# Patient Record
Sex: Female | Born: 1955
Health system: Southern US, Community
[De-identification: ages and names within clinical notes are randomized; demographics above are authoritative.]

## PROBLEM LIST (undated history)

## (undated) DIAGNOSIS — E785 Hyperlipidemia, unspecified: Secondary | ICD-10-CM

## (undated) DIAGNOSIS — I10 Essential (primary) hypertension: Secondary | ICD-10-CM

## (undated) DIAGNOSIS — K219 Gastro-esophageal reflux disease without esophagitis: Secondary | ICD-10-CM

## (undated) DIAGNOSIS — I313 Pericardial effusion (noninflammatory): Secondary | ICD-10-CM

## (undated) DIAGNOSIS — I350 Nonrheumatic aortic (valve) stenosis: Secondary | ICD-10-CM

## (undated) DIAGNOSIS — Z951 Presence of aortocoronary bypass graft: Secondary | ICD-10-CM

## (undated) DIAGNOSIS — I2510 Atherosclerotic heart disease of native coronary artery without angina pectoris: Secondary | ICD-10-CM

## (undated) DIAGNOSIS — I639 Cerebral infarction, unspecified: Secondary | ICD-10-CM

## (undated) DIAGNOSIS — Z952 Presence of prosthetic heart valve: Secondary | ICD-10-CM

## (undated) DIAGNOSIS — M109 Gout, unspecified: Secondary | ICD-10-CM

## (undated) DIAGNOSIS — I739 Peripheral vascular disease, unspecified: Secondary | ICD-10-CM

## (undated) DIAGNOSIS — J189 Pneumonia, unspecified organism: Secondary | ICD-10-CM

## (undated) DIAGNOSIS — I3139 Other pericardial effusion (noninflammatory): Secondary | ICD-10-CM

## (undated) DIAGNOSIS — Z9289 Personal history of other medical treatment: Secondary | ICD-10-CM

## (undated) DIAGNOSIS — E039 Hypothyroidism, unspecified: Secondary | ICD-10-CM

## (undated) DIAGNOSIS — Z9889 Other specified postprocedural states: Secondary | ICD-10-CM

## (undated) DIAGNOSIS — K922 Gastrointestinal hemorrhage, unspecified: Secondary | ICD-10-CM

## (undated) DIAGNOSIS — R011 Cardiac murmur, unspecified: Secondary | ICD-10-CM

## (undated) DIAGNOSIS — D509 Iron deficiency anemia, unspecified: Secondary | ICD-10-CM

## (undated) DIAGNOSIS — R0600 Dyspnea, unspecified: Secondary | ICD-10-CM

## (undated) DIAGNOSIS — K8020 Calculus of gallbladder without cholecystitis without obstruction: Secondary | ICD-10-CM

## (undated) DIAGNOSIS — Z87442 Personal history of urinary calculi: Secondary | ICD-10-CM

## (undated) DIAGNOSIS — Z7901 Long term (current) use of anticoagulants: Secondary | ICD-10-CM

## (undated) DIAGNOSIS — R7881 Bacteremia: Secondary | ICD-10-CM

## (undated) HISTORY — DX: Nonrheumatic aortic (valve) stenosis: I35.0

## (undated) HISTORY — DX: Cerebral infarction, unspecified: I63.9

## (undated) HISTORY — DX: Essential (primary) hypertension: I10

## (undated) HISTORY — DX: Other pericardial effusion (noninflammatory): I31.39

## (undated) HISTORY — PX: LUMBAR FUSION: SHX111

## (undated) HISTORY — DX: Presence of prosthetic heart valve: Z95.2

## (undated) HISTORY — DX: Hyperlipidemia, unspecified: E78.5

## (undated) HISTORY — DX: Gastrointestinal hemorrhage, unspecified: K92.2

## (undated) HISTORY — DX: Pericardial effusion (noninflammatory): I31.3

## (undated) HISTORY — DX: Long term (current) use of anticoagulants: Z79.01

## (undated) HISTORY — DX: Atherosclerotic heart disease of native coronary artery without angina pectoris: I25.10

## (undated) HISTORY — PX: BACK SURGERY: SHX140

## (undated) HISTORY — PX: CARDIAC VALVE REPLACEMENT: SHX585

## (undated) HISTORY — PX: TONSILLECTOMY: SUR1361

## (undated) HISTORY — PX: CARDIAC CATHETERIZATION: SHX172

## (undated) HISTORY — DX: Presence of aortocoronary bypass graft: Z95.1

## (undated) HISTORY — DX: Calculus of gallbladder without cholecystitis without obstruction: K80.20

---
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
## 1998-01-31 ENCOUNTER — Encounter: Admission: RE | Admit: 1998-01-31 | Discharge: 1998-01-31 | Payer: Self-pay | Admitting: Obstetrics

## 1998-02-05 ENCOUNTER — Ambulatory Visit (HOSPITAL_COMMUNITY): Admission: RE | Admit: 1998-02-05 | Discharge: 1998-02-05 | Payer: Self-pay | Admitting: Obstetrics

## 1998-02-28 ENCOUNTER — Encounter: Admission: RE | Admit: 1998-02-28 | Discharge: 1998-02-28 | Payer: Self-pay | Admitting: Obstetrics

## 2001-03-31 ENCOUNTER — Encounter: Payer: Self-pay | Admitting: Emergency Medicine

## 2001-03-31 ENCOUNTER — Emergency Department (HOSPITAL_COMMUNITY): Admission: EM | Admit: 2001-03-31 | Discharge: 2001-03-31 | Payer: Self-pay | Admitting: Emergency Medicine

## 2004-07-13 DIAGNOSIS — I739 Peripheral vascular disease, unspecified: Secondary | ICD-10-CM

## 2004-07-13 DIAGNOSIS — I639 Cerebral infarction, unspecified: Secondary | ICD-10-CM

## 2004-07-13 HISTORY — PX: AORTIC VALVE REPLACEMENT: SHX41

## 2004-07-13 HISTORY — DX: Cerebral infarction, unspecified: I63.9

## 2004-07-13 HISTORY — DX: Peripheral vascular disease, unspecified: I73.9

## 2004-07-13 HISTORY — PX: CORONARY ARTERY BYPASS GRAFT: SHX141

## 2004-11-05 ENCOUNTER — Ambulatory Visit: Payer: Self-pay | Admitting: Cardiology

## 2004-11-05 ENCOUNTER — Inpatient Hospital Stay (HOSPITAL_COMMUNITY): Admission: EM | Admit: 2004-11-05 | Discharge: 2004-11-11 | Payer: Self-pay | Admitting: Emergency Medicine

## 2004-11-06 ENCOUNTER — Encounter (INDEPENDENT_AMBULATORY_CARE_PROVIDER_SITE_OTHER): Payer: Self-pay | Admitting: *Deleted

## 2004-11-07 ENCOUNTER — Encounter: Payer: Self-pay | Admitting: Cardiology

## 2004-11-12 ENCOUNTER — Ambulatory Visit: Payer: Self-pay | Admitting: *Deleted

## 2004-11-17 ENCOUNTER — Ambulatory Visit: Payer: Self-pay | Admitting: *Deleted

## 2004-11-20 ENCOUNTER — Encounter: Admission: RE | Admit: 2004-11-20 | Discharge: 2004-11-27 | Payer: Self-pay | Admitting: Neurology

## 2004-12-01 ENCOUNTER — Ambulatory Visit: Payer: Self-pay | Admitting: Cardiology

## 2004-12-10 ENCOUNTER — Ambulatory Visit: Payer: Self-pay | Admitting: Cardiology

## 2004-12-28 ENCOUNTER — Emergency Department (HOSPITAL_COMMUNITY): Admission: EM | Admit: 2004-12-28 | Discharge: 2004-12-28 | Payer: Self-pay | Admitting: Emergency Medicine

## 2005-01-09 ENCOUNTER — Ambulatory Visit: Payer: Self-pay | Admitting: Cardiology

## 2005-01-27 ENCOUNTER — Ambulatory Visit: Payer: Self-pay | Admitting: Cardiology

## 2005-03-10 ENCOUNTER — Encounter: Admission: AD | Admit: 2005-03-10 | Discharge: 2005-03-10 | Payer: Self-pay | Admitting: Dentistry

## 2005-03-10 ENCOUNTER — Ambulatory Visit: Payer: Self-pay | Admitting: Dentistry

## 2005-03-12 ENCOUNTER — Encounter: Admission: RE | Admit: 2005-03-12 | Discharge: 2005-03-12 | Payer: Self-pay | Admitting: Cardiothoracic Surgery

## 2005-03-13 ENCOUNTER — Ambulatory Visit: Payer: Self-pay | Admitting: Cardiology

## 2005-03-17 ENCOUNTER — Ambulatory Visit: Payer: Self-pay | Admitting: Cardiology

## 2005-03-20 ENCOUNTER — Ambulatory Visit: Payer: Self-pay | Admitting: Cardiology

## 2005-03-23 ENCOUNTER — Inpatient Hospital Stay (HOSPITAL_COMMUNITY): Admission: AD | Admit: 2005-03-23 | Discharge: 2005-04-10 | Payer: Self-pay | Admitting: Surgery

## 2005-03-23 ENCOUNTER — Ambulatory Visit: Payer: Self-pay | Admitting: Cardiology

## 2005-03-23 ENCOUNTER — Ambulatory Visit: Payer: Self-pay | Admitting: Dentistry

## 2005-03-25 ENCOUNTER — Encounter (INDEPENDENT_AMBULATORY_CARE_PROVIDER_SITE_OTHER): Payer: Self-pay | Admitting: *Deleted

## 2005-03-31 ENCOUNTER — Encounter: Payer: Self-pay | Admitting: Cardiology

## 2005-04-20 ENCOUNTER — Ambulatory Visit: Payer: Self-pay | Admitting: Nurse Practitioner

## 2005-04-29 ENCOUNTER — Ambulatory Visit: Payer: Self-pay | Admitting: Dentistry

## 2005-05-21 ENCOUNTER — Ambulatory Visit: Payer: Self-pay | Admitting: Cardiology

## 2005-05-25 ENCOUNTER — Ambulatory Visit: Payer: Self-pay | Admitting: Dentistry

## 2005-06-03 ENCOUNTER — Ambulatory Visit: Payer: Self-pay | Admitting: Cardiology

## 2005-06-17 ENCOUNTER — Ambulatory Visit: Payer: Self-pay | Admitting: *Deleted

## 2005-07-01 ENCOUNTER — Ambulatory Visit: Payer: Self-pay | Admitting: Dentistry

## 2005-07-03 ENCOUNTER — Ambulatory Visit: Payer: Self-pay | Admitting: Internal Medicine

## 2005-07-20 ENCOUNTER — Ambulatory Visit: Payer: Self-pay | Admitting: Cardiology

## 2005-07-22 ENCOUNTER — Ambulatory Visit: Payer: Self-pay | Admitting: Dentistry

## 2005-08-10 ENCOUNTER — Ambulatory Visit: Payer: Self-pay | Admitting: Cardiology

## 2005-08-20 ENCOUNTER — Ambulatory Visit: Payer: Self-pay | Admitting: *Deleted

## 2005-08-28 ENCOUNTER — Ambulatory Visit: Payer: Self-pay | Admitting: Cardiovascular Disease

## 2005-08-28 ENCOUNTER — Ambulatory Visit: Payer: Self-pay | Admitting: Cardiology

## 2005-09-11 ENCOUNTER — Encounter: Payer: Self-pay | Admitting: Cardiology

## 2005-09-11 ENCOUNTER — Ambulatory Visit: Payer: Self-pay | Admitting: Internal Medicine

## 2005-09-11 ENCOUNTER — Ambulatory Visit: Payer: Self-pay

## 2005-09-14 ENCOUNTER — Ambulatory Visit: Payer: Self-pay | Admitting: Dentistry

## 2005-09-18 ENCOUNTER — Ambulatory Visit: Payer: Self-pay | Admitting: Internal Medicine

## 2005-10-05 ENCOUNTER — Ambulatory Visit: Payer: Self-pay | Admitting: Internal Medicine

## 2005-10-26 ENCOUNTER — Ambulatory Visit: Payer: Self-pay | Admitting: Internal Medicine

## 2005-11-23 ENCOUNTER — Ambulatory Visit: Payer: Self-pay | Admitting: Cardiology

## 2005-12-14 ENCOUNTER — Ambulatory Visit: Payer: Self-pay | Admitting: Cardiology

## 2005-12-25 ENCOUNTER — Ambulatory Visit: Payer: Self-pay | Admitting: Cardiology

## 2006-01-15 ENCOUNTER — Ambulatory Visit: Payer: Self-pay | Admitting: Internal Medicine

## 2006-01-19 ENCOUNTER — Ambulatory Visit: Payer: Self-pay | Admitting: Dentistry

## 2006-01-28 ENCOUNTER — Ambulatory Visit: Payer: Self-pay | Admitting: Cardiology

## 2006-01-28 ENCOUNTER — Other Ambulatory Visit: Admission: RE | Admit: 2006-01-28 | Discharge: 2006-01-28 | Payer: Self-pay | Admitting: Internal Medicine

## 2006-02-15 ENCOUNTER — Encounter: Admission: RE | Admit: 2006-02-15 | Discharge: 2006-02-15 | Payer: Self-pay | Admitting: Internal Medicine

## 2006-02-15 ENCOUNTER — Ambulatory Visit: Payer: Self-pay | Admitting: Internal Medicine

## 2006-03-16 ENCOUNTER — Ambulatory Visit: Payer: Self-pay | Admitting: Cardiovascular Disease

## 2006-04-13 ENCOUNTER — Ambulatory Visit: Payer: Self-pay | Admitting: *Deleted

## 2006-05-11 ENCOUNTER — Ambulatory Visit: Payer: Self-pay | Admitting: Cardiology

## 2006-06-07 ENCOUNTER — Ambulatory Visit: Payer: Self-pay | Admitting: Cardiology

## 2006-07-02 ENCOUNTER — Ambulatory Visit: Payer: Self-pay | Admitting: Cardiovascular Disease

## 2006-07-30 ENCOUNTER — Ambulatory Visit: Payer: Self-pay | Admitting: Cardiovascular Disease

## 2006-08-30 ENCOUNTER — Ambulatory Visit: Payer: Self-pay | Admitting: Internal Medicine

## 2006-09-20 ENCOUNTER — Ambulatory Visit: Payer: Self-pay | Admitting: Cardiology

## 2006-10-18 ENCOUNTER — Ambulatory Visit: Payer: Self-pay | Admitting: Cardiology

## 2006-11-15 ENCOUNTER — Ambulatory Visit: Payer: Self-pay | Admitting: Cardiovascular Disease

## 2006-12-17 ENCOUNTER — Ambulatory Visit: Payer: Self-pay | Admitting: Cardiology

## 2007-01-17 ENCOUNTER — Ambulatory Visit: Payer: Self-pay | Admitting: Cardiovascular Disease

## 2007-01-31 ENCOUNTER — Ambulatory Visit: Payer: Self-pay | Admitting: Cardiology

## 2007-02-28 ENCOUNTER — Ambulatory Visit: Payer: Self-pay | Admitting: Cardiology

## 2007-03-03 ENCOUNTER — Encounter: Admission: RE | Admit: 2007-03-03 | Discharge: 2007-03-03 | Payer: Self-pay | Admitting: Internal Medicine

## 2007-03-17 ENCOUNTER — Encounter: Admission: RE | Admit: 2007-03-17 | Discharge: 2007-03-17 | Payer: Self-pay | Admitting: Internal Medicine

## 2007-03-28 ENCOUNTER — Ambulatory Visit: Payer: Self-pay | Admitting: Internal Medicine

## 2007-04-25 ENCOUNTER — Ambulatory Visit: Payer: Self-pay | Admitting: Cardiology

## 2007-05-23 ENCOUNTER — Ambulatory Visit: Payer: Self-pay | Admitting: Internal Medicine

## 2007-06-20 ENCOUNTER — Ambulatory Visit: Payer: Self-pay | Admitting: Internal Medicine

## 2007-07-18 ENCOUNTER — Ambulatory Visit: Payer: Self-pay | Admitting: Cardiology

## 2007-08-15 ENCOUNTER — Ambulatory Visit: Payer: Self-pay | Admitting: Internal Medicine

## 2007-09-15 ENCOUNTER — Ambulatory Visit: Payer: Self-pay | Admitting: Cardiovascular Disease

## 2007-10-03 ENCOUNTER — Encounter: Admission: RE | Admit: 2007-10-03 | Discharge: 2007-10-03 | Payer: Self-pay | Admitting: Internal Medicine

## 2007-10-03 ENCOUNTER — Ambulatory Visit: Payer: Self-pay | Admitting: Cardiology

## 2007-10-31 ENCOUNTER — Ambulatory Visit: Payer: Self-pay | Admitting: Cardiology

## 2007-11-28 ENCOUNTER — Ambulatory Visit: Payer: Self-pay | Admitting: Cardiology

## 2007-12-19 ENCOUNTER — Ambulatory Visit: Payer: Self-pay | Admitting: Cardiology

## 2008-01-25 ENCOUNTER — Ambulatory Visit: Payer: Self-pay | Admitting: Cardiology

## 2008-02-22 ENCOUNTER — Ambulatory Visit: Payer: Self-pay | Admitting: Cardiology

## 2008-03-14 ENCOUNTER — Ambulatory Visit: Payer: Self-pay | Admitting: Internal Medicine

## 2008-04-11 ENCOUNTER — Ambulatory Visit: Payer: Self-pay | Admitting: Cardiology

## 2008-04-25 ENCOUNTER — Encounter: Payer: Self-pay | Admitting: Cardiology

## 2008-04-25 ENCOUNTER — Ambulatory Visit: Payer: Self-pay

## 2008-04-25 ENCOUNTER — Encounter: Admission: RE | Admit: 2008-04-25 | Discharge: 2008-04-25 | Payer: Self-pay | Admitting: Internal Medicine

## 2008-05-09 ENCOUNTER — Ambulatory Visit: Payer: Self-pay | Admitting: Cardiology

## 2008-06-11 ENCOUNTER — Ambulatory Visit: Payer: Self-pay | Admitting: Cardiovascular Disease

## 2008-07-09 ENCOUNTER — Ambulatory Visit: Payer: Self-pay | Admitting: Cardiology

## 2008-09-12 ENCOUNTER — Ambulatory Visit: Payer: Self-pay | Admitting: Internal Medicine

## 2008-10-15 ENCOUNTER — Ambulatory Visit: Payer: Self-pay | Admitting: Cardiology

## 2008-11-12 ENCOUNTER — Ambulatory Visit: Payer: Self-pay | Admitting: Cardiology

## 2008-12-11 ENCOUNTER — Encounter: Payer: Self-pay | Admitting: *Deleted

## 2008-12-11 ENCOUNTER — Ambulatory Visit: Payer: Self-pay | Admitting: Cardiology

## 2008-12-11 LAB — CONVERTED CEMR LAB: POC INR: 2.2

## 2009-01-10 ENCOUNTER — Ambulatory Visit: Payer: Self-pay | Admitting: Cardiology

## 2009-01-16 ENCOUNTER — Encounter: Payer: Self-pay | Admitting: *Deleted

## 2009-01-31 ENCOUNTER — Other Ambulatory Visit: Admission: RE | Admit: 2009-01-31 | Discharge: 2009-01-31 | Payer: Self-pay | Admitting: Internal Medicine

## 2009-02-04 ENCOUNTER — Encounter: Payer: Self-pay | Admitting: Cardiology

## 2009-03-11 ENCOUNTER — Encounter (INDEPENDENT_AMBULATORY_CARE_PROVIDER_SITE_OTHER): Payer: Self-pay | Admitting: *Deleted

## 2009-03-15 ENCOUNTER — Ambulatory Visit: Payer: Self-pay | Admitting: Internal Medicine

## 2009-04-12 ENCOUNTER — Ambulatory Visit: Payer: Self-pay | Admitting: Internal Medicine

## 2009-04-12 LAB — CONVERTED CEMR LAB: POC INR: 2.3

## 2009-04-23 ENCOUNTER — Encounter: Payer: Self-pay | Admitting: Cardiology

## 2009-04-24 ENCOUNTER — Ambulatory Visit: Payer: Self-pay | Admitting: Cardiology

## 2009-04-26 ENCOUNTER — Encounter: Admission: RE | Admit: 2009-04-26 | Discharge: 2009-04-26 | Payer: Self-pay | Admitting: Internal Medicine

## 2009-05-10 ENCOUNTER — Ambulatory Visit: Payer: Self-pay | Admitting: Internal Medicine

## 2009-06-07 ENCOUNTER — Ambulatory Visit: Payer: Self-pay | Admitting: Internal Medicine

## 2009-06-07 LAB — CONVERTED CEMR LAB: POC INR: 2.3

## 2009-07-08 ENCOUNTER — Ambulatory Visit: Payer: Self-pay | Admitting: Internal Medicine

## 2009-07-08 ENCOUNTER — Encounter (INDEPENDENT_AMBULATORY_CARE_PROVIDER_SITE_OTHER): Payer: Self-pay | Admitting: Cardiology

## 2009-07-08 LAB — CONVERTED CEMR LAB: POC INR: 2.2

## 2009-08-05 ENCOUNTER — Ambulatory Visit: Payer: Self-pay | Admitting: Cardiology

## 2009-08-05 LAB — CONVERTED CEMR LAB: POC INR: 2.1

## 2009-09-02 ENCOUNTER — Ambulatory Visit: Payer: Self-pay | Admitting: Internal Medicine

## 2009-09-02 LAB — CONVERTED CEMR LAB: POC INR: 1.8

## 2009-09-23 ENCOUNTER — Ambulatory Visit: Payer: Self-pay | Admitting: Internal Medicine

## 2009-09-23 LAB — CONVERTED CEMR LAB: POC INR: 2.1

## 2009-10-21 ENCOUNTER — Ambulatory Visit: Payer: Self-pay | Admitting: Cardiovascular Disease

## 2009-11-18 ENCOUNTER — Ambulatory Visit: Payer: Self-pay | Admitting: Cardiology

## 2009-12-16 ENCOUNTER — Ambulatory Visit: Payer: Self-pay | Admitting: Cardiology

## 2010-01-17 ENCOUNTER — Ambulatory Visit: Payer: Self-pay | Admitting: Cardiology

## 2010-02-28 ENCOUNTER — Ambulatory Visit: Payer: Self-pay | Admitting: Cardiology

## 2010-03-28 ENCOUNTER — Ambulatory Visit: Payer: Self-pay | Admitting: Cardiology

## 2010-04-16 ENCOUNTER — Telehealth: Payer: Self-pay | Admitting: Cardiology

## 2010-04-16 ENCOUNTER — Ambulatory Visit: Payer: Self-pay | Admitting: Cardiology

## 2010-04-16 ENCOUNTER — Encounter: Payer: Self-pay | Admitting: Cardiology

## 2010-04-16 DIAGNOSIS — R0602 Shortness of breath: Secondary | ICD-10-CM

## 2010-04-28 ENCOUNTER — Encounter: Admission: RE | Admit: 2010-04-28 | Discharge: 2010-04-28 | Payer: Self-pay | Admitting: Internal Medicine

## 2010-04-29 ENCOUNTER — Ambulatory Visit: Payer: Self-pay | Admitting: Cardiology

## 2010-04-29 ENCOUNTER — Encounter: Payer: Self-pay | Admitting: Cardiology

## 2010-04-29 ENCOUNTER — Ambulatory Visit: Payer: Self-pay | Admitting: Internal Medicine

## 2010-04-29 ENCOUNTER — Ambulatory Visit (HOSPITAL_COMMUNITY): Admission: RE | Admit: 2010-04-29 | Discharge: 2010-04-29 | Payer: Self-pay | Admitting: Cardiology

## 2010-04-29 ENCOUNTER — Ambulatory Visit: Payer: Self-pay

## 2010-04-29 LAB — CONVERTED CEMR LAB: POC INR: 2.7

## 2010-05-05 ENCOUNTER — Telehealth: Payer: Self-pay | Admitting: Cardiology

## 2010-05-16 ENCOUNTER — Encounter: Payer: Self-pay | Admitting: Cardiology

## 2010-05-19 ENCOUNTER — Ambulatory Visit: Payer: Self-pay | Admitting: Cardiology

## 2010-05-20 ENCOUNTER — Encounter: Payer: Self-pay | Admitting: Cardiology

## 2010-05-30 ENCOUNTER — Ambulatory Visit: Payer: Self-pay | Admitting: Cardiology

## 2010-05-30 LAB — CONVERTED CEMR LAB: POC INR: 3

## 2010-06-27 ENCOUNTER — Ambulatory Visit: Payer: Self-pay | Admitting: Cardiology

## 2010-07-25 ENCOUNTER — Ambulatory Visit: Admission: RE | Admit: 2010-07-25 | Discharge: 2010-07-25 | Payer: Self-pay | Source: Home / Self Care

## 2010-08-04 ENCOUNTER — Encounter: Payer: Self-pay | Admitting: Internal Medicine

## 2010-08-12 NOTE — Medication Information (Signed)
Summary: rov/ewj  Anticoagulant Therapy  Managed by: Weston Brass, PharmD Referring MD: Shawnie Pons MD PCP: Nila Nephew, MD Supervising MD: Riley Kill MD, Maisie Fus Indication 1: CVA-stroke (ICD-436) Lab Used: [facility] [place] Site: [facility] INR POC 2.5 INR RANGE 2 - 3  Dietary changes: no    Health status changes: no    Bleeding/hemorrhagic complications: no    Recent/future hospitalizations: no    Any changes in medication regimen? no    Recent/future dental: no  Any missed doses?: no       Is patient compliant with meds? yes       Allergies: No Known Drug Allergies  Anticoagulation Management History:      The patient is taking warfarin and comes in today for a routine follow up visit.  Positive risk factors for bleeding include history of CVA/TIA.  Negative risk factors for bleeding include an age less than 71 years old.  The bleeding index is 'intermediate risk'.  Positive CHADS2 values include History of HTN and Prior Stroke/CVA/TIA.  Negative CHADS2 values include Age > 40 years old.  The start date was 11/07/2004.  Her last INR was 4.0 RATIO.  Anticoagulation responsible provider: Riley Kill MD, Maisie Fus.  INR POC: 2.5.  Cuvette Lot#: [id number].  Exp: 12/2010.    Anticoagulation Management Assessment/Plan:      The patient's current anticoagulation dose is Coumadin 3 mg tabs: Take as directed by Anticoagulation Clinic.  The target INR is 2 - 3.  The next INR is due 12/16/2009.  Anticoagulation instructions were given to patient.  Results were reviewed/authorized by Weston Brass, PharmD.  She was notified by Weston Brass PharmD.         Prior Anticoagulation Instructions: INR 2.2  Continue on same dosage 3mg  daily except 4.5mg  on Mondays, Wednesdays, and Fridays.  Recheck in 4 weeks.    Current Anticoagulation Instructions: INR 2.5  Continue same dose of 1 tablet every day except 1 1/2 tablets on Monday, Wednesday and Friday  Prescriptions: COUMADIN 3 MG TABS (WARFARIN SODIUM)  Take as directed by Anticoagulation Clinic  #45 Tablet x 3   Entered by:   Weston Brass PharmD   Authorized by:   Talitha Givens, MD, [facility]   Signed by:   Weston Brass PharmD on 11/18/2009   Method used:   [facility] [phone]* (retail)       [address]       [place], [place]  [id number]       Ph: [phone] or [phone]       Fax: [phone]   RxID:   [id number]

## 2010-08-12 NOTE — Miscellaneous (Signed)
  Clinical Lists Changes  Observations: Added new observation of PAST MED HX:  COUMADIN THERAPY (ICD-V58.61) HYPERTENSION (ICD-401.9) DYSLIPIDEMIA (ICD-272.4) CVA (ICD-434.91)..related to thrombosed aortic root aneurysm CORONARY ARTERY BYPASS GRAFT, HX OF (ICD-V45.81).Marland KitchenLIMA to LAD..SVG to Dx..03/2005 CAD (ICD-414.00) AORTIC VALVE REPLACEMENT, HX OF (ICD-V43.3)...03/2005.Marland KitchenMarland KitchenMarland KitchenBentall procedure.(.#21 [employer] mechanical valve conduit with re-implantation of coronaries.).Marland Kitchen / ..good valve function ..echo..04/2008  /   mechanical valve working well....echo...04/2010 AORTIC STENOSIS (ICD-424.1) EF  60%...echo  04/2008  /   EF 55-60%...echo...04/29/2010... Indigestion... October, 2011 Shortness of breath (05/16/2010 13:58) Added new observation of PRIMARY MD: Nila Nephew, MD (05/16/2010 13:58)       Past History:  Past Medical History:  COUMADIN THERAPY (ICD-V58.61) HYPERTENSION (ICD-401.9) DYSLIPIDEMIA (ICD-272.4) CVA (ICD-434.91)..related to thrombosed aortic root aneurysm CORONARY ARTERY BYPASS GRAFT, HX OF (ICD-V45.81).Marland KitchenLIMA to LAD..SVG to Dx..03/2005 CAD (ICD-414.00) AORTIC VALVE REPLACEMENT, HX OF (ICD-V43.3)...03/2005.Marland KitchenMarland KitchenMarland KitchenBentall procedure.(.#21 [employer] Jude mechanical valve conduit with re-implantation of coronaries.).Marland Kitchen / ..good valve function ..echo..04/2008  /   mechanical valve working well....echo...04/2010 AORTIC STENOSIS (ICD-424.1) EF  60%...echo  04/2008  /   EF 55-60%...echo...04/29/2010... Indigestion... October, 2011 Shortness of breath

## 2010-08-12 NOTE — Medication Information (Signed)
Summary: rov/sp  Anticoagulant Therapy  Managed by: Weston Brass, PharmD Referring MD: Shawnie Pons MD PCP: Nila Nephew, MD Supervising MD: Antoine Poche MD, Fayrene Fearing Indication 1: CVA-stroke (ICD-436) Lab Used: [facility] [place] Site: [facility] INR POC 1.8 INR RANGE 2 - 3  Dietary changes: yes       Details: increased salads; pt would like to continue eating salads throughout the summer  Health status changes: no    Bleeding/hemorrhagic complications: no    Recent/future hospitalizations: no    Any changes in medication regimen? no    Recent/future dental: no  Any missed doses?: no       Is patient compliant with meds? yes       Allergies: No Known Drug Allergies  Anticoagulation Management History:      The patient is taking warfarin and comes in today for a routine follow up visit.  Positive risk factors for bleeding include history of CVA/TIA.  Negative risk factors for bleeding include an age less than 79 years old.  The bleeding index is 'intermediate risk'.  Positive CHADS2 values include History of HTN and Prior Stroke/CVA/TIA.  Negative CHADS2 values include Age > 4 years old.  The start date was 11/07/2004.  Her last INR was 4.0 RATIO.  Anticoagulation responsible provider: Antoine Poche MD, Fayrene Fearing.  INR POC: 1.8.  Cuvette Lot#: [id number].  Exp: 02/2011.    Anticoagulation Management Assessment/Plan:      The patient's current anticoagulation dose is Coumadin 3 mg tabs: Take as directed by Anticoagulation Clinic.  The target INR is 2 - 3.  The next INR is due 01/17/2010.  Anticoagulation instructions were given to patient.  Results were reviewed/authorized by Weston Brass, PharmD.  She was notified by Weston Brass PharmD.         Prior Anticoagulation Instructions: INR 2.5  Continue same dose of 1 tablet every day except 1 1/2 tablets on Monday, Wednesday and Friday   Current Anticoagulation Instructions: INR 1.8  Take 1 1/2 tablets today then increase dose to 1 1/2 tablets every  day except 1 tablet on Monday, Wednesday and Friday

## 2010-08-12 NOTE — Medication Information (Signed)
Summary: ccr  Anticoagulant Therapy  Managed by: Weston Brass, PharmD Referring MD: Shawnie Pons MD PCP: Nila Nephew, MD Supervising MD: Riley Kill MD, Maisie Fus Indication 1: CVA-stroke (ICD-436) Lab Used: [facility] [place] Site: [facility] INR POC 2.4 INR RANGE 2 - 3  Dietary changes: no    Health status changes: no    Bleeding/hemorrhagic complications: no    Recent/future hospitalizations: no    Any changes in medication regimen? no    Recent/future dental: no  Any missed doses?: no       Is patient compliant with meds? yes       Allergies: No Known Drug Allergies  Anticoagulation Management History:      The patient is taking warfarin and comes in today for a routine follow up visit.  Positive risk factors for bleeding include history of CVA/TIA.  Negative risk factors for bleeding include an age less than 89 years old.  The bleeding index is 'intermediate risk'.  Positive CHADS2 values include History of HTN and Prior Stroke/CVA/TIA.  Negative CHADS2 values include Age > 76 years old.  The start date was 11/07/2004.  Her last INR was 4.0 RATIO.  Anticoagulation responsible provider: Riley Kill MD, Maisie Fus.  INR POC: 2.4.  Cuvette Lot#: [id number].  Exp: 03/2011.    Anticoagulation Management Assessment/Plan:      The patient's current anticoagulation dose is Coumadin 3 mg tabs: Take as directed by Anticoagulation Clinic.  The target INR is 2 - 3.  The next INR is due 03/28/2010.  Anticoagulation instructions were given to patient.  Results were reviewed/authorized by Weston Brass, PharmD.  She was notified by Gweneth Fritter, PharmD Candidate.         Prior Anticoagulation Instructions: INR 2.0  Continue 4.5mg s everyday except 3mg s on Mondays, Wednesdays, and Fridays. Recheck in 4 weeks.   Current Anticoagulation Instructions: INR 2.4  Continue taking 1.5 tablets (4.5mg )  every day except take 1 tablet (3mg ) on Mondays, Wednesdays, and Fridays.  Recheck in 4 weeks .

## 2010-08-12 NOTE — Medication Information (Signed)
Summary: rov/sp  Anticoagulant Therapy  Managed by: Bethena Midget, RN, BSN Referring MD: Shawnie Pons MD PCP: Nila Nephew, MD Supervising MD: Antoine Poche MD, Fayrene Fearing Indication 1: CVA-stroke (ICD-436) Lab Used: [facility] Site: [facility] INR POC 2.0 INR RANGE 2 - 3  Dietary changes: no    Health status changes: no    Bleeding/hemorrhagic complications: no    Recent/future hospitalizations: no    Any changes in medication regimen? no    Recent/future dental: no  Any missed doses?: no       Is patient compliant with meds? yes      Comments: Was eating more salads, but now she has decreased some of that intake.   Allergies: No Known Drug Allergies  Anticoagulation Management History:      The patient is taking warfarin and comes in today for a routine follow up visit.  Positive risk factors for bleeding include history of CVA/TIA.  Negative risk factors for bleeding include an age less than 1 years old.  The bleeding index is 'intermediate risk'.  Positive CHADS2 values include History of HTN and Prior Stroke/CVA/TIA.  Negative CHADS2 values include Age > 34 years old.  The start date was 11/07/2004.  Her last INR was 4.0 RATIO.  Anticoagulation responsible provider: Antoine Poche MD, Fayrene Fearing.  INR POC: 2.0.  Cuvette Lot#: [id number].  Exp: 03/2011.    Anticoagulation Management Assessment/Plan:      The patient's current anticoagulation dose is Coumadin 3 mg tabs: Take as directed by Anticoagulation Clinic.  The target INR is 2 - 3.  The next INR is due 02/14/2010.  Anticoagulation instructions were given to patient.  Results were reviewed/authorized by Bethena Midget, RN, BSN.  She was notified by Bethena Midget, RN, BSN.         Prior Anticoagulation Instructions: INR 1.8  Take 1 1/2 tablets today then increase dose to 1 1/2 tablets every day except 1 tablet on Monday, Wednesday and Friday   Current Anticoagulation Instructions: INR 2.0  Continue 4.5mg s everyday except 3mg s on  Mondays, Wednesdays, and Fridays. Recheck in 4 weeks.

## 2010-08-12 NOTE — Progress Notes (Signed)
Summary: INR Range 2.0-2.5  ---- Converted from flag ---- ---- 04/16/2010 11:53 AM, Meredith Staggers, RN wrote: Elvina Sidle Dr Myrtis Ser would like for her INR range to be b/t 2-2.5, thanks ------------------------------  Phone Note Other Incoming   Summary of Call: Changed INR target range from 2.0-3.0 to 2.0-2.5 per Dr Myrtis Ser secondary to ecchymoses. Initial call taken by: Cloyde Reams RN,  April 16, 2010 12:18 PM

## 2010-08-12 NOTE — Medication Information (Signed)
Summary: rov/jk  Anticoagulant Therapy  Managed by: Weston Brass, PharmD Referring MD: Shawnie Pons MD PCP: Nila Nephew, MD Supervising MD: Myrtis Ser MD, Tinnie Gens Indication 1: CVA-stroke (ICD-436) Lab Used: [facility] [place] Site: [facility] INR POC 2.5 INR RANGE 2 - 3  Dietary changes: no    Health status changes: no    Bleeding/hemorrhagic complications: no    Recent/future hospitalizations: no    Any changes in medication regimen? yes       Details: Has been having heartburn recently and is taking famotidine 10 mg daily  Recent/future dental: no  Any missed doses?: no       Is patient compliant with meds? yes       Allergies: No Known Drug Allergies  Anticoagulation Management History:      The patient is taking warfarin and comes in today for a routine follow up visit.  Positive risk factors for bleeding include history of CVA/TIA.  Negative risk factors for bleeding include an age less than 42 years old.  The bleeding index is 'intermediate risk'.  Positive CHADS2 values include History of HTN and Prior Stroke/CVA/TIA.  Negative CHADS2 values include Age > 99 years old.  The start date was 11/07/2004.  Her last INR was 4.0 RATIO.  Anticoagulation responsible provider: Myrtis Ser MD, Tinnie Gens.  INR POC: 2.5.  Cuvette Lot#: [id number].  Exp: 05/2011.    Anticoagulation Management Assessment/Plan:      The patient's current anticoagulation dose is Coumadin 3 mg tabs: Take as directed by Anticoagulation Clinic.  The target INR is 2 - 3.  The next INR is due 04/25/2010.  Anticoagulation instructions were given to patient.  Results were reviewed/authorized by Weston Brass, PharmD.  She was notified by Harrel Carina, PharmD candidate.         Prior Anticoagulation Instructions: INR 2.4  Continue taking 1.5 tablets (4.5mg )  every day except take 1 tablet (3mg ) on Mondays, Wednesdays, and Fridays.  Recheck in 4 weeks .  Current Anticoagulation Instructions: INR 2.5  Continue taking 1 1/2  tablets everyday except take 1 tablet on Mondays, Wednesdays, and Fridays. Re-check INR in 4 weeks.   Prescriptions: COUMADIN 3 MG TABS (WARFARIN SODIUM) Take as directed by Anticoagulation Clinic  #45 Tablet x 3   Entered by:   Cloyde Reams RN   Authorized by:   Talitha Givens, MD, [facility]   Signed by:   Cloyde Reams RN on 03/28/2010   Method used:   [facility]  [address] [phone][facility] (retail)       [address]       [place], [place]  [id number]       Ph: [phone] or [phone]       Fax: [phone]   RxID:   [id number]

## 2010-08-12 NOTE — Medication Information (Signed)
Summary: Coumadin Clinic   Anticoagulant Therapy  Managed by: Weston Brass, PharmD Referring MD: Shawnie Pons MD PCP: Nila Nephew, MD Supervising MD: Myrtis Ser MD, Tinnie Gens Indication 1: CVA-stroke (ICD-436) Lab Used: [facility] [place] Site: [facility] INR RANGE 2 - 3          Comments: Per Dr. Myrtis Ser, change INR range to 2.0-3.0 to help decrease number of visits to clinic despite bruising episodes.   Allergies: No Known Drug Allergies  Anticoagulation Management History:      Positive risk factors for bleeding include history of CVA/TIA.  Negative risk factors for bleeding include an age less than 36 years old.  The bleeding index is 'intermediate risk'.  Positive CHADS2 values include History of HTN and Prior Stroke/CVA/TIA.  Negative CHADS2 values include Age > 72 years old.  The start date was 11/07/2004.  Her last INR was 4.0 RATIO.  Anticoagulation responsible provider: Myrtis Ser MD, Tinnie Gens.  Exp: 05/2011.    Anticoagulation Management Assessment/Plan:      The patient's current anticoagulation dose is Coumadin 3 mg tabs: Take as directed by Anticoagulation Clinic.  The target INR is 2.0-3.0.  The next INR is due 05/27/2010.  Anticoagulation instructions were given to patient.  Results were reviewed/authorized by Weston Brass, PharmD.         Prior Anticoagulation Instructions: INR 2.7  Take 1 tablet today, then resume normal Coumadin schedule:  1 and 1/2 tablets every day of the week, except 1 tablet on Monday, Wednesday, and Friday.  Return to clinic in 4 weeks.

## 2010-08-12 NOTE — Medication Information (Signed)
Summary: rov..mp  Anticoagulant Therapy  Managed by: Shelby Dubin, PharmD, BCPS, CPP Referring MD: Shawnie Pons MD PCP: Nila Nephew, MD Supervising MD: Antoine Poche MD, Fayrene Fearing Indication 1: CVA-stroke (ICD-436) Lab Used: [facility] [place] Site: [facility] INR POC 2.1 INR RANGE 2 - 3  Dietary changes: no    Health status changes: no    Bleeding/hemorrhagic complications: no    Recent/future hospitalizations: no    Any changes in medication regimen? no    Recent/future dental: no  Any missed doses?: no       Is patient compliant with meds? yes       Allergies (verified): No Known Drug Allergies  Anticoagulation Management History:      The patient is taking warfarin and comes in today for a routine follow up visit.  Positive risk factors for bleeding include history of CVA/TIA.  Negative risk factors for bleeding include an age less than 6 years old.  The bleeding index is 'intermediate risk'.  Positive CHADS2 values include History of HTN and Prior Stroke/CVA/TIA.  Negative CHADS2 values include Age > 75 years old.  The start date was 11/07/2004.  Her last INR was 4.0 RATIO.  Anticoagulation responsible provider: Antoine Poche MD, Fayrene Fearing.  INR POC: 2.1.  Cuvette Lot#: [id number].  Exp: 10/2010.    Anticoagulation Management Assessment/Plan:      The patient's current anticoagulation dose is Coumadin 3 mg tabs: Take as directed by Anticoagulation Clinic.  The target INR is 2 - 3.  The next INR is due 09/02/2009.  Anticoagulation instructions were given to patient.  Results were reviewed/authorized by Shelby Dubin, PharmD, BCPS, CPP.  She was notified by Erie Noe D Candidate.         Prior Anticoagulation Instructions: INR 2.2  Continue 1 tab daily except 1.5 tabs on Mondays, Wednesdays, Fridays.   Recheck in 4 weeks.   Current Anticoagulation Instructions: INR: 2.1 Continue with same dosage of 3mg  tablet daily except 4.5mg  on Mondays, Wednesdays and Fridays Recheck in 4  weeks

## 2010-08-12 NOTE — Medication Information (Signed)
Summary: rov/eac  Anticoagulant Therapy  Managed by: Bethena Midget, RN, BSN Referring MD: Shawnie Pons MD PCP: Nila Nephew, MD Supervising MD: Tenny Craw MD, Gunnar Fusi Indication 1: CVA-stroke (ICD-436) Lab Used: [facility] Site: [facility] INR POC 2.1 INR RANGE 2 - 3  Dietary changes: no    Health status changes: no    Bleeding/hemorrhagic complications: no    Recent/future hospitalizations: no    Any changes in medication regimen? no    Recent/future dental: no  Any missed doses?: no       Is patient compliant with meds? yes       Allergies (verified): No Known Drug Allergies  Anticoagulation Management History:      The patient is taking warfarin and comes in today for a routine follow up visit.  Positive risk factors for bleeding include history of CVA/TIA.  Negative risk factors for bleeding include an age less than 70 years old.  The bleeding index is 'intermediate risk'.  Positive CHADS2 values include History of HTN and Prior Stroke/CVA/TIA.  Negative CHADS2 values include Age > 42 years old.  The start date was 11/07/2004.  Her last INR was 4.0 RATIO.  Anticoagulation responsible provider: Tenny Craw MD, Gunnar Fusi.  INR POC: 2.1.  Cuvette Lot#: [id number].  Exp: 11/2010.    Anticoagulation Management Assessment/Plan:      The patient's current anticoagulation dose is Coumadin 3 mg tabs: Take as directed by Anticoagulation Clinic.  The target INR is 2 - 3.  The next INR is due 10/21/2009.  Anticoagulation instructions were given to patient.  Results were reviewed/authorized by Bethena Midget, RN, BSN.  She was notified by Bethena Midget, RN, BSN.         Prior Anticoagulation Instructions: INR 1.8  Take 2 tablets today.  Then return to normal dosing schedule.  Take 1.5 tablets on Monday, Wednesday, and Friday and take 1 tablet all other days.  Return to clinic in 3 weeks.  Current Anticoagulation Instructions: INR 2.1 Continue 3mg s daily except 4.5mg s on Mondays, Wednesdays and  Fridays. Recheck in 4 weeks.

## 2010-08-12 NOTE — Medication Information (Signed)
Summary: rov/cs  Anticoagulant Therapy  Managed by: Eda Keys, PharmD Referring MD: Shawnie Pons MD PCP: Nila Nephew, MD Supervising MD: Antoine Poche MD, Fayrene Fearing Indication 1: CVA-stroke (ICD-436) Lab Used: [facility] Site: [facility] INR POC 3.0 INR RANGE 2 - 3  Dietary changes: no    Health status changes: no    Bleeding/hemorrhagic complications: no    Recent/future hospitalizations: no    Any changes in medication regimen? yes       Details: Pt has had a cold and has been taking benadryl for this.   Recent/future dental: no  Any missed doses?: no       Is patient compliant with meds? yes       Allergies: No Known Drug Allergies  Anticoagulation Management History:      The patient is taking warfarin and comes in today for a routine follow up visit.  Positive risk factors for bleeding include history of CVA/TIA.  Negative risk factors for bleeding include an age less than 41 years old.  The bleeding index is 'intermediate risk'.  Positive CHADS2 values include History of HTN and Prior Stroke/CVA/TIA.  Negative CHADS2 values include Age > 17 years old.  The start date was 11/07/2004.  Her last INR was 4.0 RATIO.  Anticoagulation responsible provider: Antoine Poche MD, Fayrene Fearing.  INR POC: 3.0.  Cuvette Lot#: [id number].  Exp: 06/2011.    Anticoagulation Management Assessment/Plan:      The patient's current anticoagulation dose is Coumadin 3 mg tabs: Take as directed by Anticoagulation Clinic.  The target INR is 2.0-3.0.  The next INR is due 06/27/2010.  Anticoagulation instructions were given to patient.  Results were reviewed/authorized by Eda Keys, PharmD.  She was notified by Eda Keys.         Prior Anticoagulation Instructions: INR 2.7  Take 1 tablet today, then resume normal Coumadin schedule:  1 and 1/2 tablets every day of the week, except 1 tablet on Monday, Wednesday, and Friday.  Return to clinic in 4 weeks.   Current Anticoagulation Instructions: INR  3.0  Take 1/2 tablet today.  Then return to normal dosing schedule of 1 tablet on Monday, WEdnesday, and Friday and 1.5 tablets all other days.  Return to clinic in 4 weeks.

## 2010-08-12 NOTE — Medication Information (Signed)
Summary: rov/jaj   Anticoagulant Therapy  Managed by: Weston Brass, PharmD Referring MD: Shawnie Pons MD PCP: Nila Nephew, MD Supervising MD: Myrtis Ser MD, Tinnie Gens Indication 1: CVA-stroke (ICD-436) Lab Used: [facility] [place] Site: [facility] INR POC 2.7 INR RANGE 2 - 3  Dietary changes: no    Health status changes: no    Bleeding/hemorrhagic complications: no    Recent/future hospitalizations: no    Any changes in medication regimen? yes       Details: Has started taking prilosec, stopped aspirin d/t bruising.   Recent/future dental: no  Any missed doses?: no       Is patient compliant with meds? yes       Allergies: No Known Drug Allergies  Anticoagulation Management History:      The patient is taking warfarin and comes in today for a routine follow up visit.  Positive risk factors for bleeding include history of CVA/TIA.  Negative risk factors for bleeding include an age less than 84 years old.  The bleeding index is 'intermediate risk'.  Positive CHADS2 values include History of HTN and Prior Stroke/CVA/TIA.  Negative CHADS2 values include Age > 17 years old.  The start date was 11/07/2004.  Her last INR was 4.0 RATIO.  Anticoagulation responsible provider: Myrtis Ser MD, Tinnie Gens.  INR POC: 2.7.  Cuvette Lot#: [id number].  Exp: 05/2011.    Anticoagulation Management Assessment/Plan:      The patient's current anticoagulation dose is Coumadin 3 mg tabs: Take as directed by Anticoagulation Clinic.  The target INR is 2.0-2.5.  The next INR is due 05/27/2010.  Anticoagulation instructions were given to patient.  Results were reviewed/authorized by Weston Brass, PharmD.  She was notified by Haynes Hoehn, PharmD Candidate.         Prior Anticoagulation Instructions: INR 2.5  Continue taking 1 1/2 tablets everyday except take 1 tablet on Mondays, Wednesdays, and Fridays. Re-check INR in 4 weeks.   Current Anticoagulation Instructions: INR 2.7  Take 1 tablet today, then resume normal  Coumadin schedule:  1 and 1/2 tablets every day of the week, except 1 tablet on Monday, Wednesday, and Friday.  Return to clinic in 4 weeks.

## 2010-08-12 NOTE — Assessment & Plan Note (Signed)
Summary: 4WK F/U SL  Medications Added OMEPRAZOLE 20 MG CPDR (OMEPRAZOLE) once daily      Allergies Added: NKDA  Visit Type:  Follow-up Primary Provider:  Nila Nephew, MD  CC:  shortness of breath.  History of Present Illness: The patient is seen for cardiology followup.  I saw her on April 16, 2010.  She was having some shortness of breath.  I was not convinced of any significant fluid overload.  Decision was made to proceed with followup 2-D echo to reassess LV function and her mechanical aortic valve replacement.  The echo shows that the valve is working very well.  Her ejection fraction is 55-60%.  I believe that her shortness of breath is not cardiac in origin.  It may well be from increased weight.  Patient also had excess bruising.  I stopped her aspirin and tightened her Coumadin range to be 2.0-2.5.  She increased bruisability.  Unfortunately the change in her INR range is leading to increased frequency of her Coumadin visits and we do not want this.  I'm comfortable changing her range back to 2.0-3.0. My preference would be to have her on aspirin with known coronary disease but we will hold the aspirin for now.  Current Medications (verified): 1)  Calcium 600/vitamin D 600-400 Mg-Unit Chew (Calcium Carbonate-Vitamin D) .... Take 1 By Mouth Bid 2)  Simvastatin 40 Mg Tabs (Simvastatin) .... Take One Tablet By Mouth Daily At Bedtime 3)  Halcion 0.25 Mg Tabs (Triazolam) .... By Mouth At Bedtime Prn 4)  Coumadin 3 Mg Tabs (Warfarin Sodium) .... Take As Directed By Anticoagulation Clinic 5)  Omeprazole 20 Mg Cpdr (Omeprazole) .... Once Daily 6)  Triamcinolone Acetonide 0.5 % Crea (Triamcinolone Acetonide) .... 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 Jude mechanical valve conduit with re-implantation of coronaries.).Marland Kitchen / ..good valve function ..echo..04/2008  /   mechanical valve working well....echo...04/2010 AORTIC STENOSIS (ICD-424.1) EF  60%...echo  04/2008  /   EF 55-60%...echo...04/29/2010... Indigestion... October, 2011 Shortness of breath Excess bruisability  Review of Systems       Patient denies fever, chills, headache, sweats, rash, change in vision, change in hearing, chest pain, cough, nausea vomitin urinary symptoms.  All of the systems are reviewed and are negative  Vital Signs:  Patient profile:   55 year old female Height:      62 inches Weight:      152 pounds BMI:     27.90 Pulse rate:   70 / minute BP sitting:   110 / 66  (left arm) Cuff size:   regular  Vitals Entered By: Hardin Negus, RMA (May 19, 2010 2:46 PM)  Physical Exam  General:  patient is stable today. Eyes:  no xanthelasma. Neck:  no jugular venous distention. Lungs:  lungs are clear.  Respiratory effort is not labored. Heart:  cardiac exam reveals S1-S2.  No clicks or significant murmurs. Abdomen:  abdomen is soft. Extremities:  patient has decreased bruising in her arms. Psych:  patient is oriented to person time and place.  Affect is normal.   Impression & Recommendations:  Problem # 1:  * ECCHYMOSES FROM COUMADIN AND ASPIRIN This is better off of aspirin.  For now she will remain  off aspirin but I will broaden her Coumadin range back from 2.0-3.0  Problem # 2:  SHORTNESS OF BREATH (ICD-786.05) I believe that her shortness of breath may be due to conditioning and her weight.  I have encouraged her to increase her activity.  I have chosen not to proceed with an ischemic workup at this time.  However this needs to be kept in mind.  Problem # 3:  HYPERTENSION (ICD-401.9) Blood  pressure is under good control.  No change in therapy.  Patient Instructions: 1)  Your physician wants you to follow-up in: 6 months  You will receive a reminder letter in the mail two months in advance. If you don't receive a letter, please call our office to schedule the follow-up appointment.

## 2010-08-12 NOTE — Medication Information (Signed)
Summary: rov/tm  Anticoagulant Therapy  Managed by: Cloyde Reams, RN, BSN Referring MD: Shawnie Pons MD PCP: Nila Nephew, MD Supervising MD: Clifton James MD, Cristal Deer Indication 1: CVA-stroke (ICD-436) Lab Used: [facility] Site: [facility] INR POC 2.2 INR RANGE 2 - 3  Dietary changes: no    Health status changes: no    Bleeding/hemorrhagic complications: no    Recent/future hospitalizations: no    Any changes in medication regimen? no    Recent/future dental: no  Any missed doses?: no       Is patient compliant with meds? yes       Allergies (verified): No Known Drug Allergies  Anticoagulation Management History:      The patient is taking warfarin and comes in today for a routine follow up visit.  Positive risk factors for bleeding include history of CVA/TIA.  Negative risk factors for bleeding include an age less than 68 years old.  The bleeding index is 'intermediate risk'.  Positive CHADS2 values include History of HTN and Prior Stroke/CVA/TIA.  Negative CHADS2 values include Age > 14 years old.  The start date was 11/07/2004.  Her last INR was 4.0 RATIO.  Anticoagulation responsible provider: Clifton James MD, Cristal Deer.  INR POC: 2.2.  Cuvette Lot#: [id number].  Exp: 11/2010.    Anticoagulation Management Assessment/Plan:      The patient's current anticoagulation dose is Coumadin 3 mg tabs: Take as directed by Anticoagulation Clinic.  The target INR is 2 - 3.  The next INR is due 11/18/2009.  Anticoagulation instructions were given to patient.  Results were reviewed/authorized by Cloyde Reams, RN, BSN.  She was notified by Cloyde Reams RN.         Prior Anticoagulation Instructions: INR 2.1 Continue 3mg s daily except 4.5mg s on Mondays, Wednesdays and Fridays. Recheck in 4 weeks.   Current Anticoagulation Instructions: INR 2.2  Continue on same dosage 3mg  daily except 4.5mg  on Mondays, Wednesdays, and Fridays.  Recheck in 4 weeks.

## 2010-08-12 NOTE — Progress Notes (Signed)
Summary: ECHO RESULTS   Phone Note Call from Patient   Caller: Patient [phone] Reason for Call: Talk to Nurse, Lab or Test Results Summary of Call: ECHO RESULTS Initial call taken by: Glynda Jaeger,  May 05, 2010 4:44 PM  Follow-up for Phone Call        Phone Call Completed PT AWARE OF ECHO RESULTS Follow-up by: Scherrie Bateman, LPN,  May 05, 2010 4:52 PM

## 2010-08-12 NOTE — Assessment & Plan Note (Signed)
Summary: per check out/sf  Medications Added FAMOTIDINE 10 MG TABS (FAMOTIDINE) as needed TRIAMCINOLONE ACETONIDE 0.5 % CREA (TRIAMCINOLONE ACETONIDE) at bedtime      Allergies Added: NKDA  Visit Type:  Follow-up Primary Provider:  Nila Nephew, MD  CC:  CAD and aortic valve replacement.  History of Present Illness: The patient is seen for followup of coronary artery disease and aortic valve replacement.  I saw her last October, 2010.  She had a Bental procedure in 2006.  She has an aortic root conduit with a St. Jude mechanical valve.  She is on Coumadin.  In addition because of coronary disease she is on aspirin.  Unfortunately with this combination, she is having worsening problems with ecchymoses.  She's not having any chest pain.  She is having some exertional shortness of breath.  She also has some indigestion.  Current Medications (verified): 1)  Calcium 600/vitamin D 600-400 Mg-Unit Chew (Calcium Carbonate-Vitamin D) .... Take 1 By Mouth Bid 2)  Aspirin 81 Mg Tbec (Aspirin) .... Take One Tablet By Mouth Daily 3)  Simvastatin 40 Mg Tabs (Simvastatin) .... Take One Tablet By Mouth Daily At Bedtime 4)  Halcion 0.25 Mg Tabs (Triazolam) .... By Mouth At Bedtime Prn 5)  Coumadin 3 Mg Tabs (Warfarin Sodium) .... Take As Directed By Anticoagulation Clinic 6)  Famotidine 10 Mg Tabs (Famotidine) .... As Needed 7)  Triamcinolone Acetonide 0.5 % Crea (Triamcinolone Acetonide) .... 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 Jude mechanical valve conduit with re-implantation of coronaries.).Marland Kitchen / ..good valve  function ..echo..04/2008 AORTIC STENOSIS (ICD-424.1) EF  60%...echo  04/2008 Indigestion... October, 2011 Shortness of breath  Review of Systems       Patient denies fever, chills, headache, sweats, rash, change in vision, change in hearing, chest pain, cough, nausea vomiting, urinary symptoms.  All other systems are reviewed and are negative.  Vital Signs:  Patient profile:   55 year old female Height:      62 inches Weight:      150 pounds BMI:     27.53 Pulse rate:   74 / minute BP sitting:   106 / 68  (left arm) Cuff size:   regular  Vitals Entered By: Hardin Negus, RMA (April 16, 2010 11:16 AM)  Physical Exam  General:  The patient is stable.  She has gained some weight. Head:  head is atraumatic. Eyes:  no xanthelasma. Neck:  no jugular venous distention. Chest Wall:  no chest wall tenderness. Lungs:  lungs are clear.  Respiratory effort is nonlabored. Heart:  cardiac exam reveals S1-S2.  No clicks or significant murmurs. There is crisp closure sound of her aortic prosthesis.  There is no aortic insufficiency. Abdomen:  abdomen is soft. Msk:  no musculoskeletal deformities. Extremities:  no peripheral edema. Skin:  patient has multiple areas of ecchymoses on her arms. Psych:  patient is oriented to person time and place.  Affect is normal.   Impression & Recommendations:  Problem # 1:  * ECCHYMOSES FROM COUMADIN AND ASPIRIN This has become a significant problem for the patient.  When she applies for work she thinks that the appearance of facts whether she can get the job.  She has ecchymoses with without any  significant trauma.  It would be optimal to have her on Coumadin and aspirin.  However with her current situation we will put her aspirin on hold.  We will also check to be sure that her INR is being kept close to 2.0.  Problem # 2:  SHORTNESS OF BREATH (ICD-786.05)  The following medications were removed from the medication list:    Aspirin 81 Mg Tbec  (Aspirin) .Marland Kitchen... Take one tablet by mouth daily The patient is having exertional shortness of breath.  There is no sign of congestive heart failure.  At this point there is no proof of ischemia.  We will proceed with a followup 2-D echo to be sure that her valve function is normal there has not been changing her left ventricular function.  I will then see her back to decide if any further evaluation is needed.  Orders: Echocardiogram (Echo)  Problem # 3:  * INDIGESTION  OCTOBER, 2011 At this point I suspect that her indigestion is GERD and not ischemia.  She has been taking some GI medicines on an intermittent basis.  I encouraged her to take it daily until she sees Dr. Chilton Si for further evaluation.  I also reminded her to try to digest her food before lying down at night and to put some blocks underneath the head of her bed.  Problem # 4:  HYPERTENSION (ICD-401.9)  The following medications were removed from the medication list:    Aspirin 81 Mg Tbec (Aspirin) .Marland Kitchen... Take one tablet by mouth daily Blood pressure control.  No change in therapy.  Problem # 5:  AORTIC VALVE REPLACEMENT, HX OF (ICD-V43.3) Followup echo will be done.  Problem # 6:  CAD (ICD-414.00)  The following medications were removed from the medication list:    Aspirin 81 Mg Tbec (Aspirin) .Marland Kitchen... Take one tablet by mouth daily Her updated medication list for this problem includes:    Coumadin 3 Mg Tabs (Warfarin sodium) .Marland Kitchen... Take as directed by anticoagulation clinic  Orders: EKG w/ Interpretation (93000) Coronary disease is stable.  EKG is done today and reviewed by me.  She has old diffuse T-wave changes.  There is no significant change since the prior tracing.  Patient Instructions: 1)  Stop Aspirin 2)  Your physician has requested that you have an echocardiogram.  Echocardiography is a painless test that uses sound waves to create images of your heart. It provides your doctor with information about the size and  shape of your heart and how well your heart's chambers and valves are working.  This procedure takes approximately one hour. There are no restrictions for this procedure. 3)  Follow up in 4 weeks

## 2010-08-12 NOTE — Medication Information (Signed)
Summary: rov/ez  Anticoagulant Therapy  Managed by: Eda Keys, PharmD Referring MD: Shawnie Pons MD PCP: Nila Nephew, MD Supervising MD: Gala Romney MD, Reuel Boom Indication 1: CVA-stroke (ICD-436) Lab Used: [facility] Site: [facility] INR POC 1.8 INR RANGE 2 - 3  Dietary changes: no    Health status changes: no    Bleeding/hemorrhagic complications: no    Recent/future hospitalizations: no    Any changes in medication regimen? no    Recent/future dental: no  Any missed doses?: no       Is patient compliant with meds? yes       Allergies: No Known Drug Allergies  Anticoagulation Management History:      The patient is taking warfarin and comes in today for a routine follow up visit.  Positive risk factors for bleeding include history of CVA/TIA.  Negative risk factors for bleeding include an age less than 85 years old.  The bleeding index is 'intermediate risk'.  Positive CHADS2 values include History of HTN and Prior Stroke/CVA/TIA.  Negative CHADS2 values include Age > 48 years old.  The start date was 11/07/2004.  Her last INR was 4.0 RATIO.  Anticoagulation responsible provider: Bensimhon MD, Reuel Boom.  INR POC: 1.8.  Cuvette Lot#: [id number].  Exp: 10/2010.    Anticoagulation Management Assessment/Plan:      The patient's current anticoagulation dose is Coumadin 3 mg tabs: Take as directed by Anticoagulation Clinic.  The target INR is 2 - 3.  The next INR is due 09/23/2009.  Anticoagulation instructions were given to patient.  Results were reviewed/authorized by Eda Keys, PharmD.  She was notified by Eda Keys.         Prior Anticoagulation Instructions: INR: 2.1 Continue with same dosage of 3mg  tablet daily except 4.5mg  on Mondays, Wednesdays and Fridays Recheck in 4 weeks  Current Anticoagulation Instructions: INR 1.8  Take 2 tablets today.  Then return to normal dosing schedule.  Take 1.5 tablets on Monday, Wednesday, and Friday and take 1 tablet  all other days.  Return to clinic in 3 weeks.

## 2010-08-14 NOTE — Medication Information (Signed)
Summary: rov  Anticoagulant Therapy  Managed by: Bethena Midget, RN, BSN Referring MD: Shawnie Pons MD PCP: Nila Nephew, MD Supervising MD: Antoine Poche MD, Fayrene Fearing Indication 1: CVA-stroke (ICD-436) Lab Used: [facility] Site: [facility] INR POC 3.4 INR RANGE 2 - 3  Dietary changes: yes       Details: Been eating less green leafy veggies  Health status changes: no    Bleeding/hemorrhagic complications: yes       Details: Bruises on arms, no other bleeding issues  Recent/future hospitalizations: no    Any changes in medication regimen? no    Recent/future dental: no  Any missed doses?: no       Is patient compliant with meds? yes      Comments: Pt doesn't want to return any sooner due to co-pay.   Allergies: No Known Drug Allergies  Anticoagulation Management History:      The patient is taking warfarin and comes in today for a routine follow up visit.  Positive risk factors for bleeding include history of CVA/TIA.  Negative risk factors for bleeding include an age less than 41 years old.  The bleeding index is 'intermediate risk'.  Positive CHADS2 values include History of HTN and Prior Stroke/CVA/TIA.  Negative CHADS2 values include Age > 28 years old.  The start date was 11/07/2004.  Her last INR was 4.0 RATIO.  Anticoagulation responsible provider: Antoine Poche MD, Fayrene Fearing.  INR POC: 3.4.  Cuvette Lot#: [id number].  Exp: 08/2011.    Anticoagulation Management Assessment/Plan:      The patient's current anticoagulation dose is Coumadin 3 mg tabs: Take as directed by Anticoagulation Clinic.  The target INR is 2.0-3.0.  The next INR is due 08/22/2010.  Anticoagulation instructions were given to patient.  Results were reviewed/authorized by Bethena Midget, RN, BSN.  She was notified by Bethena Midget, RN, BSN.         Prior Anticoagulation Instructions: INR 2.6  Take 1 1/2 tablet everyday except take 1 tablet on Mondays, Wednesdays, and Fridays.  Return to clinic for INR check in 4 weeks  on Friday, January 13th at 12noon.  Current Anticoagulation Instructions: INR 3.4 Skip today's dose. Then resume 1.5 pills everyday except 1 pill on Mondays, Wednesdays and Fridays. Recheck in 4 weeks.

## 2010-08-14 NOTE — Medication Information (Signed)
Summary: rov/eac   Anticoagulant Therapy  Managed by: Louann Sjogren, PharmD Referring MD: Shawnie Pons MD PCP: Nila Nephew, MD Supervising MD: Antoine Poche MD, Fayrene Fearing Indication 1: CVA-stroke (ICD-436) Lab Used: [facility] Site: [facility] INR POC 2.6 INR RANGE 2 - 3  Dietary changes: no    Health status changes: no    Bleeding/hemorrhagic complications: yes       Details: Upper extremity severe bleeding  Recent/future hospitalizations: no    Any changes in medication regimen? no    Recent/future dental: no  Any missed doses?: no       Is patient compliant with meds? yes       Allergies: No Known Drug Allergies  Anticoagulation Management History:      Positive risk factors for bleeding include history of CVA/TIA.  Negative risk factors for bleeding include an age less than 16 years old.  The bleeding index is 'intermediate risk'.  Positive CHADS2 values include History of HTN and Prior Stroke/CVA/TIA.  Negative CHADS2 values include Age > 26 years old.  The start date was 11/07/2004.  Her last INR was 4.0 RATIO.  Anticoagulation responsible provider: Antoine Poche MD, Fayrene Fearing.  INR POC: 2.6.  Exp: 06/2011.    Anticoagulation Management Assessment/Plan:      The patient's current anticoagulation dose is Coumadin 3 mg tabs: Take as directed by Anticoagulation Clinic.  The target INR is 2.0-3.0.  The next INR is due 07/25/2010.  Anticoagulation instructions were given to patient.  Results were reviewed/authorized by Louann Sjogren, PharmD.         Prior Anticoagulation Instructions: INR 3.0  Take 1/2 tablet today.  Then return to normal dosing schedule of 1 tablet on Monday, WEdnesday, and Friday and 1.5 tablets all other days.  Return to clinic in 4 weeks.     Current Anticoagulation Instructions: INR 2.6  Take 1 1/2 tablet everyday except take 1 tablet on Mondays, Wednesdays, and Fridays.  Return to clinic for INR check in 4 weeks on Friday, January 13th at 12noon.

## 2010-08-22 ENCOUNTER — Encounter: Payer: Self-pay | Admitting: Cardiology

## 2010-08-22 ENCOUNTER — Encounter (INDEPENDENT_AMBULATORY_CARE_PROVIDER_SITE_OTHER): Payer: 59

## 2010-08-22 DIAGNOSIS — I6789 Other cerebrovascular disease: Secondary | ICD-10-CM

## 2010-08-22 DIAGNOSIS — Z7901 Long term (current) use of anticoagulants: Secondary | ICD-10-CM

## 2010-08-28 NOTE — Medication Information (Signed)
Summary: Coumadin Clinic  Anticoagulant Therapy  Managed by: Cloyde Reams, RN, BSN Referring MD: Shawnie Pons MD PCP: Nila Nephew, MD Supervising MD: Daleen Squibb MD, Maisie Fus Indication 1: CVA-stroke (ICD-436) Lab Used: [facility] Site: [facility] INR POC 4.4 INR RANGE 2 - 3  Dietary changes: no    Health status changes: no    Bleeding/hemorrhagic complications: no    Recent/future hospitalizations: no    Any changes in medication regimen? no    Recent/future dental: no  Any missed doses?: no       Is patient compliant with meds? yes      Comments: Pt refuses to come back sooner than 4 weeks.    Allergies: No Known Drug Allergies  Anticoagulation Management History:      The patient is taking warfarin and comes in today for a routine follow up visit.  Positive risk factors for bleeding include history of CVA/TIA.  Negative risk factors for bleeding include an age less than 43 years old.  The bleeding index is 'intermediate risk'.  Positive CHADS2 values include History of HTN and Prior Stroke/CVA/TIA.  Negative CHADS2 values include Age > 82 years old.  The start date was 11/07/2004.  Her last INR was 4.0 RATIO.  Anticoagulation responsible provider: Daleen Squibb MD, Maisie Fus.  INR POC: 4.4.  Cuvette Lot#: [id number].  Exp: 07/2011.    Anticoagulation Management Assessment/Plan:      The patient's current anticoagulation dose is Coumadin 3 mg tabs: Take as directed by Anticoagulation Clinic.  The target INR is 2.0-3.0.  The next INR is due 09/12/2010.  Anticoagulation instructions were given to patient.  Results were reviewed/authorized by Cloyde Reams, RN, BSN.  She was notified by Cloyde Reams RN.         Prior Anticoagulation Instructions: INR 3.4 Skip today's dose. Then resume 1.5 pills everyday except 1 pill on Mondays, Wednesdays and Fridays. Recheck in 4 weeks.   Current Anticoagulation Instructions: INR 4.4  Skip today's dosage of Coumadin, then take 1/2 tablet tomorrow,  then start taking 1 tablet daily except 1.5 tablets on Sundays, Tuesdays, and Thursdays.  Recheck in 2 weeks.

## 2010-08-30 DIAGNOSIS — Z7901 Long term (current) use of anticoagulants: Secondary | ICD-10-CM

## 2010-08-30 DIAGNOSIS — Z954 Presence of other heart-valve replacement: Secondary | ICD-10-CM

## 2010-08-30 DIAGNOSIS — I6350 Cerebral infarction due to unspecified occlusion or stenosis of unspecified cerebral artery: Secondary | ICD-10-CM

## 2010-08-30 DIAGNOSIS — I359 Nonrheumatic aortic valve disorder, unspecified: Secondary | ICD-10-CM

## 2010-09-12 ENCOUNTER — Encounter: Payer: Self-pay | Admitting: Cardiovascular Disease

## 2010-09-12 ENCOUNTER — Encounter (INDEPENDENT_AMBULATORY_CARE_PROVIDER_SITE_OTHER): Payer: 59

## 2010-09-12 DIAGNOSIS — Z7901 Long term (current) use of anticoagulants: Secondary | ICD-10-CM

## 2010-09-12 DIAGNOSIS — I6789 Other cerebrovascular disease: Secondary | ICD-10-CM

## 2010-09-18 NOTE — Medication Information (Signed)
Summary: rov/ewj  Anticoagulant Therapy  Managed by: Windell Hummingbird, RN Referring MD: Shawnie Pons MD PCP: Nila Nephew, MD Supervising MD: Clifton James MD, Cristal Deer Indication 1: CVA-stroke (ICD-436) Lab Used: [facility] Site: [facility] INR POC 4.6 INR RANGE 2 - 3  Dietary changes: no    Health status changes: no    Bleeding/hemorrhagic complications: yes       Details: continues easily bruising  Recent/future hospitalizations: no    Any changes in medication regimen? no    Recent/future dental: no  Any missed doses?: no       Is patient compliant with meds? yes       Allergies: No Known Drug Allergies  Anticoagulation Management History:      The patient is taking warfarin and comes in today for a routine follow up visit.  Positive risk factors for bleeding include history of CVA/TIA.  Negative risk factors for bleeding include an age less than 48 years old.  The bleeding index is 'intermediate risk'.  Positive CHADS2 values include History of HTN and Prior Stroke/CVA/TIA.  Negative CHADS2 values include Age > 63 years old.  The start date was 11/07/2004.  Her last INR was 4.0 RATIO.  Anticoagulation responsible provider: Clifton James MD, Cristal Deer.  INR POC: 4.6.  Cuvette Lot#: [id number].  Exp: 07/2011.    Anticoagulation Management Assessment/Plan:      The patient's current anticoagulation dose is Coumadin 3 mg tabs: Take as directed by Anticoagulation Clinic.  The target INR is 2.0-3.0.  The next INR is due 09/26/2010.  Anticoagulation instructions were given to patient.  Results were reviewed/authorized by Windell Hummingbird, RN.  She was notified by Windell Hummingbird, RN.         Prior Anticoagulation Instructions: INR 4.4  Skip today's dosage of Coumadin, then take 1/2 tablet tomorrow, then start taking 1 tablet daily except 1.5 tablets on Sundays, Tuesdays, and Thursdays.  Recheck in 2 weeks.     Current Anticoagulation Instructions: INR 4.6 Skip today and tomorrow.  Then  begin taking 1 tablet every day, except take 1 1/2 tablets on Sundays. Recheck in 2 weeks.

## 2010-09-26 ENCOUNTER — Encounter: Payer: Self-pay | Admitting: Cardiology

## 2010-09-26 ENCOUNTER — Encounter (INDEPENDENT_AMBULATORY_CARE_PROVIDER_SITE_OTHER): Payer: 59

## 2010-09-26 DIAGNOSIS — I6789 Other cerebrovascular disease: Secondary | ICD-10-CM

## 2010-09-26 DIAGNOSIS — Z7901 Long term (current) use of anticoagulants: Secondary | ICD-10-CM

## 2010-09-30 NOTE — Medication Information (Signed)
Summary: rov/pc  Anticoagulant Therapy  Managed by: Bethena Midget, RN, BSN Referring MD: Shawnie Pons MD PCP: Nila Nephew, MD Supervising MD: Daleen Squibb MD, Maisie Fus Indication 1: CVA-stroke (ICD-436) Lab Used: [facility] Site: [facility] INR POC 3.0 INR RANGE 2 - 3  Dietary changes: yes       Details: eating  less veggies  Health status changes: no    Bleeding/hemorrhagic complications: yes       Details: some bruises on arms   Recent/future hospitalizations: no    Any changes in medication regimen? no    Recent/future dental: no  Any missed doses?: no       Is patient compliant with meds? yes      Comments: Suggested 2 week appt, then 3 week but pt states that financially she can't come back sooner than 4 weeks. She was made aware of risk.   Allergies: No Known Drug Allergies  Anticoagulation Management History:      The patient is taking warfarin and comes in today for a routine follow up visit.  Positive risk factors for bleeding include history of CVA/TIA.  Negative risk factors for bleeding include an age less than 49 years old.  The bleeding index is 'intermediate risk'.  Positive CHADS2 values include History of HTN and Prior Stroke/CVA/TIA.  Negative CHADS2 values include Age > 60 years old.  The start date was 11/07/2004.  Her last INR was 4.0 RATIO.  Anticoagulation responsible provider: Daleen Squibb MD, Maisie Fus.  INR POC: 3.0.  Cuvette Lot#: [id number].  Exp: 09/2011.    Anticoagulation Management Assessment/Plan:      The patient's current anticoagulation dose is Coumadin 3 mg tabs: Take as directed by Anticoagulation Clinic.  The target INR is 2.0-3.0.  The next INR is due 10/24/2010.  Anticoagulation instructions were given to patient.  Results were reviewed/authorized by Bethena Midget, RN, BSN.  She was notified by Bethena Midget, RN, BSN.         Prior Anticoagulation Instructions: INR 4.6 Skip today and tomorrow.  Then begin taking 1 tablet every day, except take 1 1/2  tablets on Sundays. Recheck in 2 weeks.   Current Anticoagulation Instructions: INR 3.0 Today take only take 1/2 pill then resume 1 pill everyday except 1.5 pill on Sundays. Recheck in 2 weeks.

## 2010-10-24 ENCOUNTER — Ambulatory Visit (INDEPENDENT_AMBULATORY_CARE_PROVIDER_SITE_OTHER): Payer: 59 | Admitting: *Deleted

## 2010-10-24 DIAGNOSIS — I6350 Cerebral infarction due to unspecified occlusion or stenosis of unspecified cerebral artery: Secondary | ICD-10-CM

## 2010-10-24 DIAGNOSIS — Z954 Presence of other heart-valve replacement: Secondary | ICD-10-CM

## 2010-10-24 DIAGNOSIS — I359 Nonrheumatic aortic valve disorder, unspecified: Secondary | ICD-10-CM

## 2010-10-24 DIAGNOSIS — Z7901 Long term (current) use of anticoagulants: Secondary | ICD-10-CM

## 2010-10-24 LAB — POCT INR: INR: 3.4

## 2010-11-08 ENCOUNTER — Encounter: Payer: Self-pay | Admitting: Cardiology

## 2010-11-10 ENCOUNTER — Encounter: Payer: Self-pay | Admitting: Cardiology

## 2010-11-10 DIAGNOSIS — I639 Cerebral infarction, unspecified: Secondary | ICD-10-CM

## 2010-11-10 DIAGNOSIS — Z7901 Long term (current) use of anticoagulants: Secondary | ICD-10-CM | POA: Insufficient documentation

## 2010-11-10 DIAGNOSIS — Z8673 Personal history of transient ischemic attack (TIA), and cerebral infarction without residual deficits: Secondary | ICD-10-CM | POA: Insufficient documentation

## 2010-11-10 DIAGNOSIS — E785 Hyperlipidemia, unspecified: Secondary | ICD-10-CM | POA: Insufficient documentation

## 2010-11-10 DIAGNOSIS — Z951 Presence of aortocoronary bypass graft: Secondary | ICD-10-CM | POA: Insufficient documentation

## 2010-11-10 DIAGNOSIS — I10 Essential (primary) hypertension: Secondary | ICD-10-CM | POA: Insufficient documentation

## 2010-11-11 ENCOUNTER — Encounter: Payer: Self-pay | Admitting: Cardiology

## 2010-11-11 ENCOUNTER — Ambulatory Visit (INDEPENDENT_AMBULATORY_CARE_PROVIDER_SITE_OTHER): Payer: 59 | Admitting: *Deleted

## 2010-11-11 ENCOUNTER — Ambulatory Visit (INDEPENDENT_AMBULATORY_CARE_PROVIDER_SITE_OTHER): Payer: 59 | Admitting: Cardiology

## 2010-11-11 DIAGNOSIS — Z954 Presence of other heart-valve replacement: Secondary | ICD-10-CM

## 2010-11-11 DIAGNOSIS — Z7901 Long term (current) use of anticoagulants: Secondary | ICD-10-CM

## 2010-11-11 DIAGNOSIS — I2510 Atherosclerotic heart disease of native coronary artery without angina pectoris: Secondary | ICD-10-CM

## 2010-11-11 DIAGNOSIS — I359 Nonrheumatic aortic valve disorder, unspecified: Secondary | ICD-10-CM

## 2010-11-11 DIAGNOSIS — I6350 Cerebral infarction due to unspecified occlusion or stenosis of unspecified cerebral artery: Secondary | ICD-10-CM

## 2010-11-11 LAB — POCT INR: INR: 3.2

## 2010-11-11 NOTE — Patient Instructions (Signed)
Your physician wants you to follow-up in: 1 year. You will receive a reminder letter in the mail two months in advance. If you don't receive a letter, please call our office to schedule the follow-up appointment.  

## 2010-11-11 NOTE — Assessment & Plan Note (Signed)
The patient's aortic valve sounds quite good.  She does not need an echo at this time.  Follow up in one year.

## 2010-11-11 NOTE — Assessment & Plan Note (Signed)
Coronary disease is stable. No change in therapy. 

## 2010-11-11 NOTE — Progress Notes (Signed)
HPI The patient is seen for followup coronary disease and aortic valve replacement.  She is actually doing very well.  She had some shortness of breath and we felt she was stable.  She is not bothered by them at this time.  She's not had any chest pain.  She had a mechanical valve placed in the aortic position with a Bental procedure. Not on File  Current Outpatient Prescriptions  Medication Sig Dispense Refill  . Calcium Carbonate-Vitamin D (CALCIUM 600/VITAMIN D) 600-400 MG-UNIT per chew tablet Chew 1 tablet by mouth 2 (two) times daily.        Marland Kitchen omeprazole (PRILOSEC) 20 MG capsule Take 20 mg by mouth daily.        . simvastatin (ZOCOR) 40 MG tablet Take 40 mg by mouth at bedtime.        . triamcinolone (KENALOG) 0.5 % cream Apply topically at bedtime.        . triazolam (HALCION) 0.25 MG tablet Take 0.25 mg by mouth at bedtime as needed.        . warfarin (COUMADIN) 3 MG tablet Take by mouth as directed.          History   Social History  . Marital Status: Married    Spouse Name: N/A    Number of Children: N/A  . Years of Education: N/A   Occupational History  . Not on file.   Social History Main Topics  . Smoking status: Former Games developer  . Smokeless tobacco: Not on file  . Alcohol Use: No  . Drug Use: Not on file  . Sexually Active: Not on file   Other Topics Concern  . Not on file   Social History Narrative   She lives in [place] with her husband and son. She previously smoked 37-pack-years,but quit following her stroke in April of 2006. She denies any alcohol or drugs. She has not been routinely exercising.    Family History  Problem Relation Age of Onset  . Stroke Mother   . Alcohol abuse Father   . Hypertension Father   . Stroke Father   . Hyperlipidemia Father   . Stroke Brother     she has three and at least one of them has had a stroke    Past Medical History  Diagnosis Date  . Warfarin anticoagulation     coumadin therapy  . Hypertension   . Hx of  CABG     2006,LIMA to LAD, SVG to diagonal  . 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 Dyslipidemia   . CAD (coronary artery disease)   . Heart valve replaced by other means 03/2005    bentall procedure.#21 st.jude mechanical vavle conduit with re- implantation of coronaries. good valve function ..echo 10/09,mechanical valve working well.. echo 10/11.  . S/P aortic valve replacement     Bental procedure,#21 St. Jude mechanical valve conduit with reimplantation of the coronaries 2006 / valve working well, echo, October, 2009 / valve working well echo, October, 2011  . Indigestion 10/11  . SOB (shortness of breath)   . Aortic stenosis     Aortic valve replacement 2006  . Ejection fraction     60%, echo, 2009 / 55-60%, echo, October, 2011  . Ecchymosis     From Coumadin and aspirin    No past surgical history on file.  ROS  Patient denies fever, chills, headache, sweats, rash, change in vision, change in hearing, chest pain,  cough, nausea vomiting, urinary symptoms.  All other systems are reviewed and are negative.  PHYSICAL EXAM Patient is quite stable today.  She is oriented to person time and place.  Affect is normal.  Head is atraumatic.  There is no xanthelasma.  There no carotid bruits.  Lungs are clear.  Respiratory effort is nonlabored.  Cardiac exam reveals an S1 with a crisp closure sound of her aortic prosthesis.  There is a soft outflow murmur.  There is no aortic insufficiency.  The abdomen is soft.  There are no musculoskeletal deformities.  There is no peripheral edema. Filed Vitals:   11/11/10 1108  BP: 112/64  Pulse: 74  Height: 5\' 2"  (1.575 m)  Weight: 152 lb (68.947 kg)    EKG EKG is done today and reviewed by me.There is no significant change since the prior tracing.  She has mild diffuse ST-T wave changes.  ASSESSMENT & PLAN

## 2010-11-11 NOTE — Assessment & Plan Note (Signed)
Patient continues on her anticoagulation.  She is stable with this.

## 2010-11-25 NOTE — Assessment & Plan Note (Signed)
[facility]                            CARDIOLOGY OFFICE NOTE   NAME:Elizabeth Morton, Elizabeth Morton                      MRN:          [id number]  DATE:04/11/2008                            DOB:          [birth year]    HISTORY:  Anaiya Wisinski has a history of aortic stenosis and an aneurysm  of the ascending aorta.  She underwent replacement of her aortic valve  with a new aortic root brief procedure (Bentall procedure) by Dr. Laneta Simmers  in 2006.  She also underwent CABG x2 with a LIMA to the LAD and vein  graft to the diagonal.  She is doing well.  She has been seeing Dr.  Andee Lineman in the past.  I will be taking over her cardiology care.  I have  reviewed all of the pertinent records in her chart.  She is stable.  She  has not had any significant chest pain.  She is not having any  significant shortness of breath.  There has been no syncope or  presyncope.  She is fully active.  She has some other issues that her  stressing her right now but not her health.  Her last echo was done in  March 2007.  Ejection fraction then was 60-65%.  Her St. Jude aortic  prosthesis was working well.  There was no significant perivalvular  leak.   The patient is on Coumadin.  She also has a history of hypertension.   PAST MEDICAL HISTORY:   ALLERGIES:  No known drug allergies.   MEDICATIONS:  Coumadin, triazolam at night, aspirin 81, and simvastatin  40.   OTHER PAST MEDICAL PROBLEMS:  See the complete list below.   REVIEW OF SYSTEMS:  Other than the HPI, she is not having any  significant GI or GU symptoms.  She has no fever.  She is not having any  headaches or skin rashes.  Her review of systems is negative.   PHYSICAL EXAMINATION:  VITAL SIGNS:  Blood pressure is 110/60.  Pulse is  75.  GENERAL:  The patient is oriented to person, time, and place.  Affect is  normal.  HEENT:  No xanthelasma.  She has normal extraocular motion.  NECK:  There are no carotid bruits.  There is  slight radiation of her  outflow murmur to the neck.  There is no jugular venous distention.  LUNGS:  Clear.  Respiratory effort is not labored.  CARDIAC:  An S1 with a very crisp S2.  No AI is heard.  ABDOMEN:  Soft.  She has no masses or bruits.  There is no peripheral  edema.   EKG reveals sinus rhythm.  She has old T-wave inversions that are  diffuse including V1, V2, and V3.  This has not changed.   PROBLEMS:  1. History of aortic stenosis and ascending aortic root aneurysm.  The      patient is status post placement of a #21 St. Jude mechanical valve      conduit with reimplantation of the right and left coronary arteries      under deep hypothermic circulatory arrest (  Bentall procedure).  2. Status post CABG at that time for her Bentall procedure with a LIMA      to the LAD and a vein graft to the diagonal.  The patient is on      Coumadin.  She is also on aspirin and on simvastatin.  Dr. Chilton Si      will be following her cholesterol.  She is planning to see him      soon.  3. History of a CVA related to thrombosed aortic root aneurysm.      Fortunately, this problem has been treated and she is stable.  4. Dyslipidemia.  She is on medications.  5. Hypertension.  Her blood pressure is nicely controlled.  6. Chronic Coumadin therapy and this is being nicely followed.   It has been 2 years since her last echo.  We will arrange for followup  echo to be sure that LV function is remaining normal along with the  function of her aortic prosthesis and conduit.  She does not need an  echo on a yearly basis.  After we do this current study, we can probably  wait at least 2 years if not 3 years until her next echo.   I explained the rationale for an echo at this time with her.     Luis Abed, MD, [facility]  Electronically Signed    JDK/MedQ  DD: 04/11/2008  DT: 04/12/2008  Job #: [id number]   cc:   Erskine Speed, M.D.

## 2010-11-28 NOTE — Discharge Summary (Signed)
Elizabeth Morton, Elizabeth Morton               ACCOUNT NO.:  [id number]   MEDICAL RECORD NO.:  [id number]          PATIENT TYPE:  INP   LOCATION:  2022                         FACILITY:  [facility]   PHYSICIAN:  Evelene Croon, M.D.     DATE OF BIRTH:  03-29-1956   DATE OF ADMISSION:  03/23/2005  DATE OF DISCHARGE:                                 DISCHARGE SUMMARY   ANTICIPATED DATE OF DISCHARGE:  April 01, 2005.   PRIMARY ADMITTING DIAGNOSES:  1.  Severe aortic stenosis with  2.  Single-vessel coronary artery disease.   DISCHARGE DIAGNOSES:  1.  Severe aortic stenosis with bicuspid aortic valve.  2.  Single-vessel coronary artery disease.  3.  Ascending aortic aneurysm.  4.  History of tobacco use  5.  History of prior cerebrovascular accident.  6.  Hyperlipidemia.  7.  Hypertension.  8.  Status post spinal surgery at age 16.  31.  Chronic Coumadin for previous cerebrovascular accident.   OPERATION/PROCEDURE:  1.  Multiple dental extractions.  2.  Coronary artery bypass grafting times two; left internal mammary artery      to the left anterior descending and saphenous vein graft to the      diagonal.  3.  Replacement of aortic valve and __________  ascending aortic aneurysm      using a 21 millimeter St. Jude mechanical valve conduit with      reimplantation of the right and left coronary arteries in deep      hypothermic circulatory arrest (Bentall procedure).  4.  Endoscopic vein harvest, left leg.   HISTORY:  The patient is a 55 year old female who suffered a right middle  cerebral artery stroke in April 2006.  She underwent an echocardiogram at  that time, which showed a severe aortic stenosis with a bicuspid aortic  valve and an aortic valve area of 0.6-0.7 cm squared.  Transesophageal  echocardiogram also noted a thrombus in her in her aortic arch, which was  felt to be the etiology of her embolic event.  She has been treated with  Coumadin since that time had no further  neurologic problems.  During that  admission she was seen in consultation by Dr. Sheliah Plane for  consideration of aortic valve replacement.   Upon further questioning she has had exertional dyspnea with chest pain and  fatigue.  She had an MR angiogram, which showed the maximum dimension of the  ascending aorta to be about 3.8 cm with a 1.5 cm descending aorta.  Dr.  Tyrone Sage felt based on her symptoms, and her echo and MR angiogram findings  that she would benefit from coronary bypass surgery as well as aortic valve  replacement and replacement of her ascending aorta.  Prior to surgery it was  felt that she would need dental extractions and she was seem in consultation  by Dr. Kristin Bruins.  Her arranged outpatient extractions on March 23, 2005;  and, following this she is admitted for cardiac catheterization nd surgery.   HOSPITAL COURSE:  The patient was admitted on March 23, 2005 to [facility].  She  underwent extraction of remaining teeth by Dr. Kristin Bruins.  Following this procedure she was transferred to the floor and started on  heparin.  She underwent cardiac catheterization on March 24, 2005, which  showed 60-70% stenosis of the mid LAD and 50-60% proximal stenosis of the  diagonal.  It was felt that she would benefit from cardiac surgery at this  time.  She previously had been seen by Dr. Tyrone Sage; however, he was at work  in [facility] at the time so the patient was seen by Dr. Evelene Croon. He  reviewed her films and agreed with Dr. Dennie Maizes previous assessment.  He  discussed surgery, its risks and benefits with the patient, and she did  agree to proceed.   The patient was taken to the operating room on March 25, 2005 and  underwent CABG times two as well as the Bentall procedure as described in  detail above.  She tolerated the procedure well and was transferred to the  SICU in stable condition.   Postoperatively she was able to be extubated  shortly after surgery, she was  hemodynamically stable and doing well on postop day one.  She did developed  atrial fibrillation and was started on an amiodarone drip.  She was able to  quickly convert back to normal sinus rhythm.  Her chest tubes and  hemodynamic devices were removed,  and she remained in the unit for further  observation.   By postop day two she was started on Coumadin for anticoagulation for her  valve as well as her previous history of CVA.  She was weaned from the  amiodarone drip and was started on a p.o. dose.  She was started on a  diuretic and was slowly mobilized. She did develop some hypotension and was  started on  a Neo-Synephrine drip.   By postop day four she was off all drips and was able to be started on a  beta blocker, and was transferred to the floor.  She was restarted on her  home dose of Zocor; however, because of its potential interaction with  amiodarone  her dose was decreased be half.  She continued to progress well,  was ambulating with cardiac rehab Phase 1 as well as independently several  times per day.  She tolerated a regular diet, and has normal bowel and  bladder functions.  Her surgical incision sites are all healing well.  Her  INR is super therapeutic today at 3.6 and her Coumadin has been held.  The  remainder of her labs showed a hemoglobin of 11.8, hematocrit 34, platelets  284,000 and white count 6.4.  Sodium 138, potassium 3.9, BUN 7 and  creatinine 0.8.  She has been diuresed back down to her preoperative weight  and her diuretic has been discontinued.  She has been weaned from  supplemental oxygen and is maintaining O2 sats of greater than 90% on room  air.  She has remained afebrile and all vital signs have been stable.  It is  felt if she continues to remain stable over the next 24 hours her INR trends  downward and her pacing wires are able to be removed hopefully she will be  ready for discharge on April 01, 2005.  DISCHARGE MEDICATIONS:  The discharged medications are as follows:  1.  Coumadin, home dose will be determined by PT/INR drawn on the day of      discharge.  2.  Aspirin 81 mg daily.  3.  Toprol  XL 25 mg daily.  4.  Zocor 20 mg at bedtime.  5.  Amiodarone 200 mg twice a day.  6.  Halcion 0.25 mg at bedtime p.r.n.  7.  Tylox 1-2 q.4 hours p.r.n. pain.   DISCHARGE INSTRUCTIONS:  1.  The patient asked to refrain from driving heaving lifting or strenuous      activity.  2.  The patient may continue ambulating daily and using her incentive      spirometer.  3.  The patient may shower daily and clean her incision with soap and water.  4.  The patient will continue a low-sodium.   DISCHARGE FOLLOW-UP:  The patient will see Dr. Andee Lineman in two weeks.  She  will have a chest x-ray at that visit.  She will then follow up with Dr.  Laneta Simmers on April 21, 2005 at 11:15 A.M.  She is asked to have a PT and INR  drawn at the [facility] on April 02, 2005.  She will be  contacted with any changes to her anticoagulation.  She is asked to contact  our office if she experiences any problems or has questions upon discharge.   NOTE:  Also of note the patient did have a 2-D echocardiogram performed on  March 31, 2005; the results of which are pending at this time, but will  be reviewed prior to her discharge.      Coral Ceo, P.A.      Evelene Croon, M.D.  Electronically Signed    GC/MEDQ  D:  03/31/2005  T:  04/01/2005  Job:  [id number]   cc:   Learta Codding, M.D. [facility]  [address]  [address]  [place]  [place] [address]   Dr. Chilton Si

## 2010-11-28 NOTE — Discharge Summary (Signed)
Elizabeth Morton, Elizabeth Morton               ACCOUNT NO.:  [id number]   MEDICAL RECORD NO.:  [id number]          PATIENT TYPE:  INP   LOCATION:  2022                         FACILITY:  [facility]   PHYSICIAN:  Evelene Croon, M.D.     DATE OF BIRTH:  1956-02-06   DATE OF ADMISSION:  03/23/2005  DATE OF DISCHARGE:  04/10/2005                                 DISCHARGE SUMMARY   ADDENDUM:   ADDITIONAL DISCHARGE DIAGNOSES:  1.  Left pleural effusion  2.  Hypotension,  3.  Urinary tract infection.   ADDITIONAL PROCEDURES:  Left thoracentesis.   Ms. Harnish was initially scheduled for discharge home on April 01, 2005. However, on that date her INR was still supratherapeutic at 4.4 with a  PT of 41.6. Because of this she required further observation in the  hospital. She also developed hypotension with blood pressures at one point  running in the high 60s systolic. In general her blood pressures were 80s-  90s systolic. Her preoperative blood pressures ran in this range as well. It  is unclear the etiology of her hypotension but it was felt prudent to  discontinue her beta blocker as well as her amiodarone in an attempt to  normalize her blood pressure a little more. She also developed some  shortness of breath and upon chest x-ray was noted to have a little moderate  left pleural effusion. Dr. Laneta Simmers performed a left thoracentesis at the  bedside on April 03, 2005 and drained approximately 500 mL of old bloody  fluid. A followup chest x-ray confirmed improvement and no pneumothorax. Her  Coumadin continued to be withheld secondary to an elevated INR. Finally on  April 04, 2005 her INR was 1.18 at that time her pacing wires were able  to be removed. She was subsequently restarted on Coumadin with an eye toward  discharge once she was therapeutic. She also complained of low back pain and  dysuria. She was found on urine cultures have a urinary tract infection from  gram-positive rods. She  was started on Cipro and at the time of discharge  had completed a 5-day course. Symptomatically she is improved on  antibiotics. Her anticoagulation has been ongoing and on April 10, 2005  her INR was 1.8. She had otherwise remained stable. Her blood pressures  continued to run in the 80s to 90s systolic, but she appeared to be  tolerating this without difficulty. She had no other acute changes; and it  was felt that she was stable for discharge home with a close followup of INR  on Monday October 1.   DISCHARGE MEDICATIONS:  1.  Enteric-coated aspirin 81 mg daily.  2.  Zocor 20 mg daily.  3.  Coumadin 3 mg daily until the PT and INR are checked on October 2.  4.  Cipro 500 mg b.i.d. x5 more days.  5.  Tylox 1-2 q.4 h p.r.n. for pain.   DISCHARGE INSTRUCTIONS:  Unchanged from the previously dictated discharge  summary. Home health has been arranged and will draw a PT and INR on October  2 and call  the results of the [facility] for further dosing of  her Coumadin. Otherwise her discharge instructions are unchanged from the  previous dictated discharge summary.      Coral Ceo, P.A.      Evelene Croon, M.D.  Electronically Signed    GC/MEDQ  D:  04/10/2005  T:  04/10/2005  Job:  [id number]   cc:   Learta Codding, M.D. [facility]  [address]  [address]  [place]  [place] [address]   Dr. Chilton Si

## 2010-11-28 NOTE — H&P (Signed)
Elizabeth Morton, Elizabeth Morton               ACCOUNT NO.:  [id number]   MEDICAL RECORD NO.:  [id number]          PATIENT TYPE:  INP   LOCATION:  1825                         FACILITY:  [facility]   PHYSICIAN:  Michael L. Reynolds, M.D.DATE OF BIRTH:  Feb 14, 1956   DATE OF ADMISSION:  11/05/2004  DATE OF DISCHARGE:                                HISTORY & PHYSICAL   CHIEF COMPLAINT:  Slurred speech and left-sided weakness.   HISTORY OF PRESENT ILLNESS:  This is the initial [facility] system  admission for this 55 year old woman with no known chronic medical problems.  The patient was talking on the phone with a friend yesterday at about 1:25  p.m. when she said she experienced the sudden onset of slurred speech.  She  also noticed that her left hand was clumsy and she was unable to hold  anything.  The patient stayed home to monitor her symptoms and did not seek  medical attention at that time.  Later in the day she noticed onset of a  severe headache.  She said her symptoms have been stable since onset.  She  came to the emergency room today for evaluation where she is noted to have  left-sided deficits and a CT of the head demonstrated an acute stroke.  She  is subsequently admitted to the stroke service for further evaluation and  workup.  She denies ever having any similar symptoms of this before or  denies ever having any transient neurologic phenomena.   PAST MEDICAL HISTORY:  She had back surgery in the eighth grade.  Beyond  that she denies any chronic medical problems previous hospitalizations or  surgeries although she admits she has not been to the doctor in years.   FAMILY HISTORY:  Remarkable for stroke in a parent and older brother as well  as coronary artery disease.   SOCIAL HISTORY:  She smokes one pack a day.  She says she has not used  alcohol for the last four years.  She is unemployed but independent in her  activities of daily living .   ALLERGIES:  No known drug  allergies.   CURRENT MEDICATIONS:  None.   REVIEW OF SYSTEMS:  She reports decreased appetite over the last few weeks.  She has had mild nausea yesterday and today since the event.  She reports  decreased sensation in both feet going on for the last few months but denies  polyuria or polydipsia.  Otherwise 14-system review of systems is negative  except as outlined in the HPI in the admission nursing record.   PHYSICAL EXAMINATION:  VITAL SIGNS:  Temperature 97.0, blood pressure  119/79, pulse 98, respirations 18.  GENERAL:  This is a healthy-appearing female supine in the hospital bed in  no evident distress.  HEENT:  Normocephalic, atraumatic.  Oropharynx benign.  NECK:  Supple without carotid bruits.  CHEST:  Clear to auscultation bilaterally.  HEART:  Regular rate and rhythm without murmur.  ABDOMEN:  Soft, normoactive bowel sounds, no hepatosplenomegaly.  EXTREMITIES:  Trace edema, 1+ dorsalis pedis pulses.  NEUROLOGIC:  Mental status:  She  is awake and alert.  Her speech is fluent  and mildly dysarthric and is normal in content.  She is fully oriented and  able to follow complex commands.  Cranial nerves and funduscopic exam is  benign.  Pupils are small, equal and reactive.  Extraocular movements are  full without nystagmus.  Examination of the visual fields demonstrate a  consistent left hemi neglect.  She has moderate right facial weakness.  Tongue and palate move normally and symmetrically.  Motor: normal bulk and  tone.  There is normal strength in all tested extremity muscles when she  attempts at left but she does have some decreased fine motor function on the  left.  Sensory:  She reports diminished pinprick sensation on the left,  especially in the lower extremity.  She also has demonstrated consistent  sensory neglect to double simultaneous stimulation.  Reflexes are increased  on the left. Toes are upgoing bilaterally.  Cerebellar: finger-to-nose is  performed  adequately.   LABORATORY DATA:  CBC white count 5.4, hemoglobin 14.6, platelets 180,000.  Chemistries are unremarkable.  Fingerstick blood glucose is 104.  Prothrombin time is low at 11.1, PTT 26.  CT of the head is personally  reviewed and demonstrates an acute stroke in the right frontal parietal area  with mostly subcortical white matter with some mild edema.  Also noted is  increased signal in the right middle cerebral artery, so-called MCA sign.   IMPRESSION:  Right brain stroke with left hemisensory symptoms, hemineglect,  facial droop and dysarthria due to an incomplete right middle cerebral  artery occlusion.  She has no known risk factors for cerebrovascular  disease.   PLAN:  We will admit for routine stroke workup including MRI, MRA, carotid  transcranial Doppler, 2-D echocardiogram and stroke labs.  She will be  evaluated by physical, occupational and speech therapy and ultimately by the  rehab service and disposition pending the outcome of her tests.  Stroke  service to follow.      MLR/MEDQ  D:  11/05/2004  T:  11/05/2004  Job:  [id number]

## 2010-11-28 NOTE — Consult Note (Signed)
Elizabeth Morton, Elizabeth Morton               ACCOUNT NO.:  [id number]   MEDICAL RECORD NO.:  [id number]          PATIENT TYPE:  INP   LOCATION:  3019                         FACILITY:  [facility]   PHYSICIAN:  Arturo Morton. Riley Kill, M.D. [facility] BIRTH:  10-30-1955   DATE OF CONSULTATION:  11/10/2004  DATE OF DISCHARGE:                                   CONSULTATION   REQUESTING PHYSICIAN:  Marolyn Hammock. Thad Ranger, M.D.   PATIENT PROFILE/CHIEF COMPLAINT:  Aortic clot status post cerebrovascular  accident.   PAST MEDICAL HISTORY/PROBLEM LIST:  1.  Status post right middle cerebral artery cerebrovascular accident.  2.  A 37 pack year history of tobacco abuse.  3.  Severe aortic stenosis.      1.  On October 06, 2004, echocardiogram ejection fraction 65-75%, normal          wall motion, severe aortic stenosis with valve area 0.62 cm2,          trivial MR.  4.  Aortic thrombus noted on TEE November 07, 2004.  5.  Hyperlipidemia, LDL 139.   HISTORY OF PRESENT ILLNESS:  A 55 year old white female who had no primary  medical history with the exception of tobacco abuse who was in her usual  state of health until approximately 1:25 p.m. on November 04, 2004 when she  felt the sudden onset of slurred speech and left hand clumsiness.  She did  not seek medical attention and later developed a headache without worsening  of other symptoms.  She presented to [facility] Emergency Department on  November 05, 2004 and a head CT scan was performed and showed findings  consistent with right middle cerebral artery thrombus with MRI confirming  occlusion of the right middle cerebral artery trunk.  She was initiated on  Aggrenox.  She underwent transthoracic echocardiogram on November 06, 2004  showing normal left ventricular function with severe aortic stenosis.  Follow-up TE on November 07, 2004 showed a bicuspid aortic valve with severe  aortic stenosis as well as an aortic arch thrombus.  It was recommended  after that test that she  be worked up for a hypercoagulable state, obtain  chest CT scan to rule out dissection although this was felt to be doubtful,  and finally consider long-term anticoagulation.  The patient has been  evaluated with protein C, protein S, anticardiolipin antibody, factor V  Leiden, lupus anticoagulant, and prothrombin gene mutation, all pending.  Her antithrombin III was normal at 85 and her homocystine was normal at  12.09.  She also underwent a CT scan of the head and neck on November 07, 2004  showing a less than 50% stenosis in the right internal carotid artery with  partial lysis of presumed thrombus in the distal M1 segment of the right  MCA.   ALLERGIES:  NO KNOWN DRUG ALLERGIES.   HOME MEDICATIONS:  None.   HOSPITAL MEDICATIONS:  1.  Aggrenox one capsule daily.  2.  Lovenox 40 mg every 24 hours.  3.  Nicoderm CQ 7 mg daily.  4.  Zocor 10 mg q.h.s.   FAMILY HISTORY:  Her mother is alive at age 73 and is status post  cerebrovascular accident.  Her father died at age 8. He was an alcoholic  and probably had hypertension.  She thinks he may have had a stroke.  She  has three brothers, one who died of suicide, one other who had a severe  stroke.   SOCIAL HISTORY:  She lives in [place] with her husband and son.  She has  smoked a pack a day for approximately 37 years and is very eager to quit.  She denies alcohol or drugs.  She does not routinely exercise.   REVIEW OF SYSTEMS:  Positive for a headache presentation, positive for  weakness and numbness on the left side which is improving, positive for  anxiety.  All other systems reviewed negative.   PHYSICAL EXAMINATION:  VITAL SIGNS:  Temperature 97.6, heart rate 62,  respirations 18, blood pressure 112/61, weight is 115 pounds, pulse oximetry  of 96% on room air.  GENERAL:  A pleasant white female in no acute distress.  Alert and oriented  x3.  NECK:  Normal carotid upstrokes.  No bruits or jugulovenous distention.  LUNGS:   Respirations are regular and nonlabored with scattered rhonchi.  CARDIAC:  Regular S1 and S2.  No S3 or S4 with a 3/6 systolic ejection  murmur heard, the loudest at the bilateral upper sternal borders.  ABDOMEN:  Round, soft, nontender and nondistended.  Bowel sounds present x4.  EXTREMITIES:  Warm and dry, pink.  No cyanosis, clubbing or edema.  Dorsalis  pedis, posterior tibial pulses 2+ and equal bilaterally.  NEUROLOGICAL:  She has 5/5 strength on the right and 4/5 strength on the  left.  She does have left-sided facial droop.   ACCESSORY CLINICAL FINDINGS:  EKG shows sinus rhythm without acute ST T  changes.  She does not have a chest x-ray ordered.  Hemoglobin 14.6,  hematocrit 41.6, WBC 5.4, platelets 180,000.  Sodium 139, potassium 3.2,  chloride 108, CO2 28, BUN 2, creatinine 0.6, glucose 83, total bilirubin  0.7, alkaline phosphatase 91, AST 42, ALT 22, hemoglobin A1c 4.9, total  cholesterol 168, triglycerides 101, HDL 39, LDL 139.  PTT 26, PT 11.1, INR  0.8.   ASSESSMENT AND PLAN:  1.  Aortic thrombus.  She is in the process of undergoing hypercoagulable      state work up with multiple laboratories pending.  A chest CT scan was      ordered today to rule out dissection.  Would also consider a hematology      consult to determine the necessity of long-term anticoagulation once      hypercoagulable state work up is complete.  2.  Severe aortic stenosis. Valve area of 0.6 cm2.  She is currently      asymptomatic and has never had chest pain, syncope or shortness of      breath.  She needs to be followed closely as an outpatient with      echocardiograms probably every six months.  3.  Status post cerebrovascular accident.  She is being treated per neuro,      is currently on Aggrenox.  4.  Tobacco abuse.  She really does want to quit and has been advised      cessation.  She is currently on a low dose nicotine patch. 5.  Hyperlipidemia.  LDL is 139.  Will then increase the  Zocor to 40 mg      q.h.s. with an LDL goal  of less than 100.   I will follow this patient.     CRB/MEDQ  D:  11/10/2004  T:  11/10/2004  Job:  [id number]

## 2010-11-28 NOTE — H&P (Signed)
NAMEELDONNA, Elizabeth Morton               ACCOUNT NO.:  [id number]   MEDICAL RECORD NO.:  [id number]          PATIENT TYPE:  AMB   LOCATION:  SDS                          FACILITY:  [facility]   PHYSICIAN:  Thomas C. Wall, M.D.   DATE OF BIRTH:  12-23-1955   DATE OF ADMISSION:  03/23/2005  DATE OF DISCHARGE:                                HISTORY & PHYSICAL   PRIMARY CARDIOLOGIST:  Dr. Lewayne Bunting   PATIENT PROFILE:  A 55 year old white female who is status post CVA in April  2006 who presents today following tooth extraction with plans for cardiac  catheterization on September 12 and AVR with Dr. Tyrone Sage on September 13.   PROBLEMS:  1.  Severe aortic stenosis.      1.  Echocardiogram October 06, 2004 showing an ejection fraction of 65-          75%, normal wall motion, severe aortic stenosis with a valve area of          0.62 sq cm, trivial mitral regurgitation.      2.  October 07, 2004 TEE showing severe aortic stenosis with bicuspid          aortic valve and mild aortic insufficiency.  Prominence versus          plaque noted in the aortic arch.  2.  Status post right middle cerebral artery cerebrovascular accident April      2006.  3.  Hyperlipidemia.  4.  Remote tobacco abuse.  5.  Periodontal disease status post tooth extraction with Dr. Kristin Bruins.  6.  Hypertension.   HISTORY OF PRESENT ILLNESS:  A 55 year old white female who was previously  healthy with the exception of tobacco abuse and was in her usual state of  health until November 04, 2004 when she had sudden onset of slurred speech and  left hand clumsiness.  She did not seek medical attention until November 05, 2004 when a head CT was performed showing findings consistent with right  middle cerebral artery thrombus with MRI confirming occlusion of the right  middle cerebral artery trunk.  She was initiated on Aggrenox and underwent  echocardiogram showing severe aortic stenosis.  Follow-up transesophageal  echocardiogram showed  bicuspid aortic valve with severe aortic stenosis as  well as an aortic arch thrombus.  She was discharged home in May with a  Lovenox bridge and Coumadin had been initiated and maintained since.  She  has followed with Dr. Pearlean Brownie as well as Dr. Tyrone Sage from [facility] for  consideration of aortic valve replacement.  In late August she underwent an  MRI which did not show any aortic thrombus.  She was seen in cardiology  clinic March 13, 2005 and arrangements were made for her to come off of  her Coumadin while being bridged with Lovenox and then to have her tooth  extraction which took place earlier today under the care of Dr. Kristin Bruins  with plans for catheterization on September 12 and AVR September 13.  Ms.  Morton currently denies any chest pain and although communication is  limited by the packing  in her mouth, she is able to communicate that her  shortness of breath has been stable and such she has not had any chest pain.   ALLERGIES:  No known drug allergies.   HOME MEDICATIONS:  1.  Diovan/HCT 80/12.5 mg daily.  2.  Zocor 40 mg q.h.s.  3.  Coumadin has been on hold.  4.  Triazolam 0.25 mg q.h.s.  5.  Lovenox 50 mg subcutaneous q.12h. which is currently on hold.   FAMILY HISTORY:  Her mother is alive at age 40 with a history of CVA.  Her  father died at age 48 with alcoholism, hypertension, cholesterol, CVA.  She  has three brothers and at least one of them has had a stroke.   SOCIAL HISTORY:  She lives in [place] with her husband and son.  She  previously smoked 37-pack-years, but quit following her stroke in April of  2006.  She denies any alcohol or drugs.  She has not been routinely  exercising.   REVIEW OF SYSTEMS:  Positive for occasional dyspnea on exertion as well as  anxiety.  Otherwise, all systems reviewed and negative.   PHYSICAL EXAMINATION:  VITAL SIGNS:  Blood pressure 106/82, heart rate 80,  respirations 16.  She is afebrile.  GENERAL:  Pleasant white  female in no acute distress.  Awake, alert, and  oriented x3.  NECK:  Normal carotid upstrokes.  No bruits or JVD.  LUNGS:  Respirations regular, unlabored.  Clear to auscultation.  CARDIAC:  Regular S1, S2 with a 2/6-3/6 systolic ejection murmur at the  retrosternal border.  ABDOMEN:  Round, soft, nontender, nondistended.  Bowel sounds present x4.  EXTREMITIES:  Warm, dry, pink.  No clubbing, cyanosis, or edema.  Dorsalis  pedis, posterior tibial pulses are 1+ and equal bilaterally.   ASSESSMENT/PLAN:  1.  Severe aortic stenosis.  Patient underwent successful tooth extraction      this morning under the care of Dr. Kristin Bruins and we have been okayed to      reinitiate her heparin without a bolus tonight.  Plan would be for a      cardiac catheterization in the a.m. to evaluate her coronary anatomy      with subsequent AVR on Wednesday under the care of Dr. Tyrone Sage.  2.  Status post cerebrovascular accident April 2006.  Will reinitiate her      heparin tonight.  She was taking Coumadin at home which we bridged with      Lovenox.  Will continue Statin therapy.  3.  Hypertension.  Continue Diovan/HCT.  Blood pressure stable.  4.  Hyperlipidemia.  Continue Statin therapy.  5.  Anxiety.  Will continue her triazolam at night.      Ok [name], NP      Jesse Sans. Wall, M.D.  Electronically Signed    CRB/MEDQ  D:  03/23/2005  T:  03/23/2005  Job:  [id number]

## 2010-11-28 NOTE — Op Note (Signed)
NAMEJILLISA, Elizabeth Morton               ACCOUNT NO.:  [id number]   MEDICAL RECORD NO.:  [id number]          PATIENT TYPE:  AMB   LOCATION:  SDS                          FACILITY:  [facility]   PHYSICIAN:  Charlynne Pander, D.D.S.DATE OF BIRTH:  08-04-1955   DATE OF PROCEDURE:  03/23/2005  DATE OF DISCHARGE:                                 OPERATIVE REPORT   PREOPERATIVE DIAGNOSES:  1.  Severe aortic stenosis.  2.  Preaortic valve replacement dental protocol.  3.  Status post acute in April 2006.  4.  Chronic apical periodontitis.  5.  Chronic periodontitis.  6.  Multiple retained roots.  7.  Maxillary left hyperplastic tuberosity.   POSTOPERATIVE DIAGNOSES:  1.  Severe aortic stenosis.  2.  Preaortic valve replacement dental protocol.  3.  Status post stroke in April2006.  4.  Chronic apical periodontitis.  5.  Chronic periodontitis.  6.  Multiple retained roots.  7.  Maxillary left hyperplastic tuberosity.   OPERATIONS:  1.  Extraction of remaining teeth (tooth numbers 5, 6, 7, 11, 12, 18, 19,      20, 21, 22, 27, 28, 29, 30 and 31).  2.  Three quadrants of alveoloplasty.  3.  Upper left quadrant maxillary fibrous tuberosity reduction.   SURGEON:  Charlynne Pander, D.D.S.   ASSISTANT:  Elliot Dally (Sales executive).   ANESTHESIA:  Monitored anesthesia care per the Anesthesia Team - Dr. Katrinka Blazing  attending.   MEDICATIONS:  1.  Ampicillin 2.0 grams IV prior to invasive dental procedures.  2.  Local anesthesia with a total utilization of six carpules each      containing 36 milligrams of Xylocaine with 0.018 milligrams of      epinephrine as well as two carpules containing 9 milligrams of      bupivacaine with 0.009 milligrams of epinephrine.   SPECIMENS:  There were 15 teeth which were discarded.   CULTURES:  None.   DRAINS:  None.   COMPLICATIONS:  None.   ESTIMATED BLOOD LOSS:  Less than 50 mL.   FLUIDS:  275 mL of normal saline solution.   INDICATIONS:  The  patient had severe aortic stenosis with anticipated aortic  valve replacement heart surgery. The dental consultation was requested to  rule out dental infection which would affect the patient's systemic health  and anticipated aortic valve replacement heart surgery. The patient was  examined and treatment planned for extraction of remaining teeth with  alveoloplasty and upper left maxillary fibrous tuberosity reduction. This  treatment plan was formulated to decrease the risk and complications  associated with dental infection from affecting the patient's systemic  health, anticipated heart valve surgery as well as to assist in  prosthodontic rehabilitation.   OPERATIVE FINDINGS:  The patient was examined in operating room #2. The  teeth were identified for extraction. The patient was noted to be affected  by chronic apical periodontitis, chronic periodontitis, multiple retained  root segments, hyperplastic maxillary left tuberosity. The aforementioned  necessitated removal of all remaining teeth with alveoloplasty in the upper  left maxillary tuberosity reduction.   DESCRIPTION OF  PROCEDURE:  The patient was brought to the main operating  room #2. The patient was then placed in the supine position operating room  table. Monitored anesthesia care was then induced per the Anesthesia Team.  The patient was then prepped and draped in usual manner for dental medicine  procedure. The oral cavity was thoroughly examined with the findings as  noted above. The patient was then ready for the dental medicine procedure as  follows:   Local anesthesia was administered sequentially over the 2-hour long  procedure with a total utilization of six carpules each containing 36  milligrams Xylocaine with 0.018 milligrams of epinephrine as well as two  carpules containing 9 milligrams of bupivacaine with 0.009 milligrams of  epinephrine.   The maxillary quadrants were first approached. Anesthesia was  delivered with  infiltration.  The maxillary right quadrant was first approached. Tooth  numbers 5, 6, 7 were then approached. These teeth were then removed with a  150 forceps without complications. Minor alveoloplasty was then performed  utilizing rongeurs and bone file. The surgical site was then irrigated with  copious amounts sterile saline. A piece of Surgicel was then placed in each  extraction socket appropriately. The surgical site was then closed utilizing  3-0 chromic gut suture material in a continuous interrupted suture technique  from the distal #5 through the mesial of number 7.  Three separate  interrupted sutures were then utilized to further close the surgical site.   The maxillary left quadrant was then approached, 15 blade incision was made  from the maxillary left tuberosity through the mesial of number 14.  Fibrous  tuberosity reduction was then achieved with the removal of several sections  of the hyperplastic tissue utilizing a 15 blade and soft tissue pickups. The  tissues were approximated and trimmed appropriately. The surgical site was  then irrigated with copious amounts of sterile saline. The surgical site was  then closed from the maxillary left tuberosity through the mesial of #14  utilizing 3-0 chromic gut suture and a continuous interrupted suture  technique x one. Tooth numbers 11 and 12 were then removed with a 150  forceps without complications. The sockets were curetted and compressed  appropriately. A piece of Surgicel was placed in each extraction socket  appropriately. The surgical site was then closed in the area of #11 and 12  utilizing 3-0 chromic gut suture in a continuous interrupted suture  technique from the mesial of #11 to the distal of #12.   The mandibular quadrants were then approached. Bilateral inferior alveolar  nerve blocks were given with the bupivacaine appropriately. Infiltration was also achieved with Xylocaine with epinephrine  anesthetic. The mandibular  left quadrant was then approached. Tooth numbers 18, 19, 20, 21, 22 were  then removed with a 151 forceps without complications. Alveoplasty was then  performed utilizing rongeurs and bone file.  The surgical site was then  irrigated with copious amounts sterile saline. A piece of Surgicel was  placed in each extraction socket appropriately. The surgical site was then  closed from the distal #18 through the mesial of #22 utilizing 3-0 chromic  gut suture in a continuous interrupted suture technique x one. Several  additional interrupted sutures were then placed to further close the  surgical site.   The mandibular right quadrant was then approached tooth numbers 27, 28, 29,  30, 31 were then removed with a 151 forceps without complications.  Alveoplasty was then performed utilizing rongeurs and bone file. The  tissues  were approximated and trimmed appropriately. The surgical site was then  irrigated with copious amounts sterile saline. The surgical site was then  closed with 3-0 chromic gut suture in a continuous interrupted suture  technique x one from the distal #31 through the mesial of #27. This was  after Surgicel was placed in each extraction socket appropriately. At this  point the entire mouth was irrigated with copious amounts of sterile saline.  The patient was examined for complications.  Seeing none, dental medicine  procedure was deemed to be complete. A series of 4x4 gauze were placed in  the mouth to aid hemostasis. The patient was then handed over to the  anesthesia team for final disposition. After appropriate amount of time, the  patient was taken to the post anesthesia care unit with stable vital signs  and good oxygenation level. The patient will continue use of 4x4 gauzes as  well as the use of Amicar 5% rinse rinsing with 10 mL every hour for the  next 10 hours. The patient will then be admitted by Cardiology for further  follow-up. The  patient is planned to have a cardiac catheterization  March 24, 2005 followed by appropriate aortic valve replacement on  March 25, 2005 with Dr. Laneta Simmers as indicated.      Charlynne Pander, D.D.S.  Electronically Signed     RFK/MEDQ  D:  03/23/2005  T:  03/23/2005  Job:  [id number]   cc:   Learta Codding, M.D. [facility]  [address]  [address]  [place]  [place] [address]   Pramod P. Pearlean Brownie, MD  Fax: [phone]   Evelene Croon, M.D.  [address]  [place]  [place] [address]  Fax: [phone]

## 2010-11-28 NOTE — Op Note (Signed)
NAMESANDRINE, BLOODSWORTH               ACCOUNT NO.:  [id number]   MEDICAL RECORD NO.:  [id number]          PATIENT TYPE:  OIB   LOCATION:  2316                         FACILITY:  [facility]   PHYSICIAN:  Evelene Croon, M.D.     DATE OF BIRTH:  April 06, 1956   DATE OF PROCEDURE:  03/25/2005  DATE OF DISCHARGE:                                 OPERATIVE REPORT   PREOPERATIVE AND POSTOPERATIVE DIAGNOSES:  1.  Severe aortic stenosis with bicuspid aortic valve.  2.  Single-vessel coronary disease.   OPERATIVE PROCEDURE:  Median sternotomy, extracorporeal circulation,  coronary bypass graft surgery x2 using a left internal mammary artery graft  to left anterior descending coronary, with a saphenous vein graft to the  diagonal branch of the LAD, replacement of aortic valve and ascending aortic  aneurysm using a 21-mm St. Jude mechanical valve conduit with reimplantation  of the right and left coronary arteries under deep hypothermic circulatory  arrest (Bentall procedure), endoscopic vein harvesting from the left leg.   ATTENDING SURGEON:  Evelene Croon, M.D.   ASSISTANT:  Salvatore Decent. Cornelius Moras, M.D.   SECOND ASSISTANT:  Coral Ceo, P.A.   ANESTHESIA:  General endotracheal.   CLINICAL HISTORY:  This patient is a 55 year old woman who suffered a right  middle cerebral artery stroke on November 04, 2004 with slurred speech and left  hand clumsiness. The echocardiogram at that time showed severe aortic  stenosis with a bicuspid aortic valve with an aortic valve area 0.6-0.7 cm2.  The etiology of __________ was felt the thrombus in her aortic arch which  was seen by TEE. She has been treated with Coumadin since without any  further evidence of TIA or stroke. She was seen by Dr. Tyrone Sage for  consideration of AVR. She has had exertional dyspnea with chest pain and  fatigue. Cardiac catheterization showed 60-70% mid proximal LAD stenosis at  50-60% proximal diagonal stenosis. There was also an ascending  aortic  aneurysm was noted that appeared to begin at the level of sinotubular  junction and extended up to the innominate level. She did have a  preoperative MR angiogram of the chest which showed the maximum dimension of  the ascending aorta to be about 3.8 cm with the caliber of her descending  aorta being about 1.5 cm. She is a relatively small lady with a BSA of 1.47  and therefore, although her ascending aorta was only 3.8 cm it was  aneurysmal. Given her history of a bicuspid valve, the incidence of aneurysm  was felt to be significantly higher. She did have full dental extractions  two days prior to having her cardiac surgery and Dr. Robin Searing felt that we  could proceed with her surgery. I had discussed the operative procedure of  aortic valve replacement and replacement of her ascending aorta either using  a supervalvar tube graft or a valve conduit. Given her age, I felt a  mechanical valve would be the best choice for her and she said that she  would like to have a mechanical valve if possible. I discussed the  possibility that we may  need to use a homograft. I discussed performing  coronary bypass surgery and the use of deep hypothermic circulatory arrest  to replace her aorta. I discussed the benefits and risks including bleeding,  blood transfusion, infection, stroke, myocardial infarction, graft failure,  and death. Her and her aunt understood and agreed to proceed.   OPERATIVE PROCEDURE:  The patient was taken to the operating room and placed  on the table in supine position. After induction of general endotracheal  anesthesia, a Foley catheter was placed in the bladder using sterile  technique. Preoperative intravenous vancomycin and Zinacef were given.  Transesophageal echocardiogram was performed by anesthesiology.  This showed  severe aortic stenosis with a bicuspid aortic valve. There was an ascending  aortic aneurysm that appeared to be around the level of the  sinotubular  junction and extended almost to the innominate artery level. There was no  thrombus seen within the ascending aorta or aortic arch. There was mild  mitral regurgitation. Left ventricular function appeared well-preserved.   Then the chest, abdomen and both lower extremities were prepped and draped  in usual sterile manner. The chest was entered through a median sternotomy  incision. The pericardium opened in the midline. Examination heart showed  good ventricular contractility. There is a surprisingly large amount of  epicardial fat on the heart, given the patient's small size. The ascending  aorta was grossly aneurysmal and this appeared to begin at the level of  sinotubular junction and extended to the takeoff of the innominate artery.  The aorta appeared to be of good quality.   Then the left internal mammary artery was harvested from the chest as a  pedicle graft. This was a medium caliber vessel with excellent blood flow  through it. At the same time a segment of greater saphenous vein was  harvested from the left leg using endoscopic vein harvest technique. This  vein was of medium size and good quality. We initially tried to find a  saphenous vein adjacent to the right knee but it was very small and  unsuitable.   Then the patient was heparinized and when an adequate activated clotting  time was achieved, the ascending aorta was cannulated using a 20-French  aortic cannula for arterial inflow.  Venous outflow was achieved using a two-  stage venous cannula for the right atrial appendage. Antegrade cardioplegia  and vent cannula was inserted in the aortic root. A retrograde cardioplegia  cannula was inserted through a pursestring suture in the right atrium and  advanced up into the superior vena cava to give retrograde cerebroplegia. A  left ventricular vent was placed in the right superior pulmonary vein.  The patient was then placed on cardiopulmonary bypass and the  distal  coronaries identified. The LAD and diagonal branch were both intramyocardial  for most of their extent but were located in their mid portions where they  were suitable for grafting.   Then the aorta was crossclamped and 500 cc of cold blood antegrade  cardioplegia was administered in the aortic root with quick arrest of the  heart. Systemic hypothermia to 18 degrees centigrade was initiated. The  patient was given Pentothal and Solu-Medrol for the hypothermic circulatory  arrest protocol by anesthesiology. Topical hypothermia with iced saline was  used. Temperature probe was placed in the septum and insulating pad in the  pericardium.   Then the first distal anastomosis was performed to the diagonal branch. The  internal diameter of this vessel was about 1.6 mm.  Conduit used was a  segment of greater saphenous vein and the anastomosis performed in end-to-  side manner using continuous 7-0 Prolene suture.  Flow was measured through  the graft and was excellent.   Second distal anastomosis was performed to the LAD. The internal diameter  was about 1.75 mm. Conduit used was a left internal mammary graft and this  was brought through an opening in the left pericardium anterior to the  phrenic nerve.  It was anastomosed to the LAD in end-to-side manner using  continuous 8-0 Prolene suture. The pedicle was sutured to the epicardium  with 6-0 Prolene sutures.  Then the patient was given another dose of  cardioplegia into the aortic root. By this time the patient's bladder  temperature and rectal temperature had both decreased to 18 degrees  centigrade. The head was then placed in the Trendelenburg position.  Circulatory arrest was initiated. The aorta crossclamp and aortic cannula  were removed from the ascending aorta. The retrograde cerebroplegia was  initiated through the superior vena cava cannula and monitored by perfusion.  The superior vena cava was encircled with a occlusive  tape. Then the aorta  was transected just before the takeoff of the innominate artery. Examination  of the interior of the aortic arch showed no visible thrombus or plaque  within the lumen.  Then a 28 mm Hemashield tube graft with a 10 mm sidearm  was cut the appropriate length and anastomosed end-to-end to the  undersurface of the aortic arch using continuous 3-0 Prolene suture with a  felt strip to reinforce the anastomosis. This anastomosis was lightly  covered with BioGlue for hemostasis. Then the graft was de-aired and  circulation was reestablished. A crossclamp was placed across the aortic  graft just proximal to the sidearm. Arterial flow was reestablished using  the sidearm. Total circulatory arrest time was 21 minutes. The distal  anastomosis appeared fairly hemostatic. Then the retrograde cannula was  repositioned into the coronary sinus and retrograde cardioplegia was given at about 20-minute intervals to maintain myocardial temperature around 10  degrees centigrade.   Attention was then turned to aortic valve replacement. The aorta was opened  down to the level of the sinotubular junction. Examination of the native  valve showed there was a functionally bicuspid valve with fusion of the  commissure between the left and right cusps. It was a severely stenotic  valve. The right and left coronary ostia were identified. The right coronary  ostia was lying low and quite close to the annulus and the left coronary  cusp was located quite high within the sinus almost to the sinotubular  junction. I felt that a 19-mm St. Jude Regent valve would probably fit and  then a series of pledgeted 2-0 on horizontal mattress sutures placed around  the annulus with pledgets in a subannular position. The sutures were placed  through the sewing ring of the valve. The valve lowered into place. These  valve sutures were tied but it became apparent that the valve was not going  to meet the annulus  particularly along the noncoronary sinus. There still  appeared to be about a centimeter distance between the cuff and the annulus  and I could not get this to fit. After trying multiple maneuvers, I felt it  would be best to take this out and plan to use a valve conduit. Therefore  the 19-mm St. Jude valve was removed. Care was taken to remove all sutures  and pledget material.  Then the right and left coronary ostia were excised as  buttons. Care was taken to prevent rotation. The excess aortic sinus tissue  was excised. Then the annulus was again sized and a 21 mm St. Jude sizer  would just barely fit within the annulus. Therefore I felt that a 21-mm St.  Jude mechanical valve conduit would likely fit. This had model number  [id number] model number 16109604. Then a series of pledgeted 2-0 Ethibond  horizontal mattress sutures placed around the annulus with pledgets in a  subannular position. The sutures were then placed through a gusset of  autologous pericardium and then through the sewing ring. The valve conduit  was lowered into place. The sutures were tied sequentially and the valve  appeared to seat nicely. The proximal suture line was then reinforced with  BioGlue for hemostasis.   Then the left coronary button was anastomosed to the side of the graft in  end-to-side manner using continuous 5-0 Prolene suture. This anastomosis was  also lightly covered with BioGlue for hemostasis. Then the right coronary  button was anastomosed to the anterior surface of the graft in an end-to-  side manner using continuous 5-0 Prolene suture. BioGlue was placed around  this anastomosis for hemostasis. Then the patient was rewarmed 37 degrees  centigrade. The two grafts were then cut to the appropriate length and  anastomosed end-to-end using continuous 3-0 Prolene suture. This anastomosis  was also coated with BioGlue for hemostasis. Then the proximal vein graft anastomosis was performed to the  ascending aortic graft in end-to-side  manner using continuous 6-0 Prolene suture. This anastomosis was also  lightly coated with BioGlue for hemostasis. Then the head was placed in  Trendelenburg position. The clamp removed from mammary pedicle. There is  rapid warming of the ventricular septum and return of spontaneous  ventricular fibrillation. Then the left side of the heart was further de-  aired and the crossclamp removed with a time of 216 minutes. There is  spontaneous return of sinus rhythm. The proximal and distal anastomoses  appeared hemostatic. Line of the graft satisfactory. The graft marker was  placed from the proximal anastomosis. Two temporary right ventricular and  right atrial pacing wires placed and brought out through the skin.   The patient rewarmed 37 degrees centigrade, she was weaned from  cardiopulmonary bypass on no inotropic agents. Total bypass time was 247  minutes. Cardiac function appeared excellent with a cardiac output of 4.5  liters minute. Transesophageal echocardiogram showed normal functioning St.  Jude valve prosthesis. There was mild mitral regurgitation that was  unchanged. Left ventricular function appeared well-preserved. Protamine was  then given. The patient was given platelets and fresh frozen plasma for  coagulopathy since she had required a fair amount blood transfusion for the  surgery. The cannulas were then removed. The sidearm of the ascending aortic  graft was suture ligated with double layer of 4-0 Prolene suture. Hemostasis  was achieved. Three chest tubes were placed with a tube in the posterior  pericardium, one in the left pleural space and one in the anterior  mediastinum. The pericardium could not be reapproximated over the heart. The  sternum was closed #6 stainless steel wires. Fascia closed with continuous  #1 Vicryl suture. Subcutaneous tissue was closed with continuous 2-0 Vicryl  and skin with 3-0 Vicryl subcuticular  closure. The lower extremity vein  harvest site was closed in layers in a similar manner. The sponge, needle  and instrument counts correct according  to scrub nurse. Dry sterile  dressings were applied over the incisions, around the chest tubes which were  hooked to Pleur-Evac suction. The patient remained hemodynamically stable,  was transported to the SICU in guarded but stable condition.      Evelene Croon, M.D.  Electronically Signed     BB/MEDQ  D:  03/25/2005  T:  03/26/2005  Job:  [id number]   cc:   [facility]   Cath lab

## 2010-11-28 NOTE — Consult Note (Signed)
NAMEDORINA, Elizabeth Morton               ACCOUNT NO.:  [id number]   MEDICAL RECORD NO.:  [id number]          PATIENT TYPE:  OIB   LOCATION:  2316                         FACILITY:  [facility]   PHYSICIAN:  Evelene Croon, M.D.     DATE OF BIRTH:  [birth year]   DATE OF CONSULTATION:  03/24/2005  DATE OF DISCHARGE:                                   CONSULTATION   REFERRING PHYSICIAN:  Dr. Andee Lineman.   REASON FOR CONSULTATION:  Severe aortic stenosis with bicuspid aortic valve  and single vessel coronary disease as well as ascending aortic aneurysm.   HISTORY OF PRESENT ILLNESS:  This patient was referred by Dr. Andee Lineman for  consideration of surgery for single-vessel coronary disease with severe  aortic stenosis and ascending aortic aneurysm. She is a 55 year old woman  who suffered a thromboembolic stroke in the right middle cerebral artery  distribution on November 05, 2004. She presented with a 24-hour history of  slurred speech and left hand clumsiness and weakness. Evaluation at that  time showed clot in the right middle cerebral artery with infarction  involving the frontal and temporal lobes. MRI of the brain apparently showed  complete occlusion of the right middle cerebral artery. She had an  echocardiogram at that time showed severe aortic stenosis and a  transesophageal echocardiogram suggested a clot in the aortic arch with  bicuspid aortic valve and critical aortic stenosis. She was started on  Coumadin and treated medically. She has had no further TIA or stroke  symptoms. She has had ongoing fatigue with minimal exertion as well as  exertional shortness of breath.   She was seen by Dr. Tyrone Sage on March 05, 2005 and he felt the patient  should be considered for surgical treatment, but required dental extractions  prior to performing valve replacement surgery. She was referred to Dr.  Kristin Bruins and she underwent multiple extractions of her remaining teeth on  March 23, 2005. She was  subsequently transferred to [facility]  and underwent cardiac catheterization on March 24, 2005. This showed an  ascending aortic aneurysm beginning just above the sinotubular junction  extending up to the takeoff of the innominate artery. There was single-  vessel coronary disease with a 60-70% mid LAD and proximal LAD stenosis and  a 50-60% proximal diagonal stenosis. The left circumflex and right coronary  arteries were normal. Left and right heart pressures were within normal  limits. Ventriculogram was not performed since she had known normal left  ventricular function. She had undergone a MR angiogram recently of her  ascending aorta which showed that the maximum dimension of her ascending  aorta measuring about 3.8 cm compared to the normal side of her descending  aorta being about 1.5 cm. Her BSA was only 1.47 centimeter squared. So it  was felt that this was definitely an aneurysm and given her history of  bicuspid valve which was associated with ascending aneurysm, it was felt  that this should be replaced. On further questioning, she reports having  frequent episodes of exertional fatigue and feeling that she just cannot  keep going  any longer. She has had some exertional shortness of breath. She  denies any chest pain or pressure.   REVIEW OF SYSTEMS:  GENERAL:  She denies any fever or chills. She has had  fatigue. She denies any weight changes. EYES:  Negative. ENT:  Negative. She  just had full dental extractions yesterday. CARDIOVASCULAR:  As above. She  denies any chest pain or pressure. She has had exertional dyspnea and  shortness of breath. She denies peripheral edema. She denies palpitations.  She denies orthopnea and PND. RESPIRATORY:  She denies cough and sputum  production. GI:  She denies nausea, vomiting. She has had no melena or  bright red blood per rectum. GU:  She denies dysuria and hematuria.  MUSCULOSKELETAL:  Negative. HEMATOLOGICAL:  She denies  a history of bleeding  disorder or easy bleeding. PSYCHIATRIC:  Negative.   ALLERGIES:  None.   SOCIAL HISTORY:  She is married and has one son who is 30 years old. She has  a history of smoking one pack per day x30 years but quit in April after a  stroke. She denies alcohol intake at this time.   FAMILY HISTORY:  Her mother is alive at age 105 with a history of stroke.  Father's died at age 54 hypertension and possible stroke.   PHYSICAL EXAMINATION:  VITAL SIGNS:  Her blood pressure was 100/64 and pulse  90 and regular. Respiratory rate is 18 and unlabored. She is afebrile.  GENERAL:  She is a small frame and white female in no distress.  HEENT:  Exam shows be normocephalic and atraumatic. Pupils are equal,  reactive to light accommodation. Extraocular muscles are intact. Her oral  cavity shows some oozing blood from her gums. They are packed with Surgicel  gauze.  NECK:  Exam shows normal carotid pulses bilaterally. There is a transmitted  murmur both sides of her neck. There is no adenopathy or thyromegaly.  CARDIAC:  Exam shows regular rate and rhythm with a harsh 3/6 systolic  murmur over the aorta.  LUNGS:  Her lungs were clear.  ABDOMINAL:  Exam shows active bowel sounds. Abdomen is soft, flat, and  nontender. There are no palpable masses or organomegaly.  EXTREMITIES:  Exam shows no peripheral edema. Pedal pulses palpable  bilaterally.  SKIN:  Warm and dry.  NEUROLOGIC:  Exam shows her to be alert and oriented x3. Motor and sensory  exams are grossly normal.   IMPRESSION:  Elizabeth Morton has severe aortic stenosis with bicuspid aortic  valve and ascending aortic aneurysm as well as single-vessel coronary  disease. I agree the best treatment is to replace her aortic valve and  ascending aorta and perform coronary bypass to her left anterior descending  artery and diagonal. I discussed the operative procedure with her and her family including the use of a mechanical valve if  possible given her age of  55 years and the likelihood that she would need to continue on Coumadin with  her history of thromboembolic stroke. She said that she would rather have a  mechanical valve for longevity if possible. I discussed alternatives,  benefits, and risks including bleeding, blood transfusion, infection,  stroke, myocardial infarction, graft failure, and death. She understands and  would like to proceed with surgery tomorrow. We will plan to do surgery on  March 25, 2005.      Evelene Croon, M.D.  Electronically Signed     BB/MEDQ  D:  03/25/2005  T:  03/25/2005  Job:  [id number] 

## 2010-11-28 NOTE — Discharge Summary (Signed)
Elizabeth Morton, Elizabeth Morton               ACCOUNT NO.:  [id number]   MEDICAL RECORD NO.:  [id number]          PATIENT TYPE:  INP   LOCATION:  3019                         FACILITY:  [facility]   PHYSICIAN:  Pramod P. Pearlean Brownie, MD    DATE OF BIRTH:  1955-11-07   DATE OF ADMISSION:  11/05/2004  DATE OF DISCHARGE:  11/11/2004                                 DISCHARGE SUMMARY   DISCHARGE DIAGNOSES:  1.  Large right middle cerebral artery embolic infarct secondary to aortic      thrombus.  2.  Severe aortic stenosis with need for valve replacement within the next      months.  3.  Poor dentition.  4.  Hyperlipidemia.  5.  Cigarette smoker.  6.  Atrioseptal aneurysm.   DISCHARGE MEDICATIONS:  1.  Lovenox 60 mg q.12 h. for five days.  2.  Coumadin 7.5 mg on Nov 11, 2004, then 5 mg daily.  3.  Zocor 40 mg at bedtime.  4.  Nicotine patch 7 mg a day for 14 days.   STUDIES PERFORMED:  1.  CT scan of the head on admission shows findings consistent with thrombus      in the right middle cerebral artery with a non-hemorrhagic stroke in      evolution within the right MCA.  2.  MRI of the brain shows a large right MCA territory infarct affecting the      frontal temporal and inferior cortex.  3.  MRA of the brain shows a complete occlusion of the right MCA.  4.  CT scan angio of the head and neck shows evolution of the right MCA      infract, less than 50% stenosis right internal carotid artery with      calcified plaque.  Partial lysis of __________ thrombus in the distal      right M1 segment with reconstitution beyond the right MCA bifurcation in      part from proximal source and part from collaterals.  Marked      intravascular enhancement throughout the right cerebral hemispheres.      Right MCA infarct involving the __________ cortex.  Mild edema or      forceps cerebral hemisphere with effacement of the sulci.  5.  CT scan angio of the chest and abdomen shows no evidence of aortic  dissection.  However, there is moderate dilatation of ascending aorta.      No pulmonary emboli.  6.  Carotid Doppler is normal.  7.  2-D echocardiogram showed ejection fraction of 55-75% with no left      ventricular regional wall motion abnormality.  There is severe aortic      valve stenosis.  There was an atrial septal aneurysm.  SBE prophylaxis      recommended.  8.  EKG shows normal sinus rhythm with ST wave abnormality, possible      digitalis effect.  9.  Transesophageal echocardiogram performed by Dr. Lewayne Bunting.  It showed      severe aortic stenosis with bicuspid aortic valve, aortic      thrombus/plaque extending  from aortic arch to proximal descending aorta      without definite dissection, could be source of embolus.   LABORATORY DATA:  Antithrombin III is 85, protein C 177, protein S 97, lupus  anticoagulant not detected.  Factor V leiden cardiolipid antibody and D2021A  are all pending.  CBC with normal differential was normal.  Coagulation  studies were normal except for PT on admission at 11.1.  Chemistry with  hemolyzed potassium at 5.6, BUN 2, calcium 8.1, total protein 5.3, and  albumin 2.5.  Repeat potassium dropped into 3.2.  Liver function tests with  AST slightly elevated at 42, ALT 22, ALP 91, total bilirubin 0.7.  Hemoglobin A1C was normal at 4.9, homocystine at normal at 12.09.  Cholesterol 168, triglycerides 139, HDL 39, LDL 101.  Her urine drug screen  was positive for benzodiazepines.  It has 0-2 white blood cells, red blood  cells and rare bacteria.  Blood cultures x2 were negative for growth.   HISTORY OF PRESENT ILLNESS:  Elizabeth Morton is a 55 year old, right-  handed white female with no known chronic medical problem.  She was talking  with her friend on the phone the day prior to admission, about 1:25, when  she experienced sudden onset of slurred speech.  At that time she also  noticed her left hand was clumsy and she was unable to hold  anything.  The  patient stayed to monitor her symptoms and did not seek medical attention at  that time.  Later that day she noticed onset of severe headache.  She came  to the emergency room the day of admission for evaluation where she noted to  have left-sided deficits and the CT scan of the head demonstrated an acute  right brain stroke.  She was then admitted to the hospital for further  evaluation and work-up.  She was not a TPA candidate secondary to time.   HOSPITAL COURSE:  The patient had extensive work-up including MRI showing a  positive stroke.  Stroke was felt to be embolic secondary to aortic thrombus  found during TEE by Dr. Lewayne Bunting with recommendations for Coumadin.  Also  on TEE and 2-D echo was noticed severe aortic stenosis which will probably  require replacement within the next six months of an aortic valve.  Of note,  she has horrible dentition and inpatient dental consult was made for her.  A  hypocoagulable work-up still pending to rule out clotting disorder.  Plans  are to put the patient on Coumadin and discharge her home.  She will be  administer her own Lovenox injections until she is therapeutic.  She will be  followed by the Coumadin clinic at [facility].  She will need followup with a  primary physician, cardiologist and neurology.  She will receive outpatient  PT and OT therapy for her deficits.  Other risk factors may include that of  high cholesterol for which she was started on Zocor.  This will need  followup in four to six weeks.   CONDITION ON DISCHARGE:  The patient is alert and oriented x3.  Speech is  clear. She has poor dentition.  She has no expressive or receptive aphasia.  Her face is symmetric.  Her visual fields are full.  Her chest is clear to  auscultation.  Her heart rate is regular.  She has mild left upper extremity  hemiparesis, 4/5, with decreased fine motor movement.  Her gait is steady.  Her strength is normal.  FOLLOW UP:  1.   Coumadin for secondary stroke prevention.  Will go home on Lovenox      bridged to Coumadin with followup at [facility].  2.  Stop smoking on new patch and had cessation counseling in the hospital.  3.  Followup with Dr. Andee Lineman.  4.  Zocor.  Will need followup of liver function tests and cholesterol in      four to six weeks.  5.  Outpatient PT and OT.  6.  The patient asked to get a primary care physician.  7.  Follow up with Dr. Pearlean Brownie in two to three months.      SB/MEDQ  D:  11/11/2004  T:  11/11/2004  Job:  [id number]   cc:   Learta Codding, M.D. [facility]

## 2010-11-28 NOTE — H&P (Signed)
Elizabeth Morton, ALKINS               ACCOUNT NO.:  [id number]   MEDICAL RECORD NO.:  [id number]          PATIENT TYPE:  AMB   LOCATION:  SDS                          FACILITY:  [facility]   PHYSICIAN:  Ok [name], NPDATE OF BIRTH:  Mar 16, 1956   DATE OF ADMISSION:  03/23/2005  DATE OF DISCHARGE:                                HISTORY & PHYSICAL   No dictation for this job.      Ok [name], NP     CRB/MEDQ  D:  03/23/2005  T:  03/23/2005  Job:  [id number]

## 2010-11-28 NOTE — Cardiovascular Report (Signed)
Elizabeth Morton, [name]               ACCOUNT NO.:  [id number]   MEDICAL RECORD NO.:  [id number]          PATIENT TYPE:  OIB   LOCATION:  2028                         FACILITY:  [facility]   PHYSICIAN:  Charlton Haws, M.D.     DATE OF BIRTH:  08/19/1955   DATE OF PROCEDURE:  03/24/2005  DATE OF DISCHARGE:                              CARDIAC CATHETERIZATION   PROCEDURE:  Coronary aortography.   INDICATIONS:  Previous CVA and known severe aortic stenosis, open-heart  surgery by Dr. Tyrone Sage tomorrow.   Catheterization was done from the right femoral artery and vein. The patient  had LAO aortography performed. There is moderate aortic root enlargement.  The patient will need a graft conduit performed due to the aneurysmal  dilatation post aortic stenosis.   Left main coronary was normal.   Left anterior descending artery has 60-70% discrete lesion in the mid vessel  just after the takeoff of first diagonal branch. First diagonal branch has  50-60% proximal disease.   Circumflex coronary artery was normal.   Right coronary artery is normal.   Right heart pressure showed the patient to be somewhat dehydrated.  Mean  right atrial pressure was 5,  RV pressures 21/5.  PA pressure was 20/9.  Pulmonary capillary wedge pressure was 5. Aortic pressure is 115/90.   Because of the patient's previous CVA and a question of thrombus in the  aortic root, we did not manipulate the catheters very much and did not  attempt to cross the aortic valve.   PLAN:  The patient is apparently to have open heart surgery tomorrow with  Dr. Tyrone Sage. She will need a CABG with an Bentall procedure.  We will call [facility] to let them know the catheterization was done.           ______________________________  Charlton Haws, M.D.     PN/MEDQ  D:  03/24/2005  T:  03/24/2005  Job:  [id number]   cc:   Learta Codding, M.D. [facility]  [address]  [address]  [place]  [place] [address]

## 2010-12-09 ENCOUNTER — Ambulatory Visit (INDEPENDENT_AMBULATORY_CARE_PROVIDER_SITE_OTHER): Payer: 59 | Admitting: *Deleted

## 2010-12-09 DIAGNOSIS — Z7901 Long term (current) use of anticoagulants: Secondary | ICD-10-CM

## 2010-12-09 DIAGNOSIS — Z954 Presence of other heart-valve replacement: Secondary | ICD-10-CM

## 2010-12-09 DIAGNOSIS — I6350 Cerebral infarction due to unspecified occlusion or stenosis of unspecified cerebral artery: Secondary | ICD-10-CM

## 2010-12-09 DIAGNOSIS — I359 Nonrheumatic aortic valve disorder, unspecified: Secondary | ICD-10-CM

## 2010-12-30 ENCOUNTER — Ambulatory Visit (INDEPENDENT_AMBULATORY_CARE_PROVIDER_SITE_OTHER): Payer: 59 | Admitting: *Deleted

## 2010-12-30 DIAGNOSIS — Z7901 Long term (current) use of anticoagulants: Secondary | ICD-10-CM

## 2010-12-30 DIAGNOSIS — I359 Nonrheumatic aortic valve disorder, unspecified: Secondary | ICD-10-CM

## 2010-12-30 DIAGNOSIS — I6350 Cerebral infarction due to unspecified occlusion or stenosis of unspecified cerebral artery: Secondary | ICD-10-CM

## 2010-12-30 DIAGNOSIS — Z954 Presence of other heart-valve replacement: Secondary | ICD-10-CM

## 2010-12-30 LAB — POCT INR: INR: 3.8

## 2011-01-20 ENCOUNTER — Ambulatory Visit (INDEPENDENT_AMBULATORY_CARE_PROVIDER_SITE_OTHER): Payer: 59 | Admitting: *Deleted

## 2011-01-20 DIAGNOSIS — I6350 Cerebral infarction due to unspecified occlusion or stenosis of unspecified cerebral artery: Secondary | ICD-10-CM

## 2011-01-20 DIAGNOSIS — Z954 Presence of other heart-valve replacement: Secondary | ICD-10-CM

## 2011-01-20 DIAGNOSIS — I359 Nonrheumatic aortic valve disorder, unspecified: Secondary | ICD-10-CM

## 2011-01-20 DIAGNOSIS — Z7901 Long term (current) use of anticoagulants: Secondary | ICD-10-CM

## 2011-01-20 LAB — POCT INR: INR: 2.1

## 2011-02-17 ENCOUNTER — Ambulatory Visit (INDEPENDENT_AMBULATORY_CARE_PROVIDER_SITE_OTHER): Payer: 59 | Admitting: *Deleted

## 2011-02-17 DIAGNOSIS — I6350 Cerebral infarction due to unspecified occlusion or stenosis of unspecified cerebral artery: Secondary | ICD-10-CM

## 2011-02-17 DIAGNOSIS — Z7901 Long term (current) use of anticoagulants: Secondary | ICD-10-CM

## 2011-02-17 DIAGNOSIS — Z954 Presence of other heart-valve replacement: Secondary | ICD-10-CM

## 2011-02-17 DIAGNOSIS — I359 Nonrheumatic aortic valve disorder, unspecified: Secondary | ICD-10-CM

## 2011-03-14 ENCOUNTER — Other Ambulatory Visit: Payer: Self-pay | Admitting: Cardiology

## 2011-03-24 ENCOUNTER — Ambulatory Visit (INDEPENDENT_AMBULATORY_CARE_PROVIDER_SITE_OTHER): Payer: 59 | Admitting: *Deleted

## 2011-03-24 DIAGNOSIS — Z7901 Long term (current) use of anticoagulants: Secondary | ICD-10-CM

## 2011-03-24 DIAGNOSIS — I359 Nonrheumatic aortic valve disorder, unspecified: Secondary | ICD-10-CM

## 2011-03-24 DIAGNOSIS — Z954 Presence of other heart-valve replacement: Secondary | ICD-10-CM

## 2011-03-24 DIAGNOSIS — I6350 Cerebral infarction due to unspecified occlusion or stenosis of unspecified cerebral artery: Secondary | ICD-10-CM

## 2011-04-06 ENCOUNTER — Other Ambulatory Visit: Payer: Self-pay | Admitting: Internal Medicine

## 2011-04-06 DIAGNOSIS — Z1231 Encounter for screening mammogram for malignant neoplasm of breast: Secondary | ICD-10-CM

## 2011-04-21 ENCOUNTER — Ambulatory Visit (INDEPENDENT_AMBULATORY_CARE_PROVIDER_SITE_OTHER): Payer: 59 | Admitting: *Deleted

## 2011-04-21 DIAGNOSIS — Z954 Presence of other heart-valve replacement: Secondary | ICD-10-CM

## 2011-04-21 DIAGNOSIS — I6350 Cerebral infarction due to unspecified occlusion or stenosis of unspecified cerebral artery: Secondary | ICD-10-CM

## 2011-04-21 DIAGNOSIS — I359 Nonrheumatic aortic valve disorder, unspecified: Secondary | ICD-10-CM

## 2011-04-21 DIAGNOSIS — Z7901 Long term (current) use of anticoagulants: Secondary | ICD-10-CM

## 2011-05-04 ENCOUNTER — Ambulatory Visit
Admission: RE | Admit: 2011-05-04 | Discharge: 2011-05-04 | Disposition: A | Discharge status: Home / Self Care | Payer: 59 | Source: Ambulatory Visit | Attending: Internal Medicine | Admitting: Internal Medicine

## 2011-05-04 DIAGNOSIS — Z1231 Encounter for screening mammogram for malignant neoplasm of breast: Secondary | ICD-10-CM

## 2011-05-12 ENCOUNTER — Ambulatory Visit (INDEPENDENT_AMBULATORY_CARE_PROVIDER_SITE_OTHER): Payer: 59 | Admitting: *Deleted

## 2011-05-12 DIAGNOSIS — I6350 Cerebral infarction due to unspecified occlusion or stenosis of unspecified cerebral artery: Secondary | ICD-10-CM

## 2011-05-12 DIAGNOSIS — I359 Nonrheumatic aortic valve disorder, unspecified: Secondary | ICD-10-CM

## 2011-05-12 DIAGNOSIS — Z7901 Long term (current) use of anticoagulants: Secondary | ICD-10-CM

## 2011-05-12 DIAGNOSIS — Z954 Presence of other heart-valve replacement: Secondary | ICD-10-CM

## 2011-05-12 LAB — POCT INR: INR: 2

## 2011-06-09 ENCOUNTER — Ambulatory Visit (INDEPENDENT_AMBULATORY_CARE_PROVIDER_SITE_OTHER): Payer: 59 | Admitting: *Deleted

## 2011-06-09 DIAGNOSIS — I359 Nonrheumatic aortic valve disorder, unspecified: Secondary | ICD-10-CM

## 2011-06-09 DIAGNOSIS — Z954 Presence of other heart-valve replacement: Secondary | ICD-10-CM

## 2011-06-09 DIAGNOSIS — Z7901 Long term (current) use of anticoagulants: Secondary | ICD-10-CM

## 2011-06-09 DIAGNOSIS — I6350 Cerebral infarction due to unspecified occlusion or stenosis of unspecified cerebral artery: Secondary | ICD-10-CM

## 2011-07-13 ENCOUNTER — Ambulatory Visit (INDEPENDENT_AMBULATORY_CARE_PROVIDER_SITE_OTHER): Payer: 59 | Admitting: *Deleted

## 2011-07-13 DIAGNOSIS — Z954 Presence of other heart-valve replacement: Secondary | ICD-10-CM

## 2011-07-13 DIAGNOSIS — I359 Nonrheumatic aortic valve disorder, unspecified: Secondary | ICD-10-CM

## 2011-07-13 DIAGNOSIS — Z7901 Long term (current) use of anticoagulants: Secondary | ICD-10-CM

## 2011-07-13 DIAGNOSIS — I6350 Cerebral infarction due to unspecified occlusion or stenosis of unspecified cerebral artery: Secondary | ICD-10-CM

## 2011-07-20 ENCOUNTER — Ambulatory Visit (HOSPITAL_COMMUNITY)
Admission: RE | Admit: 2011-07-20 | Discharge: 2011-07-20 | Disposition: A | Discharge status: Home / Self Care | Payer: 59 | Source: Ambulatory Visit | Attending: Internal Medicine | Admitting: Internal Medicine

## 2011-07-20 DIAGNOSIS — M7989 Other specified soft tissue disorders: Secondary | ICD-10-CM | POA: Insufficient documentation

## 2011-07-20 DIAGNOSIS — M79609 Pain in unspecified limb: Secondary | ICD-10-CM | POA: Insufficient documentation

## 2011-07-20 DIAGNOSIS — R609 Edema, unspecified: Secondary | ICD-10-CM

## 2011-07-20 NOTE — Progress Notes (Signed)
*  VASCULAR LAB PRELIMINARY RESULTS*  Lower Extremity Venous has been performed. Preliminary = Bilateral:  No evidence of DVT, superficial thrombosis, or Baker's Cyst.    Farrel Demark RDMS 07/20/2011, 10:13 AM

## 2011-08-10 ENCOUNTER — Ambulatory Visit (INDEPENDENT_AMBULATORY_CARE_PROVIDER_SITE_OTHER): Payer: 59 | Admitting: Pharmacist

## 2011-08-10 DIAGNOSIS — I359 Nonrheumatic aortic valve disorder, unspecified: Secondary | ICD-10-CM

## 2011-08-10 DIAGNOSIS — Z954 Presence of other heart-valve replacement: Secondary | ICD-10-CM

## 2011-08-10 DIAGNOSIS — Z7901 Long term (current) use of anticoagulants: Secondary | ICD-10-CM

## 2011-08-10 DIAGNOSIS — I6350 Cerebral infarction due to unspecified occlusion or stenosis of unspecified cerebral artery: Secondary | ICD-10-CM

## 2011-08-10 LAB — POCT INR: INR: 2

## 2011-08-10 MED ORDER — WARFARIN SODIUM 3 MG PO TABS: 3.0000 mg | ORAL_TABLET | Freq: Every day | ORAL | Status: DC

## 2011-08-28 ENCOUNTER — Other Ambulatory Visit: Payer: Self-pay | Admitting: Cardiology

## 2011-09-22 ENCOUNTER — Ambulatory Visit (INDEPENDENT_AMBULATORY_CARE_PROVIDER_SITE_OTHER): Payer: 59 | Admitting: Pharmacist

## 2011-09-22 DIAGNOSIS — Z954 Presence of other heart-valve replacement: Secondary | ICD-10-CM

## 2011-09-22 DIAGNOSIS — I6350 Cerebral infarction due to unspecified occlusion or stenosis of unspecified cerebral artery: Secondary | ICD-10-CM

## 2011-09-22 DIAGNOSIS — I359 Nonrheumatic aortic valve disorder, unspecified: Secondary | ICD-10-CM

## 2011-09-22 DIAGNOSIS — Z7901 Long term (current) use of anticoagulants: Secondary | ICD-10-CM

## 2011-09-22 LAB — POCT INR: INR: 2

## 2011-11-03 ENCOUNTER — Ambulatory Visit (INDEPENDENT_AMBULATORY_CARE_PROVIDER_SITE_OTHER): Payer: 59 | Admitting: Pharmacist

## 2011-11-03 DIAGNOSIS — Z7901 Long term (current) use of anticoagulants: Secondary | ICD-10-CM

## 2011-11-03 DIAGNOSIS — I6350 Cerebral infarction due to unspecified occlusion or stenosis of unspecified cerebral artery: Secondary | ICD-10-CM

## 2011-11-03 DIAGNOSIS — Z954 Presence of other heart-valve replacement: Secondary | ICD-10-CM

## 2011-11-03 DIAGNOSIS — I359 Nonrheumatic aortic valve disorder, unspecified: Secondary | ICD-10-CM

## 2011-12-15 ENCOUNTER — Ambulatory Visit (INDEPENDENT_AMBULATORY_CARE_PROVIDER_SITE_OTHER): Payer: 59

## 2011-12-15 DIAGNOSIS — I359 Nonrheumatic aortic valve disorder, unspecified: Secondary | ICD-10-CM

## 2011-12-15 DIAGNOSIS — Z954 Presence of other heart-valve replacement: Secondary | ICD-10-CM

## 2011-12-15 DIAGNOSIS — Z7901 Long term (current) use of anticoagulants: Secondary | ICD-10-CM

## 2011-12-15 DIAGNOSIS — I6350 Cerebral infarction due to unspecified occlusion or stenosis of unspecified cerebral artery: Secondary | ICD-10-CM

## 2011-12-15 LAB — POCT INR: INR: 1.9

## 2012-01-09 ENCOUNTER — Other Ambulatory Visit: Payer: Self-pay | Admitting: Cardiology

## 2012-01-26 ENCOUNTER — Ambulatory Visit (INDEPENDENT_AMBULATORY_CARE_PROVIDER_SITE_OTHER): Payer: 59

## 2012-01-26 DIAGNOSIS — I359 Nonrheumatic aortic valve disorder, unspecified: Secondary | ICD-10-CM

## 2012-01-26 DIAGNOSIS — I6350 Cerebral infarction due to unspecified occlusion or stenosis of unspecified cerebral artery: Secondary | ICD-10-CM

## 2012-01-26 DIAGNOSIS — Z7901 Long term (current) use of anticoagulants: Secondary | ICD-10-CM

## 2012-01-26 DIAGNOSIS — Z954 Presence of other heart-valve replacement: Secondary | ICD-10-CM

## 2012-01-26 LAB — POCT INR: INR: 2.3

## 2012-02-08 ENCOUNTER — Other Ambulatory Visit (HOSPITAL_COMMUNITY)
Admission: RE | Admit: 2012-02-08 | Discharge: 2012-02-08 | Disposition: A | Discharge status: Home / Self Care | Payer: 59 | Source: Ambulatory Visit | Attending: Internal Medicine | Admitting: Internal Medicine

## 2012-02-08 DIAGNOSIS — Z01419 Encounter for gynecological examination (general) (routine) without abnormal findings: Secondary | ICD-10-CM | POA: Insufficient documentation

## 2012-03-08 ENCOUNTER — Ambulatory Visit (INDEPENDENT_AMBULATORY_CARE_PROVIDER_SITE_OTHER): Payer: 59

## 2012-03-08 DIAGNOSIS — I6350 Cerebral infarction due to unspecified occlusion or stenosis of unspecified cerebral artery: Secondary | ICD-10-CM

## 2012-03-08 DIAGNOSIS — Z7901 Long term (current) use of anticoagulants: Secondary | ICD-10-CM

## 2012-03-08 DIAGNOSIS — I359 Nonrheumatic aortic valve disorder, unspecified: Secondary | ICD-10-CM

## 2012-03-08 DIAGNOSIS — Z954 Presence of other heart-valve replacement: Secondary | ICD-10-CM

## 2012-03-28 ENCOUNTER — Other Ambulatory Visit: Payer: Self-pay | Admitting: Internal Medicine

## 2012-03-28 DIAGNOSIS — Z1231 Encounter for screening mammogram for malignant neoplasm of breast: Secondary | ICD-10-CM

## 2012-04-11 ENCOUNTER — Ambulatory Visit (HOSPITAL_COMMUNITY)
Admission: RE | Admit: 2012-04-11 | Discharge: 2012-04-11 | Disposition: A | Discharge status: Home / Self Care | Payer: 59 | Source: Ambulatory Visit | Attending: Internal Medicine | Admitting: Internal Medicine

## 2012-04-11 DIAGNOSIS — M7989 Other specified soft tissue disorders: Secondary | ICD-10-CM | POA: Insufficient documentation

## 2012-04-11 DIAGNOSIS — R609 Edema, unspecified: Secondary | ICD-10-CM

## 2012-04-11 NOTE — Progress Notes (Signed)
*  Preliminary Results* Bilateral lower extremity venous duplex completed. Bilateral lower extremities are negative for deep vein thrombosis. Preliminary results discussed with RN of Dr. Thomasene Lot office.  04/11/2012 10:58 AM Gertie Fey, RDMS, RDCS

## 2012-04-18 ENCOUNTER — Ambulatory Visit (INDEPENDENT_AMBULATORY_CARE_PROVIDER_SITE_OTHER): Payer: 59

## 2012-04-18 DIAGNOSIS — Z954 Presence of other heart-valve replacement: Secondary | ICD-10-CM

## 2012-04-18 DIAGNOSIS — I359 Nonrheumatic aortic valve disorder, unspecified: Secondary | ICD-10-CM

## 2012-04-18 DIAGNOSIS — I6350 Cerebral infarction due to unspecified occlusion or stenosis of unspecified cerebral artery: Secondary | ICD-10-CM

## 2012-04-18 DIAGNOSIS — Z7901 Long term (current) use of anticoagulants: Secondary | ICD-10-CM

## 2012-04-18 LAB — POCT INR: INR: 2.3

## 2012-05-09 ENCOUNTER — Other Ambulatory Visit: Payer: Self-pay | Admitting: Internal Medicine

## 2012-05-09 ENCOUNTER — Ambulatory Visit
Admission: RE | Admit: 2012-05-09 | Discharge: 2012-05-09 | Disposition: A | Discharge status: Home / Self Care | Payer: 59 | Source: Ambulatory Visit | Attending: Internal Medicine | Admitting: Internal Medicine

## 2012-05-09 DIAGNOSIS — Z1231 Encounter for screening mammogram for malignant neoplasm of breast: Secondary | ICD-10-CM

## 2012-05-09 DIAGNOSIS — R319 Hematuria, unspecified: Secondary | ICD-10-CM

## 2012-05-09 DIAGNOSIS — R109 Unspecified abdominal pain: Secondary | ICD-10-CM

## 2012-05-11 ENCOUNTER — Ambulatory Visit
Admission: RE | Admit: 2012-05-11 | Discharge: 2012-05-11 | Disposition: A | Discharge status: Home / Self Care | Payer: 59 | Source: Ambulatory Visit | Attending: Internal Medicine | Admitting: Internal Medicine

## 2012-05-11 DIAGNOSIS — R319 Hematuria, unspecified: Secondary | ICD-10-CM

## 2012-05-11 DIAGNOSIS — R109 Unspecified abdominal pain: Secondary | ICD-10-CM

## 2012-05-25 ENCOUNTER — Ambulatory Visit (INDEPENDENT_AMBULATORY_CARE_PROVIDER_SITE_OTHER): Payer: 59 | Admitting: Surgery

## 2012-05-25 ENCOUNTER — Encounter (INDEPENDENT_AMBULATORY_CARE_PROVIDER_SITE_OTHER): Payer: Self-pay | Admitting: Surgery

## 2012-05-25 VITALS — BP 128/64 | HR 76 | Temp 97.1°F | Resp 20 | Ht 62.0 in | Wt 146.6 lb

## 2012-05-25 DIAGNOSIS — Z7901 Long term (current) use of anticoagulants: Secondary | ICD-10-CM

## 2012-05-25 DIAGNOSIS — K8010 Calculus of gallbladder with chronic cholecystitis without obstruction: Secondary | ICD-10-CM | POA: Insufficient documentation

## 2012-05-25 DIAGNOSIS — Z5320 Procedure and treatment not carried out because of patient's decision for unspecified reasons: Secondary | ICD-10-CM

## 2012-05-25 DIAGNOSIS — K5909 Other constipation: Secondary | ICD-10-CM | POA: Insufficient documentation

## 2012-05-25 NOTE — Progress Notes (Signed)
Subjective:     Patient ID: Elizabeth Morton, female   DOB: 01/09/1956, 56 y.o.   MRN: [id number]  HPI  BRENDER HEWGLEY  12/31/1955 [id number]  Patient Care Team: Enrique Sack, MD as PCP - General Luis Abed, MD as Consulting Physician (Cardiology)  This patient is a 56 y.o.female who presents today for surgical evaluation at the request of Dr. Chilton Si.   Reason for evaluation: Upper abdominal pain and gallstones.  Probable biliary colic.  Pleasant overweight female.  Developed sharp epigastric and right upper quadrant pain.  Radiate to her back.  Became very nauseated and uncomfortable.  Vomited.  Symptoms went down.  Two other episodes that have been milder.  TUMS does not help.  Not consistent with heartburn or reflux.  Thought the first episode may have been due to a "stomach bug".  However with recurrent symptoms, went and saw her primary care physician.  Concern for gallbladder etiology.  Ultrasound showed stones.  Dr. Chilton Si sent the patient to me for evaluation.  No personal nor family history of GI/colon cancer, inflammatory bowel disease, irritable bowel syndrome, allergy such as Celiac Sprue, dietary/dairy problems, colitis, ulcers nor gastritis.  No recent sick contacts/gastroenteritis.  No travel outside the country.  No changes in diet.  She has a bowel movement about twice a week.  She has never had a colonoscopy - does not want one    Patient Active Problem List  Diagnosis  . CAD  . AORTIC STENOSIS  . SHORTNESS OF BREATH  . AORTIC VALVE REPLACEMENT, HX OF  . Warfarin anticoagulation  . Hypertension  . Hx of CABG  . CVA (cerebral infarction)  . Dyslipidemia  . Chronic cholecystitis with calculus  . Constipation, chronic  . Needs screening Colonoscopy - Pt refused    Past Medical History  Diagnosis Date  . Warfarin anticoagulation     coumadin therapy  . Hypertension   . Hx of CABG     2006,LIMA to LAD, SVG to diagonal  . CVA (cerebral infarction)    related to thrombosed aortic root aneurysm  . Dyslipidemia   . CAD (coronary artery disease)   . Heart valve replaced by other means 03/2005    bentall procedure.#21 st.jude mechanical vavle conduit with re- implantation of coronaries. good valve function ..echo 10/09,mechanical valve working well.. echo 10/11.  . S/P aortic valve replacement     Bental procedure,#21 St. Jude mechanical valve conduit with reimplantation of the coronaries 2006 / valve working well, echo, October, 2009 / valve working well echo, October, 2011  . Indigestion 10/11  . SOB (shortness of breath)   . Aortic stenosis     Aortic valve replacement 2006  . Ejection fraction     60%, echo, 2009 / 55-60%, echo, October, 2011  . Ecchymosis     From Coumadin and aspirin    Past Surgical History  Procedure Date  . Coronary artery bypass graft     History   Social History  . Marital Status: Married    Spouse Name: N/A    Number of Children: N/A  . Years of Education: N/A   Occupational History  . Not on file.   Social History Main Topics  . Smoking status: Former Games developer  . Smokeless tobacco: Never Used  . Alcohol Use: No  . Drug Use: No  . Sexually Active: Not on file   Other Topics Concern  . Not on file   Social History Narrative   She  lives in [place] with her husband and son. She previously smoked 37-pack-years,but quit following her stroke in April of 2006. She denies any alcohol or drugs. She has not been routinely exercising.    Family History  Problem Relation Age of Onset  . Stroke Mother   . Alcohol abuse Father   . Hypertension Father   . Stroke Father   . Hyperlipidemia Father   . Stroke Brother     she has three and at least one of them has had a stroke    Current Outpatient Prescriptions  Medication Sig Dispense Refill  . Calcium Carbonate-Vitamin D (CALCIUM 600/VITAMIN D) 600-400 MG-UNIT per chew tablet Chew 1 tablet by mouth 2 (two) times daily.        . furosemide  (LASIX) 20 MG tablet Take 20 mg by mouth daily.      Marland Kitchen omeprazole (PRILOSEC) 20 MG capsule Take 20 mg by mouth daily.        . simvastatin (ZOCOR) 40 MG tablet Take 40 mg by mouth at bedtime.        . triamcinolone (KENALOG) 0.5 % cream Apply topically at bedtime.        . triazolam (HALCION) 0.25 MG tablet Take 0.25 mg by mouth at bedtime as needed.        . warfarin (COUMADIN) 3 MG tablet Take 1 tablet (3 mg total) by mouth daily. Or as directed  30 tablet  3  . [DISCONTINUED] warfarin (COUMADIN) 3 MG tablet TAKE AS DIRECTED  45 tablet  3     No Known Allergies  BP 128/64  Pulse 76  Temp 97.1 F (36.2 C) (Temporal)  Resp 20  Ht 5\' 2"  (1.575 m)  Wt 146 lb 9.6 oz (66.497 kg)  BMI 26.81 kg/m2  US Abdomen Complete  05/11/2012  *RADIOLOGY REPORT*  Clinical Data:  Right flank pain, hematuria, on medication for high cholesterol  COMPLETE ABDOMINAL ULTRASOUND  Comparison:  CT angio abdomen of 11/10/2004  Findings:  Gallbladder:  The gallbladder is visualized and is single non mobile gallstone is present of 1.2 cm with acoustical shadowing. There is no pain over the gallbladder with compression.  Common bile duct:  The common bile duct is normal measuring 2.6 mm in diameter.  Liver:  The liver demonstrates increased echogenicity consistent with fatty infiltration.  The left lobe of liver is somewhat obscured.  IVC:  The IVC is obscured by bowel gas.  Pancreas:  The pancreas also is obscured by bowel gas.  Spleen:  The spleen is normal measuring 5.4 cm sagittally.  Right Kidney:  No hydronephrosis is seen.  The right kidney measures 9.7 cm sagittally.  Left Kidney:  No hydronephrosis is noted.  The left kidney measures 9.2 cm.  A cyst is present in the upper kidney with some calcification of the posterior wall, with this cyst measuring 0.9 x 1.0 x 1.0 cm.  Abdominal aorta:  The abdominal aorta is partially obscured by bowel gas.  Much of the anatomy is obscured by overlying bowel gas.  IMPRESSION:  1.   Single non mobile 1.2 cm gallstone.  No pain over the gallbladder. 2.  Fatty infiltration of the liver. 3.  Bowel gas obscures portions of the liver, pancreas, and abdominal aorta.   Original Report Authenticated By: Juline Patch, M.D.    Mm Digital Screening  05/09/2012  *RADIOLOGY REPORT*  Clinical Data: Screening.  DIGITAL BILATERAL SCREENING MAMMOGRAM WITH CAD  Comparison:  Previous exams.  Findings:  There are scattered fibroglandular densities. No suspicious masses, architectural distortion, or calcifications are present.  Images were processed with CAD.  IMPRESSION: No mammographic evidence of malignancy.  A result letter of this screening mammogram will be mailed directly to the patient.  RECOMMENDATION: Screening mammogram in one year. (Code:SM-B-01Y)  BI-RADS CATEGORY 1:  Negative.   Original Report Authenticated By: Littie Deeds. Judyann Munson, M.D.      Review of Systems  Constitutional: Negative for fever, chills, diaphoresis, appetite change and fatigue.  HENT: Negative for ear pain, sore throat, trouble swallowing, neck pain and ear discharge.   Eyes: Negative for photophobia, discharge and visual disturbance.  Respiratory: Negative for cough, choking, chest tightness and shortness of breath.   Cardiovascular: Negative for chest pain and palpitations.  Gastrointestinal: Positive for constipation. Negative for nausea, vomiting, abdominal pain, diarrhea, blood in stool, abdominal distention, anal bleeding and rectal pain.  Genitourinary: Negative for dysuria, frequency and difficulty urinating.  Musculoskeletal: Negative for myalgias and gait problem.  Skin: Negative for color change, pallor and rash.  Neurological: Negative for dizziness, speech difficulty, weakness and numbness.  Hematological: Negative for adenopathy.  Psychiatric/Behavioral: Negative for confusion and agitation. The patient is not nervous/anxious.        Objective:   Physical Exam  Constitutional: She is oriented to  person, place, and time. She appears well-developed and well-nourished. No distress.  HENT:  Head: Normocephalic.  Mouth/Throat: Oropharynx is clear and moist. No oropharyngeal exudate.  Eyes: Conjunctivae normal and EOM are normal. Pupils are equal, round, and reactive to light. No scleral icterus.  Neck: Normal range of motion. Neck supple. No tracheal deviation present.  Cardiovascular: Normal rate, regular rhythm and intact distal pulses.   Pulmonary/Chest: Effort normal and breath sounds normal. No respiratory distress. She exhibits no tenderness.  Abdominal: Soft. She exhibits no distension, no pulsatile liver, no abdominal bruit and no mass. There is no CVA tenderness, no tenderness at McBurney's point and negative Murphy's sign. No hernia. Hernia confirmed negative in the right inguinal area and confirmed negative in the left inguinal area.    Genitourinary: No vaginal discharge found.  Musculoskeletal: Normal range of motion. She exhibits no tenderness.  Lymphadenopathy:    She has no cervical adenopathy.       Right: No inguinal adenopathy present.       Left: No inguinal adenopathy present.  Neurological: She is alert and oriented to person, place, and time. No cranial nerve deficit. She exhibits normal muscle tone. Coordination normal.  Skin: Skin is warm and dry. No rash noted. She is not diaphoretic. No erythema.  Psychiatric: She has a normal mood and affect. Her behavior is normal. Judgment and thought content normal.       Assessment:     Symptomatic gallstones with probable chronic cholecystitis.    Plan:     Laparoscopic cholecystectomy.  Reasonable to start with single site technique even with large GS:  The anatomy & physiology of hepatobiliary & pancreatic function was discussed.  The pathophysiology of gallbladder dysfunction was discussed.  Natural history risks without surgery was discussed.   I feel the risks of no intervention will lead to serious problems  that outweigh the operative risks; therefore, I recommended cholecystectomy to remove the pathology.  I explained laparoscopic techniques with possible need for an open approach.  Probable cholangiogram to evaluate the bilary tract was explained as well.    Risks such as bleeding, infection, abscess, leak, injury to other organs, need for further treatment,  heart attack, death, and other risks were discussed.  I noted a good likelihood this will help address the problem.  Possibility that this will not correct all abdominal symptoms was explained.  Goals of post-operative recovery were discussed as well.  We will work to minimize complications.  An educational handout further explaining the pathology and treatment options was given as well.  Questions were answered.  The patient expresses understanding & wishes to proceed with surgery.  I am concerned about the health of the patient and the ability to tolerate the operation.   With the patient fully anticoagulated, I need to get a sense of what should be done with her warfarin, etc.  If Lovenox bridge needed, I would like to hold off on restarting Lovenox bridge until a couple days postop.  Therefore, we will request clearance by cardiology to better assess operative risk & see if a reevaluation, further workup, adjustment to medications, etc is needed.  I think she would benefit from being on a fiber bowel regimen to get her bowels more regular.  I stressed the importance of a bowel regimen to have daily soft bowel movements to minimize progression of disease.   Goal of one BM / day ideal.   Educational handouts further explaining bowel regimen were given as well.   I strongly recommend she have a colonoscopy.  She initially did not want to have one.  She seem more open to the idea after discussion.  The patient expressed understanding.

## 2012-05-25 NOTE — Patient Instructions (Addendum)
See the Handout(s) we gave you.  Consider surgery.  Please call our office at [phone] if you wish to schedule surgery or if you have further questions / concerns.   Consider getting a colonoscopy since age over 43 with constipation.  Cholecystitis  Cholecystitis is swelling and irritation (inflammation) of your gallbladder. This often happens when gallstones or sludge build up in the gallbladder. Treatment is needed right away. HOME CARE Home care depends on how you were treated. In general:  If you were given antibiotic medicine, take it as told. Finish the medicine even if you start to feel better.  Only take medicines as told by your doctor.  Eat low-fat foods until your next doctor visit.  Keep all doctor visits as told. GET HELP RIGHT AWAY IF:  You have more pain and medicine does not help.  Your pain moves to a different part of your belly (abdomen) or to your back.  You have a fever.  You feel sick to your stomach (nauseous).  You throw up (vomit). MAKE SURE YOU:  Understand these instructions.  Will watch your condition.  Will get help right away if you are not doing well or get worse. Document Released: 06/18/2011 Document Revised: 09/21/2011 Document Reviewed: 06/18/2011 [facility] Patient Information 2013 [place], [place].  GETTING TO GOOD BOWEL HEALTH. Irregular bowel habits such as constipation and diarrhea can lead to many problems over time.  Having one soft bowel movement a day is the most important way to prevent further problems.  The anorectal canal is designed to handle stretching and feces to safely manage our ability to get rid of solid waste (feces, poop, stool) out of our body.  BUT, hard constipated stools can act like ripping concrete bricks and diarrhea can be a burning fire to this very sensitive area of our body, causing inflamed hemorrhoids, anal fissures, increasing risk is perirectal abscesses, abdominal pain/bloating, an making irritable  bowel worse.     The goal: ONE SOFT BOWEL MOVEMENT A DAY!  To have soft, regular bowel movements:    Drink at least 8 tall glasses of water a day.     Take plenty of fiber.  Fiber is the undigested part of plant food that passes into the colon, acting s "natures broom" to encourage bowel motility and movement.  Fiber can absorb and hold large amounts of water. This results in a larger, bulkier stool, which is soft and easier to pass. Work gradually over several weeks up to 6 servings a day of fiber (25g a day even more if needed) in the form of: o Vegetables -- Root (potatoes, carrots, turnips), leafy green (lettuce, salad greens, celery, spinach), or cooked high residue (cabbage, broccoli, etc) o Fruit -- Fresh (unpeeled skin & pulp), Dried (prunes, apricots, cherries, etc ),  or stewed ( applesauce)  o Whole grain breads, pasta, etc (whole wheat)  o Bran cereals    Bulking Agents -- This type of water-retaining fiber generally is easily obtained each day by one of the following:  o Psyllium bran -- The psyllium plant is remarkable because its ground seeds can retain so much water. This product is available as Metamucil, Konsyl, Effersyllium, Per Diem Fiber, or the less expensive generic preparation in drug and health food stores. Although labeled a laxative, it really is not a laxative.  o Methylcellulose -- This is another fiber derived from wood which also retains water. It is available as Citrucel. o Polyethylene Glycol - and "artificial" fiber commonly called  Miralax or Glycolax.  It is helpful for people with gassy or bloated feelings with regular fiber o Flax Seed - a less gassy fiber than psyllium   No reading or other relaxing activity while on the toilet. If bowel movements take longer than 5 minutes, you are too constipated   AVOID CONSTIPATION.  High fiber and water intake usually takes care of this.  Sometimes a laxative is needed to stimulate more frequent bowel movements, but      Laxatives are not a good long-term solution as it can wear the colon out. o Osmotics (Milk of Magnesia, Fleets phosphosoda, Magnesium citrate, MiraLax, GoLytely) are safer than  o Stimulants (Senokot, Castor Oil, Dulcolax, Ex Lax)    o Do not take laxatives for more than 7days in a row.    IF SEVERELY CONSTIPATED, try a Bowel Retraining Program: o Do not use laxatives.  o Eat a diet high in roughage, such as bran cereals and leafy vegetables.  o Drink six (6) ounces of prune or apricot juice each morning.  o Eat two (2) large servings of stewed fruit each day.  o Take one (1) heaping tablespoon of a psyllium-based bulking agent twice a day. Use sugar-free sweetener when possible to avoid excessive calories.  o Eat a normal breakfast.  o Set aside 15 minutes after breakfast to sit on the toilet, but do not strain to have a bowel movement.  o If you do not have a bowel movement by the third day, use an enema and repeat the above steps.    Controlling diarrhea o Switch to liquids and simpler foods for a few days to avoid stressing your intestines further. o Avoid dairy products (especially milk & ice cream) for a short time.  The intestines often can lose the ability to digest lactose when stressed. o Avoid foods that cause gassiness or bloating.  Typical foods include beans and other legumes, cabbage, broccoli, and dairy foods.  Every person has some sensitivity to other foods, so listen to our body and avoid those foods that trigger problems for you. o Adding fiber (Citrucel, Metamucil, psyllium, Miralax) gradually can help thicken stools by absorbing excess fluid and retrain the intestines to act more normally.  Slowly increase the dose over a few weeks.  Too much fiber too soon can backfire and cause cramping & bloating. o Probiotics (such as active yogurt, Align, etc) may help repopulate the intestines and colon with normal bacteria and calm down a sensitive digestive tract.  Most studies show  it to be of mild help, though, and such products can be costly. o Medicines:   Bismuth subsalicylate (ex. Kayopectate, Pepto Bismol) every 30 minutes for up to 6 doses can help control diarrhea.  Avoid if pregnant.   Loperamide (Immodium) can slow down diarrhea.  Start with two tablets (4mg  total) first and then try one tablet every 6 hours.  Avoid if you are having fevers or severe pain.  If you are not better or start feeling worse, stop all medicines and call your doctor for advice o Call your doctor if you are getting worse or not better.  Sometimes further testing (cultures, endoscopy, X-ray studies, bloodwork, etc) may be needed to help diagnose and treat the cause of the diarrhea. o

## 2012-05-27 ENCOUNTER — Encounter: Payer: Self-pay | Admitting: Cardiology

## 2012-05-27 DIAGNOSIS — I350 Nonrheumatic aortic (valve) stenosis: Secondary | ICD-10-CM | POA: Insufficient documentation

## 2012-05-27 DIAGNOSIS — R58 Hemorrhage, not elsewhere classified: Secondary | ICD-10-CM | POA: Insufficient documentation

## 2012-05-27 DIAGNOSIS — I2510 Atherosclerotic heart disease of native coronary artery without angina pectoris: Secondary | ICD-10-CM | POA: Insufficient documentation

## 2012-05-27 DIAGNOSIS — R0602 Shortness of breath: Secondary | ICD-10-CM | POA: Insufficient documentation

## 2012-05-27 DIAGNOSIS — Z952 Presence of prosthetic heart valve: Secondary | ICD-10-CM | POA: Insufficient documentation

## 2012-05-27 DIAGNOSIS — IMO0002 Reserved for concepts with insufficient information to code with codable children: Secondary | ICD-10-CM | POA: Insufficient documentation

## 2012-05-27 DIAGNOSIS — K30 Functional dyspepsia: Secondary | ICD-10-CM | POA: Insufficient documentation

## 2012-05-27 DIAGNOSIS — R9430 Abnormal result of cardiovascular function study, unspecified: Secondary | ICD-10-CM | POA: Insufficient documentation

## 2012-05-27 NOTE — Progress Notes (Signed)
   I received a computer message from Dr. Michaell Cowing concerning the patient's need for cholecystectomy. I reviewed the record. She had aortic stenosis with a Bentall procedure replacing her aortic root and her aortic valve. Her last echo was 2011. I will arrange for early followup echo and early followup office visit with me to be sure that all of her cardiac issues are stable. I will then coordinate with our Coumadin clinic concerning the specifics of Lovenox bridging.  Jerral Bonito, MD

## 2012-06-02 ENCOUNTER — Ambulatory Visit (INDEPENDENT_AMBULATORY_CARE_PROVIDER_SITE_OTHER): Payer: 59 | Admitting: Cardiology

## 2012-06-02 ENCOUNTER — Other Ambulatory Visit (HOSPITAL_COMMUNITY): Payer: Self-pay | Admitting: Radiology

## 2012-06-02 ENCOUNTER — Ambulatory Visit (HOSPITAL_COMMUNITY): Payer: 59 | Attending: Internal Medicine | Admitting: Radiology

## 2012-06-02 ENCOUNTER — Ambulatory Visit (INDEPENDENT_AMBULATORY_CARE_PROVIDER_SITE_OTHER): Payer: 59

## 2012-06-02 ENCOUNTER — Encounter: Payer: Self-pay | Admitting: Cardiology

## 2012-06-02 VITALS — BP 128/70 | HR 68 | Ht 62.0 in | Wt 145.0 lb

## 2012-06-02 DIAGNOSIS — I3139 Other pericardial effusion (noninflammatory): Secondary | ICD-10-CM | POA: Insufficient documentation

## 2012-06-02 DIAGNOSIS — I2510 Atherosclerotic heart disease of native coronary artery without angina pectoris: Secondary | ICD-10-CM | POA: Insufficient documentation

## 2012-06-02 DIAGNOSIS — I10 Essential (primary) hypertension: Secondary | ICD-10-CM | POA: Insufficient documentation

## 2012-06-02 DIAGNOSIS — Z954 Presence of other heart-valve replacement: Secondary | ICD-10-CM

## 2012-06-02 DIAGNOSIS — Z952 Presence of prosthetic heart valve: Secondary | ICD-10-CM

## 2012-06-02 DIAGNOSIS — I6350 Cerebral infarction due to unspecified occlusion or stenosis of unspecified cerebral artery: Secondary | ICD-10-CM

## 2012-06-02 DIAGNOSIS — I359 Nonrheumatic aortic valve disorder, unspecified: Secondary | ICD-10-CM

## 2012-06-02 DIAGNOSIS — Z8673 Personal history of transient ischemic attack (TIA), and cerebral infarction without residual deficits: Secondary | ICD-10-CM | POA: Insufficient documentation

## 2012-06-02 DIAGNOSIS — R0602 Shortness of breath: Secondary | ICD-10-CM

## 2012-06-02 DIAGNOSIS — I350 Nonrheumatic aortic (valve) stenosis: Secondary | ICD-10-CM

## 2012-06-02 DIAGNOSIS — I313 Pericardial effusion (noninflammatory): Secondary | ICD-10-CM | POA: Insufficient documentation

## 2012-06-02 DIAGNOSIS — Z7901 Long term (current) use of anticoagulants: Secondary | ICD-10-CM

## 2012-06-02 DIAGNOSIS — Z01810 Encounter for preprocedural cardiovascular examination: Secondary | ICD-10-CM | POA: Insufficient documentation

## 2012-06-02 DIAGNOSIS — R0989 Other specified symptoms and signs involving the circulatory and respiratory systems: Secondary | ICD-10-CM

## 2012-06-02 DIAGNOSIS — I639 Cerebral infarction, unspecified: Secondary | ICD-10-CM

## 2012-06-02 DIAGNOSIS — E785 Hyperlipidemia, unspecified: Secondary | ICD-10-CM

## 2012-06-02 DIAGNOSIS — I319 Disease of pericardium, unspecified: Secondary | ICD-10-CM

## 2012-06-02 DIAGNOSIS — I517 Cardiomegaly: Secondary | ICD-10-CM | POA: Insufficient documentation

## 2012-06-02 LAB — BASIC METABOLIC PANEL
BUN: 9 mg/dL (ref 6–23)
CO2: 31 mEq/L (ref 19–32)
Chloride: 99 mEq/L (ref 96–112)
Glucose, Bld: 97 mg/dL (ref 70–99)
Potassium: 3.3 mEq/L — ABNORMAL LOW (ref 3.5–5.1)
Sodium: 137 mEq/L (ref 135–145)

## 2012-06-02 NOTE — Progress Notes (Signed)
Echocardiogram performed.  

## 2012-06-02 NOTE — Assessment & Plan Note (Addendum)
Patient is coumadinized. INR today is actually high. Adjustments will be made. In addition there will be full coordination with the Coumadin clinic and Dr. Michaell Cowing and the patient with exact plan in concerning the stopping of her Coumadin for her cholecystectomy. I have spoken in person with the anticoagulation team. The patient has a mechanical valve in the aortic position. In addition the neurologic event that the patient had in the past was related to any type of clotting abnormality related to her aorta. Therefore I feel that she should be bridged with Lovenox. The anticoagulation team will work with the patient and Dr. Michaell Cowing to decide on the time of surgery and the approach to adjusting her Coumadin and eventually having her on Lovenox just prior to her surgery. Arrangements will then be made to start Lovenox as soon after surgery as Dr. Michaell Cowing will allow.

## 2012-06-02 NOTE — Assessment & Plan Note (Signed)
Lipids are being treated. No change in therapy. 

## 2012-06-02 NOTE — Assessment & Plan Note (Addendum)
The patient is stable from the cardiac viewpoint. She does not need any further workup for coronary disease. She does not need any further workup for her aortic valve replacement. She does need careful attention to her Coumadin therapy. As I have outlined in this note there will be careful coordination with the Coumadin clinic and Dr. Michaell Cowing and the patient concerning exactly have to proceed with her Coumadin dosing now and up to the day of surgery and after surgery. I outlined above in the Coumadin discussion that I have worked out the planning with the anticoagulation team to work with the patient and Dr. Michaell Cowing concerning the timing of surgery and the stopping of Coumadin and the use of Lovenox before surgery and starting it carefully after surgery when Dr. Michaell Cowing feels that it is safe.

## 2012-06-02 NOTE — Progress Notes (Signed)
HPI  The patient is seen today for followup assessment of her aortic valve replacement. Historically the patient received a mechanical aortic valve as part of a Bental procedure with replacement of her ascending aortic root. She is also seen for cardiac clearance for upcoming cholecystectomy. We are working carefully with her Coumadin therapy. She has been able to have her INR checked once every 6 weeks. Her diet has been adjusted with her gallbladder disease. INR today is high in the 6 range. Adjustments will have to be made for this regardless of any other plans. In addition we will coordinate following her INR coming back into therapeutic range and then planning for the discontinuation of her Coumadin based on the timing of her gallbladder surgery. Decision will be made as to whether or not bridging is needed with Lovenox before or after surgery.  The patient is not having any symptoms related to her valve. She's doing well.  No Known Allergies  Current Outpatient Prescriptions  Medication Sig Dispense Refill  . Calcium Carbonate-Vitamin D (CALCIUM 600/VITAMIN D) 600-400 MG-UNIT per chew tablet Chew 1 tablet by mouth 2 (two) times daily.        . furosemide (LASIX) 20 MG tablet Take 20 mg by mouth daily.      Marland Kitchen omeprazole (PRILOSEC) 20 MG capsule Take 20 mg by mouth daily.        . simvastatin (ZOCOR) 40 MG tablet Take 40 mg by mouth at bedtime.        . triamcinolone (KENALOG) 0.5 % cream Apply topically at bedtime.        . triazolam (HALCION) 0.25 MG tablet Take 0.25 mg by mouth at bedtime as needed.        . warfarin (COUMADIN) 3 MG tablet Take 1 tablet (3 mg total) by mouth daily. Or as directed  30 tablet  3    History   Social History  . Marital Status: Married    Spouse Name: N/A    Number of Children: N/A  . Years of Education: N/A   Occupational History  . Not on file.   Social History Main Topics  . Smoking status: Former Games developer  . Smokeless tobacco: Never Used   Comment: quit 2006  . Alcohol Use: No  . Drug Use: No  . Sexually Active: Not on file   Other Topics Concern  . Not on file   Social History Narrative   She lives in [place] with her husband and son. She previously smoked 37-pack-years,but quit following her stroke in April of 2006. She denies any alcohol or drugs. She has not been routinely exercising.    Family History  Problem Relation Age of Onset  . Stroke Mother   . Alcohol abuse Father   . Hypertension Father   . Stroke Father   . Hyperlipidemia Father   . Stroke Brother     she has three and at least one of them has had a stroke    Past Medical History  Diagnosis Date  . Warfarin anticoagulation     coumadin therapy  . Hypertension   . Hx of CABG     2006,LIMA to LAD, SVG to diagonal  . 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 Dyslipidemia   . CAD (coronary artery disease)   . Heart valve replaced by other means 03/2005    bentall procedure.#21 st.jude mechanical vavle conduit with re- implantation of coronaries. good valve function .Marland Kitchen  echo 10/09,mechanical valve working well.. echo 10/11.  . S/P aortic valve replacement     Bental procedure,#21 St. Jude mechanical valve conduit with reimplantation of the coronaries 2006 / valve working well, echo, October, 2009 / valve working well echo, October, 2011  . Indigestion 10/11  . SOB (shortness of breath)   . Aortic stenosis     Aortic valve replacement 2006  . Ejection fraction     60%, echo, 2009 / 55-60%, echo, October, 2011  . Ecchymosis     From Coumadin and aspirin  . Preop cardiovascular exam     Cardiac clearance for gallbladder surgery November, 2013    Past Surgical History  Procedure Date  . Coronary artery bypass graft     Patient Active Problem List  Diagnosis  . SHORTNESS OF BREATH  . Warfarin anticoagulation  . Hypertension  . Hx of CABG  . CVA (cerebral infarction)  . Dyslipidemia  . Chronic  cholecystitis with calculus  . Constipation, chronic  . Needs screening Colonoscopy - Pt refused  . CAD (coronary artery disease)  . S/P aortic valve replacement  . Indigestion  . SOB (shortness of breath)  . Aortic stenosis  . Ejection fraction  . Ecchymosis  . Long term (current) use of anticoagulants  . Preop cardiovascular exam    ROS   Patient denies fever, chills, headache, sweats, rash, change in vision, change in hearing, chest pain, cough, nausea vomiting, urinary symptoms. All of the systems are reviewed and are negative.  PHYSICAL EXAM  Patient is oriented to person time and place. Affect is normal. There is no jugulovenous distention. Lungs are clear. Respiratory effort is nonlabored. Cardiac exam reveals S1 and S2. The closure sound of her aortic prosthesis is crisp. No AI is heard. The abdomen is soft. There is no peripheral edema. There are no musculoskeletal deformities. There are no skin rashes.  Filed Vitals:   06/02/12 1028  BP: 128/70  Pulse: 68  Height: 5\' 2"  (1.575 m)  Weight: 145 lb (65.772 kg)   EKG is done today and reviewed by me. There is sinus rhythm. There are diffuse nonspecific ST-T wave changes. This is unchanged from the tracing of May, 2012.  ASSESSMENT & PLAN

## 2012-06-02 NOTE — Assessment & Plan Note (Signed)
There is an insignificant pericardial effusion on her echo today. No further workup is needed.

## 2012-06-02 NOTE — Assessment & Plan Note (Signed)
The patient is not having any significant shortness of breath. No change in therapy.

## 2012-06-02 NOTE — Patient Instructions (Addendum)
Your physician recommends that you return for lab work in: today (PT/INR)

## 2012-06-02 NOTE — Assessment & Plan Note (Signed)
There is a history of some coronary disease. This is stable. She does not need any ischemic screening at this time. She is fully active. She has good exercise tolerance.

## 2012-06-02 NOTE — Assessment & Plan Note (Signed)
I have personally reviewed the echocardiogram done today. The patient's aortic valve prosthesis is working very well. She has normal ejection fraction of 60%. No further workup of her valvular disease as needed. Careful attention to her anticoagulation is of course appropriate.

## 2012-06-02 NOTE — Assessment & Plan Note (Signed)
Blood pressure is controlled. No change in therapy. 

## 2012-06-08 ENCOUNTER — Other Ambulatory Visit: Payer: Self-pay

## 2012-06-08 MED ORDER — POTASSIUM CHLORIDE ER 10 MEQ PO TBCR: 10.0000 meq | EXTENDED_RELEASE_TABLET | Freq: Every day | ORAL | Status: DC

## 2012-06-10 ENCOUNTER — Ambulatory Visit (INDEPENDENT_AMBULATORY_CARE_PROVIDER_SITE_OTHER): Payer: 59

## 2012-06-10 DIAGNOSIS — I6350 Cerebral infarction due to unspecified occlusion or stenosis of unspecified cerebral artery: Secondary | ICD-10-CM

## 2012-06-10 DIAGNOSIS — Z7901 Long term (current) use of anticoagulants: Secondary | ICD-10-CM

## 2012-06-10 DIAGNOSIS — Z954 Presence of other heart-valve replacement: Secondary | ICD-10-CM

## 2012-06-10 DIAGNOSIS — Z952 Presence of prosthetic heart valve: Secondary | ICD-10-CM

## 2012-06-10 DIAGNOSIS — I639 Cerebral infarction, unspecified: Secondary | ICD-10-CM

## 2012-06-13 ENCOUNTER — Telehealth (INDEPENDENT_AMBULATORY_CARE_PROVIDER_SITE_OTHER): Payer: Self-pay | Admitting: General Surgery

## 2012-06-13 NOTE — Telephone Encounter (Signed)
Cardiac clearance in EPIC. Orders taken to scheduling.

## 2012-06-13 NOTE — Telephone Encounter (Signed)
Message copied by Liliana Cline on Mon Jun 13, 2012 10:22 AM ------      Message from: Zacarias Pontes      Created: Mon Jun 13, 2012 10:06 AM       Has sx clrx come in for pt.She is ready to sched before the year is out...[phone]

## 2012-06-22 ENCOUNTER — Ambulatory Visit (HOSPITAL_COMMUNITY): Payer: Self-pay | Admitting: Dentistry

## 2012-06-22 ENCOUNTER — Encounter (HOSPITAL_COMMUNITY): Payer: Self-pay | Admitting: Dentistry

## 2012-06-22 VITALS — BP 137/69 | HR 78 | Temp 98.1°F

## 2012-06-22 DIAGNOSIS — Z972 Presence of dental prosthetic device (complete) (partial): Secondary | ICD-10-CM

## 2012-06-22 DIAGNOSIS — Z463 Encounter for fitting and adjustment of dental prosthetic device: Secondary | ICD-10-CM

## 2012-06-22 DIAGNOSIS — K0820 Unspecified atrophy of edentulous alveolar ridge: Secondary | ICD-10-CM

## 2012-06-22 DIAGNOSIS — K08109 Complete loss of teeth, unspecified cause, unspecified class: Secondary | ICD-10-CM

## 2012-06-22 NOTE — Patient Instructions (Signed)
Patient is to think about her options at this time and contact dental medicine if she wishes to proceed with upper lower complete denture relines. Patient was provided a quote on the cost of the procedures. Patient will also call if she needs assistance in referral to another dentist for fabrication of new upper lower complete dentures. Patient to call as needed for denture adjustment and scheduling of denture recall in 6 months to 1 year.  Dr. Kristin Bruins

## 2012-06-22 NOTE — Progress Notes (Signed)
06/22/2012  Patient:            Elizabeth Morton Date of Birth:  Dec 29, 1955 MRN:                [id number]  BP 137/69  Pulse 78  Temp 98.1 F (36.7 C) (Oral)  SCHAE CANDO is a 56 year old female that presents for periodic oral exam and evaluation of upper lower complete dentures. Patient was initially seen for dental consultation in August of 2006 as part of a pre-aortic valve replacement dental protocol. Patient had all remaining teeth extracted with alveoloplasty and pre-prosthetic surgery on 03/23/2005 in the operating room. Dentures were fabricated and inserted on 07/01/2005. Patient was seen for multiple post-insertion adjustments and then was lost to follow up in August of 2007. Patient now presents for evaluation of upper lower complete dentures and periodic oral examination.  Premedication: None required today. Medical Hx Update:  Past Medical History  Diagnosis Date  . Warfarin anticoagulation     coumadin therapy  . Hypertension   . Hx of CABG     2006,LIMA to LAD, SVG to diagonal  . 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 Dyslipidemia   . CAD (coronary artery disease)   . Heart valve replaced by other means 03/2005    bentall procedure.#21 st.jude mechanical vavle conduit with re- implantation of coronaries. good valve function ..echo 10/09,mechanical valve working well.. echo 10/11.  . S/P aortic valve replacement     Bental procedure,#21 St. Jude mechanical valve conduit with reimplantation of the coronaries 2006 / valve working well, echo, October, 2009 / valve working well echo, October, 2011  . Indigestion 10/11  . SOB (shortness of breath)   . Aortic stenosis     Aortic valve replacement 2006  . Ejection fraction     60%, echo, 2009 / 55-60%, echo, October, 2011  . Ecchymosis     From Coumadin and aspirin  . Preop cardiovascular exam     Cardiac clearance for gallbladder surgery November, 2013  . Pericardial effusion      Insignificant small pericardial effusion seen on echo, November, 2013  . ALLERGIES/ADVERSE DRUG REACTIONS: No Known Allergies MEDICATIONS: Current Outpatient Prescriptions  Medication Sig Dispense Refill  . Calcium Carbonate-Vitamin D (CALCIUM 600/VITAMIN D) 600-400 MG-UNIT per chew tablet Chew 1 tablet by mouth 2 (two) times daily.        . furosemide (LASIX) 20 MG tablet Take 20 mg by mouth daily.      Marland Kitchen omeprazole (PRILOSEC) 20 MG capsule Take 20 mg by mouth daily.        . potassium chloride (K-DUR) 10 MEQ tablet Take 1 tablet (10 mEq total) by mouth daily.  30 tablet  6  . simvastatin (ZOCOR) 40 MG tablet Take 40 mg by mouth at bedtime.        . triamcinolone (KENALOG) 0.5 % cream Apply topically at bedtime.        . triazolam (HALCION) 0.25 MG tablet Take 0.25 mg by mouth at bedtime as needed.        . warfarin (COUMADIN) 3 MG tablet Take 1 tablet (3 mg total) by mouth daily. Or as directed  30 tablet  3    C/C: Patient is complaining of" loose lower denture" . HPI:  Elizabeth Morton is a 56 year old female that presents for periodic oral exam and evaluation of upper lower complete dentures. Patient was initially  seen for dental consultation in August of 2006 as part of a pre-aortic valve replacement dental protocol. Patient had all remaining teeth extracted with alveoloplasty and pre-prosthetic surgery on 03/23/2005 in the operating room. Dentures were fabricated and inserted on 07/01/2005. Patient was seen for multiple post-insertion adjustments and then was lost to follow up in August of 2007. Patient now presents for evaluation of upper lower complete dentures and periodic oral examination.  The patient wears the upper denture only. Patient indicates that she does not have any denture irritation from the upper denture. The patient indicates that she does not wear the lower denture at this time. However, the patient is interested in a possible reline procedure.   DENTAL  EXAM: General:  Patient is a well-developed, well-nourished female in no acute distress. Vitals: BP 137/69  Pulse 78  Temp 98.1 F (36.7 C) (Oral)  Extraoral Exam:  There is no palpable lymphadenopathy.   The patient denies acute TMJ Symptoms. Intraoral  Exam:  The patient has Normal Saliva. There is no evidence of denture irritation or erythema.  There is significant atrophy of the edentulous alveolar ridges. Dentition: The patient is edentulous. Prosthodontic:The patient has an upper lower complete denture with acceptable stability but less than ideal retention. Pressure indicating paste was applied to the dentures and adjustments were made as needed. Dentures were polished. Occlusion: The patient has a class II denture occlusion. Occlusal adjustments were made in centric relation and protrusive strokes. Patient was very difficult secondary to previous cerebrovascular accident and facial involvement with the arc of closure.   Assessments: 1. The patient is edentulous. 2.  There is atrophy of the edentulous alveolar ridges. 3.  The patient has upper lower complete dentures. The dentures are stable but have less than ideal retention.  Plan:  1. I discussed the risks, benefits, complications of various treatment options for the patient in relationship to her current medical and dental conditions.  We discussed various treatment options to include no treatment, fabrication of a new upper and lower complete denture by a local prosthodontist, specialist at the school of dentistry in [place], [place], or fabrication of a new set of dentures by [employer].  We also discussed possible reline procedures as well as referral for implant therapy.  The patient currently wishes to think about her options and will contact dental medicine when she is ready to make a decision.  The patient will also call if denture problems arise in the interim.  The patient did not want to make an appointment  for denture recall and periodic oral examination in 6 months to one year at this time.   Charlynne Pander, DDS

## 2012-07-01 ENCOUNTER — Ambulatory Visit (INDEPENDENT_AMBULATORY_CARE_PROVIDER_SITE_OTHER): Payer: 59 | Admitting: *Deleted

## 2012-07-01 DIAGNOSIS — I6350 Cerebral infarction due to unspecified occlusion or stenosis of unspecified cerebral artery: Secondary | ICD-10-CM

## 2012-07-01 DIAGNOSIS — I639 Cerebral infarction, unspecified: Secondary | ICD-10-CM

## 2012-07-01 DIAGNOSIS — Z952 Presence of prosthetic heart valve: Secondary | ICD-10-CM

## 2012-07-01 DIAGNOSIS — Z7901 Long term (current) use of anticoagulants: Secondary | ICD-10-CM

## 2012-07-01 DIAGNOSIS — Z954 Presence of other heart-valve replacement: Secondary | ICD-10-CM

## 2012-07-01 DIAGNOSIS — I359 Nonrheumatic aortic valve disorder, unspecified: Secondary | ICD-10-CM

## 2012-07-01 MED ORDER — ENOXAPARIN SODIUM 60 MG/0.6ML ~~LOC~~ SOLN: 60.0000 mg | Freq: Two times a day (BID) | SUBCUTANEOUS | Status: DC

## 2012-07-01 MED ORDER — WARFARIN SODIUM 3 MG PO TABS: 3.0000 mg | ORAL_TABLET | Freq: Every day | ORAL | Status: DC

## 2012-07-01 NOTE — Patient Instructions (Addendum)
Surgery on 07/21/12 per Dr Acquanetta Belling  07/15/2012 last day to take your coumadin 07/16/2012 no coumadin or lovenox 07/17/2012 lovenox 60 mg subcutaneous 8 am and 8pm 07/18/2012 lovenox 60 mg subcutaneous 8 am and 8pm 07/19/2012 lovenox 60 mg subcutaneous 8 am and 8pm 07/20/2012 lovenox 60 mg subcutaneous 8 am only (no pm dose) 07/21/2012 day of surgery no lovenox or coumadin  When surgeon says its Ok to start your coumadin back take an extra 1/2 tablet for 2 days (booster doses), also restart your lovenox injections. At 60 mg 2 times/day. F/u in coumadin clinic and when your INR is above 2 we can stop the lovenox.

## 2012-07-14 NOTE — Pre-Procedure Instructions (Signed)
20 Elizabeth Morton  07/14/2012   Your procedure is scheduled on:  January 9  Report to [facility] at 05:30 AM.  Call this number if you have problems the morning of surgery: [phone]   Remember:   Do not eat or drink:After Midnight.  Take these medicines the morning of surgery with A SIP OF WATER: Lovenox as directed, Omeprazole,    Do not wear jewelry, make-up or nail polish.  Do not wear lotions, powders, or perfumes. You may wear deodorant.  Do not shave 48 hours prior to surgery. Men may shave face and neck.  Do not bring valuables to the hospital.  Contacts, dentures or bridgework may not be worn into surgery.  Leave suitcase in the car. After surgery it may be brought to your room.  For patients admitted to the hospital, checkout time is 11:00 AM the day of discharge.   Patients discharged the day of surgery will not be allowed to drive home.  Name and phone number of your driver: Family/ Friend  Special Instructions: Shower using CHG 2 nights before surgery and the night before surgery.  If you shower the day of surgery use CHG.  Use special wash - you have one bottle of CHG for all showers.  You should use approximately 1/3 of the bottle for each shower.   Please read over the following fact sheets that you were given: Pain Booklet, Coughing and Deep Breathing and Surgical Site Infection Prevention

## 2012-07-15 ENCOUNTER — Encounter (HOSPITAL_COMMUNITY)
Admission: RE | Admit: 2012-07-15 | Discharge: 2012-07-15 | Disposition: A | Discharge status: Home / Self Care | Payer: 59 | Source: Ambulatory Visit | Attending: Surgery | Admitting: Surgery

## 2012-07-15 ENCOUNTER — Ambulatory Visit (HOSPITAL_COMMUNITY)
Admission: RE | Admit: 2012-07-15 | Discharge: 2012-07-15 | Disposition: A | Discharge status: Home / Self Care | Payer: 59 | Source: Ambulatory Visit | Attending: Anesthesiology | Admitting: Anesthesiology

## 2012-07-15 ENCOUNTER — Encounter (HOSPITAL_COMMUNITY): Payer: Self-pay

## 2012-07-15 DIAGNOSIS — Z01818 Encounter for other preprocedural examination: Secondary | ICD-10-CM | POA: Insufficient documentation

## 2012-07-15 DIAGNOSIS — Z01812 Encounter for preprocedural laboratory examination: Secondary | ICD-10-CM | POA: Insufficient documentation

## 2012-07-15 HISTORY — DX: Peripheral vascular disease, unspecified: I73.9

## 2012-07-15 HISTORY — DX: Cardiac murmur, unspecified: R01.1

## 2012-07-15 HISTORY — DX: Gastro-esophageal reflux disease without esophagitis: K21.9

## 2012-07-15 HISTORY — DX: Cerebral infarction, unspecified: I63.9

## 2012-07-15 LAB — APTT: aPTT: 40 seconds — ABNORMAL HIGH (ref 24–37)

## 2012-07-15 LAB — BASIC METABOLIC PANEL
CO2: 28 mEq/L (ref 19–32)
Chloride: 99 mEq/L (ref 96–112)
GFR calc Af Amer: 69 mL/min — ABNORMAL LOW (ref >90)
Potassium: 4 mEq/L (ref 3.5–5.1)

## 2012-07-15 LAB — SURGICAL PCR SCREEN
MRSA, PCR: NEGATIVE
Staphylococcus aureus: NEGATIVE

## 2012-07-15 LAB — CBC
HCT: 41.4 % (ref 36.0–46.0)
Hemoglobin: 14 g/dL (ref 12.0–15.0)
WBC: 6 10*3/uL (ref 4.0–10.5)

## 2012-07-15 LAB — PROTIME-INR: INR: 1.99 — ABNORMAL HIGH (ref 0.00–1.49)

## 2012-07-15 NOTE — Progress Notes (Signed)
Clearance note from dr Myrtis Ser, echo 11/13 ,ekg,11/13 in epic

## 2012-07-15 NOTE — Pre-Procedure Instructions (Addendum)
20 Elizabeth Morton  07/15/2012   Your procedure is scheduled on:  07/21/12  Report to [facility] at 530 AM.  Call this number if you have problems the morning of surgery: [phone]   Remember:   Do not eat food or drink:After Midnight.    Take these medicines the morning of surgery with A SIP OF WATER: prilosec  STOP coumadin per dr   Drucilla Schmidt not wear jewelry, make-up or nail polish.  Do not wear lotions, powders, or perfumes. You may not wear deodorant.  Do not shave 48 hours prior to surgery. Men may shave face and neck.  Do not bring valuables to the hospital.  Contacts, dentures or bridgework may not be worn into surgery.  Leave suitcase in the car. After surgery it may be brought to your room.  For patients admitted to the hospital, checkout time is 11:00 AM the day of discharge.   Patients discharged the day of surgery will not be allowed to drive home.  Name and phone number of your driver:spouse  Windy Fast [phone]  Special Instructions: Shower using CHG 2 nights before surgery and the night before surgery.  If you shower the day of surgery use CHG.  Use special wash - you have one bottle of CHG for all showers.  You should use approximately 1/3 of the bottle for each shower.   Please read over the following fact sheets that you were given: Pain Booklet, Coughing and Deep Breathing and MRSA Information

## 2012-07-18 NOTE — Consult Note (Signed)
Anesthesia chart review: Patient is a 57 year old female scheduled for laparoscopic cholecystectomy by Dr. Michaell Cowing on 07/22/2011. History includes former smoker, hypertension, CAD/severe AS status post CABG (LIMA to LAD, SVG to DIAG)/AVR (St. Jude mechanical)/Bentall procedure 03/25/05, dyslipidemia, CVA '06, PVD, GERD.  Cardiologist is Dr. Myrtis Ser, last visit on 06/02/12 for follow up and preoperative evaluation.  Per his note, "The patient is stable from the cardiac viewpoint. She does not need any further workup for coronary disease. She does not need any further workup for her aortic valve replacement. She does need careful attention to her Coumadin therapy. As I have outlined in this note there will be careful coordination with the Coumadin clinic and Dr. Michaell Cowing and the patient concerning exactly have to proceed with her Coumadin dosing now and up to the day of surgery and after surgery. I outlined above in the Coumadin discussion that I have worked out the planning with the anticoagulation team to work with the patient and Dr. Michaell Cowing concerning the timing of surgery and the stopping of Coumadin and the use of Lovenox before surgery and starting it carefully after surgery when Dr. Michaell Cowing feels that it is safe."  EKG on 06/02/12 showed NSR, ST/T wave abnormality, consider inferior and anterolateral ischemia.  Dr. Myrtis Ser felt it was unchanged from prior EKG.  Echo done on 06/02/12 showed: - Left ventricle: The cavity size was normal. Wall thickness was normal. Systolic function was normal. The estimated ejection fraction was in the range of 55% to 60%. - Aortic valve: AVR not well seen but normal gradients and no AR. - Left atrium: The atrium was mildly dilated. - Atrial septum: No defect or patent foramen ovale was identified. - Pericardium, extracardiac: A trivial pericardial effusion was identified posterior to the heart.  Her last cardiac cath was on 03/24/05 prior to her CABG/Bentall procedure.  CXR on  07/15/12 showed no active disease.  Preoperative labs noted.  Repeat PT/PTT on the day of surgery.  If follow up labs are reasonable and no significant change in her status then anticipate she can proceed as planned.  Shonna Chock, [facility] 07/18/12 1304

## 2012-07-20 MED ORDER — CHLORHEXIDINE GLUCONATE 4 % EX LIQD: 1.0000 "application " | Freq: Once | CUTANEOUS | Status: DC

## 2012-07-20 MED ORDER — DEXTROSE 5 % IV SOLN
3.0000 g | INTRAVENOUS | Status: AC
  Administered 2012-07-21: 3 g via INTRAVENOUS
  Filled 2012-07-20: qty 3000

## 2012-07-20 MED ORDER — GENTAMICIN IN SALINE 1-0.9 MG/ML-% IV SOLN
100.0000 mg | INTRAVENOUS | Status: AC
  Administered 2012-07-21: 100 mg via INTRAVENOUS
  Filled 2012-07-20: qty 100

## 2012-07-21 ENCOUNTER — Encounter (HOSPITAL_COMMUNITY): Payer: Self-pay | Admitting: *Deleted

## 2012-07-21 ENCOUNTER — Encounter (HOSPITAL_COMMUNITY): Payer: Self-pay | Admitting: Vascular Surgery

## 2012-07-21 ENCOUNTER — Encounter (HOSPITAL_COMMUNITY)
Admission: RE | Disposition: A | Discharge status: Home / Self Care | Payer: Self-pay | Source: Ambulatory Visit | Attending: Surgery

## 2012-07-21 ENCOUNTER — Ambulatory Visit (HOSPITAL_COMMUNITY): Payer: 59

## 2012-07-21 ENCOUNTER — Ambulatory Visit (HOSPITAL_COMMUNITY): Payer: 59 | Admitting: Vascular Surgery

## 2012-07-21 ENCOUNTER — Ambulatory Visit (HOSPITAL_COMMUNITY)
Admission: RE | Admit: 2012-07-21 | Discharge: 2012-07-21 | Disposition: A | Discharge status: Home / Self Care | Payer: 59 | Source: Ambulatory Visit | Attending: Surgery | Admitting: Surgery

## 2012-07-21 DIAGNOSIS — Z8249 Family history of ischemic heart disease and other diseases of the circulatory system: Secondary | ICD-10-CM | POA: Insufficient documentation

## 2012-07-21 DIAGNOSIS — Z87891 Personal history of nicotine dependence: Secondary | ICD-10-CM | POA: Insufficient documentation

## 2012-07-21 DIAGNOSIS — K811 Chronic cholecystitis: Secondary | ICD-10-CM

## 2012-07-21 DIAGNOSIS — I2510 Atherosclerotic heart disease of native coronary artery without angina pectoris: Secondary | ICD-10-CM | POA: Insufficient documentation

## 2012-07-21 DIAGNOSIS — Z951 Presence of aortocoronary bypass graft: Secondary | ICD-10-CM | POA: Insufficient documentation

## 2012-07-21 DIAGNOSIS — K219 Gastro-esophageal reflux disease without esophagitis: Secondary | ICD-10-CM | POA: Insufficient documentation

## 2012-07-21 DIAGNOSIS — I10 Essential (primary) hypertension: Secondary | ICD-10-CM | POA: Insufficient documentation

## 2012-07-21 DIAGNOSIS — I739 Peripheral vascular disease, unspecified: Secondary | ICD-10-CM | POA: Insufficient documentation

## 2012-07-21 DIAGNOSIS — E785 Hyperlipidemia, unspecified: Secondary | ICD-10-CM | POA: Insufficient documentation

## 2012-07-21 DIAGNOSIS — Z823 Family history of stroke: Secondary | ICD-10-CM | POA: Insufficient documentation

## 2012-07-21 DIAGNOSIS — Z7901 Long term (current) use of anticoagulants: Secondary | ICD-10-CM | POA: Insufficient documentation

## 2012-07-21 DIAGNOSIS — Z8673 Personal history of transient ischemic attack (TIA), and cerebral infarction without residual deficits: Secondary | ICD-10-CM | POA: Insufficient documentation

## 2012-07-21 HISTORY — PX: LAPAROSCOPIC CHOLECYSTECTOMY SINGLE PORT: SHX5891

## 2012-07-21 LAB — PROTIME-INR
INR: 1.02 (ref 0.00–1.49)
Prothrombin Time: 13.3 seconds (ref 11.6–15.2)

## 2012-07-21 SURGERY — LAPAROSCOPIC CHOLECYSTECTOMY SINGLE SITE
Anesthesia: General | Site: Abdomen | Wound class: Clean Contaminated

## 2012-07-21 MED ORDER — HYDROMORPHONE HCL PF 1 MG/ML IJ SOLN: 0.2500 mg | INTRAMUSCULAR | Status: DC | PRN

## 2012-07-21 MED ORDER — GLYCOPYRROLATE 0.2 MG/ML IJ SOLN
INTRAMUSCULAR | Status: DC | PRN
  Administered 2012-07-21: .8 mg via INTRAVENOUS

## 2012-07-21 MED ORDER — DEXAMETHASONE SODIUM PHOSPHATE 4 MG/ML IJ SOLN
INTRAMUSCULAR | Status: DC | PRN
  Administered 2012-07-21: 8 mg via INTRAVENOUS

## 2012-07-21 MED ORDER — SODIUM CHLORIDE 0.9 % IJ SOLN: 3.0000 mL | INTRAMUSCULAR | Status: DC | PRN

## 2012-07-21 MED ORDER — METOCLOPRAMIDE HCL 5 MG/ML IJ SOLN: 10.0000 mg | Freq: Once | INTRAMUSCULAR | Status: DC | PRN

## 2012-07-21 MED ORDER — NEOSTIGMINE METHYLSULFATE 1 MG/ML IJ SOLN
INTRAMUSCULAR | Status: DC | PRN
  Administered 2012-07-21: 5 mg via INTRAVENOUS

## 2012-07-21 MED ORDER — SODIUM CHLORIDE 0.9 % IR SOLN
Status: DC | PRN
Start: 2012-07-21 — End: 2012-07-21
  Administered 2012-07-21: 1000 mL

## 2012-07-21 MED ORDER — LACTATED RINGERS IV SOLN
INTRAVENOUS | Status: DC | PRN
  Administered 2012-07-21: 07:00:00 via INTRAVENOUS

## 2012-07-21 MED ORDER — SODIUM CHLORIDE 0.9 % IJ SOLN: 3.0000 mL | Freq: Two times a day (BID) | INTRAMUSCULAR | Status: DC

## 2012-07-21 MED ORDER — OXYCODONE HCL 5 MG PO TABS: 5.0000 mg | ORAL_TABLET | Freq: Four times a day (QID) | ORAL | Status: DC | PRN

## 2012-07-21 MED ORDER — OXYCODONE HCL 5 MG PO TABS
5.0000 mg | ORAL_TABLET | Freq: Once | ORAL | Status: AC | PRN
  Administered 2012-07-21: 5 mg via ORAL

## 2012-07-21 MED ORDER — OXYCODONE HCL 5 MG PO TABS
ORAL_TABLET | ORAL | Status: AC
  Filled 2012-07-21: qty 1

## 2012-07-21 MED ORDER — BUPIVACAINE-EPINEPHRINE 0.25% -1:200000 IJ SOLN
INTRAMUSCULAR | Status: AC
  Filled 2012-07-21: qty 1

## 2012-07-21 MED ORDER — MIDAZOLAM HCL 5 MG/5ML IJ SOLN
INTRAMUSCULAR | Status: DC | PRN
  Administered 2012-07-21: 2 mg via INTRAVENOUS

## 2012-07-21 MED ORDER — ONDANSETRON HCL 4 MG/2ML IJ SOLN
INTRAMUSCULAR | Status: DC | PRN
  Administered 2012-07-21: 4 mg via INTRAVENOUS

## 2012-07-21 MED ORDER — ACETAMINOPHEN 650 MG RE SUPP: 650.0000 mg | RECTAL | Status: DC | PRN

## 2012-07-21 MED ORDER — ONDANSETRON HCL 4 MG/2ML IJ SOLN: 4.0000 mg | Freq: Four times a day (QID) | INTRAMUSCULAR | Status: DC | PRN

## 2012-07-21 MED ORDER — OXYCODONE HCL 5 MG PO TABS: 5.0000 mg | ORAL_TABLET | ORAL | Status: DC | PRN

## 2012-07-21 MED ORDER — EPHEDRINE SULFATE 50 MG/ML IJ SOLN
INTRAMUSCULAR | Status: DC | PRN
  Administered 2012-07-21: 5 mg via INTRAVENOUS
  Administered 2012-07-21: 10 mg via INTRAVENOUS

## 2012-07-21 MED ORDER — 0.9 % SODIUM CHLORIDE (POUR BTL) OPTIME
TOPICAL | Status: DC | PRN
  Administered 2012-07-21: 1000 mL

## 2012-07-21 MED ORDER — SODIUM CHLORIDE 0.9 % IV SOLN: 250.0000 mL | INTRAVENOUS | Status: DC | PRN

## 2012-07-21 MED ORDER — KETOROLAC TROMETHAMINE 30 MG/ML IJ SOLN
INTRAMUSCULAR | Status: DC | PRN
  Administered 2012-07-21: 30 mg via INTRAVENOUS

## 2012-07-21 MED ORDER — ACETAMINOPHEN 325 MG PO TABS: 650.0000 mg | ORAL_TABLET | ORAL | Status: DC | PRN

## 2012-07-21 MED ORDER — SODIUM CHLORIDE 0.9 % IV SOLN
INTRAVENOUS | Status: DC | PRN
  Administered 2012-07-21: 08:00:00

## 2012-07-21 MED ORDER — OXYCODONE HCL 5 MG/5ML PO SOLN: 5.0000 mg | Freq: Once | ORAL | Status: AC | PRN

## 2012-07-21 MED ORDER — FENTANYL CITRATE 0.05 MG/ML IJ SOLN: 25.0000 ug | INTRAMUSCULAR | Status: DC | PRN

## 2012-07-21 MED ORDER — ROCURONIUM BROMIDE 100 MG/10ML IV SOLN
INTRAVENOUS | Status: DC | PRN
  Administered 2012-07-21: 35 mg via INTRAVENOUS
  Administered 2012-07-21: 5 mg via INTRAVENOUS

## 2012-07-21 MED ORDER — LIDOCAINE HCL (CARDIAC) 20 MG/ML IV SOLN
INTRAVENOUS | Status: DC | PRN
  Administered 2012-07-21: 60 mg via INTRAVENOUS

## 2012-07-21 MED ORDER — FENTANYL CITRATE 0.05 MG/ML IJ SOLN
INTRAMUSCULAR | Status: DC | PRN
  Administered 2012-07-21: 50 ug via INTRAVENOUS
  Administered 2012-07-21 (×2): 100 ug via INTRAVENOUS

## 2012-07-21 MED ORDER — BUPIVACAINE-EPINEPHRINE 0.25% -1:200000 IJ SOLN
INTRAMUSCULAR | Status: DC | PRN
  Administered 2012-07-21: 50 mL

## 2012-07-21 MED ORDER — PROPOFOL 10 MG/ML IV BOLUS
INTRAVENOUS | Status: DC | PRN
  Administered 2012-07-21: 200 mg via INTRAVENOUS

## 2012-07-21 SURGICAL SUPPLY — 52 items
APPLIER CLIP 5 13 M/L LIGAMAX5 (MISCELLANEOUS) ×2
BLADE SURG ROTATE 9660 (MISCELLANEOUS) IMPLANT
CANISTER SUCTION 2500CC (MISCELLANEOUS) ×2 IMPLANT
CHLORAPREP W/TINT 26ML (MISCELLANEOUS) ×2 IMPLANT
CLIP APPLIE 5 13 M/L LIGAMAX5 (MISCELLANEOUS) ×1 IMPLANT
CLOTH BEACON ORANGE TIMEOUT ST (SAFETY) ×2 IMPLANT
COVER MAYO STAND STRL (DRAPES) ×2 IMPLANT
COVER SURGICAL LIGHT HANDLE (MISCELLANEOUS) ×2 IMPLANT
DECANTER SPIKE VIAL GLASS SM (MISCELLANEOUS) ×2 IMPLANT
DRAPE C-ARM 42X72 X-RAY (DRAPES) ×2 IMPLANT
DRAPE WARM FLUID 44X44 (DRAPE) ×2 IMPLANT
DRSG TEGADERM 4X4.75 (GAUZE/BANDAGES/DRESSINGS) ×2 IMPLANT
ELECT REM PT RETURN 9FT ADLT (ELECTROSURGICAL) ×2
ELECTRODE REM PT RTRN 9FT ADLT (ELECTROSURGICAL) ×1 IMPLANT
ENDOLOOP SUT PDS II  0 18 (SUTURE)
ENDOLOOP SUT PDS II 0 18 (SUTURE) IMPLANT
GAUZE SPONGE 2X2 8PLY STRL LF (GAUZE/BANDAGES/DRESSINGS) ×1 IMPLANT
GLOVE BIO SURGEON STRL SZ7 (GLOVE) ×2 IMPLANT
GLOVE BIO SURGEON STRL SZ7.5 (GLOVE) ×2 IMPLANT
GLOVE BIOGEL PI IND STRL 6.5 (GLOVE) ×1 IMPLANT
GLOVE BIOGEL PI IND STRL 7.0 (GLOVE) ×2 IMPLANT
GLOVE BIOGEL PI IND STRL 7.5 (GLOVE) ×1 IMPLANT
GLOVE BIOGEL PI IND STRL 8 (GLOVE) ×1 IMPLANT
GLOVE BIOGEL PI INDICATOR 6.5 (GLOVE) ×1
GLOVE BIOGEL PI INDICATOR 7.0 (GLOVE) ×2
GLOVE BIOGEL PI INDICATOR 7.5 (GLOVE) ×1
GLOVE BIOGEL PI INDICATOR 8 (GLOVE) ×1
GLOVE ECLIPSE 6.5 STRL STRAW (GLOVE) ×2 IMPLANT
GLOVE ECLIPSE 8.0 STRL XLNG CF (GLOVE) ×2 IMPLANT
GOWN PREVENTION PLUS XLARGE (GOWN DISPOSABLE) ×2 IMPLANT
GOWN STRL NON-REIN LRG LVL3 (GOWN DISPOSABLE) ×6 IMPLANT
KIT BASIN OR (CUSTOM PROCEDURE TRAY) ×2 IMPLANT
KIT ROOM TURNOVER OR (KITS) ×2 IMPLANT
NEEDLE 22X1 1/2 (OR ONLY) (NEEDLE) ×2 IMPLANT
NS IRRIG 1000ML POUR BTL (IV SOLUTION) ×2 IMPLANT
PAD ARMBOARD 7.5X6 YLW CONV (MISCELLANEOUS) ×4 IMPLANT
POUCH SPECIMEN RETRIEVAL 10MM (ENDOMECHANICALS) IMPLANT
SCALPEL HARMONIC ACE (MISCELLANEOUS) ×2 IMPLANT
SCISSORS LAP 5X35 DISP (ENDOMECHANICALS) IMPLANT
SET CHOLANGIOGRAPH 5 50 .035 (SET/KITS/TRAYS/PACK) ×2 IMPLANT
SET IRRIG TUBING LAPAROSCOPIC (IRRIGATION / IRRIGATOR) ×2 IMPLANT
SPECIMEN JAR SMALL (MISCELLANEOUS) ×2 IMPLANT
SPONGE GAUZE 2X2 STER 10/PKG (GAUZE/BANDAGES/DRESSINGS) ×1
SUT MNCRL AB 4-0 PS2 18 (SUTURE) ×2 IMPLANT
SUT VICRYL 0 TIES 12 18 (SUTURE) IMPLANT
SUT VICRYL 0 UR6 27IN ABS (SUTURE) ×2 IMPLANT
TOWEL OR 17X26 10 PK STRL BLUE (TOWEL DISPOSABLE) ×2 IMPLANT
TRAY LAPAROSCOPIC (CUSTOM PROCEDURE TRAY) ×2 IMPLANT
TROCAR 5M 150ML BLDLS (TROCAR) ×2 IMPLANT
TROCAR XCEL NON-BLD 5MMX100MML (ENDOMECHANICALS) ×2 IMPLANT
TROCAR Z-THREAD FIOS 5X100MM (TROCAR) ×2 IMPLANT
WATER STERILE IRR 1000ML POUR (IV SOLUTION) IMPLANT

## 2012-07-21 NOTE — H&P (Signed)
Elizabeth Morton  [birth year] [id number]   This patient is a 57 y.o.female who presents today for surgical evaluation   Reason for evaluation: Upper abdominal pain and gallstones. Probable biliary colic.   Pleasant overweight female. Developed sharp epigastric and right upper quadrant pain. Radiate to her back. Became very nauseated and uncomfortable. Vomited. Symptoms went down. Two other episodes that have been milder. TUMS does not help. Not consistent with heartburn or reflux. Thought the first episode may have been due to a "stomach bug". However with recurrent symptoms, went and saw her primary care physician. Concern for gallbladder etiology. Ultrasound showed stones. Dr. Chilton Si sent the patient to me for evaluation.  No personal nor family history of GI/colon cancer, inflammatory bowel disease, irritable bowel syndrome, allergy such as Celiac Sprue, dietary/dairy problems, colitis, ulcers nor gastritis. No recent sick contacts/gastroenteritis. No travel outside the country. No changes in diet. She has a bowel movement about twice a week. She has never had a colonoscopy - does not want one   No events since last visit   Past Medical History  Diagnosis Date  . Warfarin anticoagulation     coumadin therapy  . Hypertension   . Hx of CABG     2006,LIMA to LAD, SVG to diagonal  . 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 Dyslipidemia   . CAD (coronary artery disease)   . Heart valve replaced by other means 03/2005    bentall procedure.#21 st.jude mechanical vavle conduit with re- implantation of coronaries. good valve function ..echo 10/09,mechanical valve working well.. echo 10/11.  . S/P aortic valve replacement     Bental procedure,#21 St. Jude mechanical valve conduit with reimplantation of the coronaries 2006 / valve working well, echo, October, 2009 / valve working well echo, October, 2011  . Indigestion 10/11  . Aortic stenosis     Aortic valve  replacement 2006  . Ejection fraction     60%, echo, 2009 / 55-60%, echo, October, 2011  . Ecchymosis     From Coumadin and aspirin  . Preop cardiovascular exam     Cardiac clearance for gallbladder surgery November, 2013  . Pericardial effusion     Insignificant small pericardial effusion seen on echo, November, 2013  . SOB (shortness of breath)     none now  . Heart murmur     hx  . Stroke 06    lft weaker  . Peripheral vascular disease 06    blood clot -stroke  . GERD (gastroesophageal reflux disease)     Past Surgical History  Procedure Date  . Coronary artery bypass graft   . Cardiac valve replacement     aortic  . Tonsillectomy   . Back surgery     fusion  8th grade    History   Social History  . Marital Status: Married    Spouse Name: N/A    Number of Children: N/A  . Years of Education: N/A   Occupational History  . Not on file.   Social History Main Topics  . Smoking status: Former Smoker -- 1.0 packs/day for 25 years    Types: Cigarettes    Quit date: 07/15/2004  . Smokeless tobacco: Never Used     Comment: quit 2006  . Alcohol Use: No  . Drug Use: No  . Sexually Active: Not on file   Other Topics Concern  . Not on file   Social History Narrative   She  lives in [place] with her husband and son. She previously smoked 37-pack-years,but quit following her stroke in April of 2006. She denies any alcohol or drugs. She has not been routinely exercising.    Family History  Problem Relation Age of Onset  . Stroke Mother   . Alcohol abuse Father   . Hypertension Father   . Stroke Father   . Hyperlipidemia Father   . Stroke Brother     she has three and at least one of them has had a stroke    Current Facility-Administered Medications  Medication Dose Route Frequency Provider Last Rate Last Dose  . ceFAZolin (ANCEF) 3 g in dextrose 5 % 50 mL IVPB  3 g Intravenous 60 min Pre-Op Ardeth Sportsman, MD      . chlorhexidine (HIBICLENS) 4 % liquid 1  application  1 application Topical Once Ardeth Sportsman, MD      . chlorhexidine (HIBICLENS) 4 % liquid 1 application  1 application Topical Once Ardeth Sportsman, MD      . gentamicin (GARAMYCIN) IVPB 100 mg  100 mg Intravenous On Call to OR Ardeth Sportsman, MD       Facility-Administered Medications Ordered in Other Encounters  Medication Dose Route Frequency Provider Last Rate Last Dose  . lactated ringers infusion    Continuous PRN Jeani Hawking, CRNA         No Known Allergies   BP 124/78  Pulse 68  Temp 97.9 F (36.6 C) (Oral)  Resp 18  SpO2 94%  Results:   Labs: Results for orders placed during the hospital encounter of 07/21/12 (from the past 48 hour(s))  APTT     Status: Normal   Collection Time   07/21/12  6:28 AM      Component Value Range Comment   aPTT 36  24 - 37 seconds   PROTIME-INR     Status: Normal   Collection Time   07/21/12  6:28 AM      Component Value Range Comment   Prothrombin Time 13.3  11.6 - 15.2 seconds    INR 1.02  0.00 - 1.49     Imaging / Studies: Dg Chest 2 View  07/15/2012  *RADIOLOGY REPORT*  Clinical Data: Preadmission  CHEST - 2 VIEW  Comparison: 04/04/2005  Findings: Cardiomediastinal silhouette is stable.  Again noted status post median sternotomy and aortic valve replacement.  No acute infiltrate or pleural effusion.  No pulmonary edema.  Bony thorax is stable.  IMPRESSION: No active disease.   Original Report Authenticated By: Natasha Mead, M.D.     Medications / Allergies: per chart  Antibiotics: Anti-infectives     Start     Dose/Rate Route Frequency Ordered Stop   07/21/12 0600   gentamicin (GARAMYCIN) IVPB 100 mg     Comments: The patient has mechanical heart valve.  Pharmacy may adjust dosing strength, schedule, rate of infusion, etc as needed to optimize therapy      100 mg 200 mL/hr over 30 Minutes Intravenous On call to O.R. 07/20/12 1421 07/22/12 0559   07/20/12 1421   ceFAZolin (ANCEF) 3 g in dextrose 5 % 50 mL IVPB          3 g 160 mL/hr over 30 Minutes Intravenous 60 min pre-op 07/20/12 1421            Review of Systems  Constitutional: Negative for fever, chills, diaphoresis, appetite change and fatigue.  HENT: Negative for ear pain, sore throat,  trouble swallowing, neck pain and ear discharge.  Eyes: Negative for photophobia, discharge and visual disturbance.  Respiratory: Negative for cough, choking, chest tightness and shortness of breath.  Cardiovascular: Negative for chest pain and palpitations.  Gastrointestinal: Positive for constipation. Negative for nausea, vomiting, abdominal pain, diarrhea, blood in stool, abdominal distention, anal bleeding and rectal pain.  Genitourinary: Negative for dysuria, frequency and difficulty urinating.  Musculoskeletal: Negative for myalgias and gait problem.  Skin: Negative for color change, pallor and rash.  Neurological: Negative for dizziness, speech difficulty, weakness and numbness.  Hematological: Negative for adenopathy.  Psychiatric/Behavioral: Negative for confusion and agitation. The patient is not nervous/anxious.  Objective:   Physical Exam  Constitutional: She is oriented to person, place, and time. She appears well-developed and well-nourished. No distress.  HENT:  Head: Normocephalic.  Mouth/Throat: Oropharynx is clear and moist. No oropharyngeal exudate.  Eyes: Conjunctivae normal and EOM are normal. Pupils are equal, round, and reactive to light. No scleral icterus.  Neck: Normal range of motion. Neck supple. No tracheal deviation present.  Cardiovascular: Normal rate, regular rhythm and intact distal pulses.  Pulmonary/Chest: Effort normal and breath sounds normal. No respiratory distress. She exhibits no tenderness.  Abdominal: Soft. She exhibits no distension, no pulsatile liver, no abdominal bruit and no mass. There is no CVA tenderness, no tenderness at McBurney's point and negative Murphy's sign. No hernia. Hernia confirmed negative  in the right inguinal area and confirmed negative in the left inguinal area.    Genitourinary: No vaginal discharge found.  Musculoskeletal: Normal range of motion. She exhibits no tenderness.  Lymphadenopathy:  She has no cervical adenopathy.  Right: No inguinal adenopathy present.  Left: No inguinal adenopathy present.  Neurological: She is alert and oriented to person, place, and time. No cranial nerve deficit. She exhibits normal muscle tone. Coordination normal.  Skin: Skin is warm and dry. No rash noted. She is not diaphoretic. No erythema.  Psychiatric: She has a normal mood and affect. Her behavior is normal. Judgment and thought content normal.  Assessment:   Symptomatic gallstones with probable chronic cholecystitis.   Plan:    Laparoscopic cholecystectomy. Reasonable to start with single site technique even with large GS:  The anatomy & physiology of hepatobiliary & pancreatic function was discussed. The pathophysiology of gallbladder dysfunction was discussed. Natural history risks without surgery was discussed. I feel the risks of no intervention will lead to serious problems that outweigh the operative risks; therefore, I recommended cholecystectomy to remove the pathology. I explained laparoscopic techniques with possible need for an open approach. Probable cholangiogram to evaluate the bilary tract was explained as well.  Risks such as bleeding, infection, abscess, leak, injury to other organs, need for further treatment, heart attack, death, and other risks were discussed. I noted a good likelihood this will help address the problem. Possibility that this will not correct all abdominal symptoms was explained. Goals of post-operative recovery were discussed as well. We will work to minimize complications. An educational handout further explaining the pathology and treatment options was given as well. Questions were answered. The patient expresses understanding & wishes to proceed  with surgery.  I am concerned about the health of the patient and the ability to tolerate the operation. With the patient fully anticoagulated, I need to get a sense of what should be done with her warfarin, etc. If Lovenox bridge needed, I would like to hold off on restarting Lovenox bridge until a couple days postop. Therefore, we will request clearance  by cardiology to better assess operative risk & see if a reevaluation, further workup, adjustment to medications, etc is needed.  I think she would benefit from being on a fiber bowel regimen to get her bowels more regular. I stressed the importance of a bowel regimen to have daily soft bowel movements to minimize progression of disease. Goal of one BM / day ideal. Educational handouts further explaining bowel regimen were given as well. I strongly recommend she have a colonoscopy. She initially did not want to have one. She seem more open to the idea after discussion. The patient expressed understanding.   I have re-reviewed the the patient's records, history, medications, and allergies.  I have re-examined the patient.  I again discussed intraoperative plans and goals of post-operative recovery.  The patient agrees to proceed.    Ardeth Sportsman, M.D., F.A.C.S. Gastrointestinal and Minimally Invasive Surgery [facility] [address], [address] [place], [place] [address] [phone] Main / Paging [phone] Voice Mail   07/21/2012  CARE TEAM:  PCP: Enrique Sack, MD  Outpatient Care Team: Patient Care Team: Enrique Sack, MD as PCP - General Luis Abed, MD as Consulting Physician (Cardiology) Ardeth Sportsman, MD as Attending Physician (General Surgery)  Inpatient Treatment Team: Treatment Team: Attending Provider: Ardeth Sportsman, MD

## 2012-07-21 NOTE — Anesthesia Preprocedure Evaluation (Addendum)
Anesthesia Evaluation  Patient identified by MRN, date of birth, ID band Patient awake    Reviewed: Allergy & Precautions, H&P , NPO status , Patient's Chart, lab work & pertinent test results, reviewed documented beta blocker date and time   Airway Mallampati: II TM Distance: >3 FB Neck ROM: full    Dental  (+) Edentulous Upper, Edentulous Lower and Dental Advisory Given   Pulmonary shortness of breath, former smoker,  breath sounds clear to auscultation        Cardiovascular hypertension, Pt. on medications + CAD, + CABG and + Peripheral Vascular Disease + Valvular Problems/Murmurs Rhythm:regular     Neuro/Psych CVA negative psych ROS   GI/Hepatic Neg liver ROS, GERD-  Medicated and Controlled,  Endo/Other  negative endocrine ROS  Renal/GU negative Renal ROS  negative genitourinary   Musculoskeletal   Abdominal   Peds  Hematology negative hematology ROS (+)   Anesthesia Other Findings See surgeon's H&P   Reproductive/Obstetrics negative OB ROS                         Anesthesia Physical Anesthesia Plan  ASA: III  Anesthesia Plan: General   Post-op Pain Management:    Induction: Intravenous  Airway Management Planned: Oral ETT  Additional Equipment:   Intra-op Plan:   Post-operative Plan: Extubation in OR  Informed Consent: I have reviewed the patients History and Physical, chart, labs and discussed the procedure including the risks, benefits and alternatives for the proposed anesthesia with the patient or authorized representative who has indicated his/her understanding and acceptance.   Dental Advisory Given  Plan Discussed with: CRNA and Surgeon  Anesthesia Plan Comments:         Anesthesia Quick Evaluation

## 2012-07-21 NOTE — Preoperative (Signed)
Beta Blockers   Reason not to administer Beta Blockers:Not Applicable 

## 2012-07-21 NOTE — Anesthesia Postprocedure Evaluation (Signed)
Anesthesia Post Note  Patient: Elizabeth Morton  Procedure(s) Performed: Procedure(s) (LRB): LAPAROSCOPIC CHOLECYSTECTOMY SINGLE PORT (N/A)  Anesthesia type: General  Patient location: PACU  Post pain: Pain level controlled  Post assessment: Patient's Cardiovascular Status Stable  Last Vitals:  Filed Vitals:   07/21/12 0935  BP: 139/60  Pulse: 77  Temp: 36.2 C  Resp: 18    Post vital signs: Reviewed and stable  Level of consciousness: alert  Complications: No apparent anesthesia complications

## 2012-07-21 NOTE — Op Note (Signed)
07/21/2012  8:44 AM  PATIENT:  Elizabeth Morton  57 y.o. 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  INDICATION: Pt with intermittent abdominal pain concerning for biliary colic  The anatomy & physiology of hepatobiliary & pancreatic function was discussed.  The pathophysiology of gallbladder dysfunction was discussed.  Natural history risks without surgery was discussed.   I feel the risks of no intervention will lead to serious problems that outweigh the operative risks; therefore, I recommended cholecystectomy to remove the pathology.  I explained laparoscopic techniques with possible need for an open approach.  Probable cholangiogram to evaluate the bilary tract was explained as well.    Risks such as bleeding, infection, abscess, leak, injury to other organs, need for further treatment, heart attack, death, and other risks were discussed.  I noted a good likelihood this will help address the problem.  Possibility that this will not correct all abdominal symptoms was explained.  Goals of post-operative recovery were discussed as well.  We will work to minimize complications.  An  educational handout further explaining the pathology and treatment options was given as well.  Questions were answered.  The patient expresses understanding & wishes to proceed with surgery.  OR FINDINGS: Moderate adhesions to liver c/w FHC syndrome.  Omental adhesions & GB wall thickening c/w chronic cholecystitis  DESCRIPTION:   The patient was identified & brought in the operating room. The patient was positioned supine with arms tucked. SCDs were active during the entire case. The patient underwent general anesthesia without any difficulty.  The abdomen was prepped and draped in a sterile fashion. A Surgical Timeout confirmed our plan.  I made a transverse curvilinear incision through the superior umbilical fold.  I placed a 5mm long port through the supraumbilical fascia using a modified Hassan cutdown technique. I began carbon dioxide insufflation. Camera inspection revealed no injury. There were no adhesions to the anterior abdominal wall supraumbilically.  I proceeded to continue with single site technique. I placed a #5 port in left upper aspect of the wound. I placed a 5 mm atraumatic grasper in the right inferior aspect of the wound.  I turned attention to the right upper quadrant.  Moderate adhesions were seen from the liver to the diaphragm.  I feed those off with the Harmonic scalpel.  The gallbladder fundus was elevated cephalad. I released moderate omental adhesions off the gallbladder & liver.  I freed the peritoneal coverings between the gallbladder and the liver on the posteriolateral and anteriomedial walls. I alternated between Harmonic & blunt Maryland dissection to help get a good critical view of the cystic artery and cystic duct. I did further dissection to free a few centimeters of the  gallbladder  off the liver bed to get a good critical view of the infundibulum and cystic duct. I mobilized the cystic artery; and, after getting a good 360 view, ligated the cystic artery using  the Harmonic ultrasonic dissection. I skeletonized the cystic duct.  I placed a clip on the infundibulum. I did a partial cystic duct-otomy and ensured patency. I placed a 5 [place] cholangiocatheter through a puncture site at the right subcostal ridge of the abdominal wall and directed it into the cystic duct.  We ran a cholangiogram with dilute radio-opaque contrast and continuous fluoroscopy. Contrast flowed from a side branch consistent with cystic duct cannulization. Contrast flowed up the common hepatic duct into the right and left intrahepatic chains out to secondary radicals. Contrast flowed down the common bile duct easily across the normal ampulla into the duodenum.  Mild reflux up the pancreatic duct as well.  This was consistent with a normal cholangiogram.  I removed the cholangiocatheter. I placed clips on the cystic duct x4.  I completed cystic duct transection. I freed the gallbladder from its remaining attachments to the liver. I ensured hemostasis on the gallbladder fossa of the liver and elsewhere. I inspected the rest of the abdomen & detected no injury nor bleeding elsewhere.  I removed the gallbladder out the supraumbilical fascia. I closed the fascia transversely using 0 Vicryl interrupted stitches. A closed the skin using 4-0 monocryl stitch.  Sterile dressing was applied. The patient was extubated & arrived in the PACU in stable condition..  I had discussed postoperative care with the patient in the holding area. I am about to locate the patient's family and discuss operative findings and postoperative goals / instructions.  Instructions are written in the chart as well.

## 2012-07-21 NOTE — Transfer of Care (Signed)
Immediate Anesthesia Transfer of Care Note  Patient: Elizabeth Morton  Procedure(s) Performed: Procedure(s) (LRB) with comments: LAPAROSCOPIC CHOLECYSTECTOMY SINGLE PORT (N/A)  Patient Location: PACU  Anesthesia Type:General  Level of Consciousness: awake, alert  and oriented  Airway & Oxygen Therapy: Patient Spontanous Breathing and Patient connected to nasal cannula oxygen  Post-op Assessment: Report given to PACU RN and Post -op Vital signs reviewed and stable  Post vital signs: Reviewed and stable  Complications: No apparent anesthesia complications

## 2012-07-22 ENCOUNTER — Encounter (HOSPITAL_COMMUNITY): Payer: Self-pay | Admitting: Surgery

## 2012-07-24 ENCOUNTER — Encounter (HOSPITAL_COMMUNITY): Payer: Self-pay

## 2012-07-24 ENCOUNTER — Emergency Department (HOSPITAL_COMMUNITY)
Admission: EM | Admit: 2012-07-24 | Discharge: 2012-07-25 | Disposition: A | Discharge status: Home / Self Care | Payer: 59 | Attending: Emergency Medicine | Admitting: Emergency Medicine

## 2012-07-24 ENCOUNTER — Telehealth (INDEPENDENT_AMBULATORY_CARE_PROVIDER_SITE_OTHER): Payer: Self-pay | Admitting: General Surgery

## 2012-07-24 DIAGNOSIS — Z8709 Personal history of other diseases of the respiratory system: Secondary | ICD-10-CM | POA: Insufficient documentation

## 2012-07-24 DIAGNOSIS — K219 Gastro-esophageal reflux disease without esophagitis: Secondary | ICD-10-CM | POA: Insufficient documentation

## 2012-07-24 DIAGNOSIS — I10 Essential (primary) hypertension: Secondary | ICD-10-CM | POA: Insufficient documentation

## 2012-07-24 DIAGNOSIS — Z954 Presence of other heart-valve replacement: Secondary | ICD-10-CM | POA: Insufficient documentation

## 2012-07-24 DIAGNOSIS — Z79899 Other long term (current) drug therapy: Secondary | ICD-10-CM | POA: Insufficient documentation

## 2012-07-24 DIAGNOSIS — E785 Hyperlipidemia, unspecified: Secondary | ICD-10-CM | POA: Insufficient documentation

## 2012-07-24 DIAGNOSIS — Z9089 Acquired absence of other organs: Secondary | ICD-10-CM | POA: Insufficient documentation

## 2012-07-24 DIAGNOSIS — Z951 Presence of aortocoronary bypass graft: Secondary | ICD-10-CM | POA: Insufficient documentation

## 2012-07-24 DIAGNOSIS — Z8673 Personal history of transient ischemic attack (TIA), and cerebral infarction without residual deficits: Secondary | ICD-10-CM | POA: Insufficient documentation

## 2012-07-24 DIAGNOSIS — IMO0002 Reserved for concepts with insufficient information to code with codable children: Secondary | ICD-10-CM | POA: Insufficient documentation

## 2012-07-24 DIAGNOSIS — R011 Cardiac murmur, unspecified: Secondary | ICD-10-CM | POA: Insufficient documentation

## 2012-07-24 DIAGNOSIS — Z7901 Long term (current) use of anticoagulants: Secondary | ICD-10-CM | POA: Insufficient documentation

## 2012-07-24 DIAGNOSIS — Z87891 Personal history of nicotine dependence: Secondary | ICD-10-CM | POA: Insufficient documentation

## 2012-07-24 DIAGNOSIS — Y846 Urinary catheterization as the cause of abnormal reaction of the patient, or of later complication, without mention of misadventure at the time of the procedure: Secondary | ICD-10-CM | POA: Insufficient documentation

## 2012-07-24 DIAGNOSIS — Z8679 Personal history of other diseases of the circulatory system: Secondary | ICD-10-CM | POA: Insufficient documentation

## 2012-07-24 DIAGNOSIS — I2510 Atherosclerotic heart disease of native coronary artery without angina pectoris: Secondary | ICD-10-CM | POA: Insufficient documentation

## 2012-07-24 NOTE — Telephone Encounter (Signed)
She had a single site laparoscopic cholecystectomy on 07/21/12.  She is on chronic anticoagulation therapy for a mechanical heart valve.  I spoke with her in the early afternoon because some blood was building up underneath her bandage.  I told her to monitor it and call back if it started to leak out of the bandage.  She called and said the blood was draining out of the bandage.  She has been taking Lovenox shots and restarted her Coumadin on 1/10.  I told her to apply direct pressure to the area with a bandage and if she had active bleeding despite this I instructed her to go to the nearest emergency room.  This is likely a medically bleeding from the anticoagulants and will need prolonged direct pressure to stop.  I also told her to stop the Lovenox and Coumadin for now.

## 2012-07-24 NOTE — ED Notes (Signed)
Pt reports having gallbladder Thursday and noticed blood forming under her bandage coming from her surgical site. Pt presents w/dressing in place, blood remains under dressing

## 2012-07-25 ENCOUNTER — Telehealth (INDEPENDENT_AMBULATORY_CARE_PROVIDER_SITE_OTHER): Payer: Self-pay

## 2012-07-25 LAB — APTT: aPTT: 49 seconds — ABNORMAL HIGH (ref 24–37)

## 2012-07-25 LAB — BASIC METABOLIC PANEL
GFR calc Af Amer: 73 mL/min — ABNORMAL LOW (ref >90)
GFR calc non Af Amer: 63 mL/min — ABNORMAL LOW (ref >90)
Potassium: 3.7 mEq/L (ref 3.5–5.1)
Sodium: 138 mEq/L (ref 135–145)

## 2012-07-25 LAB — PROTIME-INR: INR: 1.19 (ref 0.00–1.49)

## 2012-07-25 LAB — CBC
MCHC: 33.7 g/dL (ref 30.0–36.0)
RDW: 13.9 % (ref 11.5–15.5)

## 2012-07-25 NOTE — ED Provider Notes (Signed)
History     CSN: [id number]  Arrival date & time 07/24/12  2239   First MD Initiated Contact with Patient 07/24/12 2258      Chief Complaint  Patient presents with  . Coagulation Disorder    (Consider location/radiation/quality/duration/timing/severity/associated sxs/prior treatment) HPI Hx per PT, had lap chole 4 days ago, is on coumadin for AVR, stopped for surgery and was restarted with lovenox bridge until therapuetic.  She presents today with bleeding from periumbilical wound site, her dressing is saturated and has been in place since surgery. No other bleeding. No inc ABD pain. No F/C. No bruising, no blood in stools. She called her surgeon and was referred here for further evaluation. MOD in severity.  Past Medical History  Diagnosis Date  . Warfarin anticoagulation     coumadin therapy  . Hypertension   . Hx of CABG     2006,LIMA to LAD, SVG to diagonal  . 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 Dyslipidemia   . CAD (coronary artery disease)   . Heart valve replaced by other means 03/2005    bentall procedure.#21 st.jude mechanical vavle conduit with re- implantation of coronaries. good valve function ..echo 10/09,mechanical valve working well.. echo 10/11.  . S/P aortic valve replacement     Bental procedure,#21 St. Jude mechanical valve conduit with reimplantation of the coronaries 2006 / valve working well, echo, October, 2009 / valve working well echo, October, 2011  . Indigestion 10/11  . Aortic stenosis     Aortic valve replacement 2006  . Ejection fraction     60%, echo, 2009 / 55-60%, echo, October, 2011  . Ecchymosis     From Coumadin and aspirin  . Preop cardiovascular exam     Cardiac clearance for gallbladder surgery November, 2013  . Pericardial effusion     Insignificant small pericardial effusion seen on echo, November, 2013  . SOB (shortness of breath)     none now  . Heart murmur     hx  . Stroke 06    lft  weaker  . Peripheral vascular disease 06    blood clot -stroke  . GERD (gastroesophageal reflux disease)     Past Surgical History  Procedure Date  . Coronary artery bypass graft   . Cardiac valve replacement     aortic  . Tonsillectomy   . Back surgery     fusion  8th grade  . Laparoscopic cholecystectomy single port 07/21/2012    Procedure: LAPAROSCOPIC CHOLECYSTECTOMY SINGLE PORT;  Surgeon: Ardeth Sportsman, MD;  Location: [facility] OR;  Service: General;  Laterality: N/A;  . Cholecystectomy     Family History  Problem Relation Age of Onset  . Stroke Mother   . Alcohol abuse Father   . Hypertension Father   . Stroke Father   . Hyperlipidemia Father   . Stroke Brother     she has three and at least one of them has had a stroke    History  Substance Use Topics  . Smoking status: Former Smoker -- 1.0 packs/day for 25 years    Types: Cigarettes    Quit date: 07/15/2004  . Smokeless tobacco: Never Used     Comment: quit 2006  . Alcohol Use: No    OB History    Grav Para Term Preterm Abortions TAB SAB Ect Mult Living  Review of Systems  Constitutional: Negative for fever and chills.  HENT: Negative for neck pain and neck stiffness.   Respiratory: Negative for shortness of breath.   Cardiovascular: Negative for chest pain.  Gastrointestinal: Negative for vomiting and abdominal distention.  Genitourinary: Negative for dysuria.  Musculoskeletal: Negative for back pain.  Skin: Negative for rash.  Neurological: Negative for syncope.  All other systems reviewed and are negative.    Allergies  Review of patient's allergies indicates no known allergies.  Home Medications   Current Outpatient Rx  Name  Route  Sig  Dispense  Refill  . CALCIUM CARBONATE-VITAMIN D 600-400 MG-UNIT PO TABS   Oral   Take 1 tablet by mouth 2 (two) times daily.         Marland Kitchen ENOXAPARIN SODIUM 60 MG/0.6ML [place] SOLN   Subcutaneous   Inject 60 mg into the skin every 12 (twelve)  hours. To start on 07/17/2012         . FUROSEMIDE 20 MG PO TABS   Oral   Take 20 mg by mouth daily.         Marland Kitchen OMEPRAZOLE 20 MG PO CPDR   Oral   Take 20 mg by mouth daily.           . OXYCODONE HCL 5 MG PO TABS   Oral   Take 5-10 mg by mouth every 6 (six) hours as needed. For pain         . POTASSIUM CHLORIDE ER 10 MEQ PO TBCR   Oral   Take 1 tablet (10 mEq total) by mouth daily.   30 tablet   6   . SIMVASTATIN 40 MG PO TABS   Oral   Take 40 mg by mouth at bedtime.           Marland Kitchen TRIAZOLAM 0.25 MG PO TABS   Oral   Take 0.25 mg by mouth at bedtime.         . WARFARIN SODIUM 3 MG PO TABS   Oral   Take 1.5-3 mg by mouth See admin instructions. Takes 1.5mg  on Tuesday and Saturday and 3mg  on all other days           BP 176/59  Pulse 100  Temp 98.3 F (36.8 C) (Oral)  Resp 18  SpO2 93%  Physical Exam  Constitutional: She is oriented to person, place, and time. She appears well-developed and well-nourished.  HENT:  Head: Normocephalic and atraumatic.  Eyes: EOM are normal. Pupils are equal, round, and reactive to light.  Neck: Neck supple.  Cardiovascular: Normal rate, regular rhythm and intact distal pulses.   Pulmonary/Chest: Effort normal and breath sounds normal. No respiratory distress.  Abdominal:       No point tenderness, no acute ABD. Periumbilical surgical site cleaned and no active bleeding. Sterile bandage and 4x4 placed.   Musculoskeletal: Normal range of motion. She exhibits no edema.  Neurological: She is alert and oriented to person, place, and time.  Skin: Skin is warm and dry.    ED Course  Procedures (including critical care time)   Results for orders placed during the hospital encounter of 07/24/12  CBC      Component Value Range   WBC 7.6  4.0 - 10.5 K/uL   RBC 4.46  3.87 - 5.11 MIL/uL   Hemoglobin 12.8  12.0 - 15.0 g/dL   HCT 69.6  29.5 - 28.4 %   MCV 85.2  78.0 - 100.0 fL   MCH  28.7  26.0 - 34.0 pg   MCHC 33.7  30.0 - 36.0  g/dL   RDW 16.1  09.6 - 04.5 %   Platelets 282  150 - 400 K/uL  BASIC METABOLIC PANEL      Component Value Range   Sodium 138  135 - 145 mEq/L   Potassium 3.7  3.5 - 5.1 mEq/L   Chloride 101  96 - 112 mEq/L   CO2 25  19 - 32 mEq/L   Glucose, Bld 116 (*) 70 - 99 mg/dL   BUN 9  6 - 23 mg/dL   Creatinine, Ser 4.09  0.50 - 1.10 mg/dL   Calcium 9.6  8.4 - 81.1 mg/dL   GFR calc non Af Amer 63 (*) >90 mL/min   GFR calc Af Amer 73 (*) >90 mL/min  PROTIME-INR      Component Value Range   Prothrombin Time 14.9  11.6 - 15.2 seconds   INR 1.19  0.00 - 1.49  APTT      Component Value Range   aPTT 49 (*) 24 - 37 seconds   D/w Dr Lindie Spruce in the ED, he recs clean wound site and MED admit if INR elevated.   Dressing changed and site cleaned - sterile bandage replaced and PT observed in the ED, ambulates with no further bleeding. INR reviewed  MDM   Minor post op bleeding in PT with AVR restarted on coumadin and on lovenox - followed by [facility].  Plan close PCP follow up and will keep her appt in 3 days for recheck in clinic. Stable for d/c home.         Sunnie Nielsen, MD 07/25/12 [phone]

## 2012-07-25 NOTE — Telephone Encounter (Signed)
The pt has had some bleeding that sent her to the er.  They redressed her incision and told her to call DR Gross today and ask if she needs to start her Lovenox again.  She hasn't taken it today.  I paged Dr Michaell Cowing and he said stay off of the Lovenox until Wednesday.  I told the pt and she has an appointment Wednesday at the coumadin clinic.

## 2012-07-25 NOTE — Progress Notes (Signed)
Follow up phone call made and pt has had no complaints of care while she was here.States she has been to the ER because dressings have become saturated.Has let Dr.Gross know and will continue to monitor.

## 2012-07-25 NOTE — ED Notes (Signed)
Dressing removed from the umbilicus.  Blood gelled and folded 4x4 removed from the small incision at the umbilicus.  Area cleaned with NS, corner of 4x4 in the incision area and small amount of blood return.  5 4x4's folded in half and tegaderm on top to form a pressure dressing.  Patient to sit up on the side of the bed - no new bleeding noted

## 2012-07-27 ENCOUNTER — Telehealth (INDEPENDENT_AMBULATORY_CARE_PROVIDER_SITE_OTHER): Payer: Self-pay | Admitting: General Surgery

## 2012-07-27 ENCOUNTER — Ambulatory Visit (INDEPENDENT_AMBULATORY_CARE_PROVIDER_SITE_OTHER): Payer: 59 | Admitting: *Deleted

## 2012-07-27 DIAGNOSIS — I359 Nonrheumatic aortic valve disorder, unspecified: Secondary | ICD-10-CM

## 2012-07-27 DIAGNOSIS — I639 Cerebral infarction, unspecified: Secondary | ICD-10-CM

## 2012-07-27 DIAGNOSIS — I6350 Cerebral infarction due to unspecified occlusion or stenosis of unspecified cerebral artery: Secondary | ICD-10-CM

## 2012-07-27 DIAGNOSIS — Z954 Presence of other heart-valve replacement: Secondary | ICD-10-CM

## 2012-07-27 DIAGNOSIS — Z7901 Long term (current) use of anticoagulants: Secondary | ICD-10-CM

## 2012-07-27 DIAGNOSIS — Z952 Presence of prosthetic heart valve: Secondary | ICD-10-CM

## 2012-07-27 NOTE — Telephone Encounter (Signed)
Pt called because her bandage (umbilical) is stuck to her with dried blood.  She is anticoagulated and wanted advised on how to remove the bandage without rebleeding.  Recommended she get in the shower and let the warm water saturated the gauze.  This will loosen it for removal.  She understands and will try this.

## 2012-08-08 ENCOUNTER — Ambulatory Visit (INDEPENDENT_AMBULATORY_CARE_PROVIDER_SITE_OTHER): Payer: 59 | Admitting: *Deleted

## 2012-08-08 DIAGNOSIS — I359 Nonrheumatic aortic valve disorder, unspecified: Secondary | ICD-10-CM

## 2012-08-08 DIAGNOSIS — Z954 Presence of other heart-valve replacement: Secondary | ICD-10-CM

## 2012-08-08 DIAGNOSIS — I639 Cerebral infarction, unspecified: Secondary | ICD-10-CM

## 2012-08-08 DIAGNOSIS — I6350 Cerebral infarction due to unspecified occlusion or stenosis of unspecified cerebral artery: Secondary | ICD-10-CM

## 2012-08-08 DIAGNOSIS — Z7901 Long term (current) use of anticoagulants: Secondary | ICD-10-CM

## 2012-08-08 DIAGNOSIS — Z952 Presence of prosthetic heart valve: Secondary | ICD-10-CM

## 2012-08-15 ENCOUNTER — Ambulatory Visit (INDEPENDENT_AMBULATORY_CARE_PROVIDER_SITE_OTHER): Payer: 59 | Admitting: Surgery

## 2012-08-15 ENCOUNTER — Encounter (INDEPENDENT_AMBULATORY_CARE_PROVIDER_SITE_OTHER): Payer: Self-pay | Admitting: Surgery

## 2012-08-15 VITALS — BP 130/82 | HR 80 | Temp 97.4°F | Resp 18 | Ht 62.0 in | Wt 144.4 lb

## 2012-08-15 DIAGNOSIS — K8010 Calculus of gallbladder with chronic cholecystitis without obstruction: Secondary | ICD-10-CM

## 2012-08-15 NOTE — Progress Notes (Signed)
Subjective:     Patient ID: Elizabeth Morton, female   DOB: March 25, 1956, 57 y.o.   MRN: [id number]  HPI  Elizabeth Morton  [birth year] [id number]  Patient Care Team: Enrique Sack, MD as PCP - General Luis Abed, MD as Consulting Physician (Cardiology) Ardeth Sportsman, MD as Attending Physician (General Surgery)  This patient is a 57 y.o.female who presents today for surgical evaluation   POST-OPERATIVE DIAGNOSIS:  CHRONIC CHOLECYSTITIS AND CHOLELITHIASIS  Fitz-Hugh-Curtis syndrome  PROCEDURE: Procedure(s):  LAPAROSCOPIC CHOLECYSTECTOMY SINGLE PORT  Laparoscopic lysis of adhesions   Diagnosis Gallbladder - CHRONIC CHOLECYSTITIS. - THERE IS NO EVIDENCE OF MALIGNANCY. Pecola Leisure MD Pathologist, Electronic Signature (Case signed 07/22/2012) Specimen Henrique Parekh and Clinical Information Specimen(s) Obtained: Gallbladder Specimen Clinical Information Chronic cholecystitis and cholelithiasis (tl) Jalen Daluz Size/?Intact: 6.8 x 2.3 x 2 cm previously incised gallbladder, received in formalin. Serosal surface: Smooth and hyperemic. Mucosa/Wall: The mucosa is glistening, tan red and the wall measures up to 0.5 cm in thickness. Contents: The gallbladder contains bile and thick green sludge. There are no calculi present in the gallbladder or specimen container. Cystic duct: 0.2 cm in length and patent. Block Summary: One block submitted. (GP:gt, 07/21/12)  The patient comes in today feeling pretty good.  Mild constipation.  She thinks it is due to the potassium supplements.  No nausea or vomiting.  Appetite fair which is her baseline.  Certainly not worse.  No more nausea or vomiting.  No attacks.  In good spirits.  One to get back to the tanning bed.  Walking around at home.  Did not want to leave house with recent ice/snow.  Getting out more often now.   Patient Active Problem List  Diagnosis  . SHORTNESS OF BREATH  . Warfarin anticoagulation  . Hypertension  . Hx of CABG  . CVA  (cerebral infarction)  . Dyslipidemia  . Chronic cholecystitis with calculus  . Constipation, chronic  . Needs screening Colonoscopy - Pt refused  . CAD (coronary artery disease)  . S/P aortic valve replacement  . Indigestion  . SOB (shortness of breath)  . Aortic stenosis  . Ejection fraction  . Ecchymosis  . Long term (current) use of anticoagulants  . Preop cardiovascular exam  . Pericardial effusion    Past Medical History  Diagnosis Date  . Warfarin anticoagulation     coumadin therapy  . Hypertension   . Hx of CABG     2006,LIMA to LAD, SVG to diagonal  . 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 Dyslipidemia   . CAD (coronary artery disease)   . Heart valve replaced by other means 03/2005    bentall procedure.#21 st.jude mechanical vavle conduit with re- implantation of coronaries. good valve function ..echo 10/09,mechanical valve working well.. echo 10/11.  . S/P aortic valve replacement     Bental procedure,#21 St. Jude mechanical valve conduit with reimplantation of the coronaries 2006 / valve working well, echo, October, 2009 / valve working well echo, October, 2011  . Indigestion 10/11  . Aortic stenosis     Aortic valve replacement 2006  . Ejection fraction     60%, echo, 2009 / 55-60%, echo, October, 2011  . Ecchymosis     From Coumadin and aspirin  . Preop cardiovascular exam     Cardiac clearance for gallbladder surgery November, 2013  . Pericardial effusion     Insignificant small pericardial effusion seen on echo, November, 2013  .  SOB (shortness of breath)     none now  . Heart murmur     hx  . Stroke 06    lft weaker  . Peripheral vascular disease 06    blood clot -stroke  . GERD (gastroesophageal reflux disease)     Past Surgical History  Procedure Date  . Coronary artery bypass graft   . Cardiac valve replacement     aortic  . Tonsillectomy   . Back surgery     fusion  8th grade  . Laparoscopic  cholecystectomy single port 07/21/2012    Procedure: LAPAROSCOPIC CHOLECYSTECTOMY SINGLE PORT;  Surgeon: Ardeth Sportsman, MD;  Location: [facility] OR;  Service: General;  Laterality: N/A;  . Cholecystectomy     History   Social History  . Marital Status: Married    Spouse Name: N/A    Number of Children: N/A  . Years of Education: N/A   Occupational History  . Not on file.   Social History Main Topics  . Smoking status: Former Smoker -- 1.0 packs/day for 25 years    Types: Cigarettes    Quit date: 07/15/2004  . Smokeless tobacco: Never Used     Comment: quit 2006  . Alcohol Use: No  . Drug Use: No  . Sexually Active: Not on file   Other Topics Concern  . Not on file   Social History Narrative   She lives in [place] with her husband and son. She previously smoked 37-pack-years,but quit following her stroke in April of 2006. She denies any alcohol or drugs. She has not been routinely exercising.    Family History  Problem Relation Age of Onset  . Stroke Mother   . Alcohol abuse Father   . Hypertension Father   . Stroke Father   . Hyperlipidemia Father   . Stroke Brother     she has three and at least one of them has had a stroke    Current Outpatient Prescriptions  Medication Sig Dispense Refill  . Calcium Carbonate-Vitamin D (CALCIUM 600+D) 600-400 MG-UNIT per tablet Take 1 tablet by mouth 2 (two) times daily.      Marland Kitchen enoxaparin (LOVENOX) 60 MG/0.6ML injection Inject 60 mg into the skin every 12 (twelve) hours. To start on 07/17/2012      . furosemide (LASIX) 20 MG tablet Take 20 mg by mouth daily.      Marland Kitchen KLOR-CON M10 10 MEQ tablet       . omeprazole (PRILOSEC) 20 MG capsule Take 20 mg by mouth daily.        Marland Kitchen oxyCODONE (OXY IR/ROXICODONE) 5 MG immediate release tablet Take 5-10 mg by mouth every 6 (six) hours as needed. For pain      . potassium chloride (K-DUR) 10 MEQ tablet Take 1 tablet (10 mEq total) by mouth daily.  30 tablet  6  . simvastatin (ZOCOR) 40 MG tablet  Take 40 mg by mouth at bedtime.        . triazolam (HALCION) 0.25 MG tablet Take 0.25 mg by mouth at bedtime.      Marland Kitchen warfarin (COUMADIN) 3 MG tablet Take 1.5-3 mg by mouth See admin instructions. Takes 1.5mg  on Tuesday and Saturday and 3mg  on all other days         No Known Allergies  BP 130/82  Pulse 80  Temp 97.4 F (36.3 C) (Temporal)  Resp 18  Ht 5\' 2"  (1.575 m)  Wt 144 lb 6.4 oz (65.499 kg)  BMI 26.41  kg/m2  Dg Cholangiogram Operative  07/21/2012  *RADIOLOGY REPORT*  Intraoperative cholangiogram  History:  Gallbladder disease  Findings:  Gallbladder is been removed, and the cystic duct has been cannulated.  The portions of the intrahepatic biliary ductal system which are visualized appear normal.  Common hepatic and common bile ducts are visualized without mass or calculus seen. There is apparent free flow of contrast via the common bile duct into the duodenum.  Some retrograde filling of the pancreatic duct is noted.  Visualized portions of pancreatic duct appear normal.  Conclusion: No evidence of obstructing lesion.  No mass or calculus appreciated.   Original Report Authenticated By: Bretta Bang, M.D.      Review of Systems  Constitutional: Negative for fever, chills and diaphoresis.  HENT: Negative for ear pain, sore throat and trouble swallowing.   Eyes: Negative for photophobia and visual disturbance.  Respiratory: Negative for cough and choking.   Cardiovascular: Negative for chest pain and palpitations.  Gastrointestinal: Negative for nausea, vomiting, abdominal pain, diarrhea, constipation, anal bleeding and rectal pain.  Genitourinary: Negative for dysuria, frequency and difficulty urinating.  Musculoskeletal: Negative for myalgias and gait problem.  Skin: Negative for color change, pallor and rash.  Neurological: Negative for dizziness, speech difficulty, weakness and numbness.  Hematological: Negative for adenopathy.  Psychiatric/Behavioral: Negative for  confusion and agitation. The patient is not nervous/anxious.        Objective:   Physical Exam  Constitutional: She is oriented to person, place, and time. She appears well-developed and well-nourished. No distress.  HENT:  Head: Normocephalic.  Mouth/Throat: Oropharynx is clear and moist. No oropharyngeal exudate.  Eyes: Conjunctivae normal and EOM are normal. Pupils are equal, round, and reactive to light. No scleral icterus.  Neck: Normal range of motion. No tracheal deviation present.  Cardiovascular: Normal rate and intact distal pulses.   Pulmonary/Chest: Effort normal. No respiratory distress. She exhibits no tenderness.  Abdominal: Soft. She exhibits no distension. There is no tenderness. Hernia confirmed negative in the right inguinal area and confirmed negative in the left inguinal area.       Incisions clean with normal healing ridges.  No hernias  Genitourinary: No vaginal discharge found.  Musculoskeletal: Normal range of motion. She exhibits no tenderness.  Lymphadenopathy:       Right: No inguinal adenopathy present.       Left: No inguinal adenopathy present.  Neurological: She is alert and oriented to person, place, and time. No cranial nerve deficit. She exhibits normal muscle tone. Coordination normal.  Skin: Skin is warm and dry. No rash noted. She is not diaphoretic.  Psychiatric: She has a normal mood and affect. Her behavior is normal.       Assessment:     S/p lap chole for chronic cholecystitis    Plan:     Increase activity as tolerated to regular activity.  Do not push through pain.  Diet as tolerated. Bowel regimen to avoid problems.  Return to clinic p.r.n.   Instructions discussed.  Followup with primary care physician for other health issues as would normally be done.  Questions answered.  The patient expressed understanding and appreciation

## 2012-08-15 NOTE — Patient Instructions (Signed)
GETTING TO GOOD BOWEL HEALTH. Irregular bowel habits such as constipation and diarrhea can lead to many problems over time.  Having one soft bowel movement a day is the most important way to prevent further problems.  The anorectal canal is designed to handle stretching and feces to safely manage our ability to get rid of solid waste (feces, poop, stool) out of our body.  BUT, hard constipated stools can act like ripping concrete bricks and diarrhea can be a burning fire to this very sensitive area of our body, causing inflamed hemorrhoids, anal fissures, increasing risk is perirectal abscesses, abdominal pain/bloating, an making irritable bowel worse.     The goal: ONE SOFT BOWEL MOVEMENT A DAY!  To have soft, regular bowel movements:    Drink at least 8 tall glasses of water a day.     Take plenty of fiber.  Fiber is the undigested part of plant food that passes into the colon, acting s "natures broom" to encourage bowel motility and movement.  Fiber can absorb and hold large amounts of water. This results in a larger, bulkier stool, which is soft and easier to pass. Work gradually over several weeks up to 6 servings a day of fiber (25g a day even more if needed) in the form of: o Vegetables -- Root (potatoes, carrots, turnips), leafy green (lettuce, salad greens, celery, spinach), or cooked high residue (cabbage, broccoli, etc) o Fruit -- Fresh (unpeeled skin & pulp), Dried (prunes, apricots, cherries, etc ),  or stewed ( applesauce)  o Whole grain breads, pasta, etc (whole wheat)  o Bran cereals    Bulking Agents -- This type of water-retaining fiber generally is easily obtained each day by one of the following:  o Psyllium bran -- The psyllium plant is remarkable because its ground seeds can retain so much water. This product is available as Metamucil, Konsyl, Effersyllium, Per Diem Fiber, or the less expensive generic preparation in drug and health food stores. Although labeled a laxative, it really  is not a laxative.  o Methylcellulose -- This is another fiber derived from wood which also retains water. It is available as Citrucel. o Polyethylene Glycol - and "artificial" fiber commonly called Miralax or Glycolax.  It is helpful for people with gassy or bloated feelings with regular fiber o Flax Seed - a less gassy fiber than psyllium   No reading or other relaxing activity while on the toilet. If bowel movements take longer than 5 minutes, you are too constipated   AVOID CONSTIPATION.  High fiber and water intake usually takes care of this.  Sometimes a laxative is needed to stimulate more frequent bowel movements, but    Laxatives are not a good long-term solution as it can wear the colon out. o Osmotics (Milk of Magnesia, Fleets phosphosoda, Magnesium citrate, MiraLax, GoLytely) are safer than  o Stimulants (Senokot, Castor Oil, Dulcolax, Ex Lax)    o Do not take laxatives for more than 7days in a row.    IF SEVERELY CONSTIPATED, try a Bowel Retraining Program: o Do not use laxatives.  o Eat a diet high in roughage, such as bran cereals and leafy vegetables.  o Drink six (6) ounces of prune or apricot juice each morning.  o Eat two (2) large servings of stewed fruit each day.  o Take one (1) heaping tablespoon of a psyllium-based bulking agent twice a day. Use sugar-free sweetener when possible to avoid excessive calories.  o Eat a normal breakfast.  o   Set aside 15 minutes after breakfast to sit on the toilet, but do not strain to have a bowel movement.  o If you do not have a bowel movement by the third day, use an enema and repeat the above steps.    Controlling diarrhea o Switch to liquids and simpler foods for a few days to avoid stressing your intestines further. o Avoid dairy products (especially milk & ice cream) for a short time.  The intestines often can lose the ability to digest lactose when stressed. o Avoid foods that cause gassiness or bloating.  Typical foods include  beans and other legumes, cabbage, broccoli, and dairy foods.  Every person has some sensitivity to other foods, so listen to our body and avoid those foods that trigger problems for you. o Adding fiber (Citrucel, Metamucil, psyllium, Miralax) gradually can help thicken stools by absorbing excess fluid and retrain the intestines to act more normally.  Slowly increase the dose over a few weeks.  Too much fiber too soon can backfire and cause cramping & bloating. o Probiotics (such as active yogurt, Align, etc) may help repopulate the intestines and colon with normal bacteria and calm down a sensitive digestive tract.  Most studies show it to be of mild help, though, and such products can be costly. o Medicines:   Bismuth subsalicylate (ex. Kayopectate, Pepto Bismol) every 30 minutes for up to 6 doses can help control diarrhea.  Avoid if pregnant.   Loperamide (Immodium) can slow down diarrhea.  Start with two tablets (4mg total) first and then try one tablet every 6 hours.  Avoid if you are having fevers or severe pain.  If you are not better or start feeling worse, stop all medicines and call your doctor for advice o Call your doctor if you are getting worse or not better.  Sometimes further testing (cultures, endoscopy, X-ray studies, bloodwork, etc) may be needed to help diagnose and treat the cause of the diarrhea.   Managing Pain  Pain after surgery or related to activity is often due to strain/injury to muscle, tendon, nerves and/or incisions.  This pain is usually short-term and will improve in a few months.   Many people find it helpful to do the following things TOGETHER to help speed the process of healing and to get back to regular activity more quickly:  1. Avoid heavy physical activity a.  no lifting greater than 20 pounds b. Do not "push through" the pain.  Listen to your body and avoid positions and maneuvers than reproduce the pain c. Walking is okay as tolerated, but go slowly and  stop when getting sore.  d. Remember: If it hurts to do it, then don't do it! 2. Take Anti-inflammatory medication  a. Take with food/snack around the clock for 1-2 weeks i. This helps the muscle and nerve tissues become less irritable and calm down faster b. Choose ONE of the following over-the-counter medications: i. Naproxen 220mg tabs (ex. Aleve) 1-2 pills twice a day  ii. Ibuprofen 200mg tabs (ex. Advil, Motrin) 3-4 pills with every meal and just before bedtime iii. Acetaminophen 500mg tabs (Tylenol) 1-2 pills with every meal and just before bedtime 3. Use a Heating pad or Ice/Cold Pack a. 4-6 times a day b. May use warm bath/hottub  or showers 4. Try Gentle Massage and/or Stretching  a. at the area of pain many times a day b. stop if you feel pain - do not overdo it  Try these steps together to   help you body heal faster and avoid making things get worse.  Doing just one of these things may not be enough.    If you are not getting better after two weeks or are noticing you are getting worse, contact our office for further advice; we may need to re-evaluate you & see what other things we can do to help.   

## 2012-08-29 ENCOUNTER — Ambulatory Visit (INDEPENDENT_AMBULATORY_CARE_PROVIDER_SITE_OTHER): Payer: 59 | Admitting: *Deleted

## 2012-08-29 DIAGNOSIS — Z952 Presence of prosthetic heart valve: Secondary | ICD-10-CM

## 2012-08-29 DIAGNOSIS — I359 Nonrheumatic aortic valve disorder, unspecified: Secondary | ICD-10-CM

## 2012-08-29 DIAGNOSIS — Z954 Presence of other heart-valve replacement: Secondary | ICD-10-CM

## 2012-08-29 DIAGNOSIS — I639 Cerebral infarction, unspecified: Secondary | ICD-10-CM

## 2012-08-29 DIAGNOSIS — I6350 Cerebral infarction due to unspecified occlusion or stenosis of unspecified cerebral artery: Secondary | ICD-10-CM

## 2012-08-29 DIAGNOSIS — Z7901 Long term (current) use of anticoagulants: Secondary | ICD-10-CM

## 2012-09-29 ENCOUNTER — Ambulatory Visit (INDEPENDENT_AMBULATORY_CARE_PROVIDER_SITE_OTHER): Payer: 59 | Admitting: *Deleted

## 2012-09-29 DIAGNOSIS — I639 Cerebral infarction, unspecified: Secondary | ICD-10-CM

## 2012-09-29 DIAGNOSIS — Z954 Presence of other heart-valve replacement: Secondary | ICD-10-CM

## 2012-09-29 DIAGNOSIS — I6350 Cerebral infarction due to unspecified occlusion or stenosis of unspecified cerebral artery: Secondary | ICD-10-CM

## 2012-09-29 DIAGNOSIS — Z952 Presence of prosthetic heart valve: Secondary | ICD-10-CM

## 2012-09-29 DIAGNOSIS — Z7901 Long term (current) use of anticoagulants: Secondary | ICD-10-CM

## 2012-09-29 DIAGNOSIS — I359 Nonrheumatic aortic valve disorder, unspecified: Secondary | ICD-10-CM

## 2012-10-27 ENCOUNTER — Ambulatory Visit (INDEPENDENT_AMBULATORY_CARE_PROVIDER_SITE_OTHER): Payer: 59

## 2012-10-27 DIAGNOSIS — Z7901 Long term (current) use of anticoagulants: Secondary | ICD-10-CM

## 2012-10-27 DIAGNOSIS — Z952 Presence of prosthetic heart valve: Secondary | ICD-10-CM

## 2012-10-27 DIAGNOSIS — I639 Cerebral infarction, unspecified: Secondary | ICD-10-CM

## 2012-10-27 DIAGNOSIS — I6350 Cerebral infarction due to unspecified occlusion or stenosis of unspecified cerebral artery: Secondary | ICD-10-CM

## 2012-10-27 DIAGNOSIS — Z954 Presence of other heart-valve replacement: Secondary | ICD-10-CM

## 2012-10-27 DIAGNOSIS — I359 Nonrheumatic aortic valve disorder, unspecified: Secondary | ICD-10-CM

## 2012-12-08 ENCOUNTER — Ambulatory Visit (INDEPENDENT_AMBULATORY_CARE_PROVIDER_SITE_OTHER): Payer: 59

## 2012-12-08 DIAGNOSIS — Z954 Presence of other heart-valve replacement: Secondary | ICD-10-CM

## 2012-12-08 DIAGNOSIS — I359 Nonrheumatic aortic valve disorder, unspecified: Secondary | ICD-10-CM

## 2012-12-08 DIAGNOSIS — I6350 Cerebral infarction due to unspecified occlusion or stenosis of unspecified cerebral artery: Secondary | ICD-10-CM

## 2012-12-08 DIAGNOSIS — I639 Cerebral infarction, unspecified: Secondary | ICD-10-CM

## 2012-12-08 DIAGNOSIS — Z7901 Long term (current) use of anticoagulants: Secondary | ICD-10-CM

## 2012-12-08 DIAGNOSIS — Z952 Presence of prosthetic heart valve: Secondary | ICD-10-CM

## 2012-12-26 ENCOUNTER — Other Ambulatory Visit: Payer: Self-pay | Admitting: Cardiology

## 2013-01-05 ENCOUNTER — Other Ambulatory Visit: Payer: Self-pay | Admitting: Cardiology

## 2013-01-06 NOTE — Telephone Encounter (Signed)
Klor Con given to pt in hospital with 6 remaining refills in (Feb) so should be good until August

## 2013-01-09 ENCOUNTER — Other Ambulatory Visit: Payer: Self-pay | Admitting: Cardiology

## 2013-01-12 ENCOUNTER — Other Ambulatory Visit: Payer: Self-pay

## 2013-01-12 MED ORDER — POTASSIUM CHLORIDE ER 10 MEQ PO TBCR: 10.0000 meq | EXTENDED_RELEASE_TABLET | Freq: Every day | ORAL | Status: DC

## 2013-01-19 ENCOUNTER — Ambulatory Visit (INDEPENDENT_AMBULATORY_CARE_PROVIDER_SITE_OTHER): Payer: 59

## 2013-01-19 DIAGNOSIS — I639 Cerebral infarction, unspecified: Secondary | ICD-10-CM

## 2013-01-19 DIAGNOSIS — Z954 Presence of other heart-valve replacement: Secondary | ICD-10-CM

## 2013-01-19 DIAGNOSIS — I359 Nonrheumatic aortic valve disorder, unspecified: Secondary | ICD-10-CM

## 2013-01-19 DIAGNOSIS — Z952 Presence of prosthetic heart valve: Secondary | ICD-10-CM

## 2013-01-19 DIAGNOSIS — I6350 Cerebral infarction due to unspecified occlusion or stenosis of unspecified cerebral artery: Secondary | ICD-10-CM

## 2013-01-19 DIAGNOSIS — Z7901 Long term (current) use of anticoagulants: Secondary | ICD-10-CM

## 2013-01-19 LAB — POCT INR: INR: 1.7

## 2013-02-15 ENCOUNTER — Ambulatory Visit (INDEPENDENT_AMBULATORY_CARE_PROVIDER_SITE_OTHER): Payer: 59 | Admitting: *Deleted

## 2013-02-15 DIAGNOSIS — I639 Cerebral infarction, unspecified: Secondary | ICD-10-CM

## 2013-02-15 DIAGNOSIS — I359 Nonrheumatic aortic valve disorder, unspecified: Secondary | ICD-10-CM

## 2013-02-15 DIAGNOSIS — I6350 Cerebral infarction due to unspecified occlusion or stenosis of unspecified cerebral artery: Secondary | ICD-10-CM

## 2013-02-15 DIAGNOSIS — Z954 Presence of other heart-valve replacement: Secondary | ICD-10-CM

## 2013-02-15 DIAGNOSIS — Z952 Presence of prosthetic heart valve: Secondary | ICD-10-CM

## 2013-02-15 DIAGNOSIS — Z7901 Long term (current) use of anticoagulants: Secondary | ICD-10-CM

## 2013-02-15 LAB — POCT INR: INR: 2.4

## 2013-03-09 ENCOUNTER — Other Ambulatory Visit: Payer: Self-pay | Admitting: Cardiology

## 2013-03-29 ENCOUNTER — Ambulatory Visit (INDEPENDENT_AMBULATORY_CARE_PROVIDER_SITE_OTHER): Payer: 59 | Admitting: Pharmacist

## 2013-03-29 DIAGNOSIS — I6350 Cerebral infarction due to unspecified occlusion or stenosis of unspecified cerebral artery: Secondary | ICD-10-CM

## 2013-03-29 DIAGNOSIS — Z7901 Long term (current) use of anticoagulants: Secondary | ICD-10-CM

## 2013-03-29 DIAGNOSIS — I359 Nonrheumatic aortic valve disorder, unspecified: Secondary | ICD-10-CM

## 2013-03-29 DIAGNOSIS — I639 Cerebral infarction, unspecified: Secondary | ICD-10-CM

## 2013-03-29 DIAGNOSIS — Z954 Presence of other heart-valve replacement: Secondary | ICD-10-CM

## 2013-03-29 DIAGNOSIS — Z952 Presence of prosthetic heart valve: Secondary | ICD-10-CM

## 2013-04-04 ENCOUNTER — Other Ambulatory Visit: Payer: Self-pay | Admitting: Internal Medicine

## 2013-04-04 DIAGNOSIS — Z1231 Encounter for screening mammogram for malignant neoplasm of breast: Secondary | ICD-10-CM

## 2013-04-04 DIAGNOSIS — M8580 Other specified disorders of bone density and structure, unspecified site: Secondary | ICD-10-CM

## 2013-04-26 ENCOUNTER — Ambulatory Visit (INDEPENDENT_AMBULATORY_CARE_PROVIDER_SITE_OTHER): Payer: 59 | Admitting: *Deleted

## 2013-04-26 ENCOUNTER — Encounter (INDEPENDENT_AMBULATORY_CARE_PROVIDER_SITE_OTHER): Payer: Self-pay

## 2013-04-26 DIAGNOSIS — I359 Nonrheumatic aortic valve disorder, unspecified: Secondary | ICD-10-CM

## 2013-04-26 DIAGNOSIS — I639 Cerebral infarction, unspecified: Secondary | ICD-10-CM

## 2013-04-26 DIAGNOSIS — Z7901 Long term (current) use of anticoagulants: Secondary | ICD-10-CM

## 2013-04-26 DIAGNOSIS — Z954 Presence of other heart-valve replacement: Secondary | ICD-10-CM

## 2013-04-26 DIAGNOSIS — Z952 Presence of prosthetic heart valve: Secondary | ICD-10-CM

## 2013-04-26 DIAGNOSIS — I6350 Cerebral infarction due to unspecified occlusion or stenosis of unspecified cerebral artery: Secondary | ICD-10-CM

## 2013-04-26 LAB — POCT INR: INR: 2.4

## 2013-05-17 ENCOUNTER — Ambulatory Visit
Admission: RE | Admit: 2013-05-17 | Discharge: 2013-05-17 | Disposition: A | Discharge status: Home / Self Care | Payer: 59 | Source: Ambulatory Visit | Attending: Internal Medicine | Admitting: Internal Medicine

## 2013-05-17 DIAGNOSIS — Z1231 Encounter for screening mammogram for malignant neoplasm of breast: Secondary | ICD-10-CM

## 2013-05-17 DIAGNOSIS — M8580 Other specified disorders of bone density and structure, unspecified site: Secondary | ICD-10-CM

## 2013-05-26 ENCOUNTER — Ambulatory Visit: Payer: 59 | Admitting: Cardiology

## 2013-05-27 ENCOUNTER — Other Ambulatory Visit: Payer: Self-pay | Admitting: Cardiology

## 2013-06-07 ENCOUNTER — Other Ambulatory Visit: Payer: Self-pay | Admitting: Cardiology

## 2013-06-07 ENCOUNTER — Ambulatory Visit (INDEPENDENT_AMBULATORY_CARE_PROVIDER_SITE_OTHER): Payer: 59 | Admitting: *Deleted

## 2013-06-07 DIAGNOSIS — Z7901 Long term (current) use of anticoagulants: Secondary | ICD-10-CM

## 2013-06-07 DIAGNOSIS — I359 Nonrheumatic aortic valve disorder, unspecified: Secondary | ICD-10-CM

## 2013-06-07 DIAGNOSIS — Z952 Presence of prosthetic heart valve: Secondary | ICD-10-CM

## 2013-06-07 DIAGNOSIS — I639 Cerebral infarction, unspecified: Secondary | ICD-10-CM

## 2013-06-07 DIAGNOSIS — Z954 Presence of other heart-valve replacement: Secondary | ICD-10-CM

## 2013-06-07 DIAGNOSIS — I6350 Cerebral infarction due to unspecified occlusion or stenosis of unspecified cerebral artery: Secondary | ICD-10-CM

## 2013-06-09 ENCOUNTER — Other Ambulatory Visit: Payer: Self-pay | Admitting: Cardiology

## 2013-07-12 ENCOUNTER — Other Ambulatory Visit: Payer: Self-pay

## 2013-07-12 MED ORDER — POTASSIUM CHLORIDE CRYS ER 10 MEQ PO TBCR: 10.0000 meq | EXTENDED_RELEASE_TABLET | Freq: Once | ORAL | Status: DC

## 2013-07-14 ENCOUNTER — Other Ambulatory Visit: Payer: Self-pay

## 2013-07-14 MED ORDER — SIMVASTATIN 40 MG PO TABS: 40.0000 mg | ORAL_TABLET | Freq: Every day | ORAL | Status: DC

## 2013-07-19 ENCOUNTER — Ambulatory Visit (INDEPENDENT_AMBULATORY_CARE_PROVIDER_SITE_OTHER): Payer: 59 | Admitting: *Deleted

## 2013-07-19 DIAGNOSIS — I639 Cerebral infarction, unspecified: Secondary | ICD-10-CM

## 2013-07-19 DIAGNOSIS — Z952 Presence of prosthetic heart valve: Secondary | ICD-10-CM

## 2013-07-19 DIAGNOSIS — Z7901 Long term (current) use of anticoagulants: Secondary | ICD-10-CM

## 2013-07-19 DIAGNOSIS — I6350 Cerebral infarction due to unspecified occlusion or stenosis of unspecified cerebral artery: Secondary | ICD-10-CM

## 2013-07-19 DIAGNOSIS — I359 Nonrheumatic aortic valve disorder, unspecified: Secondary | ICD-10-CM

## 2013-07-19 DIAGNOSIS — Z954 Presence of other heart-valve replacement: Secondary | ICD-10-CM

## 2013-07-19 LAB — POCT INR: INR: 3.1

## 2013-09-15 ENCOUNTER — Ambulatory Visit (INDEPENDENT_AMBULATORY_CARE_PROVIDER_SITE_OTHER): Payer: 59 | Admitting: *Deleted

## 2013-09-15 DIAGNOSIS — I359 Nonrheumatic aortic valve disorder, unspecified: Secondary | ICD-10-CM

## 2013-09-15 DIAGNOSIS — I639 Cerebral infarction, unspecified: Secondary | ICD-10-CM

## 2013-09-15 DIAGNOSIS — Z954 Presence of other heart-valve replacement: Secondary | ICD-10-CM

## 2013-09-15 DIAGNOSIS — I6350 Cerebral infarction due to unspecified occlusion or stenosis of unspecified cerebral artery: Secondary | ICD-10-CM

## 2013-09-15 DIAGNOSIS — Z7901 Long term (current) use of anticoagulants: Secondary | ICD-10-CM

## 2013-09-15 DIAGNOSIS — Z952 Presence of prosthetic heart valve: Secondary | ICD-10-CM

## 2013-09-15 LAB — POCT INR: INR: 3.3

## 2013-10-06 ENCOUNTER — Ambulatory Visit (INDEPENDENT_AMBULATORY_CARE_PROVIDER_SITE_OTHER): Payer: 59 | Admitting: Pharmacist

## 2013-10-06 DIAGNOSIS — I639 Cerebral infarction, unspecified: Secondary | ICD-10-CM

## 2013-10-06 DIAGNOSIS — Z954 Presence of other heart-valve replacement: Secondary | ICD-10-CM

## 2013-10-06 DIAGNOSIS — Z952 Presence of prosthetic heart valve: Secondary | ICD-10-CM

## 2013-10-06 DIAGNOSIS — Z7901 Long term (current) use of anticoagulants: Secondary | ICD-10-CM

## 2013-10-06 DIAGNOSIS — I359 Nonrheumatic aortic valve disorder, unspecified: Secondary | ICD-10-CM

## 2013-10-06 DIAGNOSIS — I6350 Cerebral infarction due to unspecified occlusion or stenosis of unspecified cerebral artery: Secondary | ICD-10-CM

## 2013-10-06 LAB — POCT INR: INR: 2.6

## 2013-11-02 ENCOUNTER — Other Ambulatory Visit: Payer: Self-pay | Admitting: *Deleted

## 2013-11-02 MED ORDER — WARFARIN SODIUM 3 MG PO TABS: ORAL_TABLET | ORAL | Status: DC

## 2013-11-17 ENCOUNTER — Ambulatory Visit (INDEPENDENT_AMBULATORY_CARE_PROVIDER_SITE_OTHER): Payer: 59 | Admitting: *Deleted

## 2013-11-17 DIAGNOSIS — I639 Cerebral infarction, unspecified: Secondary | ICD-10-CM

## 2013-11-17 DIAGNOSIS — I359 Nonrheumatic aortic valve disorder, unspecified: Secondary | ICD-10-CM

## 2013-11-17 DIAGNOSIS — I6350 Cerebral infarction due to unspecified occlusion or stenosis of unspecified cerebral artery: Secondary | ICD-10-CM

## 2013-11-17 DIAGNOSIS — Z7901 Long term (current) use of anticoagulants: Secondary | ICD-10-CM

## 2013-11-17 DIAGNOSIS — Z954 Presence of other heart-valve replacement: Secondary | ICD-10-CM

## 2013-11-17 DIAGNOSIS — Z952 Presence of prosthetic heart valve: Secondary | ICD-10-CM

## 2013-11-17 LAB — POCT INR: INR: 3.8

## 2013-12-11 ENCOUNTER — Other Ambulatory Visit: Payer: Self-pay | Admitting: *Deleted

## 2013-12-11 MED ORDER — WARFARIN SODIUM 3 MG PO TABS: ORAL_TABLET | ORAL | Status: DC

## 2013-12-15 ENCOUNTER — Ambulatory Visit (INDEPENDENT_AMBULATORY_CARE_PROVIDER_SITE_OTHER): Payer: 59

## 2013-12-15 DIAGNOSIS — Z954 Presence of other heart-valve replacement: Secondary | ICD-10-CM

## 2013-12-15 DIAGNOSIS — I6350 Cerebral infarction due to unspecified occlusion or stenosis of unspecified cerebral artery: Secondary | ICD-10-CM

## 2013-12-15 DIAGNOSIS — I359 Nonrheumatic aortic valve disorder, unspecified: Secondary | ICD-10-CM

## 2013-12-15 DIAGNOSIS — Z7901 Long term (current) use of anticoagulants: Secondary | ICD-10-CM

## 2013-12-15 DIAGNOSIS — I639 Cerebral infarction, unspecified: Secondary | ICD-10-CM

## 2013-12-15 DIAGNOSIS — Z952 Presence of prosthetic heart valve: Secondary | ICD-10-CM

## 2013-12-15 LAB — POCT INR: INR: 3.5

## 2014-01-05 ENCOUNTER — Ambulatory Visit (INDEPENDENT_AMBULATORY_CARE_PROVIDER_SITE_OTHER): Payer: 59 | Admitting: Pharmacist

## 2014-01-05 DIAGNOSIS — I6350 Cerebral infarction due to unspecified occlusion or stenosis of unspecified cerebral artery: Secondary | ICD-10-CM

## 2014-01-05 DIAGNOSIS — Z952 Presence of prosthetic heart valve: Secondary | ICD-10-CM

## 2014-01-05 DIAGNOSIS — Z954 Presence of other heart-valve replacement: Secondary | ICD-10-CM

## 2014-01-05 DIAGNOSIS — I359 Nonrheumatic aortic valve disorder, unspecified: Secondary | ICD-10-CM

## 2014-01-05 DIAGNOSIS — Z7901 Long term (current) use of anticoagulants: Secondary | ICD-10-CM

## 2014-01-05 DIAGNOSIS — I639 Cerebral infarction, unspecified: Secondary | ICD-10-CM

## 2014-01-05 LAB — POCT INR: INR: 2.6

## 2014-02-02 ENCOUNTER — Ambulatory Visit (INDEPENDENT_AMBULATORY_CARE_PROVIDER_SITE_OTHER): Payer: 59

## 2014-02-02 DIAGNOSIS — I6350 Cerebral infarction due to unspecified occlusion or stenosis of unspecified cerebral artery: Secondary | ICD-10-CM

## 2014-02-02 DIAGNOSIS — I359 Nonrheumatic aortic valve disorder, unspecified: Secondary | ICD-10-CM

## 2014-02-02 DIAGNOSIS — Z7901 Long term (current) use of anticoagulants: Secondary | ICD-10-CM

## 2014-02-02 DIAGNOSIS — Z954 Presence of other heart-valve replacement: Secondary | ICD-10-CM

## 2014-02-02 DIAGNOSIS — Z952 Presence of prosthetic heart valve: Secondary | ICD-10-CM

## 2014-02-02 DIAGNOSIS — I639 Cerebral infarction, unspecified: Secondary | ICD-10-CM

## 2014-02-02 LAB — POCT INR: INR: 3.4

## 2014-02-23 ENCOUNTER — Ambulatory Visit (INDEPENDENT_AMBULATORY_CARE_PROVIDER_SITE_OTHER): Payer: 59 | Admitting: Pharmacist

## 2014-02-23 DIAGNOSIS — I6350 Cerebral infarction due to unspecified occlusion or stenosis of unspecified cerebral artery: Secondary | ICD-10-CM

## 2014-02-23 DIAGNOSIS — Z952 Presence of prosthetic heart valve: Secondary | ICD-10-CM

## 2014-02-23 DIAGNOSIS — Z954 Presence of other heart-valve replacement: Secondary | ICD-10-CM

## 2014-02-23 DIAGNOSIS — I359 Nonrheumatic aortic valve disorder, unspecified: Secondary | ICD-10-CM

## 2014-02-23 DIAGNOSIS — I639 Cerebral infarction, unspecified: Secondary | ICD-10-CM

## 2014-02-23 DIAGNOSIS — Z7901 Long term (current) use of anticoagulants: Secondary | ICD-10-CM

## 2014-02-23 LAB — POCT INR: INR: 2.6

## 2014-03-02 ENCOUNTER — Other Ambulatory Visit: Payer: Self-pay | Admitting: Cardiology

## 2014-03-26 ENCOUNTER — Ambulatory Visit (INDEPENDENT_AMBULATORY_CARE_PROVIDER_SITE_OTHER): Payer: 59

## 2014-03-26 DIAGNOSIS — Z7901 Long term (current) use of anticoagulants: Secondary | ICD-10-CM

## 2014-03-26 DIAGNOSIS — Z954 Presence of other heart-valve replacement: Secondary | ICD-10-CM

## 2014-03-26 DIAGNOSIS — Z952 Presence of prosthetic heart valve: Secondary | ICD-10-CM

## 2014-03-26 DIAGNOSIS — I6350 Cerebral infarction due to unspecified occlusion or stenosis of unspecified cerebral artery: Secondary | ICD-10-CM

## 2014-03-26 DIAGNOSIS — I639 Cerebral infarction, unspecified: Secondary | ICD-10-CM

## 2014-03-26 DIAGNOSIS — I359 Nonrheumatic aortic valve disorder, unspecified: Secondary | ICD-10-CM

## 2014-03-26 LAB — POCT INR: INR: 3.3

## 2014-04-01 ENCOUNTER — Other Ambulatory Visit: Payer: Self-pay | Admitting: Cardiology

## 2014-04-17 ENCOUNTER — Other Ambulatory Visit: Payer: Self-pay

## 2014-04-17 DIAGNOSIS — Z1239 Encounter for other screening for malignant neoplasm of breast: Secondary | ICD-10-CM

## 2014-04-20 ENCOUNTER — Ambulatory Visit (INDEPENDENT_AMBULATORY_CARE_PROVIDER_SITE_OTHER): Payer: 59 | Admitting: *Deleted

## 2014-04-20 DIAGNOSIS — I6350 Cerebral infarction due to unspecified occlusion or stenosis of unspecified cerebral artery: Secondary | ICD-10-CM

## 2014-04-20 DIAGNOSIS — I639 Cerebral infarction, unspecified: Secondary | ICD-10-CM

## 2014-04-20 DIAGNOSIS — Z952 Presence of prosthetic heart valve: Secondary | ICD-10-CM

## 2014-04-20 DIAGNOSIS — Z954 Presence of other heart-valve replacement: Secondary | ICD-10-CM

## 2014-04-20 DIAGNOSIS — I359 Nonrheumatic aortic valve disorder, unspecified: Secondary | ICD-10-CM

## 2014-04-20 DIAGNOSIS — Z7901 Long term (current) use of anticoagulants: Secondary | ICD-10-CM

## 2014-04-20 LAB — POCT INR: INR: 3.4

## 2014-05-01 ENCOUNTER — Other Ambulatory Visit: Payer: Self-pay | Admitting: Cardiology

## 2014-05-11 ENCOUNTER — Ambulatory Visit (INDEPENDENT_AMBULATORY_CARE_PROVIDER_SITE_OTHER): Payer: 59 | Admitting: Pharmacist

## 2014-05-11 DIAGNOSIS — Z954 Presence of other heart-valve replacement: Secondary | ICD-10-CM

## 2014-05-11 DIAGNOSIS — Z952 Presence of prosthetic heart valve: Secondary | ICD-10-CM

## 2014-05-11 DIAGNOSIS — I639 Cerebral infarction, unspecified: Secondary | ICD-10-CM

## 2014-05-11 DIAGNOSIS — Z7901 Long term (current) use of anticoagulants: Secondary | ICD-10-CM

## 2014-05-11 DIAGNOSIS — I359 Nonrheumatic aortic valve disorder, unspecified: Secondary | ICD-10-CM

## 2014-05-11 DIAGNOSIS — I6350 Cerebral infarction due to unspecified occlusion or stenosis of unspecified cerebral artery: Secondary | ICD-10-CM

## 2014-05-11 LAB — POCT INR: INR: 2.8

## 2014-05-18 ENCOUNTER — Ambulatory Visit: Payer: Self-pay

## 2014-05-21 ENCOUNTER — Other Ambulatory Visit: Payer: Self-pay | Admitting: Cardiology

## 2014-05-22 NOTE — Telephone Encounter (Signed)
Please advise on refill. Patient has still failed to schedule an appointment. Thanks, MI

## 2014-05-25 ENCOUNTER — Other Ambulatory Visit: Payer: Self-pay

## 2014-05-25 DIAGNOSIS — Z1231 Encounter for screening mammogram for malignant neoplasm of breast: Secondary | ICD-10-CM

## 2014-05-29 ENCOUNTER — Ambulatory Visit
Admission: RE | Admit: 2014-05-29 | Discharge: 2014-05-29 | Disposition: A | Discharge status: Home / Self Care | Payer: 59 | Source: Ambulatory Visit

## 2014-05-29 DIAGNOSIS — Z1231 Encounter for screening mammogram for malignant neoplasm of breast: Secondary | ICD-10-CM

## 2014-06-01 ENCOUNTER — Ambulatory Visit (INDEPENDENT_AMBULATORY_CARE_PROVIDER_SITE_OTHER): Payer: 59

## 2014-06-01 DIAGNOSIS — I359 Nonrheumatic aortic valve disorder, unspecified: Secondary | ICD-10-CM

## 2014-06-01 DIAGNOSIS — I6350 Cerebral infarction due to unspecified occlusion or stenosis of unspecified cerebral artery: Secondary | ICD-10-CM

## 2014-06-01 DIAGNOSIS — Z7901 Long term (current) use of anticoagulants: Secondary | ICD-10-CM

## 2014-06-01 DIAGNOSIS — Z954 Presence of other heart-valve replacement: Secondary | ICD-10-CM

## 2014-06-01 DIAGNOSIS — I639 Cerebral infarction, unspecified: Secondary | ICD-10-CM

## 2014-06-01 DIAGNOSIS — Z952 Presence of prosthetic heart valve: Secondary | ICD-10-CM

## 2014-06-01 LAB — POCT INR: INR: 2

## 2014-07-04 ENCOUNTER — Ambulatory Visit (INDEPENDENT_AMBULATORY_CARE_PROVIDER_SITE_OTHER): Payer: 59 | Admitting: *Deleted

## 2014-07-04 DIAGNOSIS — I359 Nonrheumatic aortic valve disorder, unspecified: Secondary | ICD-10-CM

## 2014-07-04 DIAGNOSIS — I639 Cerebral infarction, unspecified: Secondary | ICD-10-CM

## 2014-07-04 DIAGNOSIS — I6350 Cerebral infarction due to unspecified occlusion or stenosis of unspecified cerebral artery: Secondary | ICD-10-CM

## 2014-07-04 DIAGNOSIS — Z952 Presence of prosthetic heart valve: Secondary | ICD-10-CM

## 2014-07-04 DIAGNOSIS — Z7901 Long term (current) use of anticoagulants: Secondary | ICD-10-CM

## 2014-07-04 DIAGNOSIS — Z954 Presence of other heart-valve replacement: Secondary | ICD-10-CM

## 2014-07-04 LAB — POCT INR: INR: 2.2

## 2014-07-10 ENCOUNTER — Other Ambulatory Visit: Payer: Self-pay | Admitting: Cardiology

## 2014-08-15 ENCOUNTER — Ambulatory Visit (INDEPENDENT_AMBULATORY_CARE_PROVIDER_SITE_OTHER): Payer: 59 | Admitting: *Deleted

## 2014-08-15 DIAGNOSIS — I6350 Cerebral infarction due to unspecified occlusion or stenosis of unspecified cerebral artery: Secondary | ICD-10-CM

## 2014-08-15 DIAGNOSIS — Z7901 Long term (current) use of anticoagulants: Secondary | ICD-10-CM

## 2014-08-15 DIAGNOSIS — Z952 Presence of prosthetic heart valve: Secondary | ICD-10-CM

## 2014-08-15 DIAGNOSIS — I639 Cerebral infarction, unspecified: Secondary | ICD-10-CM

## 2014-08-15 DIAGNOSIS — Z954 Presence of other heart-valve replacement: Secondary | ICD-10-CM

## 2014-08-15 DIAGNOSIS — I359 Nonrheumatic aortic valve disorder, unspecified: Secondary | ICD-10-CM

## 2014-08-15 LAB — POCT INR: INR: 2.2

## 2014-09-26 ENCOUNTER — Ambulatory Visit (INDEPENDENT_AMBULATORY_CARE_PROVIDER_SITE_OTHER): Payer: 59 | Admitting: *Deleted

## 2014-09-26 DIAGNOSIS — I359 Nonrheumatic aortic valve disorder, unspecified: Secondary | ICD-10-CM

## 2014-09-26 DIAGNOSIS — I6350 Cerebral infarction due to unspecified occlusion or stenosis of unspecified cerebral artery: Secondary | ICD-10-CM

## 2014-09-26 DIAGNOSIS — Z954 Presence of other heart-valve replacement: Secondary | ICD-10-CM

## 2014-09-26 DIAGNOSIS — Z952 Presence of prosthetic heart valve: Secondary | ICD-10-CM

## 2014-09-26 DIAGNOSIS — Z7901 Long term (current) use of anticoagulants: Secondary | ICD-10-CM

## 2014-09-26 DIAGNOSIS — I639 Cerebral infarction, unspecified: Secondary | ICD-10-CM

## 2014-09-26 LAB — POCT INR: INR: 2

## 2014-11-21 ENCOUNTER — Ambulatory Visit (INDEPENDENT_AMBULATORY_CARE_PROVIDER_SITE_OTHER): Payer: 59 | Admitting: Pharmacist

## 2014-11-21 DIAGNOSIS — I359 Nonrheumatic aortic valve disorder, unspecified: Secondary | ICD-10-CM | POA: Diagnosis not present

## 2014-11-21 DIAGNOSIS — Z952 Presence of prosthetic heart valve: Secondary | ICD-10-CM

## 2014-11-21 DIAGNOSIS — I639 Cerebral infarction, unspecified: Secondary | ICD-10-CM

## 2014-11-21 DIAGNOSIS — Z954 Presence of other heart-valve replacement: Secondary | ICD-10-CM

## 2014-11-21 DIAGNOSIS — I6350 Cerebral infarction due to unspecified occlusion or stenosis of unspecified cerebral artery: Secondary | ICD-10-CM

## 2014-11-21 DIAGNOSIS — Z7901 Long term (current) use of anticoagulants: Secondary | ICD-10-CM | POA: Diagnosis not present

## 2014-11-21 LAB — POCT INR: INR: 1.8

## 2014-12-26 ENCOUNTER — Ambulatory Visit (INDEPENDENT_AMBULATORY_CARE_PROVIDER_SITE_OTHER): Payer: 59 | Admitting: *Deleted

## 2014-12-26 DIAGNOSIS — Z7901 Long term (current) use of anticoagulants: Secondary | ICD-10-CM | POA: Diagnosis not present

## 2014-12-26 DIAGNOSIS — Z954 Presence of other heart-valve replacement: Secondary | ICD-10-CM

## 2014-12-26 DIAGNOSIS — Z952 Presence of prosthetic heart valve: Secondary | ICD-10-CM

## 2014-12-26 DIAGNOSIS — I639 Cerebral infarction, unspecified: Secondary | ICD-10-CM | POA: Diagnosis not present

## 2014-12-26 DIAGNOSIS — I359 Nonrheumatic aortic valve disorder, unspecified: Secondary | ICD-10-CM | POA: Diagnosis not present

## 2014-12-26 DIAGNOSIS — I6350 Cerebral infarction due to unspecified occlusion or stenosis of unspecified cerebral artery: Secondary | ICD-10-CM

## 2014-12-26 LAB — POCT INR: INR: 2.5

## 2015-01-12 ENCOUNTER — Other Ambulatory Visit: Payer: Self-pay | Admitting: Cardiology

## 2015-02-06 ENCOUNTER — Ambulatory Visit (INDEPENDENT_AMBULATORY_CARE_PROVIDER_SITE_OTHER): Payer: Commercial Managed Care - HMO | Admitting: *Deleted

## 2015-02-06 DIAGNOSIS — I359 Nonrheumatic aortic valve disorder, unspecified: Secondary | ICD-10-CM | POA: Diagnosis not present

## 2015-02-06 DIAGNOSIS — I6350 Cerebral infarction due to unspecified occlusion or stenosis of unspecified cerebral artery: Secondary | ICD-10-CM

## 2015-02-06 DIAGNOSIS — Z952 Presence of prosthetic heart valve: Secondary | ICD-10-CM

## 2015-02-06 DIAGNOSIS — I639 Cerebral infarction, unspecified: Secondary | ICD-10-CM

## 2015-02-06 DIAGNOSIS — Z954 Presence of other heart-valve replacement: Secondary | ICD-10-CM | POA: Diagnosis not present

## 2015-02-06 DIAGNOSIS — Z7901 Long term (current) use of anticoagulants: Secondary | ICD-10-CM

## 2015-02-06 LAB — POCT INR: INR: 2

## 2015-02-18 ENCOUNTER — Other Ambulatory Visit (HOSPITAL_COMMUNITY): Payer: Self-pay | Admitting: Internal Medicine

## 2015-02-18 ENCOUNTER — Other Ambulatory Visit (HOSPITAL_COMMUNITY)
Admission: RE | Admit: 2015-02-18 | Discharge: 2015-02-18 | Disposition: A | Discharge status: Home / Self Care | Payer: Commercial Managed Care - HMO | Source: Ambulatory Visit | Attending: Internal Medicine | Admitting: Internal Medicine

## 2015-02-18 DIAGNOSIS — Z01419 Encounter for gynecological examination (general) (routine) without abnormal findings: Secondary | ICD-10-CM | POA: Diagnosis not present

## 2015-02-18 DIAGNOSIS — R011 Cardiac murmur, unspecified: Secondary | ICD-10-CM

## 2015-02-19 ENCOUNTER — Ambulatory Visit (HOSPITAL_COMMUNITY): Payer: Commercial Managed Care - HMO

## 2015-02-22 ENCOUNTER — Encounter: Payer: Self-pay | Admitting: Gastroenterology

## 2015-02-26 ENCOUNTER — Other Ambulatory Visit: Payer: Self-pay

## 2015-02-26 ENCOUNTER — Ambulatory Visit (HOSPITAL_COMMUNITY): Payer: Commercial Managed Care - HMO | Attending: Internal Medicine

## 2015-02-26 DIAGNOSIS — R011 Cardiac murmur, unspecified: Secondary | ICD-10-CM | POA: Diagnosis not present

## 2015-02-26 DIAGNOSIS — I517 Cardiomegaly: Secondary | ICD-10-CM | POA: Diagnosis not present

## 2015-02-26 DIAGNOSIS — I10 Essential (primary) hypertension: Secondary | ICD-10-CM | POA: Diagnosis not present

## 2015-02-26 DIAGNOSIS — I340 Nonrheumatic mitral (valve) insufficiency: Secondary | ICD-10-CM | POA: Diagnosis not present

## 2015-02-26 DIAGNOSIS — E785 Hyperlipidemia, unspecified: Secondary | ICD-10-CM | POA: Insufficient documentation

## 2015-02-26 DIAGNOSIS — Z952 Presence of prosthetic heart valve: Secondary | ICD-10-CM | POA: Insufficient documentation

## 2015-02-26 DIAGNOSIS — I313 Pericardial effusion (noninflammatory): Secondary | ICD-10-CM | POA: Insufficient documentation

## 2015-03-20 ENCOUNTER — Ambulatory Visit (INDEPENDENT_AMBULATORY_CARE_PROVIDER_SITE_OTHER): Payer: Commercial Managed Care - HMO | Admitting: *Deleted

## 2015-03-20 DIAGNOSIS — I6350 Cerebral infarction due to unspecified occlusion or stenosis of unspecified cerebral artery: Secondary | ICD-10-CM

## 2015-03-20 DIAGNOSIS — Z7901 Long term (current) use of anticoagulants: Secondary | ICD-10-CM

## 2015-03-20 DIAGNOSIS — I359 Nonrheumatic aortic valve disorder, unspecified: Secondary | ICD-10-CM | POA: Diagnosis not present

## 2015-03-20 DIAGNOSIS — Z954 Presence of other heart-valve replacement: Secondary | ICD-10-CM

## 2015-03-20 DIAGNOSIS — I639 Cerebral infarction, unspecified: Secondary | ICD-10-CM | POA: Diagnosis not present

## 2015-03-20 DIAGNOSIS — Z952 Presence of prosthetic heart valve: Secondary | ICD-10-CM

## 2015-03-20 LAB — POCT INR: INR: 1.9

## 2015-04-18 ENCOUNTER — Telehealth: Payer: Self-pay

## 2015-04-18 ENCOUNTER — Encounter: Payer: Self-pay | Admitting: Gastroenterology

## 2015-04-18 ENCOUNTER — Ambulatory Visit (INDEPENDENT_AMBULATORY_CARE_PROVIDER_SITE_OTHER): Payer: Commercial Managed Care - HMO | Admitting: Gastroenterology

## 2015-04-18 VITALS — BP 144/70 | HR 84 | Ht 61.5 in | Wt 162.1 lb

## 2015-04-18 DIAGNOSIS — Z7901 Long term (current) use of anticoagulants: Secondary | ICD-10-CM | POA: Diagnosis not present

## 2015-04-18 DIAGNOSIS — Z954 Presence of other heart-valve replacement: Secondary | ICD-10-CM

## 2015-04-18 DIAGNOSIS — Z952 Presence of prosthetic heart valve: Secondary | ICD-10-CM

## 2015-04-18 DIAGNOSIS — D509 Iron deficiency anemia, unspecified: Secondary | ICD-10-CM | POA: Diagnosis not present

## 2015-04-18 MED ORDER — FERROUS SULFATE 325 (65 FE) MG PO TABS: 325.0000 mg | ORAL_TABLET | Freq: Every day | ORAL | Status: DC

## 2015-04-18 MED ORDER — POLYETHYLENE GLYCOL 3350 17 GM/SCOOP PO POWD: ORAL | Status: DC

## 2015-04-18 NOTE — Progress Notes (Signed)
HPI :  59 y/o female seen in consultation for iron deficiency anemia, new patient to this clinic. Labs reviewed from 02/18/2015 (not listed in Epic, faxed from [facility]) showed Hgb of 11.5, MCV 73, with low iron levels and low iron sat.  She is no longer menstruating and is postmenopausal. She denies any blood in the stools. She has some irregular stools, does not have a BM daily, passes hard balls of stools and endorses some constipation in general.  She thinks it has been ongoing for a while since her gallbladder was removed, in 2015 time frame. No abdominal pains, but can have some bloating and "knot" in her stomach at times, in the mid abdomen. She is eating okay. No nausea or vomiting. She has no difficulty swallowing. No weight loss, she is gaining weight.   She has been on coumadin since 2006. She had a CVA, and subsequently had aortic heart valve replaced  (mechanical St. Jude valve) as well as coronary artery bypass surgery in Sept 2006. Followed by cardiology also has switched her cardiology care to Dr. Meda Coffee recently due to retirement of her former cardiologist. She has been on lovenox bridge in the past when holding her coumadin for other procedures.   She has never had a prior colonoscopy. She thinks she may have had a prior endoscopy, but sounds like TEE, she does not know the details. She is a prior tobacco user but quit in 2006. No FH of CRC.   Past Medical History  Diagnosis Date  . Warfarin anticoagulation     coumadin therapy  . Hypertension   . Hx of CABG     2006,LIMA to LAD, SVG to diagonal  . 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 Dyslipidemia   . CAD (coronary artery disease)   . Heart valve replaced by other means 03/2005    bentall procedure.#21 st.jude mechanical vavle conduit with re- implantation of coronaries. good valve function ..echo 10/09,mechanical valve working well.. echo 10/11.  . S/P aortic valve replacement     Bental  procedure,#21 St. Jude mechanical valve conduit with reimplantation of the coronaries 2006 / valve working well, echo, October, 2009 / valve working well echo, October, 2011  . Indigestion 10/11  . Aortic stenosis     Aortic valve replacement 2006  . Ejection fraction     60%, echo, 2009 / 55-60%, echo, October, 2011  . Ecchymosis     From Coumadin and aspirin  . Preop cardiovascular exam     Cardiac clearance for gallbladder surgery November, 2013  . Pericardial effusion     Insignificant small pericardial effusion seen on echo, November, 2013  . SOB (shortness of breath)     none now  . Heart murmur     hx  . Stroke ([place]) 06    lft weaker  . Peripheral vascular disease ([place]) 06    blood clot -stroke  . GERD (gastroesophageal reflux disease)   . Gallstones   . Anemia     iron deficiency     Past Surgical History  Procedure Laterality Date  . Coronary artery bypass graft    . Cardiac valve replacement      aortic  . Tonsillectomy    . Lumbar fusion      8th grade  . Laparoscopic cholecystectomy single port  07/21/2012    Procedure: LAPAROSCOPIC CHOLECYSTECTOMY SINGLE PORT;  Surgeon: Adin Hector, MD;  Location: [facility];  Service: General;  Laterality: N/A;   Family History  Problem Relation Age of Onset  . Stroke Mother   . Alcohol abuse Father   . Hypertension Father   . Stroke Father   . Hyperlipidemia Father   . Stroke Brother     she has three and at least one of them has had a stroke  . Heart disease Mother    Social History  Substance Use Topics  . Smoking status: Former Smoker -- 1.00 packs/day for 25 years    Types: Cigarettes    Quit date: 07/15/2004  . Smokeless tobacco: Never Used     Comment: quit 2006  . Alcohol Use: No   Current Outpatient Prescriptions  Medication Sig Dispense Refill  . Calcium Carbonate-Vitamin D (CALCIUM 600+D) 600-400 MG-UNIT per tablet Take 1 tablet by mouth 2 (two) times daily.    . furosemide (LASIX) 20 MG tablet Take  20 mg by mouth daily.    Marland Kitchen levothyroxine (SYNTHROID, LEVOTHROID) 50 MCG tablet Take 50 mcg by mouth daily before breakfast.    . omeprazole (PRILOSEC) 20 MG capsule Take 20 mg by mouth daily.      . potassium chloride (MICRO-K) 10 MEQ CR capsule TAKE 1 TABLET BY MOUTH EVERY DAY 15 capsule 0  . Simethicone (PHAZYME PO) Take 250 mg by mouth daily as needed.    . simvastatin (ZOCOR) 40 MG tablet Take 1 tablet (40 mg total) by mouth at bedtime. 15 tablet 0  . triazolam (HALCION) 0.25 MG tablet Take 0.25 mg by mouth at bedtime.    Marland Kitchen warfarin (COUMADIN) 3 MG tablet TAKE AS DIRECTED BY COUMADIN CLINIC 40 tablet 3  . ferrous sulfate 325 (65 FE) MG tablet Take 1 tablet (325 mg total) by mouth daily with breakfast. 100 tablet 3  . polyethylene glycol powder (GLYCOLAX/MIRALAX) powder Take 17grams or One capful daily. 255 g 3  . [DISCONTINUED] KLOR-CON M10 10 MEQ tablet TAKE 1 TABLET BY MOUTH EVERY DAY 30 tablet 0   No current facility-administered medications for this visit.   No Known Allergies   Review of Systems: All systems reviewed and negative except where noted in HPI.   Labs reviewed from 02/18/2015 (not listed in Epic, faxed from [facility]) showed Hgb of 11.5, MCV 73, with low iron levels and low iron sat.  Physical Exam: BP 144/70 mmHg  Pulse 84  Ht 5' 1.5" (1.562 m)  Wt 162 lb 2 oz (73.539 kg)  BMI 30.14 kg/m2 Constitutional: Pleasant,well-developed, female in no acute distress. HEENT: Normocephalic and atraumatic. Conjunctivae are normal. No scleral icterus. Neck supple.  Cardiovascular: Normal rate, regular rhythm. Mechanical heart sounds. Pulmonary/chest: Effort normal and breath sounds normal. No wheezing, rales or rhonchi. Abdominal: Soft, nondistended, nontender. Midline ventral hernia appreciated. Bowel sounds active throughout. There are no masses palpable. No hepatomegaly. Extremities: no edema Lymphadenopathy: No cervical adenopathy noted. Neurological: Alert and oriented to  person place and time. Skin: Skin is warm and dry. No rashes noted. Psychiatric: Normal mood and affect. Behavior is normal.   ASSESSMENT AND PLAN: 59 y/o female with history of CVA, who is s/p aortic valve replacement and CABG with mechanical aortic valve, presenting with a mild iron deficiency anemia. Labs as above. New diagnosis for the patient. She has had no prior CRC screening. She has some mild ongoing constipation but otherwise has no symptoms of overt bleeding. I discussed the differential of iron deficiency anemia with her, and recommend evaluation of her GI tract with upper and  lower endoscopy to start, especially given her need for long term anticoagulation. I discussed the risks / benefits of endoscopy as outlined below with her, and the risks of holding her anticoagulation. She will need clearance by her cardiologist to determine if she can hold coumadin, and defer to their recommendation in regards to how she would be bridged (lovenox vs. Heparin drip). We will get her scheduled for endoscopy but ensure this clearance is done in advance of her procedures. Otherwise recommend she take an iron supplement daily for her anemia, and also recommend miralax daily in light of her constipation and need for iron. She otherwise asked about her ventral hernia, noted on exam. Can consider CT to further evaluate this but she is a high risk surgical candidate and recommend proceeding with her endoscopy evaluation prior to having the hernia evaluated. She agreed.   The indications, risks, and benefits of EGD and colonoscopy were explained to the patient in detail. Risks include but are not limited to bleeding, perforation, adverse reaction to medications, and cardiopulmonary compromise. Sequelae include but are not limited to the possibility of surgery, hositalization, and mortality. The patient verbalized understanding and wished to proceed. All questions answered, referred to scheduler and bowel prep  ordered. Further recommendations pending results of the exam.   [place] Cellar, MD [facility] Pager [phone]

## 2015-04-18 NOTE — Patient Instructions (Signed)
You have been scheduled for an endoscopy and colonoscopy. Please follow the written instructions given to you at your visit today. Please pick up your prep supplies at the pharmacy within the next 1-3 days. If you use inhalers (even only as needed), please bring them with you on the day of your procedure. Your physician has requested that you go to www.startemmi.com and enter the access code given to you at your visit today. This web site gives a general overview about your procedure. However, you should still follow specific instructions given to you by our office regarding your preparation for the procedure.   Please Make sure the Coumadin Clinic discusses holding Warfarin before you procedure.   We have sent the following medications to your pharmacy for you to pick up at your convenience: Ferrous SUlfate 325mg ., Miralax  Begin Miralax daily.

## 2015-04-18 NOTE — Telephone Encounter (Signed)
04/18/2015   RE: CHARLEY LAFRANCE DOB: 04-17-1956 MRN: [id number]   Dear Dr. Ron Parker,    We have scheduled the above patient for an endoscopic procedure. Our records show that she is on anticoagulation therapy.   Please advise as to how long the patient may come off her therapy of Warfarin prior to the procedure, which is scheduled for 06/18/2015.  Please fax back/ or route the completed form to Alexandros Ewan at [phone]. We spoke to the Coumadin Clinic and they advised patient have a Lovenox Bridge.   Sincerely,    Margreta Journey

## 2015-04-19 NOTE — Telephone Encounter (Signed)
Please send this to appropriate doc on the team, as I am no longer doing office work.

## 2015-04-22 ENCOUNTER — Other Ambulatory Visit: Payer: Self-pay

## 2015-04-22 DIAGNOSIS — Z1231 Encounter for screening mammogram for malignant neoplasm of breast: Secondary | ICD-10-CM

## 2015-04-22 NOTE — Telephone Encounter (Signed)
Pt will now be managed by Dr. Meda Coffee.  Pt has a history of mechanical AVR and CVA.  Will need Lovenox bridging and we will manage this closer to procedure date.

## 2015-04-23 ENCOUNTER — Other Ambulatory Visit: Payer: Self-pay

## 2015-04-23 DIAGNOSIS — D509 Iron deficiency anemia, unspecified: Secondary | ICD-10-CM

## 2015-04-29 ENCOUNTER — Ambulatory Visit (INDEPENDENT_AMBULATORY_CARE_PROVIDER_SITE_OTHER): Payer: Commercial Managed Care - HMO | Admitting: Cardiology

## 2015-04-29 ENCOUNTER — Ambulatory Visit (INDEPENDENT_AMBULATORY_CARE_PROVIDER_SITE_OTHER): Payer: Commercial Managed Care - HMO | Admitting: *Deleted

## 2015-04-29 ENCOUNTER — Encounter: Payer: Self-pay | Admitting: Cardiology

## 2015-04-29 VITALS — BP 132/74 | HR 73 | Ht 61.5 in | Wt 162.0 lb

## 2015-04-29 DIAGNOSIS — Z951 Presence of aortocoronary bypass graft: Secondary | ICD-10-CM

## 2015-04-29 DIAGNOSIS — Z954 Presence of other heart-valve replacement: Secondary | ICD-10-CM

## 2015-04-29 DIAGNOSIS — E785 Hyperlipidemia, unspecified: Secondary | ICD-10-CM

## 2015-04-29 DIAGNOSIS — Z952 Presence of prosthetic heart valve: Secondary | ICD-10-CM

## 2015-04-29 DIAGNOSIS — I639 Cerebral infarction, unspecified: Secondary | ICD-10-CM | POA: Diagnosis not present

## 2015-04-29 DIAGNOSIS — I10 Essential (primary) hypertension: Secondary | ICD-10-CM | POA: Diagnosis not present

## 2015-04-29 DIAGNOSIS — I350 Nonrheumatic aortic (valve) stenosis: Secondary | ICD-10-CM | POA: Diagnosis not present

## 2015-04-29 DIAGNOSIS — I359 Nonrheumatic aortic valve disorder, unspecified: Secondary | ICD-10-CM

## 2015-04-29 DIAGNOSIS — Z7901 Long term (current) use of anticoagulants: Secondary | ICD-10-CM

## 2015-04-29 DIAGNOSIS — I6350 Cerebral infarction due to unspecified occlusion or stenosis of unspecified cerebral artery: Secondary | ICD-10-CM

## 2015-04-29 LAB — BASIC METABOLIC PANEL
BUN: 9 mg/dL (ref 7–25)
CO2: 25 mmol/L (ref 20–31)
Calcium: 9.7 mg/dL (ref 8.6–10.4)
Chloride: 101 mmol/L (ref 98–110)
Creat: 1.04 mg/dL (ref 0.50–1.05)
Glucose, Bld: 80 mg/dL (ref 65–99)
Potassium: 3.7 mmol/L (ref 3.5–5.3)
Sodium: 138 mmol/L (ref 135–146)

## 2015-04-29 LAB — POCT INR: INR: 2.1

## 2015-04-29 NOTE — Progress Notes (Signed)
Patient ID: Elizabeth Morton, female   DOB: 01/31/1956, 59 y.o.   MRN: [id number]      Cardiology Office Note   Date:  04/29/2015   ID:  Elizabeth Morton, DOB 1956-01-07, MRN [id number]  PCP:  Elizabeth Peaches, MD  Cardiologist:  Dorothy Spark, MD , prior patient of Dr Ron Parker  History of Present Illness: Elizabeth Morton is a 59 y.o. female who presents for follow up. She is a pior patient of Dr Ron Parker, last seen him in 2013. She is s/p CABG and AVR in 2006 (mechanical aortic valve as part of a Bental procedure with replacement of her ascending aortic root), she is also seen for cardiac clearance for upcoming colonoscopy. She has been doing well, no chest pain or SOB. She has gain weight in the last 3 years (18 lbs) that she blames on physical inactivity. Denies claudications, orthopnea, palpitations or syncope. No bleeding. Complaint with her meds, No muscle pain. Followed in our Coumadin clinic.  Past Medical History  Diagnosis Date  . Warfarin anticoagulation     coumadin therapy  . Hypertension   . Hx of CABG     2006,LIMA to LAD, SVG to diagonal  . 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 Dyslipidemia   . CAD (coronary artery disease)   . Heart valve replaced by other means 03/2005    bentall procedure.#21 st.jude mechanical vavle conduit with re- implantation of coronaries. good valve function ..echo 10/09,mechanical valve working well.. echo 10/11.  . S/P aortic valve replacement     Bental procedure,#21 St. Jude mechanical valve conduit with reimplantation of the coronaries 2006 / valve working well, echo, October, 2009 / valve working well echo, October, 2011  . Indigestion 10/11  . Aortic stenosis     Aortic valve replacement 2006  . Ejection fraction     60%, echo, 2009 / 55-60%, echo, October, 2011  . Ecchymosis     From Coumadin and aspirin  . Preop cardiovascular exam     Cardiac clearance for gallbladder surgery November, 2013  .  Pericardial effusion     Insignificant small pericardial effusion seen on echo, November, 2013  . SOB (shortness of breath)     none now  . Heart murmur     hx  . Stroke ([place]) 06    lft weaker  . Peripheral vascular disease ([place]) 06    blood clot -stroke  . GERD (gastroesophageal reflux disease)   . Gallstones   . Anemia     iron deficiency    Past Surgical History  Procedure Laterality Date  . Coronary artery bypass graft    . Cardiac valve replacement      aortic  . Tonsillectomy    . Lumbar fusion      8th grade  . Laparoscopic cholecystectomy single port  07/21/2012    Procedure: LAPAROSCOPIC CHOLECYSTECTOMY SINGLE PORT;  Surgeon: Adin Hector, MD;  Location: [facility];  Service: General;  Laterality: N/A;   Current Outpatient Prescriptions  Medication Sig Dispense Refill  . Calcium Carbonate-Vitamin D (CALCIUM 600+D) 600-400 MG-UNIT per tablet Take 1 tablet by mouth 2 (two) times daily.    . ferrous sulfate 325 (65 FE) MG tablet Take 1 tablet (325 mg total) by mouth daily with breakfast. 100 tablet 3  . furosemide (LASIX) 20 MG tablet Take 20 mg by mouth daily.    Marland Kitchen levothyroxine (SYNTHROID, LEVOTHROID) 50 MCG tablet  Take 50 mcg by mouth daily before breakfast.    . omeprazole (PRILOSEC) 20 MG capsule Take 20 mg by mouth daily.      . polyethylene glycol powder (GLYCOLAX/MIRALAX) powder Take 17grams or One capful daily. 255 g 3  . potassium chloride (MICRO-K) 10 MEQ CR capsule TAKE 1 TABLET BY MOUTH EVERY DAY 15 capsule 0  . Simethicone (PHAZYME PO) Take 250 mg by mouth daily as needed.    . simvastatin (ZOCOR) 40 MG tablet Take 1 tablet (40 mg total) by mouth at bedtime. 15 tablet 0  . triazolam (HALCION) 0.25 MG tablet Take 0.25 mg by mouth at bedtime.    Marland Kitchen warfarin (COUMADIN) 3 MG tablet TAKE AS DIRECTED BY COUMADIN CLINIC 40 tablet 3  . [DISCONTINUED] KLOR-CON M10 10 MEQ tablet TAKE 1 TABLET BY MOUTH EVERY DAY 30 tablet 0   No current facility-administered medications  for this visit.    Allergies:   Review of patient's allergies indicates no known allergies.    Social History:  The patient  reports that she quit smoking about 10 years ago. Her smoking use included Cigarettes. She has a 25 pack-year smoking history. She has never used smokeless tobacco. She reports that she does not drink alcohol or use illicit drugs.   Family History:  The patient's family history includes Alcohol abuse in her father; Heart disease in her mother; Hyperlipidemia in her father; Hypertension in her father; Stroke in her brother, father, and mother.   ROS:  Please see the history of present illness.   Otherwise, review of systems are positive for none.   All other systems are reviewed and negative.   PHYSICAL EXAM: VS:  BP 132/74 mmHg  Pulse 73  Ht 5' 1.5" (1.562 m)  Wt 162 lb (73.483 kg)  BMI 30.12 kg/m2 , BMI Body mass index is 30.12 kg/(m^2). GEN: Well nourished, well developed, in no acute distress HEENT: normal Neck: no JVD, carotid bruits, or masses Cardiac:RRR; systolic murmur, S2 click, no rubs, or gallops,no edema  Respiratory:  clear to auscultation bilaterally, normal work of breathing GI: soft, nontender, nondistended, + BS MS: no deformity or atrophy Skin: warm and dry, no rash Neuro:  Strength and sensation are intact Psych: euthymic mood, full affect  EKG:  EKG is ordered today. The ekg ordered today demonstrates SR, negative T waves in the anterior and inferior leads, unchanged from 2013.  Recent Labs: No results found for requested labs within last 365 days.   Lipid Panel No results found for: CHOL, TRIG, HDL, CHOLHDL, VLDL, LDLCALC, LDLDIRECT   Wt Readings from Last 3 Encounters:  04/29/15 162 lb (73.483 kg)  04/18/15 162 lb 2 oz (73.539 kg)  08/15/12 144 lb 6.4 oz (65.499 kg)    TTE: 02/2015 - Left ventricle: The cavity size was normal. Wall thickness was increased in a pattern of mild LVH. Systolic function was normal. The estimated  ejection fraction was in the range of 60% to 65%. Regional wall motion abnormalities cannot be excluded. Doppler parameters are consistent with abnormal left ventricular relaxation (grade 1 diastolic dysfunction). - Aortic valve: A mechanical prosthesis was present. - Mitral valve: Calcified annulus. There was mild regurgitation. - Left atrium: The atrium was mildly dilated. - Right ventricle: Hypertrophy was present. - Pericardium, extracardiac: A trivial pericardial effusion was identified.  Impressions: - Normal LV function; mild LVH; mild LAE; prosthetic aortic valve with elevated mean gradient of 30 mmHg; no AI; mild MR; trivial pericardial effusion.  ASSESSMENT AND PLAN:  1. CAD, s/p CABG in 2016, abnormal ECG, however unchanged from 2013 and she is asymptomatic, continue ASA, simvastatin. Advised to start exercising.  2. S/P AVR - transaortic gradients are abnormal with mean gradient 30 mmHg, it was 13 mmHg in 2013, she is asymptomatic, we will repeat echocardiogram in 1 year. She is on Coumadin, followed by Coumadin clinic, they will manage bridging for the upcoming colonoscopy.  3. HTN - controlled  4. Hyperlipidemia - followed by PCP< on simvastatin.  Follow up in 1 year with echocardiogram prior to the visit.  Signed, Dorothy Spark, MD  04/29/2015 2:40 PM    [facility] [address] [place], [place], [place]  [address] Phone: [phone]; Fax: [phone]

## 2015-04-29 NOTE — Patient Instructions (Signed)
Medication Instructions:   Your physician recommends that you continue on your current medications as directed. Please refer to the Current Medication list given to you today.    Labwork:  TODAY---BMET    Testing/Procedures:  Your physician has requested that you have an echocardiogram. Echocardiography is a painless test that uses sound waves to create images of your heart. It provides your doctor with information about the size and shape of your heart and how well your heart's chambers and valves are working. This procedure takes approximately one hour. There are no restrictions for this procedure.   PLEASE HAVE THIS SCHEDULED FOR ONE YEAR OUT PER DR NELSON    Follow-Up:  Your physician wants you to follow-up in: [place] will receive a reminder letter in the mail two months in advance. If you don't receive a letter, please call our office to schedule the follow-up appointment.

## 2015-04-30 ENCOUNTER — Telehealth: Payer: Self-pay | Admitting: Gastroenterology

## 2015-04-30 MED ORDER — NA SULFATE-K SULFATE-MG SULF 17.5-3.13-1.6 GM/177ML PO SOLN: ORAL | Status: DC

## 2015-04-30 NOTE — Telephone Encounter (Signed)
Called pt to inform her that prep was sent to pharmacy and to answer any questions she had about miralax. Left vm for pt to call back

## 2015-04-30 NOTE — Telephone Encounter (Signed)
Patient returned phone call. Best # [phone]

## 2015-05-01 NOTE — Telephone Encounter (Signed)
Left vm for pt to callback 

## 2015-05-01 NOTE — Telephone Encounter (Signed)
Pt called back and we discussed the questions she had.

## 2015-05-14 ENCOUNTER — Other Ambulatory Visit: Payer: Self-pay | Admitting: Internal Medicine

## 2015-05-14 DIAGNOSIS — M8580 Other specified disorders of bone density and structure, unspecified site: Secondary | ICD-10-CM

## 2015-05-31 ENCOUNTER — Ambulatory Visit: Payer: Commercial Managed Care - HMO

## 2015-06-04 ENCOUNTER — Ambulatory Visit
Admission: RE | Admit: 2015-06-04 | Discharge: 2015-06-04 | Disposition: A | Discharge status: Home / Self Care | Payer: Commercial Managed Care - HMO | Source: Ambulatory Visit

## 2015-06-04 DIAGNOSIS — Z1231 Encounter for screening mammogram for malignant neoplasm of breast: Secondary | ICD-10-CM

## 2015-06-10 ENCOUNTER — Ambulatory Visit (INDEPENDENT_AMBULATORY_CARE_PROVIDER_SITE_OTHER): Payer: Commercial Managed Care - HMO

## 2015-06-10 DIAGNOSIS — Z7901 Long term (current) use of anticoagulants: Secondary | ICD-10-CM

## 2015-06-10 DIAGNOSIS — I359 Nonrheumatic aortic valve disorder, unspecified: Secondary | ICD-10-CM | POA: Diagnosis not present

## 2015-06-10 DIAGNOSIS — Z954 Presence of other heart-valve replacement: Secondary | ICD-10-CM

## 2015-06-10 DIAGNOSIS — I639 Cerebral infarction, unspecified: Secondary | ICD-10-CM | POA: Diagnosis not present

## 2015-06-10 DIAGNOSIS — Z952 Presence of prosthetic heart valve: Secondary | ICD-10-CM

## 2015-06-10 DIAGNOSIS — I6350 Cerebral infarction due to unspecified occlusion or stenosis of unspecified cerebral artery: Secondary | ICD-10-CM

## 2015-06-10 LAB — POCT INR: INR: 2.6

## 2015-06-10 MED ORDER — ENOXAPARIN SODIUM 80 MG/0.8ML ~~LOC~~ SOLN: 80.0000 mg | Freq: Two times a day (BID) | SUBCUTANEOUS | Status: DC

## 2015-06-10 NOTE — Patient Instructions (Addendum)
06/12/15 Take your last dosage of Coumadin prior to procedure on 06/18/15. 06/13/15 No Coumadin, No Lovenox. 06/14/15 Start taking Lovenox 80mg  once in the am, and Lovenox 80mg  once in the pm. 06/15/15 Take Lovenox 80mg  once in the am, and Lovenox 80mg  once in the pm. 06/16/15 Take Lovenox 80mg  once in the am, and Lovenox 80mg  once in the pm. 06/17/15 Take Lovenox 80mg  in the am, No Lovenox in the pm prior to your procedure on 06/18/15. 06/18/15 No Lovenox.  Once the MD states ok to resume Coumadin take an extra 1/2 tablet x 2 days, then resume previous dosage regimen 1 tablet daily except 1/2 tablet on Mondays, Wednesdays and Fridays.  Once MD states ok to resume Lovenox take twice daily (every 12 hrs) and continue on until INR rechecked and 2.0 or greater.

## 2015-06-12 ENCOUNTER — Telehealth: Payer: Self-pay | Admitting: Gastroenterology

## 2015-06-12 NOTE — Telephone Encounter (Signed)
Pt was just calling to let us know that she will start Lovenox shots Friday. She has been informed by the Coumadin clinic on the start and stop of warfarin before her procedure.

## 2015-06-13 DIAGNOSIS — K922 Gastrointestinal hemorrhage, unspecified: Secondary | ICD-10-CM

## 2015-06-13 HISTORY — DX: Gastrointestinal hemorrhage, unspecified: K92.2

## 2015-06-14 ENCOUNTER — Ambulatory Visit
Admission: RE | Admit: 2015-06-14 | Discharge: 2015-06-14 | Disposition: A | Discharge status: Home / Self Care | Payer: Commercial Managed Care - HMO | Source: Ambulatory Visit | Attending: Internal Medicine | Admitting: Internal Medicine

## 2015-06-14 DIAGNOSIS — M8580 Other specified disorders of bone density and structure, unspecified site: Secondary | ICD-10-CM

## 2015-06-18 ENCOUNTER — Encounter: Payer: Self-pay | Admitting: Internal Medicine

## 2015-06-18 ENCOUNTER — Ambulatory Visit (AMBULATORY_SURGERY_CENTER): Payer: Commercial Managed Care - HMO | Admitting: Internal Medicine

## 2015-06-18 ENCOUNTER — Other Ambulatory Visit: Payer: Self-pay

## 2015-06-18 VITALS — BP 98/66 | HR 75 | Temp 98.1°F | Resp 16 | Ht 61.5 in | Wt 162.0 lb

## 2015-06-18 DIAGNOSIS — D122 Benign neoplasm of ascending colon: Secondary | ICD-10-CM | POA: Diagnosis not present

## 2015-06-18 DIAGNOSIS — D509 Iron deficiency anemia, unspecified: Secondary | ICD-10-CM | POA: Diagnosis not present

## 2015-06-18 DIAGNOSIS — K439 Ventral hernia without obstruction or gangrene: Secondary | ICD-10-CM

## 2015-06-18 DIAGNOSIS — R1084 Generalized abdominal pain: Secondary | ICD-10-CM

## 2015-06-18 HISTORY — PX: ESOPHAGOGASTRODUODENOSCOPY: SHX1529

## 2015-06-18 HISTORY — PX: COLONOSCOPY W/ BIOPSIES AND POLYPECTOMY: SHX1376

## 2015-06-18 MED ORDER — SODIUM CHLORIDE 0.9 % IV SOLN: 500.0000 mL | INTRAVENOUS | Status: DC

## 2015-06-18 NOTE — Progress Notes (Signed)
Gave pt. Information to fill out for taking contrast prior to CT scan with directions to facility. Card given with clip information.

## 2015-06-18 NOTE — Op Note (Signed)
[place]  [address]. [place], [address]   ENDOSCOPY PROCEDURE REPORT  PATIENT: Elizabeth Morton, Elizabeth Morton  MR#: [id number] BIRTHDATE: 08-Oct-1955 , 45  yrs. old GENDER: female ENDOSCOPIST: Eustace Quail, MD REFERRED BY:  Levin Erp, M.D. PROCEDURE DATE:  06/18/2015 PROCEDURE:  EGD, diagnostic ASA CLASS:     Class III INDICATIONS:  iron deficiency anemia. MEDICATIONS: Monitored anesthesia care and Propofol 100 mg IV TOPICAL ANESTHETIC: none  DESCRIPTION OF PROCEDURE: After the risks benefits and alternatives of the procedure were thoroughly explained, informed consent was obtained.  The LB [id number] endoscope was introduced through the mouth and advanced to the second portion of the duodenum , Without limitations.  The instrument was slowly withdrawn as the mucosa was fully examined.      EXAM: The esophagus and gastroesophageal junction were completely normal in appearance.  The stomach was entered and closely examined.The antrum, angularis, and lesser curvature were well visualized, including a retroflexed view of the cardia and fundus. The stomach wall was normally distensable.  The scope passed easily through the pylorus into the duodenum.  Retroflexed views revealed a hiatal hernia.     The scope was then withdrawn from the patient and the procedure completed.  COMPLICATIONS: There were no immediate complications.  ENDOSCOPIC IMPRESSION: 1. Normal EGD  RECOMMENDATIONS: 1. Continue current medications 2. Please see complete list of recommendations from colonoscopy report  REPEAT EXAM:  eSigned:  Eustace Quail, MD 06/18/2015 2:59 PM    CC:The Patient , Jolly Mango M.D.,and Levin Erp, MD

## 2015-06-18 NOTE — Progress Notes (Signed)
Called to room to assist during endoscopic procedure.  Patient ID and intended procedure confirmed with present staff. Received instructions for my participation in the procedure from the performing physician.  

## 2015-06-18 NOTE — Progress Notes (Signed)
CT scan scheduled.  Pt. Given instructions to have scan at [facility]. All directions given.

## 2015-06-18 NOTE — Op Note (Signed)
[place]  [address]. [place], [address]   COLONOSCOPY PROCEDURE REPORT  PATIENT: Elizabeth Morton, Elizabeth Morton  MR#: [id number] BIRTHDATE: 05-Aug-1955 , 89  yrs. old GENDER: female ENDOSCOPIST: Eustace Quail, MD REFERRED [id number] Nyoka Cowden, M.D. PROCEDURE DATE:  06/18/2015 PROCEDURE:   Colonoscopy, diagnostic, Colonoscopy with snare polypectomy, and Submucosal injection, tattoo ink and prophylactic Endo Clip placement. Extended service modifier  ASA CLASS:   Class III INDICATIONS:iron deficiency anemia.. On chronic Coumadin for history of CVA and AVR MEDICATIONS: Monitored anesthesia care and Propofol 600 mg IV  DESCRIPTION OF PROCEDURE:   After the risks benefits and alternatives of the procedure were thoroughly explained, informed consent was obtained.  The digital rectal exam revealed no abnormalities of the rectum.   The LB [id number]  endoscope was introduced through the anus and advanced to the cecum, which was identified by both the appendix and ileocecal valve. No adverse events experienced.   The quality of the prep was good.  (Suprep was used)  The instrument was then slowly withdrawn as the colon was fully examined. Estimated blood loss is zero unless otherwise noted in this procedure report.  COLON FINDINGS: A single sessile polyp measuring 30 mm in size was found in the ascending colon. The polyp was easily raised with saline and removed piecemeal with snare cautery. The polypectomy was made difficult due to positioning, with redundant colon and looping scope. As well, the polyp straddled both sides of the haustral fold. Marking tattoo was placed on the contralateral side just downstream. 1 prophylactic Endo Clip was placed in the midportion of the defect.   The examination was otherwise normal. Retroflexed views revealed no abnormalities. Time cecum = 4.2 Withdrawal time = 55.6   The scope was withdrawn and the procedure completed. COMPLICATIONS:  There were no immediate complications.  ENDOSCOPIC IMPRESSION: 1.   A large sessile polyp was found in the ascending colon and removed piecemeal as described 2.   The examination was otherwise normal  RECOMMENDATIONS: 1.  Await pathology results 2.  Resume Coumadin (warfarin) and Lovenox today . Follow the instructions previously given by the Coumadin clinic. 3.  Repeat Colonoscopy in 3-6 months, if no cancer upon pathologic specimen. 4. Schedule contrast-enhanced CT scan of the abdomen and pelvis "abdominal pain, ventral hernia, evaluate" 5. Follow-up with Dr. Havery Moros in the office in about 4 weeks  eSigned:  Eustace Quail, MD 06/18/2015 2:56 PM   cc: The Patient , Jolly Mango M.D.,and Levin Erp, MD

## 2015-06-18 NOTE — Progress Notes (Signed)
Report to PACU, RN, vss, BBS= Clear.  

## 2015-06-18 NOTE — Patient Instructions (Addendum)
YOU HAD AN ENDOSCOPIC PROCEDURE TODAY AT [facility]:   Refer to the procedure report that was given to you for any specific questions about what was found during the examination.  If the procedure report does not answer your questions, please call your gastroenterologist to clarify.  If you requested that your care partner not be given the details of your procedure findings, then the procedure report has been included in a sealed envelope for you to review at your convenience later.  YOU SHOULD EXPECT: Some feelings of bloating in the abdomen. Passage of more gas than usual.  Walking can help get rid of the air that was put into your GI tract during the procedure and reduce the bloating. If you had a lower endoscopy (such as a colonoscopy or flexible sigmoidoscopy) you may notice spotting of blood in your stool or on the toilet paper. If you underwent a bowel prep for your procedure, you may not have a normal bowel movement for a few days.  Please Note:  You might notice some irritation and congestion in your nose or some drainage.  This is from the oxygen used during your procedure.  There is no need for concern and it should clear up in a day or so.  SYMPTOMS TO REPORT IMMEDIATELY:   Following lower endoscopy (colonoscopy or flexible sigmoidoscopy):  Excessive amounts of blood in the stool  Significant tenderness or worsening of abdominal pains  Swelling of the abdomen that is new, acute  Fever of 100F or higher  For urgent or emergent issues, a gastroenterologist can be reached at any hour by calling [phone].   DIET: Your first meal following the procedure should be a small meal and then it is ok to progress to your normal diet. Heavy or fried foods are harder to digest and may make you feel nauseous or bloated.  Likewise, meals heavy in dairy and vegetables can increase bloating.  Drink plenty of fluids but you should avoid alcoholic beverages for 24  hours.  ACTIVITY:  You should plan to take it easy for the rest of today and you should NOT DRIVE or use heavy machinery until tomorrow (because of the sedation medicines used during the test).    FOLLOW UP: Our staff will call the number listed on your records the next business day following your procedure to check on you and address any questions or concerns that you may have regarding the information given to you following your procedure. If we do not reach you, we will leave a message.  However, if you are feeling well and you are not experiencing any problems, there is no need to return our call.  We will assume that you have returned to your regular daily activities without incident.  If any biopsies were taken you will be contacted by phone or by letter within the next 1-3 weeks.  Please call us at [phone] if you have not heard about the biopsies in 3 weeks.    SIGNATURES/CONFIDENTIALITY: You and/or your care partner have signed paperwork which will be entered into your electronic medical record.  These signatures attest to the fact that that the information above on your After Visit Summary has been reviewed and is understood.  Full responsibility of the confidentiality of this discharge information lies with you and/or your care-partner.  Resume lovenox and coumadin today.Repeat colonoscopy in 3-6 months, if no cancer on pathology,  Schedule CT scan. Follow up with Dr. Havery Moros in 4  weeks. Polyp information given.

## 2015-06-19 ENCOUNTER — Telehealth: Payer: Self-pay | Admitting: *Deleted

## 2015-06-19 NOTE — Telephone Encounter (Signed)
  Follow up Call-  Call back number 06/18/2015  Post procedure Call Back phone  # [phone]  Permission to leave phone message Yes     Patient questions:  Do you have a fever, pain , or abdominal swelling? No. Pain Score  0 *  Have you tolerated food without any problems? Yes.    Have you been able to return to your normal activities? Yes.    Do you have any questions about your discharge instructions: Diet   No. Medications  No. Follow up visit  No.  Do you have questions or concerns about your Care? No.  Actions: * If pain score is 4 or above: No action needed, pain <4.

## 2015-06-21 ENCOUNTER — Ambulatory Visit (INDEPENDENT_AMBULATORY_CARE_PROVIDER_SITE_OTHER): Payer: Commercial Managed Care - HMO | Admitting: *Deleted

## 2015-06-21 DIAGNOSIS — Z7901 Long term (current) use of anticoagulants: Secondary | ICD-10-CM

## 2015-06-21 DIAGNOSIS — Z954 Presence of other heart-valve replacement: Secondary | ICD-10-CM | POA: Diagnosis not present

## 2015-06-21 DIAGNOSIS — I639 Cerebral infarction, unspecified: Secondary | ICD-10-CM | POA: Diagnosis not present

## 2015-06-21 DIAGNOSIS — I359 Nonrheumatic aortic valve disorder, unspecified: Secondary | ICD-10-CM

## 2015-06-21 DIAGNOSIS — Z952 Presence of prosthetic heart valve: Secondary | ICD-10-CM

## 2015-06-21 DIAGNOSIS — I6350 Cerebral infarction due to unspecified occlusion or stenosis of unspecified cerebral artery: Secondary | ICD-10-CM

## 2015-06-21 LAB — POCT INR: INR: 1.3

## 2015-06-24 ENCOUNTER — Encounter: Payer: Self-pay | Admitting: Internal Medicine

## 2015-06-25 ENCOUNTER — Ambulatory Visit (INDEPENDENT_AMBULATORY_CARE_PROVIDER_SITE_OTHER): Payer: Commercial Managed Care - HMO | Admitting: Pharmacist Clinician (PhC)/ Clinical Pharmacy Specialist

## 2015-06-25 ENCOUNTER — Inpatient Hospital Stay (HOSPITAL_COMMUNITY)
Admission: EM | Admit: 2015-06-25 | Discharge: 2015-06-29 | DRG: 920 | Disposition: A | Discharge status: Home / Self Care | Payer: Commercial Managed Care - HMO | Attending: Internal Medicine | Admitting: Internal Medicine

## 2015-06-25 ENCOUNTER — Telehealth: Payer: Self-pay | Admitting: Internal Medicine

## 2015-06-25 ENCOUNTER — Encounter (HOSPITAL_COMMUNITY): Payer: Self-pay | Admitting: Neurology

## 2015-06-25 DIAGNOSIS — E876 Hypokalemia: Secondary | ICD-10-CM | POA: Diagnosis present

## 2015-06-25 DIAGNOSIS — D62 Acute posthemorrhagic anemia: Secondary | ICD-10-CM | POA: Diagnosis present

## 2015-06-25 DIAGNOSIS — Z954 Presence of other heart-valve replacement: Secondary | ICD-10-CM

## 2015-06-25 DIAGNOSIS — Z7902 Long term (current) use of antithrombotics/antiplatelets: Secondary | ICD-10-CM

## 2015-06-25 DIAGNOSIS — Z981 Arthrodesis status: Secondary | ICD-10-CM

## 2015-06-25 DIAGNOSIS — I5032 Chronic diastolic (congestive) heart failure: Secondary | ICD-10-CM | POA: Diagnosis present

## 2015-06-25 DIAGNOSIS — E785 Hyperlipidemia, unspecified: Secondary | ICD-10-CM | POA: Diagnosis present

## 2015-06-25 DIAGNOSIS — I639 Cerebral infarction, unspecified: Secondary | ICD-10-CM

## 2015-06-25 DIAGNOSIS — I739 Peripheral vascular disease, unspecified: Secondary | ICD-10-CM | POA: Diagnosis present

## 2015-06-25 DIAGNOSIS — Z9049 Acquired absence of other specified parts of digestive tract: Secondary | ICD-10-CM

## 2015-06-25 DIAGNOSIS — Z823 Family history of stroke: Secondary | ICD-10-CM

## 2015-06-25 DIAGNOSIS — D509 Iron deficiency anemia, unspecified: Secondary | ICD-10-CM | POA: Diagnosis present

## 2015-06-25 DIAGNOSIS — R109 Unspecified abdominal pain: Secondary | ICD-10-CM

## 2015-06-25 DIAGNOSIS — E039 Hypothyroidism, unspecified: Secondary | ICD-10-CM | POA: Diagnosis present

## 2015-06-25 DIAGNOSIS — I6350 Cerebral infarction due to unspecified occlusion or stenosis of unspecified cerebral artery: Secondary | ICD-10-CM

## 2015-06-25 DIAGNOSIS — T45515A Adverse effect of anticoagulants, initial encounter: Secondary | ICD-10-CM | POA: Diagnosis present

## 2015-06-25 DIAGNOSIS — K921 Melena: Secondary | ICD-10-CM | POA: Diagnosis present

## 2015-06-25 DIAGNOSIS — K439 Ventral hernia without obstruction or gangrene: Secondary | ICD-10-CM

## 2015-06-25 DIAGNOSIS — I10 Essential (primary) hypertension: Secondary | ICD-10-CM

## 2015-06-25 DIAGNOSIS — Z87891 Personal history of nicotine dependence: Secondary | ICD-10-CM | POA: Diagnosis not present

## 2015-06-25 DIAGNOSIS — K219 Gastro-esophageal reflux disease without esophagitis: Secondary | ICD-10-CM | POA: Diagnosis present

## 2015-06-25 DIAGNOSIS — Z951 Presence of aortocoronary bypass graft: Secondary | ICD-10-CM | POA: Diagnosis not present

## 2015-06-25 DIAGNOSIS — Z7901 Long term (current) use of anticoagulants: Secondary | ICD-10-CM

## 2015-06-25 DIAGNOSIS — N179 Acute kidney failure, unspecified: Secondary | ICD-10-CM | POA: Diagnosis present

## 2015-06-25 DIAGNOSIS — I2510 Atherosclerotic heart disease of native coronary artery without angina pectoris: Secondary | ICD-10-CM | POA: Diagnosis present

## 2015-06-25 DIAGNOSIS — Z8249 Family history of ischemic heart disease and other diseases of the circulatory system: Secondary | ICD-10-CM | POA: Diagnosis not present

## 2015-06-25 DIAGNOSIS — K922 Gastrointestinal hemorrhage, unspecified: Secondary | ICD-10-CM | POA: Diagnosis present

## 2015-06-25 DIAGNOSIS — Z952 Presence of prosthetic heart valve: Secondary | ICD-10-CM

## 2015-06-25 DIAGNOSIS — Z8673 Personal history of transient ischemic attack (TIA), and cerebral infarction without residual deficits: Secondary | ICD-10-CM | POA: Diagnosis not present

## 2015-06-25 DIAGNOSIS — I110 Hypertensive heart disease with heart failure: Secondary | ICD-10-CM | POA: Diagnosis present

## 2015-06-25 DIAGNOSIS — I959 Hypotension, unspecified: Secondary | ICD-10-CM | POA: Diagnosis present

## 2015-06-25 DIAGNOSIS — K91840 Postprocedural hemorrhage and hematoma of a digestive system organ or structure following a digestive system procedure: Principal | ICD-10-CM | POA: Diagnosis present

## 2015-06-25 DIAGNOSIS — I9589 Other hypotension: Secondary | ICD-10-CM | POA: Diagnosis not present

## 2015-06-25 DIAGNOSIS — I359 Nonrheumatic aortic valve disorder, unspecified: Secondary | ICD-10-CM

## 2015-06-25 DIAGNOSIS — Y838 Other surgical procedures as the cause of abnormal reaction of the patient, or of later complication, without mention of misadventure at the time of the procedure: Secondary | ICD-10-CM | POA: Diagnosis present

## 2015-06-25 HISTORY — DX: Hypothyroidism, unspecified: E03.9

## 2015-06-25 HISTORY — DX: Other specified postprocedural states: Z98.890

## 2015-06-25 HISTORY — DX: Iron deficiency anemia, unspecified: D50.9

## 2015-06-25 LAB — CBC WITH DIFFERENTIAL/PLATELET
BASOS ABS: 0 10*3/uL (ref 0.0–0.1)
BASOS PCT: 0 %
EOS ABS: 0 10*3/uL (ref 0.0–0.7)
EOS PCT: 1 %
HCT: 28 % — ABNORMAL LOW (ref 36.0–46.0)
Hemoglobin: 9.1 g/dL — ABNORMAL LOW (ref 12.0–15.0)
Lymphocytes Relative: 29 %
Lymphs Abs: 2.3 10*3/uL (ref 0.7–4.0)
MCH: 27.1 pg (ref 26.0–34.0)
MCHC: 32.5 g/dL (ref 30.0–36.0)
MCV: 83.3 fL (ref 78.0–100.0)
MONO ABS: 0.4 10*3/uL (ref 0.1–1.0)
Monocytes Relative: 5 %
Neutro Abs: 5.3 10*3/uL (ref 1.7–7.7)
Neutrophils Relative %: 65 %
PLATELETS: 318 10*3/uL (ref 150–400)
RBC: 3.36 MIL/uL — AB (ref 3.87–5.11)
RDW: 19.1 % — AB (ref 11.5–15.5)
WBC: 8 10*3/uL (ref 4.0–10.5)

## 2015-06-25 LAB — COMPREHENSIVE METABOLIC PANEL
ALBUMIN: 3.9 g/dL (ref 3.5–5.0)
ALK PHOS: 66 U/L (ref 38–126)
ALT: 26 U/L (ref 14–54)
AST: 26 U/L (ref 15–41)
Anion gap: 11 (ref 5–15)
BUN: 24 mg/dL — ABNORMAL HIGH (ref 6–20)
CALCIUM: 9.8 mg/dL (ref 8.9–10.3)
CHLORIDE: 103 mmol/L (ref 101–111)
CO2: 23 mmol/L (ref 22–32)
CREATININE: 1.25 mg/dL — AB (ref 0.44–1.00)
GFR calc Af Amer: 54 mL/min — ABNORMAL LOW (ref >60)
GFR calc non Af Amer: 46 mL/min — ABNORMAL LOW (ref >60)
GLUCOSE: 127 mg/dL — AB (ref 65–99)
Potassium: 3.2 mmol/L — ABNORMAL LOW (ref 3.5–5.1)
SODIUM: 137 mmol/L (ref 135–145)
Total Bilirubin: 0.5 mg/dL (ref 0.3–1.2)
Total Protein: 6.7 g/dL (ref 6.5–8.1)

## 2015-06-25 LAB — POC OCCULT BLOOD, ED: FECAL OCCULT BLD: POSITIVE — AB

## 2015-06-25 LAB — CBC
HCT: 30.4 % — ABNORMAL LOW (ref 36.0–46.0)
HEMOGLOBIN: 10 g/dL — AB (ref 12.0–15.0)
MCH: 27.3 pg (ref 26.0–34.0)
MCHC: 32.9 g/dL (ref 30.0–36.0)
MCV: 83.1 fL (ref 78.0–100.0)
Platelets: 348 10*3/uL (ref 150–400)
RBC: 3.66 MIL/uL — ABNORMAL LOW (ref 3.87–5.11)
RDW: 18.9 % — ABNORMAL HIGH (ref 11.5–15.5)
WBC: 8.8 10*3/uL (ref 4.0–10.5)

## 2015-06-25 LAB — PROTIME-INR
INR: 1.7 — ABNORMAL HIGH (ref 0.00–1.49)
Prothrombin Time: 20 seconds — ABNORMAL HIGH (ref 11.6–15.2)

## 2015-06-25 LAB — ABO/RH: ABO/RH(D): AB POS

## 2015-06-25 LAB — POCT INR: INR: 1.6

## 2015-06-25 MED ORDER — ACETAMINOPHEN 325 MG PO TABS: 650.0000 mg | ORAL_TABLET | Freq: Four times a day (QID) | ORAL | Status: DC | PRN

## 2015-06-25 MED ORDER — ONDANSETRON HCL 4 MG PO TABS: 4.0000 mg | ORAL_TABLET | Freq: Four times a day (QID) | ORAL | Status: DC | PRN

## 2015-06-25 MED ORDER — TRIAZOLAM 0.25 MG PO TABS: 0.2500 mg | ORAL_TABLET | Freq: Every day | ORAL | Status: DC

## 2015-06-25 MED ORDER — ONDANSETRON HCL 4 MG/2ML IJ SOLN: 4.0000 mg | Freq: Four times a day (QID) | INTRAMUSCULAR | Status: DC | PRN

## 2015-06-25 MED ORDER — SODIUM CHLORIDE 0.9 % IV SOLN
INTRAVENOUS | Status: DC
  Administered 2015-06-25: 17:00:00 via INTRAVENOUS

## 2015-06-25 MED ORDER — TRIAZOLAM 0.125 MG PO TABS
0.2500 mg | ORAL_TABLET | Freq: Every day | ORAL | Status: DC
  Administered 2015-06-25 – 2015-06-28 (×4): 0.25 mg via ORAL
  Filled 2015-06-25 (×4): qty 2

## 2015-06-25 MED ORDER — ACETAMINOPHEN 650 MG RE SUPP: 650.0000 mg | Freq: Four times a day (QID) | RECTAL | Status: DC | PRN

## 2015-06-25 MED ORDER — SODIUM CHLORIDE 0.9 % IJ SOLN
3.0000 mL | Freq: Two times a day (BID) | INTRAMUSCULAR | Status: DC
  Administered 2015-06-27 – 2015-06-29 (×4): 3 mL via INTRAVENOUS

## 2015-06-25 MED ORDER — LEVOTHYROXINE SODIUM 100 MCG IV SOLR
25.0000 ug | Freq: Every day | INTRAVENOUS | Status: DC
  Administered 2015-06-26: 25 ug via INTRAVENOUS
  Filled 2015-06-25 (×2): qty 5

## 2015-06-25 MED ORDER — PANTOPRAZOLE SODIUM 40 MG IV SOLR
40.0000 mg | Freq: Once | INTRAVENOUS | Status: AC
  Administered 2015-06-25: 40 mg via INTRAVENOUS
  Filled 2015-06-25: qty 40

## 2015-06-25 MED ORDER — SODIUM CHLORIDE 0.9 % IV BOLUS (SEPSIS)
1000.0000 mL | Freq: Once | INTRAVENOUS | Status: AC
  Administered 2015-06-25: 1000 mL via INTRAVENOUS

## 2015-06-25 NOTE — ED Notes (Addendum)
Pt had colonoscopy on Tuesday. Yesterday started having GI bleeding with BM several times yesterday. This morning x 1, was dark with small clots. Today was small amount of blood. Takes coumadin and lovenox.

## 2015-06-25 NOTE — Progress Notes (Signed)
Pt having bleeding after colonoscopy /polypectomy 12/6.  On lovenox waitng for coumadin to be therapeutic.  S/p st jude  AVR Stable  bp and pulse. .  hgb 10, no comps.  inr 1.6.   Will notify Dr Laurena Bering Glyn Ade PA-c

## 2015-06-25 NOTE — ED Provider Notes (Signed)
CSN: [id number]     Arrival date & time 06/25/15  1243 History   First MD Initiated Contact with Patient 06/25/15 1514     Chief Complaint  Patient presents with  . GI Bleeding    HPI  Elizabeth Morton is an 59 y.o. female with history of AVR, CVA, CAD (CABG 2006), PVD, GERD who presents to the ED for evaluation of GI bleed. Pt states she had a colonoscopy on 12/6 with large sessile polypectomy. Initially post-op had gone well. Pt reports yesterday AM she began feeling very gassy with numerous episodes of flatus. Then around 2PM she had an episode of non-painful BRBPR that filled the toilet bowl. States she saw some dark colored clots as well. She reports this happened about 4-5 times total yesterday and once this morning. Denies any n/v or abdominal pain. She states she does feel a little fatigued. Of note pt is on long-term anticoagulation. She is on warfarin, but had been taking SQ Lovenox for the past week for her procedure. She states she took her last dose of lovenox this AM and [facility] was 1.5. Denies prior hx of GI bleed. Denies chest pain, SOB, fever, chills, LOC. Pt sees Dr. Henrene Pastor for GI, Dr. Nyoka Cowden for cards.   Past Medical History  Diagnosis Date  . Warfarin anticoagulation     coumadin therapy  . Hypertension   . Hx of CABG     2006,LIMA to LAD, SVG to diagonal  . 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 Dyslipidemia   . CAD (coronary artery disease)   . Heart valve replaced by other means 03/2005    bentall procedure.#21 st.jude mechanical vavle conduit with re- implantation of coronaries. good valve function ..echo 10/09,mechanical valve working well.. echo 10/11.  . S/P aortic valve replacement     Bental procedure,#21 St. Jude mechanical valve conduit with reimplantation of the coronaries 2006 / valve working well, echo, October, 2009 / valve working well echo, October, 2011  . Indigestion 10/11  . Aortic stenosis    Aortic valve replacement 2006  . Ejection fraction     60%, echo, 2009 / 55-60%, echo, October, 2011  . Ecchymosis     From Coumadin and aspirin  . Preop cardiovascular exam     Cardiac clearance for gallbladder surgery November, 2013  . Pericardial effusion     Insignificant small pericardial effusion seen on echo, November, 2013  . SOB (shortness of breath)     none now  . Heart murmur     hx  . Stroke ([place]) 06    lft weaker  . Peripheral vascular disease ([place]) 06    blood clot -stroke  . GERD (gastroesophageal reflux disease)   . Gallstones   . Anemia     iron deficiency   Past Surgical History  Procedure Laterality Date  . Coronary artery bypass graft    . Cardiac valve replacement      aortic  . Tonsillectomy    . Lumbar fusion      8th grade  . Laparoscopic cholecystectomy single port  07/21/2012    Procedure: LAPAROSCOPIC CHOLECYSTECTOMY SINGLE PORT;  Surgeon: Adin Hector, MD;  Location: [facility];  Service: General;  Laterality: N/A;   Family History  Problem Relation Age of Onset  . Stroke Mother   . Heart disease Mother   . Alcohol abuse Father   . Hypertension Father   .  Stroke Father   . Hyperlipidemia Father   . Stroke Brother     she has three and at least one of them has had a stroke  . Colon cancer Neg Hx    Social History  Substance Use Topics  . Smoking status: Former Smoker -- 1.00 packs/day for 25 years    Types: Cigarettes    Quit date: 07/15/2004  . Smokeless tobacco: Never Used     Comment: quit 2006  . Alcohol Use: No   OB History    No data available     Review of Systems  All other systems reviewed and are negative.     Allergies  Review of patient's allergies indicates no known allergies.  Home Medications   Prior to Admission medications   Medication Sig Start Date End Date Taking? Authorizing Provider  Calcium Carbonate-Vitamin D (CALCIUM 600+D) 600-400 MG-UNIT per tablet Take 1 tablet by mouth 2 (two) times daily.    Yes Historical Provider, MD  enoxaparin (LOVENOX) 80 MG/0.8ML injection Inject 0.8 mLs (80 mg total) into the skin every 12 (twelve) hours. As Instructed by [facility] Clinic 06/10/15  Yes Dorothy Spark, MD  ferrous sulfate 325 (65 FE) MG tablet Take 1 tablet (325 mg total) by mouth daily with breakfast. 04/18/15  Yes Manus Gunning, MD  furosemide (LASIX) 20 MG tablet Take 20 mg by mouth daily.   Yes Historical Provider, MD  levothyroxine (SYNTHROID, LEVOTHROID) 50 MCG tablet Take 50 mcg by mouth daily before breakfast.   Yes Historical Provider, MD  omeprazole (PRILOSEC) 20 MG capsule Take 20 mg by mouth daily.     Yes Historical Provider, MD  polyethylene glycol powder (GLYCOLAX/MIRALAX) powder Take 17grams or One capful daily. 04/18/15  Yes Manus Gunning, MD  potassium chloride (MICRO-K) 10 MEQ CR capsule TAKE 1 TABLET BY MOUTH EVERY DAY 05/02/14  Yes Carlena Bjornstad, MD  Simethicone (PHAZYME PO) Take 250 mg by mouth daily as needed (gas).    Yes Historical Provider, MD  simvastatin (ZOCOR) 40 MG tablet Take 1 tablet (40 mg total) by mouth at bedtime. 07/14/13  Yes Carlena Bjornstad, MD  triazolam (HALCION) 0.25 MG tablet Take 0.25 mg by mouth at bedtime.   Yes Historical Provider, MD  warfarin (COUMADIN) 3 MG tablet TAKE AS DIRECTED BY COUMADIN CLINIC Patient taking differently: TAKE AS DIRECTED BY COUMADIN CLINIC. pt takes 1.5mg  on MWF, takes 3mg  on SuTuThSa 01/15/15  Yes Carlena Bjornstad, MD   BP 99/66 mmHg  Pulse 89  Temp(Src) 98.7 F (37.1 C) (Oral)  Resp 16  SpO2 100% Physical Exam  Constitutional: She is oriented to person, place, and time. No distress.  HENT:  Right Ear: External ear normal.  Left Ear: External ear normal.  Nose: Nose normal.  Mouth/Throat: Oropharynx is clear and moist. No oropharyngeal exudate.  Eyes: Conjunctivae and EOM are normal. Pupils are equal, round, and reactive to light.  Neck: Normal range of motion. Neck supple.  Cardiovascular: Normal  rate, regular rhythm, normal heart sounds and intact distal pulses.   Pulmonary/Chest: Effort normal and breath sounds normal. No respiratory distress. She exhibits no tenderness.  Abdominal: Soft. Bowel sounds are normal. She exhibits no distension. There is no tenderness. There is no rebound and no guarding. A hernia is present.  Genitourinary:  Gross blood clots on DRE. Nontender. No masses palpable.   Musculoskeletal: She exhibits no edema or tenderness.  Neurological: She is alert and oriented to person, place, and  time. No cranial nerve deficit.  Skin: Skin is warm and dry. She is not diaphoretic. There is pallor.  Psychiatric: She has a normal mood and affect.  Nursing note and vitals reviewed.   ED Course  Procedures (including critical care time) Labs Review Labs Reviewed  COMPREHENSIVE METABOLIC PANEL - Abnormal; Notable for the following:    Potassium 3.2 (*)    Glucose, Bld 127 (*)    BUN 24 (*)    Creatinine, Ser 1.25 (*)    GFR calc non Af Amer 46 (*)    GFR calc Af Amer 54 (*)    All other components within normal limits  CBC - Abnormal; Notable for the following:    RBC 3.66 (*)    Hemoglobin 10.0 (*)    HCT 30.4 (*)    RDW 18.9 (*)    All other components within normal limits  PROTIME-INR - Abnormal; Notable for the following:    Prothrombin Time 20.0 (*)    INR 1.70 (*)    All other components within normal limits  CBC WITH DIFFERENTIAL/PLATELET - Abnormal; Notable for the following:    RBC 3.36 (*)    Hemoglobin 9.1 (*)    HCT 28.0 (*)    RDW 19.1 (*)    All other components within normal limits  POC OCCULT BLOOD, ED - Abnormal; Notable for the following:    Fecal Occult Bld POSITIVE (*)    All other components within normal limits  OCCULT BLOOD X 1 CARD TO LAB, STOOL  TYPE AND SCREEN  ABO/RH    Imaging Review No results found. I have personally reviewed and evaluated these images and lab results as part of my medical decision-making.   EKG  Interpretation None      MDM   Final diagnoses:  Gastrointestinal hemorrhage, unspecified gastritis, unspecified gastrointestinal hemorrhage type    H/H 10.0/30.4. Gross blood clots on DRE, FOBT positive. Given recent colonoscopy with polypectomy I suspect this is the source of pt's bleeding. Pt is not having any abd pain, N/V so will hold on CT abd/pelvis for now. Pt is subtherapeutic on coumadin but she took 80mg  lovenox this AM. Spoke to hospitalist for admission. They also requested I page GI. Spoke to on-call for Dr. Blanch Media office who will come see pt in the AM as she is currently stable. Pt started on 1L bolus and IV protonix. Will admit to medicine.     Anne Ng, PA-C 06/26/15 1445  Charlesetta Shanks, MD 06/27/15 [phone]

## 2015-06-25 NOTE — Telephone Encounter (Signed)
Pt had colon done with Dr. Henrene Pastor 06/18/15. Pt states that yesterday she passed a lot of gas and then quite a bit of dark colored maroon blood. This morning pt states she had a BM and there were clots present in the stool. Pt was concerned and wanted to know if this was normal this far out. Discussed with pt coming in a seeing PA this afternoon but she states it isn't convenient for her, states she has to have her INR checked today. Dr. Henrene Pastor please advise.

## 2015-06-25 NOTE — ED Notes (Signed)
IV attempted x's 3 without any success.  2nd RN will attempt

## 2015-06-25 NOTE — Telephone Encounter (Signed)
This is not normal. This is a post-polypectomy bleed in a high risk right colon lesion in a patient on anticoagulation. She should go to the hospital.

## 2015-06-25 NOTE — Telephone Encounter (Signed)
Spoke with pt and she is aware that she needs to go to the ER to be evaluated.

## 2015-06-25 NOTE — H&P (Signed)
[facility] History and Physical                                                                                    Elizabeth Morton, is a 59 y.o. female  MRN: [id number]   DOB - 25-Dec-1955  Admit Date - 06/25/2015  Outpatient Primary MD for the patient is GREEN, Keenan Bachelor, MD  Referring MD: Johnney Killian / ER  Consulting M.D: Fara Olden with GI  With History of -  Past Medical History  Diagnosis Date  . Warfarin anticoagulation     coumadin therapy  . Hypertension   . Hx of CABG     2006,LIMA to LAD, SVG to diagonal  . 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 Dyslipidemia   . CAD (coronary artery disease)   . Heart valve replaced by other means 03/2005    bentall procedure.#21 st.jude mechanical vavle conduit with re- implantation of coronaries. good valve function ..echo 10/09,mechanical valve working well.. echo 10/11.  . S/P aortic valve replacement     Bental procedure,#21 St. Jude mechanical valve conduit with reimplantation of the coronaries 2006 / valve working well, echo, October, 2009 / valve working well echo, October, 2011  . Indigestion 10/11  . Aortic stenosis     Aortic valve replacement 2006  . Ejection fraction     60%, echo, 2009 / 55-60%, echo, October, 2011  . Ecchymosis     From Coumadin and aspirin  . Preop cardiovascular exam     Cardiac clearance for gallbladder surgery November, 2013  . Pericardial effusion     Insignificant small pericardial effusion seen on echo, November, 2013  . SOB (shortness of breath)     none now  . Heart murmur     hx  . Stroke ([place]) 06    lft weaker  . Peripheral vascular disease ([place]) 06    blood clot -stroke  . GERD (gastroesophageal reflux disease)   . Gallstones   . Anemia     iron deficiency      Past Surgical History  Procedure Laterality Date  . Coronary artery bypass graft    . Cardiac valve replacement      aortic  . Tonsillectomy    . Lumbar fusion     8th grade  . Laparoscopic cholecystectomy single port  07/21/2012    Procedure: LAPAROSCOPIC CHOLECYSTECTOMY SINGLE PORT;  Surgeon: Adin Hector, MD;  Location: [facility];  Service: General;  Laterality: N/A;    in for   Chief Complaint  Patient presents with  . GI Bleeding     HPI This is a very pleasant 59 year old female patient on chronic anticoagulation secondary to aortic valve replacement and Bental procedure in 2006 as well as CABG, hypertension, CVA related to thrombus aortic root aneurysm, dyslipidemia and chronic diastolic dysfunction. Patient underwent routine screening colonoscopy with polypectomy on 12/6. Prior to procedure her Coumadin had been stopped and she had been placed on Lovenox as bridging therapy. On 12/12 patient began to feel very gassy early in the morning and distended and was passing quite a large amount of flatus.  Around 2 PM she had a nonpainful bowel movement that was bright red blood. She described this as a "spigot opening up". After that she had several more bowel movements a total of about 5 some with clotting material. Today she's only had 1 bowel movement. She took her Lovenox as usual at 10 AM. Her INR today at the Coumadin clinic was 1.5. She reports no prior incidence of GI bleeding. Patient did telephone the gastric gastroenterology office to inform them of her clinical changes. She was instructed to come to the ER for further evaluation.  In the ER she was afebrile,  BP of 103/61, pulse was 93 and respirations 15 and her saturations were 98%. Review of outpatient documentation patient's blood pressures typically much higher around 132/74. Y panel revealed potassium 3.2, BUN 24 with baseline BUN 9, creatinine 1.25 is also slightly elevated for this patient with her typical baseline of 1.04. Last known hemoglobin was obtained in 2014 was 12.8, hemoglobin today was 10.0. Platelets are normal. INR subtherapeutic at 1.6. The present time patient is primarily  complaining of being very thirsty.   Review of Systems   In addition to the HPI above,  No Fever-chills, myalgias or other constitutional symptoms No Headache, changes with Vision or hearing, new weakness, tingling, numbness in any extremity, No problems swallowing food or Liquids, indigestion/reflux No Chest pain, Cough or Shortness of Breath, palpitations, orthopnea or DOE No Abdominal pain, N/V No dysuria, hematuria or flank pain No new skin rashes, lesions, masses or bruises, No new joints pains-aches No recent weight gain or loss No polyuria, polydypsia or polyphagia,  *A full 10 point Review of Systems was done, except as stated above, all other Review of Systems were negative.  Social History Social History  Substance Use Topics  . Smoking status: Former Smoker -- 1.00 packs/day for 25 years    Types: Cigarettes    Quit date: 07/15/2004  . Smokeless tobacco: Never Used     Comment: quit 2006  . Alcohol Use: No    Resides at: Private residence  Lives with: Spouse  Ambulatory status: Without assistive devices   Family History Family History  Problem Relation Age of Onset  . Stroke Mother   . Heart disease Mother   . Alcohol abuse Father   . Hypertension Father   . Stroke Father   . Hyperlipidemia Father   . Stroke Brother     she has three and at least one of them has had a stroke  . Colon cancer Neg Hx      Prior to Admission medications   Medication Sig Start Date End Date Taking? Authorizing Provider  Calcium Carbonate-Vitamin D (CALCIUM 600+D) 600-400 MG-UNIT per tablet Take 1 tablet by mouth 2 (two) times daily.   Yes Historical Provider, MD  enoxaparin (LOVENOX) 80 MG/0.8ML injection Inject 0.8 mLs (80 mg total) into the skin every 12 (twelve) hours. As Instructed by [facility]in Clinic 06/10/15  Yes Dorothy Spark, MD  ferrous sulfate 325 (65 FE) MG tablet Take 1 tablet (325 mg total) by mouth daily with breakfast. 04/18/15  Yes Manus Gunning,  MD  furosemide (LASIX) 20 MG tablet Take 20 mg by mouth daily.   Yes Historical Provider, MD  levothyroxine (SYNTHROID, LEVOTHROID) 50 MCG tablet Take 50 mcg by mouth daily before breakfast.   Yes Historical Provider, MD  omeprazole (PRILOSEC) 20 MG capsule Take 20 mg by mouth daily.     Yes Historical Provider, MD  polyethylene glycol  powder (GLYCOLAX/MIRALAX) powder Take 17grams or One capful daily. 04/18/15  Yes Manus Gunning, MD  potassium chloride (MICRO-K) 10 MEQ CR capsule TAKE 1 TABLET BY MOUTH EVERY DAY 05/02/14  Yes Carlena Bjornstad, MD  Simethicone (PHAZYME PO) Take 250 mg by mouth daily as needed (gas).    Yes Historical Provider, MD  simvastatin (ZOCOR) 40 MG tablet Take 1 tablet (40 mg total) by mouth at bedtime. 07/14/13  Yes Carlena Bjornstad, MD  triazolam (HALCION) 0.25 MG tablet Take 0.25 mg by mouth at bedtime.   Yes Historical Provider, MD  warfarin (COUMADIN) 3 MG tablet TAKE AS DIRECTED BY COUMADIN CLINIC Patient taking differently: TAKE AS DIRECTED BY COUMADIN CLINIC. pt takes 1.5mg  on MWF, takes 3mg  on SuTuThSa 01/15/15  Yes Carlena Bjornstad, MD    No Known Allergies  Physical Exam  Vitals  Blood pressure 106/51, pulse 79, temperature 98.7 F (37.1 C), temperature source Oral, resp. rate 16, SpO2 96 %.   General:  In no acute distress, appears healthy and well nourished  Psych:  Normal affect, Denies Suicidal or Homicidal ideations, Awake Alert, Oriented X 3. Speech and thought patterns are clear and appropriate, no apparent short term memory deficits  Neuro:   No focal neurological deficits, CN II through XII intact, Strength 5/5 all 4 extremities, Sensation intact all 4 extremities.  ENT:  Ears and Eyes appear Normal, Conjunctivae clear but pale, PER. Moist oral mucosa without erythema or exudates.  Neck:  Supple, No lymphadenopathy appreciated  Respiratory:  Symmetrical chest wall movement, Good air movement bilaterally, CTAB. Room Air  Cardiac:  RRR, No  Murmurs, appropriate valvular click appreciated, no LE edema noted, no JVD, No carotid bruits, peripheral pulses palpable at 2+  Abdomen:  Positive bowel sounds, Soft, Non tender, somewhat distended,  No masses appreciated, no obvious hepatosplenomegaly  Skin:  No Cyanosis, Normal Skin Turgor, No Skin Rash or Bruise.  Extremities: Symmetrical without obvious trauma or injury,  no effusions.  Data Review  CBC  Recent Labs Lab 06/25/15 1329  WBC 8.8  HGB 10.0*  HCT 30.4*  PLT 348  MCV 83.1  MCH 27.3  MCHC 32.9  RDW 18.9*    Chemistries   Recent Labs Lab 06/25/15 1329  NA 137  K 3.2*  CL 103  CO2 23  GLUCOSE 127*  BUN 24*  CREATININE 1.25*  CALCIUM 9.8  AST 26  ALT 26  ALKPHOS 66  BILITOT 0.5    estimated creatinine clearance is 45.5 mL/min (by C-G formula based on Cr of 1.25).  No results for input(s): TSH, T4TOTAL, T3FREE, THYROIDAB in the last 72 hours.  Invalid input(s): FREET3  Coagulation profile  Recent Labs Lab 06/21/15 1222 06/25/15 1219  INR 1.3 1.6    No results for input(s): DDIMER in the last 72 hours.  Cardiac Enzymes No results for input(s): CKMB, TROPONINI, MYOGLOBIN in the last 168 hours.  Invalid input(s): CK  Invalid input(s): POCBNP  Urinalysis No results found for: COLORURINE, APPEARANCEUR, LABSPEC, PHURINE, GLUCOSEU, HGBUR, BILIRUBINUR, KETONESUR, PROTEINUR, UROBILINOGEN, NITRITE, LEUKOCYTESUR  Imaging results:   Dg Bone Density  06/14/2015  EXAM: DUAL X-RAY ABSORPTIOMETRY (DXA) FOR BONE MINERAL DENSITY IMPRESSION: Referring Physician:  Lance Muss PATIENT: Name: Morton, Elizabeth Morton Patient ID: [id number] Birth Date: [birth year] Height: 62.0 in. Sex: Female Measured: 06/14/2015 Weight: 161.0 lbs. Indications: Estrogen Deficient, Postmenopausal, Prilosec, Synthroid Fractures: Treatments: Calcium (E943.0), Vitamin D (E933.5) ASSESSMENT: The BMD measured at AP Spine L1-L2 is 0.868 g/cm2 with  a T-score of -2.5. This patient is  considered osteoporotic according to [facility]) criteria. L-3, L-4 was excluded due to degenerative changes. Site Region Measured Date Measured Age YA BMD Significant CHANGE T-score AP Spine  L1-L2     06/14/2015    59.0         -2.5    0.868 g/cm2 DualFemur Neck Left 06/14/2015    59.0         -1.8    0.783 g/cm2 World Health Organization [facility]) criteria for post-menopausal, Caucasian Women: Normal       T-score at or above -1 SD Osteopenia   T-score between -1 and -2.5 SD Osteoporosis T-score at or below -2.5 SD RECOMMENDATION: [place] recommends that FDA-approved medical therapies be considered in postmenopausal women and men age 50 or older with a: 1. Hip or vertebral (clinical or morphometric) fracture. 2. T-score of <-2.5 at the spine or hip. 3. Ten-year fracture probability by FRAX of 3% or greater for hip fracture or 20% or greater for major osteoporotic fracture. All treatment decisions require clinical judgment and consideration of individual patient factors, including patient preferences, co-morbidities, previous drug use, risk factors not captured in the FRAX model (e.g. falls, vitamin D deficiency, increased bone turnover, interval significant decline in bone density) and possible under - or over-estimation of fracture risk by FRAX. All patients should ensure an adequate intake of dietary calcium (1200 mg/d) and vitamin D (800 IU daily) unless contraindicated. FOLLOW-UP: People with diagnosed cases of osteoporosis or at high risk for fracture should have regular bone mineral density tests. For patients eligible for Medicare, routine testing is allowed once every 2 years. The testing frequency can be increased to one year for patients who have rapidly progressing disease, those who are receiving or discontinuing medical therapy to restore bone mass, or have additional risk factors. I have reviewed this report, and agree with the above findings. [facility]  Radiology Electronically Signed   By: Earle Gell M.D.   On: 06/14/2015 12:47   Mm Digital Screening Bilateral  06/05/2015  CLINICAL DATA:  Screening. EXAM: DIGITAL SCREENING BILATERAL MAMMOGRAM WITH CAD COMPARISON:  Previous exam(s). ACR Breast Density Category b: There are scattered areas of fibroglandular density. FINDINGS: There are no findings suspicious for malignancy. Images were processed with CAD. IMPRESSION: No mammographic evidence of malignancy. A result letter of this screening mammogram will be mailed directly to the patient. RECOMMENDATION: Screening mammogram in one year. (Code:SM-B-01Y) BI-RADS CATEGORY  1: Negative. Electronically Signed   By: Lillia Mountain M.D.   On: 06/05/2015 09:43     Assessment & Plan  Principal Problem:   Acute GI bleeding -Telemetry -Suspect related to recent colonoscopy with polypectomy in setting of full dose Lovenox -BUN is elevated but no recent risk factors suggestive of upper GI bleeding such as excessive NSAID use -Was given IV Protonix in the ER  Active Problems:   Warfarin anticoagulation/S/P aortic valve replacement -At home had been on full dose Lovenox subcutaneous twice a day for bridging as Coumadin was being resumed and managed by Coumadin clinic -Last dose Lovenox 10 AM today -I did discuss with GI PA-C Tressia Miners has discussed this with Dr. Carlean Purl her attending-their recommendation is to hold Lovenox for now since aortic valve low risk for acute stroke with short-term cessation of anticoagulation-they will see the patient later in formal consultation   Acute blood loss anemia -In setting of GI bleed -Was given a liter of IV fluid  in the ER so we'll check CBC now and repeat in a.m. -History of CAD so transfuse if hemoglobin less than or equal to 8 -Baseline hemoglobin uncertain; last documented was in 2014 and hemoglobin was 12.8 with current hemoglobin 10 at admission    Hypertension -Current blood pressure soft in setting  of suspected acute blood loss anemia -Holding preadmission medications    Chronic congestive heart failure with left ventricular diastolic dysfunction  -Lasix and any antihypertensive medications on hold (see above)    Dyslipidemia  -Preadmission Zocor on hold    Hypothyroidism -Continue Synthroid    DVT Prophylaxis: SCDs  Family Communication:   No family at bedside  Code Status: Full code   Condition:  Stable  Discharge disposition: Anticipate discharge back to previous home environment once medically stable  Time spent in minutes : 60      ELLIS,ALLISON L. ANP on 06/25/2015 at 4:48 PM  You may contact me by going to [email or web address] - password [email or web address]  I am available from 7a-7p but please confirm I am on the schedule by going to Amion as above.   After 7p please contact night coverage person covering me after hours  [facility]

## 2015-06-26 ENCOUNTER — Encounter (HOSPITAL_COMMUNITY)
Admission: EM | Disposition: A | Discharge status: Home / Self Care | Payer: Self-pay | Source: Home / Self Care | Attending: Internal Medicine

## 2015-06-26 ENCOUNTER — Encounter (HOSPITAL_COMMUNITY): Payer: Self-pay

## 2015-06-26 DIAGNOSIS — Z7901 Long term (current) use of anticoagulants: Secondary | ICD-10-CM

## 2015-06-26 DIAGNOSIS — K922 Gastrointestinal hemorrhage, unspecified: Secondary | ICD-10-CM

## 2015-06-26 DIAGNOSIS — D62 Acute posthemorrhagic anemia: Secondary | ICD-10-CM

## 2015-06-26 DIAGNOSIS — I5032 Chronic diastolic (congestive) heart failure: Secondary | ICD-10-CM

## 2015-06-26 DIAGNOSIS — E876 Hypokalemia: Secondary | ICD-10-CM | POA: Diagnosis present

## 2015-06-26 HISTORY — PX: COLONOSCOPY: SHX5424

## 2015-06-26 LAB — BASIC METABOLIC PANEL
ANION GAP: 7 (ref 5–15)
BUN: 21 mg/dL — ABNORMAL HIGH (ref 6–20)
CHLORIDE: 110 mmol/L (ref 101–111)
CO2: 23 mmol/L (ref 22–32)
Calcium: 8.5 mg/dL — ABNORMAL LOW (ref 8.9–10.3)
Creatinine, Ser: 1.08 mg/dL — ABNORMAL HIGH (ref 0.44–1.00)
GFR, EST NON AFRICAN AMERICAN: 55 mL/min — AB (ref >60)
Glucose, Bld: 106 mg/dL — ABNORMAL HIGH (ref 65–99)
POTASSIUM: 3.2 mmol/L — AB (ref 3.5–5.1)
SODIUM: 140 mmol/L (ref 135–145)

## 2015-06-26 LAB — CBC
HCT: 29.2 % — ABNORMAL LOW (ref 36.0–46.0)
HEMATOCRIT: 22.8 % — AB (ref 36.0–46.0)
HEMOGLOBIN: 7.3 g/dL — AB (ref 12.0–15.0)
Hemoglobin: 9.5 g/dL — ABNORMAL LOW (ref 12.0–15.0)
MCH: 26.8 pg (ref 26.0–34.0)
MCH: 26.8 pg (ref 26.0–34.0)
MCHC: 32 g/dL (ref 30.0–36.0)
MCHC: 32.5 g/dL (ref 30.0–36.0)
MCV: 83.8 fL (ref 78.0–100.0)
MCV: 82.5 fL (ref 78.0–100.0)
PLATELETS: 244 10*3/uL (ref 150–400)
Platelets: 273 10*3/uL (ref 150–400)
RBC: 2.72 MIL/uL — AB (ref 3.87–5.11)
RBC: 3.54 MIL/uL — ABNORMAL LOW (ref 3.87–5.11)
RDW: 19.3 % — AB (ref 11.5–15.5)
RDW: 18.2 % — AB (ref 11.5–15.5)
WBC: 7.1 10*3/uL (ref 4.0–10.5)
WBC: 10.5 10*3/uL (ref 4.0–10.5)

## 2015-06-26 LAB — PROTIME-INR
INR: 1.75 — AB (ref 0.00–1.49)
INR: 1.38 (ref 0.00–1.49)
PROTHROMBIN TIME: 17 s — AB (ref 11.6–15.2)
Prothrombin Time: 20.4 seconds — ABNORMAL HIGH (ref 11.6–15.2)

## 2015-06-26 LAB — PREPARE RBC (CROSSMATCH)

## 2015-06-26 LAB — HEMOGLOBIN AND HEMATOCRIT, BLOOD
HEMATOCRIT: 24.2 % — AB (ref 36.0–46.0)
HEMOGLOBIN: 8 g/dL — AB (ref 12.0–15.0)

## 2015-06-26 SURGERY — COLONOSCOPY
Anesthesia: Moderate Sedation

## 2015-06-26 MED ORDER — DIPHENHYDRAMINE HCL 50 MG/ML IJ SOLN
INTRAMUSCULAR | Status: AC
  Filled 2015-06-26: qty 1

## 2015-06-26 MED ORDER — SODIUM CHLORIDE 0.9 % IV BOLUS (SEPSIS): 500.0000 mL | Freq: Once | INTRAVENOUS | Status: DC

## 2015-06-26 MED ORDER — SODIUM CHLORIDE 0.9 % IV SOLN
INTRAVENOUS | Status: DC
  Administered 2015-06-26: 500 mL via INTRAVENOUS
  Administered 2015-06-26: 11:00:00 via INTRAVENOUS

## 2015-06-26 MED ORDER — POLYETHYLENE GLYCOL 3350 17 GM/SCOOP PO POWD
1.0000 | Freq: Once | ORAL | Status: AC
  Administered 2015-06-26: 255 g via ORAL
  Filled 2015-06-26 (×2): qty 255

## 2015-06-26 MED ORDER — FENTANYL CITRATE (PF) 100 MCG/2ML IJ SOLN
INTRAMUSCULAR | Status: AC
  Filled 2015-06-26: qty 2

## 2015-06-26 MED ORDER — FENTANYL CITRATE (PF) 100 MCG/2ML IJ SOLN
INTRAMUSCULAR | Status: DC | PRN
  Administered 2015-06-26: 25 ug via INTRAVENOUS
  Administered 2015-06-26: 12.5 ug via INTRAVENOUS
  Administered 2015-06-26: 25 ug via INTRAVENOUS

## 2015-06-26 MED ORDER — POTASSIUM CHLORIDE CRYS ER 20 MEQ PO TBCR
20.0000 meq | EXTENDED_RELEASE_TABLET | Freq: Once | ORAL | Status: AC
  Administered 2015-06-26: 20 meq via ORAL
  Filled 2015-06-26: qty 1

## 2015-06-26 MED ORDER — MIDAZOLAM HCL 5 MG/5ML IJ SOLN
INTRAMUSCULAR | Status: DC | PRN
  Administered 2015-06-26 (×2): 2 mg via INTRAVENOUS
  Administered 2015-06-26: 1 mg via INTRAVENOUS

## 2015-06-26 MED ORDER — SODIUM CHLORIDE 0.9 % IJ SOLN
PREFILLED_SYRINGE | INTRAMUSCULAR | Status: DC | PRN
  Administered 2015-06-26: 8.5 mL

## 2015-06-26 MED ORDER — EPINEPHRINE HCL 0.1 MG/ML IJ SOSY
PREFILLED_SYRINGE | INTRAMUSCULAR | Status: AC
  Filled 2015-06-26: qty 10

## 2015-06-26 MED ORDER — MIDAZOLAM HCL 5 MG/ML IJ SOLN
INTRAMUSCULAR | Status: AC
  Filled 2015-06-26: qty 2

## 2015-06-26 MED ORDER — SODIUM CHLORIDE 0.9 % IV SOLN: Freq: Once | INTRAVENOUS | Status: DC

## 2015-06-26 MED ORDER — SODIUM CHLORIDE 0.9 % IV SOLN
Freq: Once | INTRAVENOUS | Status: AC
  Administered 2015-06-26: 11:00:00 via INTRAVENOUS

## 2015-06-26 MED ORDER — ONDANSETRON HCL 4 MG/2ML IJ SOLN
INTRAMUSCULAR | Status: DC | PRN
  Administered 2015-06-26: 4 mg via INTRAVENOUS

## 2015-06-26 MED ORDER — ONDANSETRON HCL 4 MG/2ML IJ SOLN
INTRAMUSCULAR | Status: AC
  Filled 2015-06-26: qty 2

## 2015-06-26 NOTE — Progress Notes (Signed)
Pt went to ER from INR check.  Had drop in Hg and was admitted.  As of 12/14 warfarin and lovenox on hold, pt scheduled for repeat colonoscopy

## 2015-06-26 NOTE — Consult Note (Signed)
[facility] Consult: 8:21 AM 06/26/2015  LOS: 1 day    Referring Provider: Dr Clementeen Graham  Primary Care Physician:  [name], MD Primary Gastroenterologist:  Dr. Havery Moros   Reason for Consultation:  Post polypectomy bleed.    HPI: Elizabeth Morton is a 59 y.o. female.  Chronic Coumadin for s/p 2006 CABG and St Jude mechanical AVR/Bental aortic root replacement.  S/p   Hx 2006 CVA.  S/p cholecystectomy 08/2012.  Constipation. Iron def anemia (IDA)  06/18/15 Colonoscopy with polypectomy and EGD for IDA.  Dr Henrene Pastor piece meal removed large sessile polyp at ascending colon, taood the site and prophylactically placed endclip.  Polyp path: TVA.  EGD was normal (no duodenal biopsies).   Resumed Lovenox bridge and Coumadin 12/6.  Dr Henrene Pastor set up for a CT 12/16 to evaluate her ventral hernia   Tool lovenox on Monday am.  2 pm she had first of several bouts painless,large volume hematochezia. Took her coumadin that night.  .  None overnight.   Took her lovenox Tuesday am.  Recurrent, smaller hemchezia Tuesday am.  INR at clinic was 1.6.  Not dizzy or weak. Called md and advised to head to ED.   Hgb 10 at arrival, no comps for comparison.  Drifted to 7.3, got 1 unit PRBC and 2 of FFP. ,Hgb this AM is 8.0.  INR holding at 1.7.  No Vitamin K so far.  One large hematochezia this AM. Feels dizzy and weak.  Another PRBC ordered.        Past Medical History  Diagnosis Date  . Warfarin anticoagulation     coumadin therapy  . Hypertension   . Hx of CABG     2006,LIMA to LAD, SVG to diagonal  . 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 Dyslipidemia   . CAD (coronary artery disease)   . Heart valve replaced by other means 03/2005    bentall procedure.#21 st.jude mechanical vavle  conduit with re- implantation of coronaries. good valve function ..echo 10/09,mechanical valve working well.. echo 10/11.  . S/P aortic valve replacement     Bental procedure,#21 St. Jude mechanical valve conduit with reimplantation of the coronaries 2006 / valve working well, echo, October, 2009 / valve working well echo, October, 2011  . Indigestion 10/11  . Aortic stenosis     Aortic valve replacement 2006  . Ejection fraction     60%, echo, 2009 / 55-60%, echo, October, 2011  . Ecchymosis     From Coumadin and aspirin  . Preop cardiovascular exam     Cardiac clearance for gallbladder surgery November, 2013  . Pericardial effusion     Insignificant small pericardial effusion seen on echo, November, 2013  . SOB (shortness of breath)     none now  . Heart murmur     hx  . Peripheral vascular disease ([place]) 06    blood clot -stroke  . GERD (gastroesophageal reflux disease)   . Gallstones   . Hypothyroidism   . Iron  deficiency anemia   . Stroke [facility]) 2006    "left side weaker since" (06/25/2015)  . Status post colonoscopy with polypectomy     "bleeding; colonoscopy was ~ 06/18/2015"    Past Surgical History  Procedure Laterality Date  . Coronary artery bypass graft  2006  . Aortic valve replacement  2006  . Tonsillectomy    . Lumbar fusion  ~ 1973  . Laparoscopic cholecystectomy single port  07/21/2012    Procedure: LAPAROSCOPIC CHOLECYSTECTOMY SINGLE PORT;  Surgeon: Adin Hector, MD;  Location: [facility];  Service: General;  Laterality: N/A;  . Cardiac valve replacement    . Back surgery    . Cesarean section  1985  . Cardiac catheterization  ~ 2006  . Colonoscopy w/ biopsies and polypectomy  06/18/2015  . Esophagogastroduodenoscopy  06/18/2015    Prior to Admission medications   Medication Sig Start Date End Date Taking? Authorizing Provider  Calcium Carbonate-Vitamin D (CALCIUM 600+D) 600-400 MG-UNIT per tablet Take 1 tablet by mouth 2 (two) times daily.   Yes Historical  Provider, MD  enoxaparin (LOVENOX) 80 MG/0.8ML injection Inject 0.8 mLs (80 mg total) into the skin every 12 (twelve) hours. As Instructed by Coumadin Clinic 06/10/15  Yes Dorothy Spark, MD  ferrous sulfate 325 (65 FE) MG tablet Take 1 tablet (325 mg total) by mouth daily with breakfast. 04/18/15  Yes Manus Gunning, MD  furosemide (LASIX) 20 MG tablet Take 20 mg by mouth daily.   Yes Historical Provider, MD  levothyroxine (SYNTHROID, LEVOTHROID) 50 MCG tablet Take 50 mcg by mouth daily before breakfast.   Yes Historical Provider, MD  omeprazole (PRILOSEC) 20 MG capsule Take 20 mg by mouth daily.     Yes Historical Provider, MD  polyethylene glycol powder (GLYCOLAX/MIRALAX) powder Take 17grams or One capful daily. 04/18/15  Yes Manus Gunning, MD  potassium chloride (MICRO-K) 10 MEQ CR capsule TAKE 1 TABLET BY MOUTH EVERY DAY 05/02/14  Yes Carlena Bjornstad, MD  Simethicone (PHAZYME PO) Take 250 mg by mouth daily as needed (gas).    Yes Historical Provider, MD  simvastatin (ZOCOR) 40 MG tablet Take 1 tablet (40 mg total) by mouth at bedtime. 07/14/13  Yes Carlena Bjornstad, MD  triazolam (HALCION) 0.25 MG tablet Take 0.25 mg by mouth at bedtime.   Yes Historical Provider, MD  warfarin (COUMADIN) 3 MG tablet TAKE AS DIRECTED BY COUMADIN CLINIC Patient taking differently: TAKE AS DIRECTED BY COUMADIN CLINIC. pt takes 1.5mg  on MWF, takes 3mg  on SuTuThSa 01/15/15  Yes Carlena Bjornstad, MD    Scheduled Meds: . sodium chloride   Intravenous Once  . sodium chloride   Intravenous Once  . levothyroxine  25 mcg Intravenous Daily  . sodium chloride  3 mL Intravenous Q12H  . triazolam  0.25 mg Oral QHS   Infusions: . sodium chloride Stopped (06/26/15 0245)   PRN Meds: acetaminophen **OR** acetaminophen, ondansetron **OR** ondansetron (ZOFRAN) IV   Allergies as of 06/25/2015  . (No Known Allergies)    Family History  Problem Relation Age of Onset  . Stroke Mother   . Heart disease Mother    . Alcohol abuse Father   . Hypertension Father   . Stroke Father   . Hyperlipidemia Father   . Stroke Brother     she has three and at least one of them has had a stroke  . Colon cancer Neg Hx     Social History   Social History  .  Marital Status: Married    Spouse Name: N/A  . Number of Children: 1  . Years of Education: N/A   Occupational History  . homemaker    Social History Main Topics  . Smoking status: Former Smoker -- 1.00 packs/day for 25 years    Types: Cigarettes    Quit date: 07/15/2004  . Smokeless tobacco: Never Used  . Alcohol Use: No  . Drug Use: No  . Sexual Activity: No   Other Topics Concern  . Not on file   Social History Narrative   She lives in [place] with her husband and son. She previously smoked 37-pack-years,but quit following her stroke in April of 2006. She denies any alcohol or drugs. She has not been routinely exercising.    REVIEW OF SYSTEMS: Constitutional:  Generally good energy ENT:  No nose bleeds Pulm:  No SOB  CV:  No palpitations, no LE edema. No chest pain  GU:  No hematuria, no frequency GI:  Per HPI Heme: per HPI   Transfusions:  None before this Neuro:  No headaches, no peripheral tingling or numbness.  Some dizziness today.  No visual disturbance.  Derm:  No itching, no rash or sores.  Endocrine:  No sweats or chills.  No polyuria or dysuria Immunization:  Not queried Travel:  None beyond local counties in last few months.    PHYSICAL EXAM: Vital signs in last 24 hours: Filed Vitals:   06/26/15 0711 06/26/15 0817  BP: 94/42 93/48  Pulse: 80 80  Temp: 98.2 F (36.8 C) 98.2 F (36.8 C)  Resp: 16 18   Wt Readings from Last 3 Encounters:  06/25/15 71.804 kg (158 lb 4.8 oz)  06/18/15 73.483 kg (162 lb)  04/29/15 73.483 kg (162 lb)    General: pleasant, comfortable and well looking Head:  No asymmetry or swelling  Eyes:  No icterus, no pallor Ears:  Not HOH  Nose:  No congestion or discharge Mouth:   Moist and clear Neck:  No mass or JVD Lungs:  Clear bil.  Not SOB Heart: RRR with valve click.  Abdomen:  Soft, NT, ND.  No mass or HSM.  Active BS.   Rectal: deferred   Musc/Skeltl: no joint swelling or redness Extremities:  No CCE  Neurologic:  Pleasant, oriented x 3.  Alert.  Moves all limbs.  No tremors Skin:  No telangectasia, sores or rash Tattoos:  None  Nodes:  No cervical adenopathy   Psych:  Pleasant, calm, cooperative.   Intake/Output from previous day: 12/13 0701 - 12/14 0700 In: 1815 [I.V.:1000; Blood:815] Out: 1 [Stool:1] Intake/Output this shift:    LAB RESULTS:  Recent Labs  06/25/15 1329 06/25/15 1655 06/26/15 0305  WBC 8.8 8.0 7.1  HGB 10.0* 9.1* 7.3*  HCT 30.4* 28.0* 22.8*  PLT 348 318 273   BMET Lab Results  Component Value Date   NA 137 06/25/2015   NA 138 04/29/2015   NA 138 07/24/2012   K 3.2* 06/25/2015   K 3.7 04/29/2015   K 3.7 07/24/2012   CL 103 06/25/2015   CL 101 04/29/2015   CL 101 07/24/2012   CO2 23 06/25/2015   CO2 25 04/29/2015   CO2 25 07/24/2012   GLUCOSE 127* 06/25/2015   GLUCOSE 80 04/29/2015   GLUCOSE 116* 07/24/2012   BUN 24* 06/25/2015   BUN 9 04/29/2015   BUN 9 07/24/2012   CREATININE 1.25* 06/25/2015   CREATININE 1.04 04/29/2015   CREATININE 0.99 07/24/2012  CALCIUM 9.8 06/25/2015   CALCIUM 9.7 04/29/2015   CALCIUM 9.6 07/24/2012   LFT  Recent Labs  06/25/15 1329  PROT 6.7  ALBUMIN 3.9  AST 26  ALT 26  ALKPHOS 66  BILITOT 0.5   PT/INR Lab Results  Component Value Date   INR 1.75* 06/26/2015   INR 1.70* 06/25/2015   INR 1.6 06/25/2015   PROTIME 18.3 12/11/2008     IMPRESSION:   *  Post polypectomy bleed  *  06/18/15 colonoscopy with piece meal removal of large sessile TVA, tatooing and endoclip placement.   *  Iron def anemia.   *  Chronic Coumadin for mechanical AVR, hx CVA prior to AVR.  On LOvenox bridge and Coumadin post colonoscopy.  Lovenox/Coumadin on hold.   *  Ventral  hernia.   *  Hypokalemia.    PLAN:     *  Colonoscopy ~ 4:15 today.  Prep with miralax as the movi prep made her vomit last week.   *  ?Obtain inpt CT for eval of ventral hernia that was set for 12/16?  Pt requesting. .   *  Give 20 mg of po potassium now. I ordered this.      Azucena Freed  06/26/2015, 8:21 AM Pager: [phone]     [facility] Attending   I have taken an interval history, reviewed the chart and examined the patient. I agree with the Advanced Practitioner's note, impression and recommendations.   For colonoscopy today.  The risks and benefits as well as alternatives of endoscopic procedure(s) have been discussed and reviewed. All questions answered. The patient agrees to proceed.  Gatha Mayer, MD, [facility] [phone] (pager) [phone] after 5 PM, weekends and holidays  06/26/2015 2:16 PM

## 2015-06-26 NOTE — Progress Notes (Signed)
UR Completed. Kloie Whiting, RN, BSN.  [phone] 

## 2015-06-26 NOTE — Progress Notes (Signed)
Patient had a very large bloody stool with clots . Skin warm and dry. Resp. Even and unlab. See flow sheet for v.s.Resp even and unlab.  cLEAN PT. AND ASSIST BACK TO BED. nOW ON BEDREST. R.N. AWARE AND INTO SEE PATIENT. Marland KitchenTylene Fantasia paged and made aware of the above see orders.

## 2015-06-26 NOTE — Progress Notes (Addendum)
Melanie, LPN, paged this NP because pt had a very large dark red BM which filled and overflowed the hat into the commode.  Brief hx: Pt admitted 12/13 after having bloody stools at home. Pt had colonoscopy with polypectomy on 06/18/15 and was in the process of bridging back to Coumadin for her St Jude valve. Her GI doctor told her to come to ED. INR on admission was 1.7 and Hgb was 9.1.   This is the first bloody BM since admission. Her Lovenox to Coumadin bridge is presently on hold. BP 100s. Pt feels fine, no dizziness.  1. GIB with active bleeding-CBC and PT/INR stat. NPO. IVF to 125/hr. 500cc NS bolus (was ordered but d/c'd at this time because blood had already started). FFP 2U asap and I unit of PRBCs. H/H after transfusion. Stay 2 units ahead. Will need GI to see in am (are aware that pt is here).  Monitor for further bleeding and if BP drops or pt becomes symptomatic, low threshold to TF pt to SDU.  KJGK, NP [facility] Update: Pt in progress of one unit PRBCs. Hgb resulted at 7.3, so hopefully, she will be over 8 after one unit. Will recheck H/H after this unit finishes. She will likely need another unit since BM was so large. INR 1.75 and she will receive 2U FFP after PRBC finishes. Recheck INR later today. So far, her VS are stable and she remains non symptomatic. Will follow. KJKG, NP [facility]

## 2015-06-26 NOTE — Progress Notes (Addendum)
[facility] PROGRESS NOTE  Elizabeth Morton [id number] DOB: May 30, 1956 DOA: 06/25/2015 PCP: [name], MD  59 year old female with history of CVA, hyperlipidemia, CAD status post CABG, aortic St. Jude valve replacement on chronic anticoagulation with Coumadin, peripheral vascular disease, GERD, iron deficiency anemia who recently underwent EGD and colonoscopy on 06/18/2015 for iron deficiency anemia showing normal EGD and had a single systolic polyp removed for prophylactically placed End Clip. (Biopsy showing tubulovillous adenoma). postprocedure , Coumadin was resumed with Lovenox bridge. Since 12/12 patient had total 3 episodes of large amount of painless  bright red blood per rectum . She reported feeling dizzy and weak. Patient called her gastroenterologist and was advised to come to the ED. On arrival to the ED her hemoglobin was 10 with subsequent did drop to 7.3. She received 2 units FFP and 1 unit PRBC. INR of 1.75. [facility] consulted and patient admitted to hospitalist service on telemetry.   Assessment/Plan: Acute blood loss anemia secondary to lower GI bleed Continue telemetry monitoring. Drop in hemoglobin since admission and received 1 unit PRBC and 2 units FFP. Ordered another unit PRBC transfusion. Hemoglobin of 8 this morning. Continue IV PPI. Coumadin on hold. -[facility] falling with plan on repeat colonoscopy this afternoon. -monitor H&h q 6-8  hrs  Warfarin induced coagulopathy Patient on chronic Coumadin for aortic valve replacement and was getting Lovenox bridging for Coumadin following a recent EGD and colonoscopy.  Chronic CHF/CAD Currently euvolemic. Blood pressure medications on hold.  Dyslipidemia Resume Zocor after procedure.  Hypothyroidism   continue Synthroid  Hypertension Medications held  given soft blood pressure  hypokalemia Replenished  DVT prophylaxis: SCDs Diet: Nothing by mouth  Code Status: Full code Family Communication:  None at bedside Disposition Plan: Continue telemetry monitoring   Consultants:  [facility]  Procedures:  None  Antibiotics:  None  HPI/Subjective: NA examined. Had a large dark red bowel movement early this morning. Vitals stable. Received 2 units FFP and 1 unit PRBC.    Objective: Filed Vitals:   06/26/15 1040 06/26/15 1110  BP: 100/45 106/43  Pulse: 81 84  Temp: 97.7 F (36.5 C) 98.5 F (36.9 C)  Resp: 16 18    Intake/Output Summary (Last 24 hours) at 06/26/15 1206 Last data filed at 06/26/15 1110  Gross per 24 hour  Intake   2150 ml  Output      1 ml  Net   2149 ml   Filed Weights   06/25/15 2028  Weight: 71.804 kg (158 lb 4.8 oz)    Exam:   General: Elizabeth Morton not in distress  HEENT: Pallor present, moist mucosa, supple neck  Chest: Clear to auscultation bilaterally  CVS: S1 and S2, systolic click+, No murmurs  GI: Soft, nondistended, nontender, bowel sounds present  Musculoskeletal: Warm, no edema  CNS: Alert and oriented  Data Reviewed: Basic Metabolic Panel:  Recent Labs Lab 06/25/15 1329 06/26/15 0745  NA 137 140  K 3.2* 3.2*  CL 103 110  CO2 23 23  GLUCOSE 127* 106*  BUN 24* 21*  CREATININE 1.25* 1.08*  CALCIUM 9.8 8.5*   Liver Function Tests:  Recent Labs Lab 06/25/15 1329  AST 26  ALT 26  ALKPHOS 66  BILITOT 0.5  PROT 6.7  ALBUMIN 3.9   No results for input(s): LIPASE, AMYLASE in the last 168 hours. No results for input(s): AMMONIA in the last 168 hours. CBC:  Recent Labs Lab 06/25/15 1329 06/25/15 1655 06/26/15 0305 06/26/15 0745  WBC 8.8 8.0 7.1  --   NEUTROABS  --  5.3  --   --   HGB 10.0* 9.1* 7.3* 8.0*  HCT 30.4* 28.0* 22.8* 24.2*  MCV 83.1 83.3 83.8  --   PLT 348 318 273  --    Cardiac Enzymes: No results for input(s): CKTOTAL, CKMB, CKMBINDEX, TROPONINI in the last 168 hours. BNP (last 3 results) No results for input(s): BNP in the last 8760 hours.  ProBNP (last 3 results) No results  for input(s): PROBNP in the last 8760 hours.  CBG: No results for input(s): GLUCAP in the last 168 hours.  No results found for this or any previous visit (from the past 240 hour(s)).   Studies: No results found.  Scheduled Meds: . sodium chloride   Intravenous Once  . levothyroxine  25 mcg Intravenous Daily  . sodium chloride  3 mL Intravenous Q12H  . triazolam  0.25 mg Oral QHS   Continuous Infusions: . sodium chloride Stopped (06/26/15 0245)  . sodium chloride        Time spent: 25 minutes    Elizabeth Morton  [facility] Pager [phone]. If 7PM-7AM, please contact night-coverage at [email or web address], password [facility] 06/26/2015, 12:06 PM  LOS: 1 day

## 2015-06-27 ENCOUNTER — Encounter (HOSPITAL_COMMUNITY): Payer: Self-pay | Admitting: Internal Medicine

## 2015-06-27 DIAGNOSIS — I9589 Other hypotension: Secondary | ICD-10-CM

## 2015-06-27 LAB — PREPARE FRESH FROZEN PLASMA
Unit division: 0
Unit division: 0

## 2015-06-27 LAB — CBC
HEMATOCRIT: 28.4 % — AB (ref 36.0–46.0)
Hemoglobin: 9.8 g/dL — ABNORMAL LOW (ref 12.0–15.0)
MCH: 28.4 pg (ref 26.0–34.0)
MCHC: 34.5 g/dL (ref 30.0–36.0)
MCV: 82.3 fL (ref 78.0–100.0)
Platelets: 261 10*3/uL (ref 150–400)
RBC: 3.45 MIL/uL — ABNORMAL LOW (ref 3.87–5.11)
RDW: 19 % — AB (ref 11.5–15.5)
WBC: 8.5 10*3/uL (ref 4.0–10.5)

## 2015-06-27 LAB — TYPE AND SCREEN
ABO/RH(D): AB POS
Antibody Screen: NEGATIVE
UNIT DIVISION: 0
UNIT DIVISION: 0

## 2015-06-27 LAB — PROTIME-INR
INR: 1.32 (ref 0.00–1.49)
Prothrombin Time: 16.5 seconds — ABNORMAL HIGH (ref 11.6–15.2)

## 2015-06-27 MED ORDER — LEVOTHYROXINE SODIUM 50 MCG PO TABS
50.0000 ug | ORAL_TABLET | Freq: Every day | ORAL | Status: DC
  Administered 2015-06-27 – 2015-06-29 (×3): 50 ug via ORAL
  Filled 2015-06-27 (×3): qty 1

## 2015-06-27 MED ORDER — SODIUM CHLORIDE 0.9 % IV BOLUS (SEPSIS)
500.0000 mL | Freq: Once | INTRAVENOUS | Status: AC
  Administered 2015-06-27: 500 mL via INTRAVENOUS

## 2015-06-27 MED ORDER — WARFARIN SODIUM 4 MG PO TABS
4.0000 mg | ORAL_TABLET | Freq: Once | ORAL | Status: AC
  Administered 2015-06-27: 4 mg via ORAL
  Filled 2015-06-27: qty 1

## 2015-06-27 MED ORDER — ENOXAPARIN SODIUM 80 MG/0.8ML ~~LOC~~ SOLN
1.0000 mg/kg | Freq: Two times a day (BID) | SUBCUTANEOUS | Status: DC
  Administered 2015-06-27 – 2015-06-29 (×5): 70 mg via SUBCUTANEOUS
  Filled 2015-06-27 (×5): qty 0.8

## 2015-06-27 MED ORDER — WARFARIN - PHARMACIST DOSING INPATIENT
Freq: Every day | Status: DC
  Administered 2015-06-27: 18:00:00

## 2015-06-27 NOTE — Progress Notes (Signed)
[facility] PROGRESS NOTE  Elizabeth Morton [id number] DOB: 1956/04/25 DOA: 06/25/2015 PCP: [name], MD  59 year old female with history of CVA, hyperlipidemia, CAD status post CABG, aortic St. Jude valve replacement on chronic anticoagulation with Coumadin, peripheral vascular disease, GERD, iron deficiency anemia who recently underwent EGD and colonoscopy on 06/18/2015 for iron deficiency anemia showing normal EGD and had a single systolic polyp removed for prophylactically placed EndoClip. (Biopsy showing tubulovillous adenoma). postprocedure , Coumadin was resumed with Lovenox bridge. Since 12/12 patient had total 3 episodes of large amount of painless  bright red blood per rectum . She reported feeling dizzy and weak. Patient called her gastroenterologist and was advised to come to the ED. On arrival to the ED her hemoglobin was 10 with subsequent did drop to 7.3. She received 2 units FFP and 1 unit PRBC. INR of 1.75. [facility] consulted and patient admitted to hospitalist service on telemetry.   Assessment/Plan: Acute blood loss anemia secondary to lower GI bleed received 2 unit PRBC and 2 units FFP. Coumadin held. Colonoscopy done on 12/14 showed ulceration over polypectomy site with adherent clot likely the source.. No active bleed. 7 endoclips placed.   -reports minimal rectal bleeding but h&H stable.  Started coumadin with lovenox bridging. Monitor next 48 hrs for further GI bleed and or drop in H&h.   Warfarin induced coagulopathy Patient on chronic Coumadin for aortic valve replacement and was getting Lovenox bridging for Coumadin following a recent EGD and colonoscopy.  Chronic CHF/CAD  Blood pressure medications on hold due to low BP. IV fluids  Dyslipidemia Resume Zocor   Hypothyroidism   continue Synthroid  Hypotension.  Holding BP meds.  Gentle hydration  hypokalemia Replenished  Ventral hernia   scheduled for outp CT abd for tomorrow. Plan to  obtain as inpt  DVT prophylaxis: SCDs Diet: heart healthy  Code Status: Full code Family Communication: None at bedside Disposition Plan: Continue telemetry monitoring. Home possibly in 48 hrs   Consultants:  [facility]  Procedures:  None  Antibiotics:  None  HPI/Subjective: Pt reports small amount of bleeding PR. Feels weak.    Objective: Filed Vitals:   06/27/15 0500 06/27/15 1313  BP: 95/42 97/42  Pulse: 85 78  Temp: 99.6 F (37.6 C) 99 F (37.2 C)  Resp: 18 18    Intake/Output Summary (Last 24 hours) at 06/27/15 1405 Last data filed at 06/27/15 0800  Gross per 24 hour  Intake    600 ml  Output      0 ml  Net    600 ml   Filed Weights   06/25/15 2028  Weight: 71.804 kg (158 lb 4.8 oz)    Exam:   General: not in distress  HEENT: Pallor present, moist mucosa,   Chest: Clear to auscultation bilaterally  CVS: S1 and S2, systolic click+, No murmurs  GI: Soft, nondistended, nontender, bowel sounds present  Musculoskeletal: Warm, no edema    Data Reviewed: Basic Metabolic Panel:  Recent Labs Lab 06/25/15 1329 06/26/15 0745  NA 137 140  K 3.2* 3.2*  CL 103 110  CO2 23 23  GLUCOSE 127* 106*  BUN 24* 21*  CREATININE 1.25* 1.08*  CALCIUM 9.8 8.5*   Liver Function Tests:  Recent Labs Lab 06/25/15 1329  AST 26  ALT 26  ALKPHOS 66  BILITOT 0.5  PROT 6.7  ALBUMIN 3.9   No results for input(s): LIPASE, AMYLASE in the last 168 hours. No results for input(s): AMMONIA  in the last 168 hours. CBC:  Recent Labs Lab 06/25/15 1329 06/25/15 1655 06/26/15 0305 06/26/15 0745 06/26/15 1842 06/27/15 0245  WBC 8.8 8.0 7.1  --  10.5 8.5  NEUTROABS  --  5.3  --   --   --   --   HGB 10.0* 9.1* 7.3* 8.0* 9.5* 9.8*  HCT 30.4* 28.0* 22.8* 24.2* 29.2* 28.4*  MCV 83.1 83.3 83.8  --  82.5 82.3  PLT 348 318 273  --  244 261   Cardiac Enzymes: No results for input(s): CKTOTAL, CKMB, CKMBINDEX, TROPONINI in the last 168 hours. BNP (last 3  results) No results for input(s): BNP in the last 8760 hours.  ProBNP (last 3 results) No results for input(s): PROBNP in the last 8760 hours.  CBG: No results for input(s): GLUCAP in the last 168 hours.  No results found for this or any previous visit (from the past 240 hour(s)).   Studies: No results found.  Scheduled Meds: . sodium chloride   Intravenous Once  . enoxaparin (LOVENOX) injection  1 mg/kg Subcutaneous Q12H  . levothyroxine  50 mcg Oral QAC breakfast  . sodium chloride  500 mL Intravenous Once  . sodium chloride  3 mL Intravenous Q12H  . triazolam  0.25 mg Oral QHS  . warfarin  4 mg Oral ONCE-1800  . Warfarin - Pharmacist Dosing Inpatient   Does not apply q1800   Continuous Infusions: . sodium chloride 75 mL/hr at 06/26/15 1216      Time spent: 25 minutes    Nyaire Denbleyker, [place]  [facility] Pager [phone]. If 7PM-7AM, please contact night-coverage at [email or web address], password [facility] 06/27/2015, 2:05 PM  LOS: 2 days

## 2015-06-27 NOTE — Progress Notes (Signed)
ANTICOAGULATION CONSULT NOTE - Follow Up Consult  Pharmacy Consult for Lovenox and Coumadin Indication: aortic valve replacement  No Known Allergies  Patient Measurements: Height: 5\' 2"  (157.5 cm) Weight: 158 lb 4.8 oz (71.804 kg) IBW/kg (Calculated) : 50.1  Vital Signs: Temp: 99.6 F (37.6 C) (12/15 0500) Temp Source: Oral (12/15 0500) BP: 95/42 mmHg (12/15 0500) Pulse Rate: 85 (12/15 0500)  Labs:  Recent Labs  06/25/15 1329  06/26/15 0305 06/26/15 0745 06/26/15 1842 06/27/15 0245  HGB 10.0*  < > 7.3* 8.0* 9.5* 9.8*  HCT 30.4*  < > 22.8* 24.2* 29.2* 28.4*  PLT 348  < > 273  --  244 261  LABPROT  --   < > 20.4*  --  17.0* 16.5*  INR  --   < > 1.75*  --  1.38 1.32  CREATININE 1.25*  --   --  1.08*  --   --   < > = values in this interval not displayed.  Estimated Creatinine Clearance: 52.7 mL/min (by C-G formula based on Cr of 1.08).   Medications:  Scheduled:  . sodium chloride   Intravenous Once  . levothyroxine  25 mcg Intravenous Daily  . sodium chloride  3 mL Intravenous Q12H  . triazolam  0.25 mg Oral QHS    Assessment: 59 yo F on Coumadin PTA for aortic valve replacement and hx CVA.  Pt was bridged with Lovenox while Coumadin was on hold for EGD and colonoscopy.  Pt had repeat colonoscopy 12/14 to eval bleeding polyp site.  OK per GI to resetart anticoagulation.  Goal of Therapy:  INR 2-3 Anti-Xa level 0.6-1 units/ml 4hrs after LMWH dose given Monitor platelets by anticoagulation protocol: Yes   Plan:  Enox 70 mg SQ Q12h Coumadin 4 mg PO x 1 tonight Daily INR Change Synthroid to PO  [employer], Pharm.D., BCPS Clinical Pharmacist Pager [phone] 06/27/2015 10:26 AM

## 2015-06-27 NOTE — Op Note (Signed)
[facility] [place], [id number]   COLONOSCOPY PROCEDURE REPORT  PATIENT: Elizabeth, Morton  MR#: [id number] BIRTHDATE: 1955/08/20 , 8  yrs. old GENDER: female ENDOSCOPIST: Gatha Mayer, MD, [facility] PROCEDURE DATE:  06/26/2015 PROCEDURE:   Colonoscopy, diagnostic and Colonoscopy with control of bleeding First Screening Colonoscopy - Avg.  risk and is 50 yrs.  old or older - No.  Prior Negative Screening - Now for repeat screening. N/A  History of Adenoma - Now for follow-up colonoscopy & has been > or = to 3 yrs.  N/A  Polyps removed today? No Recommend repeat exam, <10 yrs? Yes high risk ASA CLASS:   Class III INDICATIONS:control bleeding. MEDICATIONS: 62.5 and Versed 5 mg IV  DESCRIPTION OF PROCEDURE:   After the risks benefits and alternatives of the procedure were thoroughly explained, informed consent was obtained.  The digital rectal exam revealed no abnormalities of the rectum.   The     endoscope was introduced through the anus and advanced to the cecum, which was identified by the ileocecal valve. No adverse events experienced.   The quality of the prep was fair.  The instrument was then slowly withdrawn as the colon was fully examined. Estimated blood loss is zero unless otherwise noted in this procedure report.      COLON FINDINGS: Large ulcer at ascending polypectomy site with adherent clots - not bleeding now but obvious stigmata treated with clips x 7 (2 BS 360 and 5 WC Instinct) and EPI x 8.5 cc total.   Colon not otherwise carefully inspected.  Retroflexion was not performed. The time to cecum = 3.6 Withdrawal time = 22.9   The scope was withdrawn and the procedure completed. COMPLICATIONS: There were no immediate complications.  ENDOSCOPIC IMPRESSION: Large ulcer at ascending polypectomy site with adherent clots - not bleeding now but obvious stigmata treated with clips x 7 (2 BS 360 and 5 WC Instinct) and EPI x 8.5  cc total  RECOMMENDATIONS: 1.  Clears 2.  Avoid anti-coagulation still - if ok overnight will restart Lovenox + warfarin  tomorrow  eSigned:  Gatha Mayer, MD, [facility] 06/26/2015 4:58 PM   cc: Dr. Scarlette Shorts

## 2015-06-27 NOTE — Progress Notes (Signed)
          Daily Rounding Note  06/27/2015, 8:31 AM  LOS: 2 days   SUBJECTIVE:       Watery brown stool this AM, no obvious blood, not melenic.  On clears.  Feels weak but no presyncope.  Wondering about inpt CT.   OBJECTIVE:         Vital signs in last 24 hours:    Temp:  [97.7 F (36.5 C)-99.6 F (37.6 C)] 99.6 F (37.6 C) (12/15 0500) Pulse Rate:  [78-108] 85 (12/15 0500) Resp:  [14-29] 18 (12/15 0500) BP: (90-148)/(28-69) 95/42 mmHg (12/15 0500) SpO2:  [92 %-100 %] 95 % (12/15 0500) Last BM Date: 06/26/15 Filed Weights   06/25/15 2028  Weight: 71.804 kg (158 lb 4.8 oz)   General: pleasant, looks well   Heart: RRR with valve sound. Chest: clear bil.  No dyspnea or cough.  Abdomen: soft, NT,ND.  Bruises at lovenox shot sites, ventral hernia.  Active BS  Extremities: no CCE Neuro/Psych:  Oriented x 3.  Alert.  Engaged, asking questions.  No obvious deficits.    Lab Results:  Recent Labs  06/26/15 0305 06/26/15 0745 06/26/15 1842 06/27/15 0245  WBC 7.1  --  10.5 8.5  HGB 7.3* 8.0* 9.5* 9.8*  HCT 22.8* 24.2* 29.2* 28.4*  PLT 273  --  244 261     Recent Labs  06/26/15 1842 06/27/15 0245  LABPROT 17.0* 16.5*  INR 1.38 1.32      ASSESMENT:   * Post polypectomy bleed following 06/18/15 colonoscopy with piece meal removal of large sessile TVA, tatooing and endoclip placement.  06/26/15 Colonoscopy: large polypectomy site ulcer with adherent clots, no active bleeding but obvious stigmata.  7 endoclips deployed.  Low BPs to 90s/40s this AM.    * Iron def anemia and ABL anemia.  S/p PRBCs x 2.  Hgb improved.   * Chronic Coumadin for mechanical AVR, hx CVA prior to AVR. On Lovenox bridge and Coumadin post colonoscopy. Lovenox/Coumadin on hold.   * Ventral hernia. CT set for 12/16 at [facility].     * Hypokalemia.    *  AKI, improved.    PLAN   *  Start Lovenox, Coumadin per Pharmacy.    *  Cancel  tomorrow's outpt CT.  Prefer not to send her off floor right now for inpt CT  *  HH diet.     Elizabeth Morton  06/27/2015, 8:31 AM Pager: [phone]    [facility] Attending   I have taken an interval history, reviewed the chart and examined the patient. I agree with the Advanced Practitioner's note, impression and recommendations.    She is still a bit weak I think could be low intravascular vol + the anemia - will give 500 cc NS bolus  CT ordered for tomorrow  Once ok x 48 hrs w/o bleeding and we have a plan for f/u anti-coag clinic would send her home on Lovenox and warfarin  Thanks  Elizabeth Mayer, MD, [facility] [phone] (pager) [phone] after 5 PM, weekends and holidays  06/27/2015 1:53 PM

## 2015-06-28 ENCOUNTER — Ambulatory Visit (HOSPITAL_COMMUNITY): Payer: Commercial Managed Care - HMO

## 2015-06-28 ENCOUNTER — Other Ambulatory Visit: Payer: Self-pay | Admitting: Physician Assistant

## 2015-06-28 ENCOUNTER — Inpatient Hospital Stay (HOSPITAL_COMMUNITY): Payer: Commercial Managed Care - HMO

## 2015-06-28 ENCOUNTER — Encounter (HOSPITAL_COMMUNITY): Payer: Self-pay | Admitting: *Deleted

## 2015-06-28 DIAGNOSIS — D62 Acute posthemorrhagic anemia: Secondary | ICD-10-CM

## 2015-06-28 DIAGNOSIS — K922 Gastrointestinal hemorrhage, unspecified: Secondary | ICD-10-CM | POA: Diagnosis present

## 2015-06-28 DIAGNOSIS — I959 Hypotension, unspecified: Secondary | ICD-10-CM

## 2015-06-28 LAB — CBC
HCT: 27.7 % — ABNORMAL LOW (ref 36.0–46.0)
HEMOGLOBIN: 9.2 g/dL — AB (ref 12.0–15.0)
MCH: 27.8 pg (ref 26.0–34.0)
MCHC: 33.2 g/dL (ref 30.0–36.0)
MCV: 83.7 fL (ref 78.0–100.0)
PLATELETS: 253 10*3/uL (ref 150–400)
RBC: 3.31 MIL/uL — AB (ref 3.87–5.11)
RDW: 19.1 % — ABNORMAL HIGH (ref 11.5–15.5)
WBC: 7.5 10*3/uL (ref 4.0–10.5)

## 2015-06-28 LAB — PROTIME-INR
INR: 1.45 (ref 0.00–1.49)
PROTHROMBIN TIME: 17.7 s — AB (ref 11.6–15.2)

## 2015-06-28 MED ORDER — WARFARIN SODIUM 4 MG PO TABS
4.0000 mg | ORAL_TABLET | Freq: Once | ORAL | Status: AC
  Administered 2015-06-28: 4 mg via ORAL
  Filled 2015-06-28: qty 1

## 2015-06-28 MED ORDER — BARIUM SULFATE 2.1 % PO SUSP
450.0000 mL | Freq: Two times a day (BID) | ORAL | Status: DC
  Administered 2015-06-28 (×2): 450 mL via ORAL

## 2015-06-28 MED ORDER — IOHEXOL 300 MG/ML  SOLN
100.0000 mL | Freq: Once | INTRAMUSCULAR | Status: AC | PRN
  Administered 2015-06-28: 100 mL via INTRAVENOUS

## 2015-06-28 MED ORDER — BARIUM SULFATE 2.1 % PO SUSP
ORAL | Status: AC
  Filled 2015-06-28: qty 2

## 2015-06-28 NOTE — Care Management Note (Signed)
Case Management Note  Patient Details  Name: Elizabeth Morton MRN: [id number] Date of Birth: 1956/06/01  Subjective/Objective:      Pt admitted with acute GI bleed               Action/Plan:  Pt is independent from home.  Pt was on lovenox at home prior to admit awaiting procedure.  Pt is now on Lovenox to Coumadin bridge until INR is theraputic.     Expected Discharge Date:                  Expected Discharge Plan:  Home/Self Care  In-House Referral:     Discharge planning Services  CM Consult  Post Acute Care Choice:    Choice offered to:     DME Arranged:    DME Agency:     HH Arranged:    HH Agency:     Status of Service:  In process, will continue to follow  Medicare Important Message Given:    Date Medicare IM Given:    Medicare IM give by:    Date Additional Medicare IM Given:    Additional Medicare Important Message give by:     If discussed at [place] of Stay Meetings, dates discussed:    Additional Comments: CM assessed pt,pt stated she doesn't have any more lovenox at home.  Pt stated her copay for the lovenox was $25, pt stated she could pay the copay. CM will continue to follow for disposition needs [name], RN 06/28/2015, 11:13 AM

## 2015-06-28 NOTE — Progress Notes (Signed)
ANTICOAGULATION CONSULT NOTE - Follow Up Consult  Pharmacy Consult for Lovenox and Coumadin Indication: mechanical aortic valve replacement  No Known Allergies  Patient Measurements: Height: 5\' 2"  (157.5 cm) Weight: 158 lb 4.8 oz (71.804 kg) IBW/kg (Calculated) : 50.1  Vital Signs: Temp: 98.2 F (36.8 C) (12/16 0443) Temp Source: Oral (12/16 0443) BP: 112/41 mmHg (12/16 0443) Pulse Rate: 85 (12/16 0443)  Labs:  Recent Labs  06/25/15 1329  06/26/15 0745 06/26/15 1842 06/27/15 0245 06/28/15 0341  HGB 10.0*  < > 8.0* 9.5* 9.8* 9.2*  HCT 30.4*  < > 24.2* 29.2* 28.4* 27.7*  PLT 348  < >  --  244 261 253  LABPROT  --   < >  --  17.0* 16.5* 17.7*  INR  --   < >  --  1.38 1.32 1.45  CREATININE 1.25*  --  1.08*  --   --   --   < > = values in this interval not displayed.  Estimated Creatinine Clearance: 52.7 mL/min (by C-G formula based on Cr of 1.08).   Medications:  Scheduled:  . sodium chloride   Intravenous Once  . enoxaparin (LOVENOX) injection  1 mg/kg Subcutaneous Q12H  . levothyroxine  50 mcg Oral QAC breakfast  . sodium chloride  3 mL Intravenous Q12H  . triazolam  0.25 mg Oral QHS  . Warfarin - Pharmacist Dosing Inpatient   Does not apply q1800    Assessment: 59 yo F on Coumadin PTA for aortic valve replacement and hx CVA.  Pt was bridged with Lovenox while Coumadin was on hold for EGD and colonoscopy.  Pt had repeat colonoscopy 12/14 to eval bleeding polyp site.  OK per GI to resetart anticoagulation.  INR is subtherapeutic at 1.45. No bleeding noted, Hb is stable in 9s, platelets are normal.  PTA regimen: 3 mg daily except 1.5 mg on MWF  Goal of Therapy:  INR 2-3 Anti-Xa level 0.6-1 units/ml 4hrs after LMWH dose given Monitor platelets by anticoagulation protocol: Yes   Plan:  Enoxaprain 70 mg SQ Q12h Coumadin 4 mg PO x 1 tonight Daily INR Monitor for s/sx of bleeding  [facility], Pharm.D., BCPS Clinical Pharmacist Pager:  [phone] 06/28/2015 10:54 AM

## 2015-06-28 NOTE — Progress Notes (Signed)
[facility] PROGRESS NOTE  Elizabeth Morton [id number] DOB: [birth year] DOA: 06/25/2015 PCP: Elizabeth Peaches, MD  Brief narrative 59 year old female with history of CVA, hyperlipidemia, CAD status post CABG, aortic [employer] valve replacement on chronic anticoagulation with Coumadin, peripheral vascular disease, GERD, iron deficiency anemia who recently underwent EGD and colonoscopy on 06/18/2015 for iron deficiency anemia showing normal EGD and had a single systolic polyp removed for prophylactically placed EndoClip. (Biopsy showing tubulovillous adenoma). postprocedure , Coumadin was resumed with Lovenox bridge. Since 12/12 patient had total 3 episodes of large amount of painless  bright red blood per rectum . She reported feeling dizzy and weak. Patient called her gastroenterologist and was advised to come to the ED. On arrival to the ED her hemoglobin was 10 with subsequent did drop to 7.3. She received 2 units FFP and 1 unit PRBC. INR of 1.75. [facility] consulted and patient admitted to hospitalist service on telemetry.   Assessment/Plan: Acute blood loss anemia secondary to lower GI bleed received 2 unit PRBC and 2 units FFP. Coumadin held. Colonoscopy done on 12/14 showed ulceration over polypectomy site with adherent clot likely the source.. No active bleed. 7 endoclips placed.   -No further rectal bleed. H&H stable. Started coumadin with lovenox bridging. If no further bleeding until tomorrow morning we'll discharge her home.   Warfarin induced coagulopathy Patient on chronic Coumadin for aortic valve replacement and was getting Lovenox bridging for Coumadin following a recent EGD and colonoscopy. Will discharge her on Lovenox bridging until INR therapeutic.  Chronic CHF/CAD  Blood pressure medications on hold due to low BP. improved with IV fluids.  Dyslipidemia Continue Zocor   Hypothyroidism   continue Synthroid  Hypotension.  Holding BP meds.     hypokalemia Replenished  Ventral hernia  CT abdomen  today.  DVT prophylaxis: SCDs Diet: heart healthy  Code Status: Full code Family Communication: None at bedside Disposition Plan: Home in a.m. if no further bleeding.   Consultants:  [facility]  Procedures:  None  Antibiotics:  None  HPI/Subjective: Has not had any blood per rectum past 24 hours.   Objective: Filed Vitals:   06/27/15 2042 06/28/15 0443  BP: 106/53 112/41  Pulse: 91 85  Temp: 98.5 F (36.9 C) 98.2 F (36.8 C)  Resp: 18 16    Intake/Output Summary (Last 24 hours) at 06/28/15 1055 Last data filed at 06/28/15 0800  Gross per 24 hour  Intake   1140 ml  Output      0 ml  Net   1140 ml   Filed Weights   06/25/15 2028  Weight: 71.804 kg (158 lb 4.8 oz)    Exam:   General: not in distress  HEENT:  moist mucosa,   Chest: Clear to auscultation bilaterally  CVS: S1 and S2, systolic click+, 2/6 systolic murmur  GI: Soft, nondistended, nontender, bowel sounds present  Musculoskeletal: Warm, no edema    Data Reviewed: Basic Metabolic Panel:  Recent Labs Lab 06/25/15 1329 06/26/15 0745  NA 137 140  K 3.2* 3.2*  CL 103 110  CO2 23 23  GLUCOSE 127* 106*  BUN 24* 21*  CREATININE 1.25* 1.08*  CALCIUM 9.8 8.5*   Liver Function Tests:  Recent Labs Lab 06/25/15 1329  AST 26  ALT 26  ALKPHOS 66  BILITOT 0.5  PROT 6.7  ALBUMIN 3.9   No results for input(s): LIPASE, AMYLASE in the last 168 hours. No results for input(s): AMMONIA in the last 168  hours. CBC:  Recent Labs Lab 06/25/15 1655 06/26/15 0305 06/26/15 0745 06/26/15 1842 06/27/15 0245 06/28/15 0341  WBC 8.0 7.1  --  10.5 8.5 7.5  NEUTROABS 5.3  --   --   --   --   --   HGB 9.1* 7.3* 8.0* 9.5* 9.8* 9.2*  HCT 28.0* 22.8* 24.2* 29.2* 28.4* 27.7*  MCV 83.3 83.8  --  82.5 82.3 83.7  PLT 318 273  --  244 261 253   Cardiac Enzymes: No results for input(s): CKTOTAL, CKMB, CKMBINDEX, TROPONINI in the last  168 hours. BNP (last 3 results) No results for input(s): BNP in the last 8760 hours.  ProBNP (last 3 results) No results for input(s): PROBNP in the last 8760 hours.  CBG: No results for input(s): GLUCAP in the last 168 hours.  No results found for this or any previous visit (from the past 240 hour(s)).   Studies: No results found.  Scheduled Meds: . sodium chloride   Intravenous Once  . enoxaparin (LOVENOX) injection  1 mg/kg Subcutaneous Q12H  . levothyroxine  50 mcg Oral QAC breakfast  . sodium chloride  3 mL Intravenous Q12H  . triazolam  0.25 mg Oral QHS  . Warfarin - Pharmacist Dosing Inpatient   Does not apply q1800   Continuous Infusions:      Time spent: 25 minutes    Elizabeth Morton, [place]  [facility] Pager [phone]. If 7PM-7AM, please contact night-coverage at [email or web address], password [facility] 06/28/2015, 10:55 AM  LOS: 3 days

## 2015-06-28 NOTE — Progress Notes (Signed)
Daily Rounding Note  06/28/2015, 8:28 AM  LOS: 3 days   SUBJECTIVE:       Started back on Warfarin and Lovenox 12/15.  Stool watery, brown, 1 yesterday, 1 this AM.  Feels ok.  On solid diet.    OBJECTIVE:         Vital signs in last 24 hours:    Temp:  [98.2 F (36.8 C)-99 F (37.2 C)] 98.2 F (36.8 C) (12/16 0443) Pulse Rate:  [78-91] 85 (12/16 0443) Resp:  [16-18] 16 (12/16 0443) BP: (97-112)/(41-53) 112/41 mmHg (12/16 0443) SpO2:  [93 %-100 %] 100 % (12/16 0443) Last BM Date: 06/27/15 Filed Weights   06/25/15 2028  Weight: 71.804 kg (158 lb 4.8 oz)   General: pleasant, not ill appearing   Heart: RRR, valve snap.  Chest: clear bil.  No dyspnea or cough.  Abdomen: soft, NT, ND.  BS active  Extremities: no CCE.   Neuro/Psych:  Pleasant, calm, in good spirits.   Intake/Output from previous day: 12/15 0701 - 12/16 0700 In: 1020 [P.O.:720; I.V.:300] Out: -   Intake/Output this shift:    Lab Results:  Recent Labs  06/26/15 1842 06/27/15 0245 06/28/15 0341  WBC 10.5 8.5 7.5  HGB 9.5* 9.8* 9.2*  HCT 29.2* 28.4* 27.7*  PLT 244 261 253   BMET  Recent Labs  06/25/15 1329 06/26/15 0745  NA 137 140  K 3.2* 3.2*  CL 103 110  CO2 23 23  GLUCOSE 127* 106*  BUN 24* 21*  CREATININE 1.25* 1.08*  CALCIUM 9.8 8.5*   LFT  Recent Labs  06/25/15 1329  PROT 6.7  ALBUMIN 3.9  AST 26  ALT 26  ALKPHOS 66  BILITOT 0.5   PT/INR  Recent Labs  06/27/15 0245 06/28/15 0341  LABPROT 16.5* 17.7*  INR 1.32 1.45   Hepatitis Panel No results for input(s): HEPBSAG, HCVAB, HEPAIGM, HEPBIGM in the last 72 hours.  Studies/Results: No results found.   Scheduled Meds: . sodium chloride   Intravenous Once  . enoxaparin (LOVENOX) injection  1 mg/kg Subcutaneous Q12H  . levothyroxine  50 mcg Oral QAC breakfast  . sodium chloride  3 mL Intravenous Q12H  . triazolam  0.25 mg Oral QHS  . Warfarin -  Pharmacist Dosing Inpatient   Does not apply q1800   Continuous Infusions: . sodium chloride 75 mL/hr at 06/26/15 1216   PRN Meds:.acetaminophen **OR** acetaminophen, ondansetron **OR** ondansetron (ZOFRAN) IV   ASSESMENT:   * Post polypectomy bleed following 06/18/15 colonoscopy with piece meal removal of large sessile TVA, tatooing and endoclip placement.  06/26/15 Colonoscopy: large polypectomy site ulcer with adherent clots, no active bleeding but obvious stigmata. 7 endoclips deployed.  BP remains soft.   * Iron def anemia and ABL anemia. S/p PRBCs x 2. Hgb improved, stable.    * Chronic Coumadin for mechanical AVR, hx CVA prior to AVR. S/P FFP x 2.  Lovenox, Coumadin restarted 12/15.   * Ventral hernia. CT set for today.   * Hypokalemia. No follow up BMET.   * AKI, improved.  Baseline unknown.    PLAN   *  AM CBC.  If no further bleeding and stable Hgb, home in AM.      Elizabeth Morton  06/28/2015, 8:28 AM Pager: [phone]  Awaiting CT No bleeding. Plans as above. Will arrange for her to have CBC in [place] lab on Tues 12/20  Elizabeth Mayer, MD,  [facility] [phone] (pager) [phone] after 5 PM, weekends and holidays  06/28/2015 12:59 PM

## 2015-06-29 DIAGNOSIS — E876 Hypokalemia: Secondary | ICD-10-CM

## 2015-06-29 LAB — PROTIME-INR
INR: 1.43 (ref 0.00–1.49)
Prothrombin Time: 17.6 seconds — ABNORMAL HIGH (ref 11.6–15.2)

## 2015-06-29 LAB — CBC
HCT: 29.1 % — ABNORMAL LOW (ref 36.0–46.0)
Hemoglobin: 9.6 g/dL — ABNORMAL LOW (ref 12.0–15.0)
MCH: 27.8 pg (ref 26.0–34.0)
MCHC: 33 g/dL (ref 30.0–36.0)
MCV: 84.3 fL (ref 78.0–100.0)
PLATELETS: 270 10*3/uL (ref 150–400)
RBC: 3.45 MIL/uL — AB (ref 3.87–5.11)
RDW: 19.1 % — AB (ref 11.5–15.5)
WBC: 5.9 10*3/uL (ref 4.0–10.5)

## 2015-06-29 MED ORDER — WARFARIN SODIUM 3 MG PO TABS: 3.0000 mg | ORAL_TABLET | Freq: Once | ORAL | Status: DC

## 2015-06-29 MED ORDER — SODIUM CHLORIDE 0.9 % IV BOLUS (SEPSIS)
1000.0000 mL | Freq: Once | INTRAVENOUS | Status: AC
  Administered 2015-06-29: 1000 mL via INTRAVENOUS

## 2015-06-29 MED ORDER — ENOXAPARIN SODIUM 80 MG/0.8ML ~~LOC~~ SOLN: 80.0000 mg | Freq: Two times a day (BID) | SUBCUTANEOUS | Status: DC

## 2015-06-29 NOTE — Discharge Summary (Addendum)
Physician Discharge Summary  Elizabeth Morton [id number] DOB: 1956/05/27 DOA: 06/25/2015  PCP: [name], MD  Admit date: 06/25/2015 Discharge date: 06/29/2015  Time spent: 35  minutes  Recommendations for Outpatient Follow-up:  Discharge home on lovenox bridge with coumadin . Needs to follow up at coumadin clinic early next week for INR check.  Discharge Diagnoses:  Principal Problem:   Lower GI bleed   Active Problems:   Warfarin anticoagulation   Hypertension   Dyslipidemia   S/P aortic valve replacement   Acute GI bleeding   Chronic congestive heart failure with left ventricular diastolic dysfunction (HCC)   Hypothyroidism   Acute blood loss anemia   Hypokalemia   Ventral hernia   Discharge Condition: fair  Diet recommendation: heart healthy  Filed Weights   06/25/15 2028  Weight: 71.804 kg (158 lb 4.8 oz)    History of present illness:  Please refer to admission H&P from 12/13 for details, in brief, 59 year old female with history of CVA, hyperlipidemia, CAD status post CABG, aortic St. Jude valve replacement on chronic anticoagulation with Coumadin, peripheral vascular disease, GERD, iron deficiency anemia who recently underwent EGD and colonoscopy on 06/18/2015 for iron deficiency anemia showing normal EGD and had a single systolic polyp removed for prophylactically placed EndoClip. (Biopsy showing tubulovillous adenoma). postprocedure , Coumadin was resumed with Lovenox bridge. Since 12/12 patient had total 3 episodes of large amount of painless bright red blood per rectum . She reported feeling dizzy and weak. Patient called her gastroenterologist and was advised to come to the ED. On arrival to the ED her hemoglobin was 10 which dropped  to 7.3. She received 2 units FFP and 1 unit PRBC. INR of 1.75. [facility] consulted and patient admitted to hospitalist service on telemetry.  Hospital Course:  Acute blood loss anemia secondary to lower GI  bleed received 2 unit PRBC and 2 units FFP. Coumadin held. Colonoscopy done on 12/14 showed ulceration over polypectomy site with adherent clot likely the source.. No active bleed. 7 endoclips placed.  -No further rectal bleed. H&H stable. Started coumadin with lovenox bridging. Stable for d/c home. Needs to follow at the coumadin clinic early next week for INR monitoring. instructed to come to hospital if she has further GI bleed or symptoms of dizziness, syncope, chest pain or shortness of breath.    Warfarin induced coagulopathy Patient on chronic Coumadin for aortic valve replacement and was getting Lovenox bridging for Coumadin following a recent EGD and colonoscopy. INR of 1.43 on discharge. Will discharge her on Lovenox bridging until INR therapeutic.  Chronic CHF/CAD Blood pressure medications on hold due to low BP. improved with IV fluids.  Dyslipidemia Continue Zocor   Hypothyroidism  continue Synthroid  Hypotension.  Holding lasix. Ordered 1 L NS bolus prior to discharge.   hypokalemia Replenished  Ventral hernia  CT abdomen on 12/16 showed small supraumbilical ventral hernia containing omental fat without any obstruction or incarceration.    Code Status: Full code Family Communication: None at bedside Disposition Plan: Home   Consultants:  [facility]  Procedures:  None  Antibiotics:  None  Discharge Exam: Filed Vitals:   06/29/15 0742 06/29/15 1100  BP: 111/46 108/50  Pulse: 85 72  Temp:    Resp:      General: middle aged female not in distress  HEENT: pallor +, moist mucosa,   Chest: Clear to auscultation bilaterally  CVS: S1 and S2, systolic click+, 2/6 systolic murmur  GI: Soft, nondistended, nontender,  bowel sounds present  Musculoskeletal: Warm, no edema  Discharge Instructions    Current Discharge Medication List    CONTINUE these medications which have CHANGED   Details  enoxaparin (LOVENOX) 80 MG/0.8ML injection  Inject 0.8 mLs (80 mg total) into the skin every 12 (twelve) hours. As Instructed by Coumadin Clinic Qty: 20 Syringe, Refills: 0    warfarin (COUMADIN) 3 MG tablet Take 1 tablet (3 mg total) by mouth one time only at 6 PM. Qty: 40 tablet, Refills: 3  3 mg daily except for 1.5 mg on Wednesdays and Fridays     CONTINUE these medications which have NOT CHANGED   Details  Calcium Carbonate-Vitamin D (CALCIUM 600+D) 600-400 MG-UNIT per tablet Take 1 tablet by mouth 2 (two) times daily.    ferrous sulfate 325 (65 FE) MG tablet Take 1 tablet (325 mg total) by mouth daily with breakfast. Qty: 100 tablet, Refills: 3    levothyroxine (SYNTHROID, LEVOTHROID) 50 MCG tablet Take 50 mcg by mouth daily before breakfast.    omeprazole (PRILOSEC) 20 MG capsule Take 20 mg by mouth daily.      potassium chloride (MICRO-K) 10 MEQ CR capsule TAKE 1 TABLET BY MOUTH EVERY DAY Qty: 15 capsule, Refills: 0    Simethicone (PHAZYME PO) Take 250 mg by mouth daily as needed (gas).     simvastatin (ZOCOR) 40 MG tablet Take 1 tablet (40 mg total) by mouth at bedtime. Qty: 15 tablet, Refills: 0    triazolam (HALCION) 0.25 MG tablet Take 0.25 mg by mouth at bedtime.      STOP taking these medications     furosemide (LASIX) 20 MG tablet      polyethylene glycol powder (GLYCOLAX/MIRALAX) powder        No Known Allergies Follow-up Information    Follow up with GREEN, EDWIN JAY, MD. Schedule an appointment as soon as possible for a visit in 1 week.   Specialty:  Internal Medicine   Contact information:   [address] [place] [address] [phone]        The results of significant diagnostics from this hospitalization (including imaging, microbiology, ancillary and laboratory) are listed below for reference.    Significant Diagnostic Studies: Ct Abdomen Pelvis W Contrast  06/28/2015  CLINICAL DATA:  59 year old female with history of rectal bleeding after colonoscopy performed on  06/18/2015. Swollen abdomen. No recent bowel movement. EXAM: CT ABDOMEN AND PELVIS WITH CONTRAST TECHNIQUE: Multidetector CT imaging of the abdomen and pelvis was performed using the standard protocol following bolus administration of intravenous contrast. CONTRAST:  148mL OMNIPAQUE IOHEXOL 300 MG/ML  SOLN COMPARISON:  CT of the abdomen and pelvis 11/10/2004. FINDINGS: Lower chest: Mild cardiomegaly with left atrial dilatation. Small hiatal hernia. Mild scarring in the lung bases bilaterally, most. In the inferior segment of the lingula. Median sternotomy wires. Hepatobiliary: No cystic or solid hepatic lesions. No intra or extrahepatic biliary ductal dilatation. Status post cholecystectomy. Pancreas: No pancreatic mass. No pancreatic ductal dilatation. No pancreatic or peripancreatic fluid or inflammatory changes. Spleen: Unremarkable. Adrenals/Urinary Tract: Bilateral adrenal glands and bilateral kidneys are normal in appearance. No hydroureteronephrosis or perinephric stranding to indicate urinary tract obstruction at this time. Urinary bladder is normal in appearance. Stomach/Bowel: Normal appearance of the stomach. No pathologic dilatation of small bowel or colon. Normal appendix. Vascular/Lymphatic: Atherosclerosis throughout the abdominal and pelvic vasculature, without evidence of aneurysm or dissection. No lymphadenopathy noted in the abdomen or pelvis. Reproductive: Uterus and ovaries are atrophic.  Other: Trace volume of free fluid in the inferior aspect of the right pericolic gutter. No larger volume of ascites. No pneumoperitoneum. Small ventral hernia slightly to the left of midline in the supraumbilical region containing only omental fat. Musculoskeletal: There are no aggressive appearing lytic or blastic lesions noted in the visualized portions of the skeleton. IMPRESSION: 1. No acute findings in the abdomen or pelvis to account for the patient's symptoms. 2. Specifically, no pneumoperitoneum to  suggest perforation following colonoscopy. 3. Small ventral hernia in the supraumbilical region slightly to the left of midline containing omental fat. No associated bowel incarceration or obstruction at this time. 4. Trace volume of ascites in the right pericolic gutter. 5. Normal appendix. 6. Small hiatal hernia. 7. Status post cholecystectomy. 8. Atherosclerosis. 9. Mild cardiomegaly with left atrial dilatation. 10. Additional incidental findings, as above. Electronically Signed   By: Vinnie Langton M.D.   On: 06/28/2015 15:33   Dg Bone Density  06/14/2015  EXAM: DUAL X-RAY ABSORPTIOMETRY (DXA) FOR BONE MINERAL DENSITY IMPRESSION: Referring Physician:  Lance Muss PATIENT: Name: CHITARA, PITMON Patient ID: [id number] Birth Date: 04-01-1956 Height: 62.0 in. Sex: Female Measured: 06/14/2015 Weight: 161.0 lbs. Indications: Estrogen Deficient, Postmenopausal, Prilosec, Synthroid Fractures: Treatments: Calcium (E943.0), Vitamin D (E933.5) ASSESSMENT: The BMD measured at AP Spine L1-L2 is 0.868 g/cm2 with a T-score of -2.5. This patient is considered osteoporotic according to [facility]) criteria. L-3, L-4 was excluded due to degenerative changes. Site Region Measured Date Measured Age YA BMD Significant CHANGE T-score AP Spine  L1-L2     7/14/2015    [age]         -2.5    0.868 g/cm2 DualFemur Neck Left 06/14/2015    59.0         -1.8    0.783 g/cm2 World Health Organization [facility]) criteria for post-menopausal, Caucasian Women: Normal       T-score at or above -1 SD Osteopenia   T-score between -1 and -2.5 SD Osteoporosis T-score at or below -2.5 SD RECOMMENDATION: [facility] recommends that FDA-approved medical therapies be considered in postmenopausal women and men age 70 or older with a: 1. Hip or vertebral (clinical or morphometric) fracture. 2. T-score of <-2.5 at the spine or hip. 3. Ten-year fracture probability by FRAX of 3% or greater for hip fracture or 20% or  greater for major osteoporotic fracture. All treatment decisions require clinical judgment and consideration of individual patient factors, including patient preferences, co-morbidities, previous drug use, risk factors not captured in the FRAX model (e.g. falls, vitamin D deficiency, increased bone turnover, interval significant decline in bone density) and possible under - or over-estimation of fracture risk by FRAX. All patients should ensure an adequate intake of dietary calcium (1200 mg/d) and vitamin D (800 IU daily) unless contraindicated. FOLLOW-UP: People with diagnosed cases of osteoporosis or at high risk for fracture should have regular bone mineral density tests. For patients eligible for Medicare, routine testing is allowed once every 2 years. The testing frequency can be increased to one year for patients who have rapidly progressing disease, those who are receiving or discontinuing medical therapy to restore bone mass, or have additional risk factors. I have reviewed this report, and agree with the above findings. [facility] Electronically Signed   By: Earle Gell M.D.   On: 06/14/2015 12:47   Mm Digital Screening Bilateral  06/05/2015  CLINICAL DATA:  Screening. EXAM: DIGITAL SCREENING BILATERAL MAMMOGRAM WITH CAD COMPARISON:  Previous  exam(s). ACR Breast Density Category b: There are scattered areas of fibroglandular density. FINDINGS: There are no findings suspicious for malignancy. Images were processed with CAD. IMPRESSION: No mammographic evidence of malignancy. A result letter of this screening mammogram will be mailed directly to the patient. RECOMMENDATION: Screening mammogram in one year. (Code:SM-B-01Y) BI-RADS CATEGORY  1: Negative. Electronically Signed   By: Lillia Mountain M.D.   On: 06/05/2015 09:43    Microbiology: No results found for this or any previous visit (from the past 240 hour(s)).   Labs: Basic Metabolic Panel:  Recent Labs Lab 06/25/15 1329  06/26/15 0745  NA 137 140  K 3.2* 3.2*  CL 103 110  CO2 23 23  GLUCOSE 127* 106*  BUN 24* 21*  CREATININE 1.25* 1.08*  CALCIUM 9.8 8.5*   Liver Function Tests:  Recent Labs Lab 06/25/15 1329  AST 26  ALT 26  ALKPHOS 66  BILITOT 0.5  PROT 6.7  ALBUMIN 3.9   No results for input(s): LIPASE, AMYLASE in the last 168 hours. No results for input(s): AMMONIA in the last 168 hours. CBC:  Recent Labs Lab 06/25/15 1655 06/26/15 0305 06/26/15 0745 06/26/15 1842 06/27/15 0245 06/28/15 0341 06/29/15 0412  WBC 8.0 7.1  --  10.5 8.5 7.5 5.9  NEUTROABS 5.3  --   --   --   --   --   --   HGB 9.1* 7.3* 8.0* 9.5* 9.8* 9.2* 9.6*  HCT 28.0* 22.8* 24.2* 29.2* 28.4* 27.7* 29.1*  MCV 83.3 83.8  --  82.5 82.3 83.7 84.3  PLT 318 273  --  244 261 253 270   Cardiac Enzymes: No results for input(s): CKTOTAL, CKMB, CKMBINDEX, TROPONINI in the last 168 hours. BNP: BNP (last 3 results) No results for input(s): BNP in the last 8760 hours.  ProBNP (last 3 results) No results for input(s): PROBNP in the last 8760 hours.  CBG: No results for input(s): GLUCAP in the last 168 hours.     SignedLouellen Molder  [facility] 06/29/2015, 11:24 AM

## 2015-06-29 NOTE — Discharge Instructions (Signed)

## 2015-06-29 NOTE — Progress Notes (Signed)
Pt denies any complaints, took a shower without any problems. VSS. Discharged to home with son. Pt verbalized understanding of all discharge instructions.

## 2015-07-02 ENCOUNTER — Ambulatory Visit (INDEPENDENT_AMBULATORY_CARE_PROVIDER_SITE_OTHER): Payer: Commercial Managed Care - HMO | Admitting: *Deleted

## 2015-07-02 DIAGNOSIS — Z952 Presence of prosthetic heart valve: Secondary | ICD-10-CM

## 2015-07-02 DIAGNOSIS — Z954 Presence of other heart-valve replacement: Secondary | ICD-10-CM

## 2015-07-02 DIAGNOSIS — Z7901 Long term (current) use of anticoagulants: Secondary | ICD-10-CM | POA: Diagnosis not present

## 2015-07-02 DIAGNOSIS — I6350 Cerebral infarction due to unspecified occlusion or stenosis of unspecified cerebral artery: Secondary | ICD-10-CM

## 2015-07-02 DIAGNOSIS — I359 Nonrheumatic aortic valve disorder, unspecified: Secondary | ICD-10-CM

## 2015-07-02 DIAGNOSIS — I639 Cerebral infarction, unspecified: Secondary | ICD-10-CM | POA: Diagnosis not present

## 2015-07-02 LAB — POCT INR: INR: 1.5

## 2015-07-05 ENCOUNTER — Ambulatory Visit (INDEPENDENT_AMBULATORY_CARE_PROVIDER_SITE_OTHER): Payer: Commercial Managed Care - HMO

## 2015-07-05 DIAGNOSIS — I359 Nonrheumatic aortic valve disorder, unspecified: Secondary | ICD-10-CM

## 2015-07-05 DIAGNOSIS — I639 Cerebral infarction, unspecified: Secondary | ICD-10-CM

## 2015-07-05 DIAGNOSIS — Z7901 Long term (current) use of anticoagulants: Secondary | ICD-10-CM | POA: Diagnosis not present

## 2015-07-05 DIAGNOSIS — Z954 Presence of other heart-valve replacement: Secondary | ICD-10-CM | POA: Diagnosis not present

## 2015-07-05 DIAGNOSIS — Z952 Presence of prosthetic heart valve: Secondary | ICD-10-CM

## 2015-07-05 DIAGNOSIS — I6350 Cerebral infarction due to unspecified occlusion or stenosis of unspecified cerebral artery: Secondary | ICD-10-CM

## 2015-07-05 LAB — POCT INR: INR: 1.5

## 2015-07-10 ENCOUNTER — Ambulatory Visit (INDEPENDENT_AMBULATORY_CARE_PROVIDER_SITE_OTHER): Payer: Commercial Managed Care - HMO | Admitting: *Deleted

## 2015-07-10 DIAGNOSIS — I359 Nonrheumatic aortic valve disorder, unspecified: Secondary | ICD-10-CM

## 2015-07-10 DIAGNOSIS — Z7901 Long term (current) use of anticoagulants: Secondary | ICD-10-CM | POA: Diagnosis not present

## 2015-07-10 DIAGNOSIS — Z954 Presence of other heart-valve replacement: Secondary | ICD-10-CM

## 2015-07-10 DIAGNOSIS — I639 Cerebral infarction, unspecified: Secondary | ICD-10-CM

## 2015-07-10 DIAGNOSIS — Z952 Presence of prosthetic heart valve: Secondary | ICD-10-CM

## 2015-07-10 DIAGNOSIS — I6350 Cerebral infarction due to unspecified occlusion or stenosis of unspecified cerebral artery: Secondary | ICD-10-CM

## 2015-07-10 LAB — POCT INR: INR: 1.7

## 2015-07-24 ENCOUNTER — Ambulatory Visit (INDEPENDENT_AMBULATORY_CARE_PROVIDER_SITE_OTHER): Payer: Commercial Managed Care - HMO | Admitting: Pharmacist

## 2015-07-24 DIAGNOSIS — I6350 Cerebral infarction due to unspecified occlusion or stenosis of unspecified cerebral artery: Secondary | ICD-10-CM

## 2015-07-24 DIAGNOSIS — Z954 Presence of other heart-valve replacement: Secondary | ICD-10-CM | POA: Diagnosis not present

## 2015-07-24 DIAGNOSIS — I359 Nonrheumatic aortic valve disorder, unspecified: Secondary | ICD-10-CM

## 2015-07-24 DIAGNOSIS — Z7901 Long term (current) use of anticoagulants: Secondary | ICD-10-CM

## 2015-07-24 DIAGNOSIS — I639 Cerebral infarction, unspecified: Secondary | ICD-10-CM | POA: Diagnosis not present

## 2015-07-24 DIAGNOSIS — Z952 Presence of prosthetic heart valve: Secondary | ICD-10-CM

## 2015-07-24 LAB — POCT INR: INR: 1.7

## 2015-08-07 ENCOUNTER — Ambulatory Visit (INDEPENDENT_AMBULATORY_CARE_PROVIDER_SITE_OTHER): Payer: Commercial Managed Care - HMO | Admitting: Pharmacist

## 2015-08-07 DIAGNOSIS — Z7901 Long term (current) use of anticoagulants: Secondary | ICD-10-CM

## 2015-08-07 DIAGNOSIS — I639 Cerebral infarction, unspecified: Secondary | ICD-10-CM

## 2015-08-07 DIAGNOSIS — I359 Nonrheumatic aortic valve disorder, unspecified: Secondary | ICD-10-CM

## 2015-08-07 DIAGNOSIS — Z952 Presence of prosthetic heart valve: Secondary | ICD-10-CM

## 2015-08-07 DIAGNOSIS — I6350 Cerebral infarction due to unspecified occlusion or stenosis of unspecified cerebral artery: Secondary | ICD-10-CM

## 2015-08-07 DIAGNOSIS — Z954 Presence of other heart-valve replacement: Secondary | ICD-10-CM

## 2015-08-07 LAB — POCT INR: INR: 3.4

## 2015-08-22 ENCOUNTER — Encounter: Payer: Self-pay | Admitting: Gastroenterology

## 2015-08-22 ENCOUNTER — Other Ambulatory Visit (INDEPENDENT_AMBULATORY_CARE_PROVIDER_SITE_OTHER): Payer: Commercial Managed Care - HMO

## 2015-08-22 ENCOUNTER — Ambulatory Visit (INDEPENDENT_AMBULATORY_CARE_PROVIDER_SITE_OTHER): Payer: Commercial Managed Care - HMO | Admitting: Gastroenterology

## 2015-08-22 VITALS — BP 136/64 | HR 80 | Ht 62.0 in | Wt 160.0 lb

## 2015-08-22 DIAGNOSIS — Z7901 Long term (current) use of anticoagulants: Secondary | ICD-10-CM

## 2015-08-22 DIAGNOSIS — D509 Iron deficiency anemia, unspecified: Secondary | ICD-10-CM | POA: Diagnosis not present

## 2015-08-22 DIAGNOSIS — Z86010 Personal history of colonic polyps: Secondary | ICD-10-CM

## 2015-08-22 LAB — IBC PANEL
IRON: 30 ug/dL — AB (ref 42–145)
SATURATION RATIOS: 5.1 % — AB (ref 20.0–50.0)
TRANSFERRIN: 424 mg/dL — AB (ref 212.0–360.0)

## 2015-08-22 LAB — CBC WITH DIFFERENTIAL/PLATELET
BASOS ABS: 0 10*3/uL (ref 0.0–0.1)
BASOS PCT: 0.3 % (ref 0.0–3.0)
EOS ABS: 0.3 10*3/uL (ref 0.0–0.7)
EOS PCT: 2.8 % (ref 0.0–5.0)
HCT: 38.7 % (ref 36.0–46.0)
HEMOGLOBIN: 12.5 g/dL (ref 12.0–15.0)
LYMPHS PCT: 18.2 % (ref 12.0–46.0)
Lymphs Abs: 2.2 10*3/uL (ref 0.7–4.0)
MCHC: 32.4 g/dL (ref 30.0–36.0)
MCV: 79.7 fl (ref 78.0–100.0)
MONOS PCT: 7.8 % (ref 3.0–12.0)
Monocytes Absolute: 1 10*3/uL (ref 0.1–1.0)
NEUTROS ABS: 8.7 10*3/uL — AB (ref 1.4–7.7)
NEUTROS PCT: 70.9 % (ref 43.0–77.0)
Platelets: 361 10*3/uL (ref 150.0–400.0)
RBC: 4.85 Mil/uL (ref 3.87–5.11)
RDW: 15.7 % — ABNORMAL HIGH (ref 11.5–15.5)
WBC: 12.3 10*3/uL — ABNORMAL HIGH (ref 4.0–10.5)

## 2015-08-22 LAB — FERRITIN: FERRITIN: 7.7 ng/mL — AB (ref 10.0–291.0)

## 2015-08-22 NOTE — Progress Notes (Signed)
HPI :  INTAKE VISIT 60 y/o female seen in consultation for iron deficiency anemia, new patient to this clinic. Labs reviewed from 02/18/2015 (not listed in Epic, faxed from [facility]) showed Hgb of 11.5, MCV 73, with low iron levels and low iron sat.  She is no longer menstruating and is postmenopausal. She denies any blood in the stools. She has some irregular stools, does not have a BM daily, passes hard balls of stools and endorses some constipation in general. She thinks it has been ongoing for a while since her gallbladder was removed, in 2015 time frame. No abdominal pains, but can have some bloating and "knot" in her stomach at times, in the mid abdomen. She is eating okay. No nausea or vomiting. She has no difficulty swallowing. No weight loss, she is gaining weight.   She has been on coumadin since 2006. She had a CVA, and subsequently had aortic heart valve replaced (mechanical St. Jude valve) as well as coronary artery bypass surgery in Sept 2006. Followed by cardiology also has switched her cardiology care to Dr. Meda Coffee recently due to retirement of her former cardiologist. She has been on lovenox bridge in the past when holding her coumadin for other procedures.   She has never had a prior colonoscopy. She thinks she may have had a prior endoscopy, but sounds like TEE, she does not know the details. She is a prior tobacco user but quit in 2006. No FH of CRC.   SINCE LAST VISIT:  The patient underwent EGD and colonoscopy on December 6th for iron deficiency and CRC screening. She had a large 3cm tubulovillous adenoma removed from the ascending colon in piecemeal and had to resume anticoagulation (lovenox) after the procedure. She was hospitalized a few days after the procedure for GI bleeding. Colonoscopy on 12/14 showed large ulcerated polypectomy site which was closed with multiple hemostasis clips and treated with epinephrine. No other pathology was noted on colonoscopy to cause anemia. Her  EGD was remarkable for a hiatal hernia but otherwise normal. She is here for follow up today  She reports her bowel habits are regular since the last visit. She denies any blood in the stools. No abdominal pains. She is now on coumadin again. She reports she is no longer on any iron supplementation. She in general is feeling well without any complaints. We discussed her prior endoscopies. It is recommended to follow up with a repeat colonoscopy 3-6 months from her last exam, however she is very adament she cannot go through another exam at this time, mainly she states due to coming off her coumadin and using lovenox bridge, it has been difficult to get her coumadin levels to stay therapeutic and causes anxiety for her. She otherwise has no complaints at this time.   Endoscopic history: Colonoscopy 06/26/15 - large ulcer at ascending polypectomy site with adherent clots - treated with clips x 7 and EPI x 8.5 cc total. Colonoscopy 06/18/15 - sessile polyp measuring 30 mm in size was found in the ascending colon. Removed in piecemeal, clips placed, tattoo placed. Path was tubulovillous adenoma without HGD EGD - 06/18/15 - hiatal hernia  Past Medical History  Diagnosis Date  . Warfarin anticoagulation     coumadin therapy  . Hypertension   . Hx of CABG     2006,LIMA to LAD, SVG to diagonal  . 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 Dyslipidemia   . CAD (coronary artery  disease)   . Heart valve replaced by other means 03/2005    bentall procedure.#21 st.jude mechanical vavle conduit with re- implantation of coronaries. good valve function ..echo 10/09,mechanical valve working well.. echo 10/11.  . S/P aortic valve replacement     Bental procedure,#21 St. Jude mechanical valve conduit with reimplantation of the coronaries 2006 / valve working well, echo, October, 2009 / valve working well echo, October, 2011  . Indigestion 10/11  . Aortic stenosis     Aortic valve  replacement 2006  . Ejection fraction     60%, echo, 2009 / 55-60%, echo, October, 2011  . Ecchymosis     From Coumadin and aspirin  . Preop cardiovascular exam     Cardiac clearance for gallbladder surgery November, 2013  . Pericardial effusion     Insignificant small pericardial effusion seen on echo, November, 2013  . SOB (shortness of breath)     none now  . Heart murmur     hx  . Peripheral vascular disease ([place]) 06    blood clot -stroke  . GERD (gastroesophageal reflux disease)   . Gallstones   . Hypothyroidism   . Iron deficiency anemia   . Stroke [facility]) 2006    "left side weaker since" (06/25/2015)  . Status post colonoscopy with polypectomy     "bleeding; colonoscopy was ~ 06/18/2015"     Past Surgical History  Procedure Laterality Date  . Coronary artery bypass graft  2006  . Aortic valve replacement  2006  . Tonsillectomy    . Lumbar fusion  ~ 1973  . Laparoscopic cholecystectomy single port  07/21/2012    Procedure: LAPAROSCOPIC CHOLECYSTECTOMY SINGLE PORT;  Surgeon: Adin Hector, MD;  Location: [place];  Service: General;  Laterality: N/A;  . Cardiac valve replacement    . Back surgery    . Cesarean section  1985  . Cardiac catheterization  ~ 2006  . Colonoscopy w/ biopsies and polypectomy  06/18/2015  . Esophagogastroduodenoscopy  06/18/2015  . Colonoscopy N/A 06/26/2015    Procedure: COLONOSCOPY;  Surgeon: Gatha Mayer, MD;  Location: [place];  Service: Endoscopy;  Laterality: N/A;   Family History  Problem Relation Age of Onset  . Stroke Mother   . Heart disease Mother   . Alcohol abuse Father   . Hypertension Father   . Stroke Father   . Hyperlipidemia Father   . Stroke Brother     she has three and at least one of them has had a stroke  . Colon cancer Neg Hx    Social History  Substance Use Topics  . Smoking status: Former Smoker -- 1.00 packs/day for 25 years    Types: Cigarettes    Quit date: 07/15/2004  . Smokeless tobacco: Never Used   . Alcohol Use: No   Current Outpatient Prescriptions  Medication Sig Dispense Refill  . Calcium Carbonate-Vitamin D (CALCIUM 600+D) 600-400 MG-UNIT per tablet Take 1 tablet by mouth 2 (two) times daily.    Marland Kitchen enoxaparin (LOVENOX) 80 MG/0.8ML injection Inject 0.8 mLs (80 mg total) into the skin every 12 (twelve) hours. As Instructed by Coumadin Clinic 20 Syringe 0  . levothyroxine (SYNTHROID, LEVOTHROID) 50 MCG tablet Take 50 mcg by mouth daily before breakfast.    . omeprazole (PRILOSEC) 20 MG capsule Take 20 mg by mouth daily.      . potassium chloride (MICRO-K) 10 MEQ CR capsule TAKE 1 TABLET BY MOUTH EVERY DAY 15 capsule 0  . Simethicone (PHAZYME  PO) Take 250 mg by mouth daily as needed (gas).     . simvastatin (ZOCOR) 40 MG tablet Take 1 tablet (40 mg total) by mouth at bedtime. 15 tablet 0  . triazolam (HALCION) 0.25 MG tablet Take 0.25 mg by mouth at bedtime.    Marland Kitchen warfarin (COUMADIN) 3 MG tablet Take 1 tablet (3 mg total) by mouth one time only at 6 PM. 40 tablet 3  . [DISCONTINUED] KLOR-CON M10 10 MEQ tablet TAKE 1 TABLET BY MOUTH EVERY DAY 30 tablet 0   No current facility-administered medications for this visit.   No Known Allergies   Review of Systems: All systems reviewed and negative except where noted in HPI.   Lab Results  Component Value Date   WBC 5.9 06/29/2015   HGB 9.6* 06/29/2015   HCT 29.1* 06/29/2015   MCV 84.3 06/29/2015   PLT 270 06/29/2015      Physical Exam: BP 136/64 mmHg  Pulse 80  Ht 5\' 2"  (1.575 m)  Wt 160 lb (72.576 kg)  BMI 29.26 kg/m2 Constitutional: Pleasant,well-developed, female in no acute distress. HEENT: Normocephalic and atraumatic. Conjunctivae are normal. No scleral icterus. Neck supple.  Cardiovascular: Normal rate, regular rhythm. Mechanical heart sounds Pulmonary/chest: Effort normal and breath sounds normal. No wheezing, rales or rhonchi. Abdominal: Soft, nondistended, nontender. Bowel sounds active throughout. There are no  masses palpable. No hepatomegaly. Extremities: no edema Lymphadenopathy: No cervical adenopathy noted. Neurological: Alert and oriented to person place and time. Skin: Skin is warm and dry. No rashes noted. Psychiatric: Normal mood and affect. Behavior is normal.   ASSESSMENT AND PLAN: 60 y/o female with history of CVA, who is s/p aortic valve replacement and CABG with mechanical aortic valve, who presented with a mild iron deficiency anemia at our last visit which was a new diagnosis. She is a tobacco user and had not had CRC screening prior to that visit.   She underwent EGD and colonoscopy In December as outlined above, she had to stay on Lovenox following polypectomy of large ascending colon polyp. She unfortunately had a post-polypectomy GI bleed which required hospitalization and endoscopic therapy. She otherwise had no clear source of pathology to cause her iron deficiency on colonoscopy and EGD.   We discussed her endoscopy results. The polyp was tubulovillous. It is recommended to perform a surveillance colonoscopy around 3 months post-polypectomy for this type of lesion to ensure no residual adenomatous tissue given the size of this lesion. Given her complication and issues with anticoagulation, she was quite hesitant to repeat a colonoscopy, at least right now. I counseled her that this procedure may have likely prevented her from getting colon cancer, and without further surveillance colonoscopies, she is putting herself at risk for colon cancer. I understand her concerns about anticoagulation and bleeding risk however, and she would need to go on a lovenox bridge again for another procedure. However, it is unlikely she has any significant residual polyp which would require a large polypectomy like she had last time, and her risk for postpolypectomy bleeding again I would think would be much lower. She wishes to think about this for now, and will consider an exam in a few months. She  understands my recommendation to do it now but does not think she can handle it right now.   Otherwise, regarding her iron deficiency, I did not see a clear source of EGD and colonoscopy. She was taken off oral iron for the past month or so she thinks, so will  check her CBC and iron stores. If she remains iron deficient at this time, will consider capsule study. She is hesitant to go through further workup right now but will consider it. We will contact her with the results of her testing. She agreed.   [place] Cellar, MD [facility] Pager [phone]

## 2015-08-22 NOTE — Patient Instructions (Signed)
Your physician has requested that you go to the basement for  lab work before leaving today.   

## 2015-08-28 ENCOUNTER — Ambulatory Visit (INDEPENDENT_AMBULATORY_CARE_PROVIDER_SITE_OTHER): Payer: Commercial Managed Care - HMO | Admitting: Pharmacist

## 2015-08-28 DIAGNOSIS — I639 Cerebral infarction, unspecified: Secondary | ICD-10-CM

## 2015-08-28 DIAGNOSIS — Z954 Presence of other heart-valve replacement: Secondary | ICD-10-CM | POA: Diagnosis not present

## 2015-08-28 DIAGNOSIS — Z7901 Long term (current) use of anticoagulants: Secondary | ICD-10-CM

## 2015-08-28 DIAGNOSIS — I6350 Cerebral infarction due to unspecified occlusion or stenosis of unspecified cerebral artery: Secondary | ICD-10-CM

## 2015-08-28 DIAGNOSIS — I359 Nonrheumatic aortic valve disorder, unspecified: Secondary | ICD-10-CM | POA: Diagnosis not present

## 2015-08-28 DIAGNOSIS — Z952 Presence of prosthetic heart valve: Secondary | ICD-10-CM

## 2015-08-28 LAB — POCT INR: INR: 1.8

## 2015-09-18 ENCOUNTER — Ambulatory Visit (INDEPENDENT_AMBULATORY_CARE_PROVIDER_SITE_OTHER): Payer: Commercial Managed Care - HMO | Admitting: *Deleted

## 2015-09-18 DIAGNOSIS — I639 Cerebral infarction, unspecified: Secondary | ICD-10-CM | POA: Diagnosis not present

## 2015-09-18 DIAGNOSIS — I359 Nonrheumatic aortic valve disorder, unspecified: Secondary | ICD-10-CM

## 2015-09-18 DIAGNOSIS — I6350 Cerebral infarction due to unspecified occlusion or stenosis of unspecified cerebral artery: Secondary | ICD-10-CM

## 2015-09-18 DIAGNOSIS — Z954 Presence of other heart-valve replacement: Secondary | ICD-10-CM

## 2015-09-18 DIAGNOSIS — Z7901 Long term (current) use of anticoagulants: Secondary | ICD-10-CM

## 2015-09-18 DIAGNOSIS — Z952 Presence of prosthetic heart valve: Secondary | ICD-10-CM

## 2015-09-18 LAB — POCT INR: INR: 1.7

## 2015-09-20 ENCOUNTER — Encounter: Payer: Self-pay | Admitting: Gastroenterology

## 2015-10-09 ENCOUNTER — Ambulatory Visit (INDEPENDENT_AMBULATORY_CARE_PROVIDER_SITE_OTHER): Payer: Commercial Managed Care - HMO | Admitting: *Deleted

## 2015-10-09 DIAGNOSIS — I6350 Cerebral infarction due to unspecified occlusion or stenosis of unspecified cerebral artery: Secondary | ICD-10-CM

## 2015-10-09 DIAGNOSIS — Z7901 Long term (current) use of anticoagulants: Secondary | ICD-10-CM | POA: Diagnosis not present

## 2015-10-09 DIAGNOSIS — Z952 Presence of prosthetic heart valve: Secondary | ICD-10-CM

## 2015-10-09 DIAGNOSIS — I639 Cerebral infarction, unspecified: Secondary | ICD-10-CM

## 2015-10-09 DIAGNOSIS — Z954 Presence of other heart-valve replacement: Secondary | ICD-10-CM | POA: Diagnosis not present

## 2015-10-09 DIAGNOSIS — I359 Nonrheumatic aortic valve disorder, unspecified: Secondary | ICD-10-CM | POA: Diagnosis not present

## 2015-10-09 LAB — POCT INR: INR: 1.7

## 2015-10-30 ENCOUNTER — Ambulatory Visit (INDEPENDENT_AMBULATORY_CARE_PROVIDER_SITE_OTHER): Payer: Commercial Managed Care - HMO | Admitting: *Deleted

## 2015-10-30 DIAGNOSIS — I639 Cerebral infarction, unspecified: Secondary | ICD-10-CM | POA: Diagnosis not present

## 2015-10-30 DIAGNOSIS — I6350 Cerebral infarction due to unspecified occlusion or stenosis of unspecified cerebral artery: Secondary | ICD-10-CM

## 2015-10-30 DIAGNOSIS — Z7901 Long term (current) use of anticoagulants: Secondary | ICD-10-CM

## 2015-10-30 DIAGNOSIS — Z954 Presence of other heart-valve replacement: Secondary | ICD-10-CM

## 2015-10-30 DIAGNOSIS — I359 Nonrheumatic aortic valve disorder, unspecified: Secondary | ICD-10-CM

## 2015-10-30 DIAGNOSIS — Z952 Presence of prosthetic heart valve: Secondary | ICD-10-CM

## 2015-10-30 LAB — POCT INR: INR: 2.7

## 2015-11-20 ENCOUNTER — Ambulatory Visit (INDEPENDENT_AMBULATORY_CARE_PROVIDER_SITE_OTHER): Payer: Commercial Managed Care - HMO | Admitting: *Deleted

## 2015-11-20 DIAGNOSIS — Z952 Presence of prosthetic heart valve: Secondary | ICD-10-CM

## 2015-11-20 DIAGNOSIS — I6350 Cerebral infarction due to unspecified occlusion or stenosis of unspecified cerebral artery: Secondary | ICD-10-CM

## 2015-11-20 DIAGNOSIS — I639 Cerebral infarction, unspecified: Secondary | ICD-10-CM

## 2015-11-20 DIAGNOSIS — Z7901 Long term (current) use of anticoagulants: Secondary | ICD-10-CM

## 2015-11-20 DIAGNOSIS — I359 Nonrheumatic aortic valve disorder, unspecified: Secondary | ICD-10-CM | POA: Diagnosis not present

## 2015-11-20 DIAGNOSIS — Z954 Presence of other heart-valve replacement: Secondary | ICD-10-CM

## 2015-11-20 LAB — POCT INR: INR: 3.2

## 2015-12-11 ENCOUNTER — Ambulatory Visit (INDEPENDENT_AMBULATORY_CARE_PROVIDER_SITE_OTHER): Payer: Commercial Managed Care - HMO | Admitting: *Deleted

## 2015-12-11 DIAGNOSIS — Z7901 Long term (current) use of anticoagulants: Secondary | ICD-10-CM

## 2015-12-11 DIAGNOSIS — I639 Cerebral infarction, unspecified: Secondary | ICD-10-CM

## 2015-12-11 DIAGNOSIS — I359 Nonrheumatic aortic valve disorder, unspecified: Secondary | ICD-10-CM | POA: Diagnosis not present

## 2015-12-11 DIAGNOSIS — I6350 Cerebral infarction due to unspecified occlusion or stenosis of unspecified cerebral artery: Secondary | ICD-10-CM

## 2015-12-11 DIAGNOSIS — Z954 Presence of other heart-valve replacement: Secondary | ICD-10-CM | POA: Diagnosis not present

## 2015-12-11 DIAGNOSIS — Z952 Presence of prosthetic heart valve: Secondary | ICD-10-CM

## 2015-12-11 LAB — POCT INR: INR: 2.9

## 2016-01-01 ENCOUNTER — Other Ambulatory Visit: Payer: Self-pay | Admitting: *Deleted

## 2016-01-01 MED ORDER — WARFARIN SODIUM 3 MG PO TABS: 3.0000 mg | ORAL_TABLET | ORAL | Status: DC

## 2016-01-08 ENCOUNTER — Ambulatory Visit (INDEPENDENT_AMBULATORY_CARE_PROVIDER_SITE_OTHER): Payer: Commercial Managed Care - HMO | Admitting: *Deleted

## 2016-01-08 DIAGNOSIS — Z954 Presence of other heart-valve replacement: Secondary | ICD-10-CM

## 2016-01-08 DIAGNOSIS — I359 Nonrheumatic aortic valve disorder, unspecified: Secondary | ICD-10-CM

## 2016-01-08 DIAGNOSIS — I639 Cerebral infarction, unspecified: Secondary | ICD-10-CM | POA: Diagnosis not present

## 2016-01-08 DIAGNOSIS — Z7901 Long term (current) use of anticoagulants: Secondary | ICD-10-CM

## 2016-01-08 DIAGNOSIS — Z952 Presence of prosthetic heart valve: Secondary | ICD-10-CM

## 2016-01-08 DIAGNOSIS — I6350 Cerebral infarction due to unspecified occlusion or stenosis of unspecified cerebral artery: Secondary | ICD-10-CM

## 2016-01-08 LAB — POCT INR: INR: 3.4

## 2016-01-29 ENCOUNTER — Ambulatory Visit (INDEPENDENT_AMBULATORY_CARE_PROVIDER_SITE_OTHER): Payer: Commercial Managed Care - HMO | Admitting: Pharmacist

## 2016-01-29 DIAGNOSIS — I6350 Cerebral infarction due to unspecified occlusion or stenosis of unspecified cerebral artery: Secondary | ICD-10-CM

## 2016-01-29 DIAGNOSIS — I359 Nonrheumatic aortic valve disorder, unspecified: Secondary | ICD-10-CM

## 2016-01-29 DIAGNOSIS — Z954 Presence of other heart-valve replacement: Secondary | ICD-10-CM | POA: Diagnosis not present

## 2016-01-29 DIAGNOSIS — Z7901 Long term (current) use of anticoagulants: Secondary | ICD-10-CM

## 2016-01-29 DIAGNOSIS — I639 Cerebral infarction, unspecified: Secondary | ICD-10-CM | POA: Diagnosis not present

## 2016-01-29 DIAGNOSIS — Z952 Presence of prosthetic heart valve: Secondary | ICD-10-CM

## 2016-01-29 LAB — POCT INR: INR: 2.6

## 2016-02-20 ENCOUNTER — Encounter (INDEPENDENT_AMBULATORY_CARE_PROVIDER_SITE_OTHER): Payer: Self-pay

## 2016-02-20 ENCOUNTER — Ambulatory Visit (INDEPENDENT_AMBULATORY_CARE_PROVIDER_SITE_OTHER): Payer: Commercial Managed Care - HMO | Admitting: *Deleted

## 2016-02-20 DIAGNOSIS — I359 Nonrheumatic aortic valve disorder, unspecified: Secondary | ICD-10-CM

## 2016-02-20 DIAGNOSIS — Z952 Presence of prosthetic heart valve: Secondary | ICD-10-CM

## 2016-02-20 DIAGNOSIS — I6350 Cerebral infarction due to unspecified occlusion or stenosis of unspecified cerebral artery: Secondary | ICD-10-CM

## 2016-02-20 DIAGNOSIS — Z7901 Long term (current) use of anticoagulants: Secondary | ICD-10-CM

## 2016-02-20 DIAGNOSIS — Z954 Presence of other heart-valve replacement: Secondary | ICD-10-CM | POA: Diagnosis not present

## 2016-02-20 DIAGNOSIS — I639 Cerebral infarction, unspecified: Secondary | ICD-10-CM | POA: Diagnosis not present

## 2016-02-20 LAB — POCT INR: INR: 3.1

## 2016-03-18 ENCOUNTER — Ambulatory Visit (INDEPENDENT_AMBULATORY_CARE_PROVIDER_SITE_OTHER): Payer: Commercial Managed Care - HMO | Admitting: *Deleted

## 2016-03-18 ENCOUNTER — Encounter (INDEPENDENT_AMBULATORY_CARE_PROVIDER_SITE_OTHER): Payer: Self-pay

## 2016-03-18 DIAGNOSIS — I6350 Cerebral infarction due to unspecified occlusion or stenosis of unspecified cerebral artery: Secondary | ICD-10-CM

## 2016-03-18 DIAGNOSIS — I639 Cerebral infarction, unspecified: Secondary | ICD-10-CM | POA: Diagnosis not present

## 2016-03-18 DIAGNOSIS — Z954 Presence of other heart-valve replacement: Secondary | ICD-10-CM

## 2016-03-18 DIAGNOSIS — I359 Nonrheumatic aortic valve disorder, unspecified: Secondary | ICD-10-CM | POA: Diagnosis not present

## 2016-03-18 DIAGNOSIS — Z7901 Long term (current) use of anticoagulants: Secondary | ICD-10-CM | POA: Diagnosis not present

## 2016-03-18 DIAGNOSIS — Z952 Presence of prosthetic heart valve: Secondary | ICD-10-CM

## 2016-03-18 LAB — POCT INR: INR: 2.5

## 2016-04-15 ENCOUNTER — Ambulatory Visit (INDEPENDENT_AMBULATORY_CARE_PROVIDER_SITE_OTHER): Payer: Commercial Managed Care - HMO | Admitting: *Deleted

## 2016-04-15 ENCOUNTER — Encounter (INDEPENDENT_AMBULATORY_CARE_PROVIDER_SITE_OTHER): Payer: Self-pay

## 2016-04-15 DIAGNOSIS — I639 Cerebral infarction, unspecified: Secondary | ICD-10-CM

## 2016-04-15 DIAGNOSIS — I6350 Cerebral infarction due to unspecified occlusion or stenosis of unspecified cerebral artery: Secondary | ICD-10-CM

## 2016-04-15 DIAGNOSIS — Z952 Presence of prosthetic heart valve: Secondary | ICD-10-CM | POA: Diagnosis not present

## 2016-04-15 DIAGNOSIS — I359 Nonrheumatic aortic valve disorder, unspecified: Secondary | ICD-10-CM

## 2016-04-15 DIAGNOSIS — Z7901 Long term (current) use of anticoagulants: Secondary | ICD-10-CM | POA: Diagnosis not present

## 2016-04-15 LAB — POCT INR: INR: 2.5

## 2016-04-24 NOTE — Progress Notes (Signed)
Need orders in [facility] for surgery on 05/15/16.  Thank You.

## 2016-04-25 ENCOUNTER — Other Ambulatory Visit: Payer: Self-pay | Admitting: Cardiology

## 2016-04-27 ENCOUNTER — Telehealth: Payer: Self-pay

## 2016-04-27 ENCOUNTER — Other Ambulatory Visit: Payer: Self-pay | Admitting: Internal Medicine

## 2016-04-27 DIAGNOSIS — Z1231 Encounter for screening mammogram for malignant neoplasm of breast: Secondary | ICD-10-CM

## 2016-04-27 NOTE — Telephone Encounter (Signed)
Pt called states she is scheduled for hernia surgery on 05/15/16.  Pt has an AVR and hx CVA, has bridged with Lovenox in the past for surgical procedures.  Will forward msg to Fuller Canada, PharmD for Coumadin Clearance.  Needs to hold Coumadin 5 days prior to surgery.  Rescheduled pt's follow-up appt from 05/20/16 to 05/08/16 for INR and Lovenox bridging instructions.

## 2016-04-27 NOTE — Telephone Encounter (Signed)
Pt takes Coumadin for AVR, also has history of CVA. Will need Lovenox bridging in clinic for upcoming hernia surgery. Pt has used Lovenox many times in the past. Coumadin clinic to coordinate.

## 2016-04-29 ENCOUNTER — Telehealth: Payer: Self-pay | Admitting: *Deleted

## 2016-04-29 DIAGNOSIS — Z7901 Long term (current) use of anticoagulants: Secondary | ICD-10-CM

## 2016-04-29 DIAGNOSIS — Z01810 Encounter for preprocedural cardiovascular examination: Secondary | ICD-10-CM

## 2016-04-29 DIAGNOSIS — Z952 Presence of prosthetic heart valve: Secondary | ICD-10-CM

## 2016-04-29 DIAGNOSIS — I5032 Chronic diastolic (congestive) heart failure: Secondary | ICD-10-CM

## 2016-04-29 NOTE — Telephone Encounter (Signed)
-----   Message from Dorothy Spark, MD sent at 04/29/2016  4:26 PM EDT ----- This patient is scheduled for ventral hernia repair, her transaortic gradients across prosthetic valve were elevated on last year echocardiogram, I wanted to repeat echo prior to this year appointment that is not until December. Please reschedule her echo as soon as possible followed by clinic visit by one of the PAs for preoperative evaluation prior to her ventral hernia repair on November 1. Thank you, Ena Dawley

## 2016-04-29 NOTE — Telephone Encounter (Signed)
Left a message on both contact numbers in pts chart, to call back to arrange an echo and follow-up appt with an Extender, prior to her scheduled surgery to repair her hernia.  This is a part of cardiac clearance  [facility]S, is requiring prior to the pts scheduled surgery.  Will place the order for echo in the system, and send [facility] a message to call the pt back and schedule her echo ASAP and a follow-up appt with an Extender, for needed clearance for hernia repair on 05/15/16.

## 2016-04-30 ENCOUNTER — Telehealth: Payer: Self-pay | Admitting: Cardiology

## 2016-04-30 NOTE — Telephone Encounter (Signed)
Contacted the pt to inform her that we scheduled her to come in for her echo on 10/27 at 0830, same day as her coumadin clinic appt.  Also informed the pt that we have scheduled her to come and see Dayna Dunn PA-C for 10/30 at 0900, for completion of surgical clearance.  Pt verbalized understanding and agrees with this plan.

## 2016-04-30 NOTE — Telephone Encounter (Signed)
F/u Message  Pt states she received a call back from Rn. Please call back to discuss

## 2016-04-30 NOTE — Telephone Encounter (Signed)
NEw Message  Pt call requesting to speak with RN. Pt stated she wanted to make an appt to get clearance for a surgery on November. Pt stated she needs and echo clearance. Pt stated she received a VM instructing her to make an appt with a pt for and echo. Pt was informed next available echo. Pt was not satisfied and requesting to spek with RN. Please call back to discuss if pt needs PA appt or echo.

## 2016-05-08 ENCOUNTER — Ambulatory Visit (HOSPITAL_COMMUNITY): Payer: Commercial Managed Care - HMO | Attending: Cardiovascular Disease

## 2016-05-08 ENCOUNTER — Ambulatory Visit: Payer: Self-pay | Admitting: Surgery

## 2016-05-08 ENCOUNTER — Ambulatory Visit (INDEPENDENT_AMBULATORY_CARE_PROVIDER_SITE_OTHER): Payer: Commercial Managed Care - HMO | Admitting: *Deleted

## 2016-05-08 ENCOUNTER — Other Ambulatory Visit: Payer: Self-pay

## 2016-05-08 DIAGNOSIS — Z7901 Long term (current) use of anticoagulants: Secondary | ICD-10-CM | POA: Diagnosis not present

## 2016-05-08 DIAGNOSIS — Z01810 Encounter for preprocedural cardiovascular examination: Secondary | ICD-10-CM | POA: Diagnosis not present

## 2016-05-08 DIAGNOSIS — I359 Nonrheumatic aortic valve disorder, unspecified: Secondary | ICD-10-CM

## 2016-05-08 DIAGNOSIS — I5032 Chronic diastolic (congestive) heart failure: Secondary | ICD-10-CM | POA: Diagnosis not present

## 2016-05-08 DIAGNOSIS — I639 Cerebral infarction, unspecified: Secondary | ICD-10-CM | POA: Diagnosis not present

## 2016-05-08 DIAGNOSIS — I6350 Cerebral infarction due to unspecified occlusion or stenosis of unspecified cerebral artery: Secondary | ICD-10-CM

## 2016-05-08 DIAGNOSIS — Z952 Presence of prosthetic heart valve: Secondary | ICD-10-CM

## 2016-05-08 LAB — POCT INR: INR: 2.3

## 2016-05-08 MED ORDER — ENOXAPARIN SODIUM 80 MG/0.8ML ~~LOC~~ SOLN: 80.0000 mg | Freq: Two times a day (BID) | SUBCUTANEOUS | 1 refills | Status: DC

## 2016-05-08 NOTE — H&P (Signed)
Connye Burkitt. Yonan 04/08/2016 1:39 PM Location: [facility] Patient #: [id number] DOB: 10/22/1955 Married / Language: English / Race: White Female   History of Present Illness Rodman Key B. Hassell Done MD; 04/08/2016 2:37 PM) The patient is a 60 year old female who presents with a complaint of Abdominal distention. She has an upper midline hernia related to her single site lap chole (Gross 2015). When she stand it sticks out prominently. No history of obstruction.  She is on coumadin for a mechanical heart valve (aortic) place by Dr. Cyndia Bent. I explained lap assisted ventral hernia. She is referred by Dr. Zada Girt.   I explained lap assisted ventral hernia repair. She will need Lovenox bridging. She is aware of the risks of complications (bleeding after colonoscopy)   Other Problems Davy Pique Bynum, [place]; 04/08/2016 1:40 PM) Cerebrovascular Accident Cholelithiasis Gastroesophageal Reflux Disease Thyroid Disease  Past Surgical History Marjean Donna, CMA; 04/08/2016 1:40 PM) Cesarean Section - 1 Colon Polyp Removal - Colonoscopy Colon Polyp Removal - Open Coronary Artery Bypass Graft Gallbladder Surgery - Laparoscopic Spinal Surgery - Lower Back Valve Replacement  Diagnostic Studies History Marjean Donna, CMA; 04/08/2016 1:40 PM) Colonoscopy within last year Mammogram within last year  Allergies Davy Pique Bynum, CMA; 04/08/2016 1:40 PM) No Known Drug Allergies09/27/2017  Medication History (Sonya Bynum, CMA; 04/08/2016 1:41 PM) Furosemide (20MG  Tablet, Oral) Active. Simvastatin (40MG  Tablet, Oral) Active. Levothyroxine Sodium (50MCG Tablet, Oral) Active. Warfarin Sodium (3MG  Tablet, Oral) Active. Medications Reconciled  Social History Marjean Donna, CMA; 04/08/2016 1:40 PM) Tobacco use Former smoker.  Family History Marjean Donna, [place]; 04/08/2016 1:40 PM) Alcohol Abuse Father. Cerebrovascular Accident Brother, Mother. Heart Disease Mother. Heart disease in  female family member before age 46  Pregnancy / Birth History Marjean Donna, [place]; 04/08/2016 1:40 PM) Age at menarche 79 years. Age of menopause <46 Contraceptive History Oral contraceptives. Gravida 1 Maternal age 46-25 Para 1    Review of Systems ([place]; 04/08/2016 1:40 PM) General Present- Weight Gain. Not Present- Appetite Loss, Chills, Fatigue, Fever, Night Sweats and Weight Loss. Skin Not Present- Change in Wart/Mole, Dryness, Hives, Jaundice, New Lesions, Non-Healing Wounds, Rash and Ulcer. HEENT Present- Ringing in the Ears and Wears glasses/contact lenses. Not Present- Earache, Hearing Loss, Hoarseness, Nose Bleed, Oral Ulcers, Seasonal Allergies, Sinus Pain, Sore Throat, Visual Disturbances and Yellow Eyes. Respiratory Not Present- Bloody sputum, Chronic Cough, Difficulty Breathing, Snoring and Wheezing. Breast Not Present- Breast Mass, Breast Pain, Nipple Discharge and Skin Changes. Cardiovascular Not Present- Chest Pain, Difficulty Breathing Lying Down, Leg Cramps, Palpitations, Rapid Heart Rate, Shortness of Breath and Swelling of Extremities. Gastrointestinal Present- Bloating. Not Present- Abdominal Pain, Bloody Stool, Change in Bowel Habits, Chronic diarrhea, Constipation, Difficulty Swallowing, Excessive gas, Gets full quickly at meals, Hemorrhoids, Indigestion, Nausea, Rectal Pain and Vomiting. Female Genitourinary Not Present- Frequency, Nocturia, Painful Urination, Pelvic Pain and Urgency. Musculoskeletal Not Present- Back Pain, Joint Pain, Joint Stiffness, Muscle Pain, Muscle Weakness and Swelling of Extremities. Neurological Not Present- Decreased Memory, Fainting, Headaches, Numbness, Seizures, Tingling, Tremor, Trouble walking and Weakness. Psychiatric Not Present- Anxiety, Bipolar, Change in Sleep Pattern, Depression, Fearful and Frequent crying. Endocrine Present- Hair Changes. Not Present- Cold Intolerance, Excessive Hunger, Heat Intolerance, Hot  flashes and New Diabetes. Hematology Present- Blood Thinners. Not Present- Easy Bruising, Excessive bleeding, Gland problems, HIV and Persistent Infections.  Vitals (Sonya Bynum CMA; 04/08/2016 1:40 PM) 04/08/2016 1:40 PM Weight: 161 lb Height: 62in Body Surface Area: 1.74 m Body Mass Index: 29.45 kg/m  Temp.: 1F(Temporal)  Pulse:  78 (Regular)  BP: 134/80 (Sitting, Left Arm, Standard)       Physical Exam (Keynan Heffern B. Hassell Done MD; 04/08/2016 2:39 PM) The physical exam findings are as follows: Note:HEENT unremarkable Neck no bruits Chest clear Heart 3/6 SEM with click Abdomen about 8 cm soft protruding mass in the upper midline Ext FROM  Abdomen     Assessment & Plan Rodman Key B. Hassell Done MD; 04/08/2016 2:33 PM) VENTRAL HERNIA (K43.9) Impression: Ventral hernia at single site lap chole incision. Plan lap assisted ventral hernia repair. Will need to come off coumadin and have Lovenox bridge. She bled after her colonoscopy last year and took a week in the hospital with 2 units transfusion.

## 2016-05-08 NOTE — Progress Notes (Signed)
Cardiology Office Note    Date:  05/11/2016  ID:  [name], DOB 12-25-1955, MRN [id number] PCP:  Elizabeth Peaches, MD  Cardiologist:  Dr. Meda Coffee   Chief Complaint: pre-op evaluation  History of Present Illness:  Elizabeth Morton is a 60 y.o. 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 Her anticoagulation is being managed with Lovenox bridging through our Coumadin clinic. Dr. Meda Coffee advised repeat echo prior to surgery given h/o elevated gradients on last year's echo - 2D ccho 05/08/16 showed EF 60-65%, grade 1 DD, moderate aortic stenosis, transvalvular velocity increased compared to 2013 but unchanged from 2016 (69mmHg mean, 84mmHg peak), mod LAE, trivial pericardial effusion. Last CBC 08/2015 with Hgb 12.5, last Cr 1.08 in 06/2015.  She presents back to clinic today for pre-operative evaluation. She has not had any chest pain, SOB, chest pressure, dizziness, pre-syncope or syncope. No LEE, orthopnea. She has noticed some weight gain which she attributes to her hernia. She admits she hasn't been as active as she should be, but has not noticed any exertional limitation with ADLs and moderate exertion such as walking all the way from one end of [employer] to the other uninterrupted.   Past Medical History:  Diagnosis Date  . Aortic stenosis    Aortic valve replacement 2006  . CAD (coronary artery disease)    a. s/p CABG 2006 at time of AVR.  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 Dyslipidemia   . Gallstones   . GERD (gastroesophageal reflux disease)     . Hx of CABG    2006,LIMA to LAD, SVG to diagonal  . Hypertension   . Hypothyroidism   . Iron deficiency anemia   . Lower GI bleed 06/2015  . Pericardial effusion    Insignificant small pericardial effusion seen on echo, November, 2013  . Peripheral vascular disease ([place]) 06   blood clot -stroke  . S/P aortic valve replacement    a. Bentall procedure,#21 [employer]. Jude mechanical valve conduit with reimplantation of the coronaries 2006  . Status post colonoscopy with polypectomy    "bleeding; colonoscopy was ~ 06/18/2015"  . Warfarin anticoagulation    coumadin therapy    Past Surgical History:  Procedure Laterality Date  . AORTIC VALVE REPLACEMENT  2006  . BACK SURGERY    . CARDIAC CATHETERIZATION  ~ 2006  . CARDIAC VALVE REPLACEMENT    . CESAREAN SECTION  1985  . COLONOSCOPY N/A 06/26/2015   Procedure: COLONOSCOPY;  Surgeon: Elizabeth Mayer, MD;  Location: [facility];  Service: Endoscopy;  Laterality: N/A;  . COLONOSCOPY W/ BIOPSIES AND POLYPECTOMY  06/18/2015  . CORONARY ARTERY BYPASS GRAFT  2006  . ESOPHAGOGASTRODUODENOSCOPY  06/18/2015  . LAPAROSCOPIC CHOLECYSTECTOMY SINGLE PORT  07/21/2012   Procedure: LAPAROSCOPIC CHOLECYSTECTOMY SINGLE PORT;  Surgeon: Adin Hector, MD;  Location: [facility];  Service: General;  Laterality: N/A;  . LUMBAR FUSION  ~ 1973  . TONSILLECTOMY      Current Medications: Current Outpatient Prescriptions  Medication Sig Dispense Refill  . Calcium Carbonate-Vitamin D (CALCIUM 600+D) 600-400 MG-UNIT per tablet Take 1 tablet by mouth 2 (  two) times daily.    Marland Kitchen enoxaparin (LOVENOX) 80 MG/0.8ML injection Inject 0.8 mLs (80 mg total) into the skin every 12 (twelve) hours. 10 Syringe 1  . furosemide (LASIX) 20 MG tablet Take 1 tablet by mouth daily.    Marland Kitchen levothyroxine (SYNTHROID, LEVOTHROID) 50 MCG tablet Take 50 mcg by mouth daily before breakfast.    . omeprazole (PRILOSEC) 20 MG capsule Take 20 mg by mouth daily.      . potassium chloride (MICRO-K) 10 MEQ  CR capsule TAKE 1 TABLET BY MOUTH EVERY DAY 15 capsule 0  . Simethicone (PHAZYME PO) Take 250 mg by mouth daily as needed (gas).     . simvastatin (ZOCOR) 40 MG tablet Take 1 tablet (40 mg total) by mouth at bedtime. 15 tablet 0  . triazolam (HALCION) 0.25 MG tablet Take 0.25 mg by mouth at bedtime.    Marland Kitchen warfarin (COUMADIN) 3 MG tablet TAKE 1 TABLET (3 MG TOTAL) BY MOUTH AS DIRECTED. (Patient not taking: Reported on 05/11/2016) 40 tablet 3   No current facility-administered medications for this visit.      Allergies:   Review of patient's allergies indicates no known allergies.   Social History   Social History  . Marital status: Married    Spouse name: N/A  . Number of children: 1  . Years of education: N/A   Occupational History  . homemaker    Social History Main Topics  . Smoking status: Former Smoker    Packs/day: 1.00    Years: 25.00    Types: Cigarettes    Quit date: 07/15/2004  . Smokeless tobacco: Never Used  . Alcohol use No  . Drug use: No  . Sexual activity: No   Other Topics Concern  . None   Social History Narrative   She lives in [place] with her husband and son. She previously smoked 37-pack-years,but quit following her stroke in April of 2006. She denies any alcohol or drugs. She has not been routinely exercising.     Family History:  The patient's family history includes Alcohol abuse in her father; Heart disease in her mother; Hyperlipidemia in her father; Hypertension in her father; Stroke in her brother, father, and mother.   ROS:   Please see the history of present illness.  All other systems are reviewed and otherwise negative.    PHYSICAL EXAM:   VS:  BP 126/64   Pulse 82   Ht 5\' 2"  (1.575 m)   Wt 168 lb (76.2 kg) Comment: per pt 161lbs at home  BMI 30.73 kg/m   BMI: Body mass index is 30.73 kg/m. GEN: Well nourished, well developed WF, in no acute distress  HEENT: normocephalic, atraumatic Neck: no JVD, carotid bruits, or  masses Cardiac: RRR; crisp valve click, 2/6 SEM localized at RUSB, no rubs or gallops, no edema  Respiratory:  clear to auscultation bilaterally, normal work of breathing GI: soft, nontender, nondistended, + BS MS: no deformity or atrophy  Skin: warm and dry, no rash Neuro:  Alert and Oriented x 3, Strength and sensation are intact, follows commands Psych: euthymic mood, full affect  Wt Readings from Last 3 Encounters:  05/11/16 168 lb (76.2 kg)  08/22/15 160 lb (72.6 kg)  06/25/15 158 lb 4.8 oz (71.8 kg)      Studies/Labs Reviewed:   EKG:  EKG was ordered today and personally reviewed by me and demonstrates NSR 82bpm, nonspecific ST-T changes, similar to prior going back to 2010 even. (baseline wander noted)  Recent Labs: 06/25/2015: ALT 26 06/26/2015: BUN 21; Creatinine, Ser 1.08; Potassium 3.2; Sodium 140 08/22/2015: Hemoglobin 12.5; Platelets 361.0   Lipid Panel No results found for: CHOL, TRIG, HDL, CHOLHDL, VLDL, LDLCALC, LDLDIRECT  Additional studies/ records that were reviewed today include: Summarized above.      ASSESSMENT & PLAN:   1. Pre-op CV examination - doing well from cardiac standpoint. Transvalvular velocity is increased on her most recent echocardiogram compared to 2013, but unchanged from 2016. Reviewed with Dr. Meda Coffee. As she is asymptomatic, she does not require further cardiac testing at this time and is cleared for surgery. Has pre-op testing appt on 05/13/16. 2. Aortic stenosis s/p mechanical AVR - as above. Reviewed with patient to inform us of any change in her symptoms. Anticipate yearly surveillance of her valve gradients, at the ultimate discretion of Dr. Meda Coffee. Patient requested a copy of her echocardiogram today. She has already been given instructions for Coumadin -> Lovenox bridge for surgery from our Coumadin Clinic. Will need to resume as soon as safe per surgeon with bridging after surgery as well. We will be available if needed for any  perioperative issues. 3. CAD s/p CABG - no recent anginal sx. Not on ASA due to h/o bleeding issues while on concomitant warfarin (reviewed with Dr. Meda Coffee). Continue statin. At f/u OV would recommend obtaining a copy of f/u lipids (monitored by PCP) and considering titration to higher intensity statin. 4. Essential HTN - controlled.  Disposition: F/u with Dr. Meda Coffee in 3 months. Will forward this note to surgeon.   Medication Adjustments/Labs and Tests Ordered: Current medicines are reviewed at length with the patient today.  Concerns regarding medicines are outlined above. Medication changes, Labs and Tests ordered today are summarized above and listed in the Patient Instructions accessible in Encounters.   Raechel Ache PA-C  05/11/2016 9:13 AM    [address], [place], [place]  [address] Phone: [phone]; Fax: [phone]

## 2016-05-08 NOTE — Progress Notes (Signed)
Need orders in EPIC.  Surgery on 05/15/2016.  preop on 05/13/2016.  Thank You.

## 2016-05-08 NOTE — Patient Instructions (Addendum)
05/09/16: Last dose of Coumadin.  05/10/16: No Coumadin or Lovenox.  05/11/16: Inject Lovenox 80mg  in the fatty abdominal tissue at least 2 inches from the belly button twice a day about 12 hours apart, 8am and 8pm rotate sites. No Coumadin.  05/12/16: Inject Lovenox 80mg  in the fatty tissue every 12 hours, 8am and 8pm. No Coumadin.  05/13/16: Inject Lovenox 80mg  in the fatty tissue every 12 hours, 8am and 8pm. No Coumadin.  05/14/16: Inject Lovenox 80mg  in the fatty tissue in the morning at 8 am (No PM dose). No Coumadin.  05/15/16: Procedure Day - No Lovenox - Resume Coumadin in the evening or as directed by doctor (take an extra half tablet with usual dose for 2 days then resume normal dose).  05/16/16: Resume Lovenox inject in the fatty tissue every 12 hours and take Coumadin.  05/17/16: Inject Lovenox 80mg  in the fatty tissue every 12 hours and take Coumadin.  05/18/16: Inject Lovenox 80mg  in the fatty tissue every 12 hours and take Coumadin.  05/19/16: Inject Lovenox 80mg  in the fatty tissue every 12 hours and take Coumadin.  05/20/16: Inject Lovenox 80mg  in the fatty tissue every 12 hours and take Coumadin.  05/21/16: Coumadin appt to check INR.

## 2016-05-11 ENCOUNTER — Encounter: Payer: Self-pay | Admitting: Physician Assistant

## 2016-05-11 ENCOUNTER — Encounter (INDEPENDENT_AMBULATORY_CARE_PROVIDER_SITE_OTHER): Payer: Self-pay

## 2016-05-11 ENCOUNTER — Ambulatory Visit (INDEPENDENT_AMBULATORY_CARE_PROVIDER_SITE_OTHER): Payer: Commercial Managed Care - HMO | Admitting: Physician Assistant

## 2016-05-11 VITALS — BP 126/64 | HR 82 | Ht 62.0 in | Wt 168.0 lb

## 2016-05-11 DIAGNOSIS — I2510 Atherosclerotic heart disease of native coronary artery without angina pectoris: Secondary | ICD-10-CM | POA: Diagnosis not present

## 2016-05-11 DIAGNOSIS — I350 Nonrheumatic aortic (valve) stenosis: Secondary | ICD-10-CM | POA: Diagnosis not present

## 2016-05-11 DIAGNOSIS — Z952 Presence of prosthetic heart valve: Secondary | ICD-10-CM

## 2016-05-11 DIAGNOSIS — Z01810 Encounter for preprocedural cardiovascular examination: Secondary | ICD-10-CM | POA: Diagnosis not present

## 2016-05-11 DIAGNOSIS — I10 Essential (primary) hypertension: Secondary | ICD-10-CM

## 2016-05-11 NOTE — Patient Instructions (Addendum)
Medication Instructions:  Your physician recommends that you continue on your current medications as directed. Please refer to the Current Medication list given to you today.   Labwork: None ordered  Testing/Procedures: None ordered  Follow-Up: Your physician recommends that you schedule a follow-up appointment in: [address] [place]  Any Other Special Instructions Will Be Listed Below (If Applicable). You have been cleared for surgery, from a  Cardiac Standpoint.  We will notify Dr. Earlie Server office and let them know.  If you need a refill on your cardiac medications before your next appointment, please call your pharmacy.

## 2016-05-12 ENCOUNTER — Telehealth: Payer: Self-pay | Admitting: *Deleted

## 2016-05-12 NOTE — Patient Instructions (Signed)
Elizabeth Morton  05/12/2016   Your procedure is scheduled on: 05/15/2016    Report to [facility] Main  Entrance take [facility]  elevators to 3rd floor to  [place] at   1230pm  Call this number if you have problems the morning of surgery [phone]   Remember: ONLY 1 PERSON MAY GO WITH YOU TO SHORT STAY TO GET  READY MORNING OF YOUR SURGERY.  Do not eat food after midnite.  May have clear liquids from 12 midnite until 0800am then nothing by mouth.       Take these medicines the morning of surgery with A SIP OF WATER: Synthroid , Prilosec                                 You may not have any metal on your body including hair pins and              piercings  Do not wear jewelry, make-up, lotions, powders or perfumes, deodorant             Do not wear nail polish.  Do not shave  48 hours prior to surgery.              Do not bring valuables to the hospital. [place].  Contacts, dentures or bridgework may not be worn into surgery.  Leave suitcase in the car. After surgery it may be brought to your room.         Special Instructions: coughing and deep breathing exercises, leg exercises    CLEAR LIQUID DIET   Foods Allowed                                                                     Foods Excluded  Coffee and tea, regular and decaf                             liquids that you cannot  Plain Jell-O in any flavor                                             see through such as: Fruit ices (not with fruit pulp)                                     milk, soups, orange juice  Iced Popsicles                                    All solid food Carbonated beverages, regular and diet  Cranberry, grape and apple juices Sports drinks like Gatorade Lightly seasoned clear broth or consume(fat free) Sugar, honey syrup  Sample Menu Breakfast                                 Lunch                                     Supper Cranberry juice                    Beef broth                            Chicken broth Jell-O                                     Grape juice                           Apple juice Coffee or tea                        Jell-O                                      Popsicle                                                Coffee or tea                        Coffee or tea  _____________________________________________________________________                Please read over the following fact sheets you were given: _____________________________________________________________________             [facility] - Preparing for Surgery Before surgery, you can play an important role.  Because skin is not sterile, your skin needs to be as free of germs as possible.  You can reduce the number of germs on your skin by washing with CHG (chlorahexidine gluconate) soap before surgery.  CHG is an antiseptic cleaner which kills germs and bonds with the skin to continue killing germs even after washing. Please DO NOT use if you have an allergy to CHG or antibacterial soaps.  If your skin becomes reddened/irritated stop using the CHG and inform your nurse when you arrive at Short Stay. Do not shave (including legs and underarms) for at least 48 hours prior to the first CHG shower.  You may shave your face/neck. Please follow these instructions carefully:  1.  Shower with CHG Soap the night before surgery and the  morning of Surgery.  2.  If you choose to wash your hair, wash your hair first as usual with your  normal  shampoo.  3.  After you shampoo, rinse your hair and body thoroughly to remove the  shampoo.  4.  Use CHG as you would any other liquid soap.  You can apply chg directly  to the skin and wash                       Gently with a scrungie or clean washcloth.  5.  Apply the CHG Soap to your body ONLY FROM THE NECK DOWN.   Do not  use on face/ open                           Wound or open sores. Avoid contact with eyes, ears mouth and genitals (private parts).                       Wash face,  Genitals (private parts) with your normal soap.             6.  Wash thoroughly, paying special attention to the area where your surgery  will be performed.  7.  Thoroughly rinse your body with warm water from the neck down.  8.  DO NOT shower/wash with your normal soap after using and rinsing off  the CHG Soap.                9.  Pat yourself dry with a clean towel.            10.  Wear clean pajamas.            11.  Place clean sheets on your bed the night of your first shower and do not  sleep with pets. Day of Surgery : Do not apply any lotions/deodorants the morning of surgery.  Please wear clean clothes to the hospital/surgery center.  FAILURE TO FOLLOW THESE INSTRUCTIONS MAY RESULT IN THE CANCELLATION OF YOUR SURGERY PATIENT SIGNATURE_________________________________  NURSE SIGNATURE__________________________________  ________________________________________________________________________

## 2016-05-12 NOTE — Telephone Encounter (Signed)
Pt called to inform us that she missed her first dose of her Lovenox injection on 05/11/16 at 8am, she dis strtt that night with 8pm dose.  Advised that there is no way to make up for a missed Lovenox injection and to continue taking as instructed & she verbalized understanding. Educated her on the purpose of taking Lovenox every 12 hours while off Coumadin in preparation for procedure.  Also, reminded her not to take the pm dose on 05/14/16 so she will be able to have procedure on 05/15/16 & she verbalized understanding.

## 2016-05-13 ENCOUNTER — Encounter (HOSPITAL_COMMUNITY): Payer: Self-pay

## 2016-05-13 ENCOUNTER — Encounter (HOSPITAL_COMMUNITY)
Admission: RE | Admit: 2016-05-13 | Discharge: 2016-05-13 | Disposition: A | Discharge status: Home / Self Care | Payer: Commercial Managed Care - HMO | Source: Ambulatory Visit | Attending: Surgery | Admitting: Surgery

## 2016-05-13 HISTORY — DX: Personal history of other medical treatment: Z92.89

## 2016-05-13 LAB — PROTIME-INR
INR: 1.31
Prothrombin Time: 16.4 seconds — ABNORMAL HIGH (ref 11.4–15.2)

## 2016-05-13 LAB — BASIC METABOLIC PANEL
ANION GAP: 9 (ref 5–15)
BUN: 9 mg/dL (ref 6–20)
CALCIUM: 9.9 mg/dL (ref 8.9–10.3)
CO2: 27 mmol/L (ref 22–32)
Chloride: 105 mmol/L (ref 101–111)
Creatinine, Ser: 1.15 mg/dL — ABNORMAL HIGH (ref 0.44–1.00)
GFR calc Af Amer: 59 mL/min — ABNORMAL LOW (ref >60)
GFR calc non Af Amer: 51 mL/min — ABNORMAL LOW (ref >60)
GLUCOSE: 93 mg/dL (ref 65–99)
Potassium: 3.9 mmol/L (ref 3.5–5.1)
Sodium: 141 mmol/L (ref 135–145)

## 2016-05-13 LAB — CBC
HEMATOCRIT: 42.9 % (ref 36.0–46.0)
Hemoglobin: 14.1 g/dL (ref 12.0–15.0)
MCH: 27.8 pg (ref 26.0–34.0)
MCHC: 32.9 g/dL (ref 30.0–36.0)
MCV: 84.4 fL (ref 78.0–100.0)
Platelets: 351 10*3/uL (ref 150–400)
RBC: 5.08 MIL/uL (ref 3.87–5.11)
RDW: 13.9 % (ref 11.5–15.5)
WBC: 7.3 10*3/uL (ref 4.0–10.5)

## 2016-05-14 NOTE — H&P (Signed)
Elizabeth Morton. Holbein 04/08/2016 1:39 PM Location: [facility] Patient #: [id number] DOB: March 27, 1956 Married / Language: English / Race: White Female   History of Present Illness Elizabeth Key B. Hassell Done MD; 04/08/2016 2:37 PM) The patient is a 60 year old female who presents with a complaint of Abdominal distention. She has an upper midline hernia related to her single site lap chole (Gross 2015). When she stand it sticks out prominently. No history of obstruction.  She is on coumadin for a mechanical heart valve (aortic) place by Dr. Cyndia Bent. I explained lap assisted ventral hernia. She is referred by Dr. Zada Girt.   I explained lap assisted ventral hernia repair. She will need Lovenox bridging. She is aware of the risks of complications (bleeding after colonoscopy)   Other Problems Davy Pique Bynum, [place]; 04/08/2016 1:40 PM) Cerebrovascular Accident Cholelithiasis Gastroesophageal Reflux Disease Thyroid Disease  Past Surgical History Marjean Donna, CMA; 04/08/2016 1:40 PM) Cesarean Section - 1 Colon Polyp Removal - Colonoscopy Colon Polyp Removal - Open Coronary Artery Bypass Graft Gallbladder Surgery - Laparoscopic Spinal Surgery - Lower Back Valve Replacement  Diagnostic Studies History Marjean Donna, CMA; 04/08/2016 1:40 PM) Colonoscopy within last year Mammogram within last year  Allergies Davy Pique Bynum, CMA; 04/08/2016 1:40 PM) No Known Drug Allergies09/27/2017  Medication History (Sonya Bynum, CMA; 04/08/2016 1:41 PM) Furosemide (20MG  Tablet, Oral) Active. Simvastatin (40MG  Tablet, Oral) Active. Levothyroxine Sodium (50MCG Tablet, Oral) Active. Warfarin Sodium (3MG  Tablet, Oral) Active. Medications Reconciled  Social History Marjean Donna, CMA; 04/08/2016 1:40 PM) Tobacco use Former smoker.  Family History Marjean Donna, [place]; 04/08/2016 1:40 PM) Alcohol Abuse Father. Cerebrovascular Accident Brother, Mother. Heart Disease Mother. Heart disease in  female family member before age 1  Pregnancy / Birth History Marjean Donna, [place]; 04/08/2016 1:40 PM) Age at menarche 34 years. Age of menopause <46 Contraceptive History Oral contraceptives. Gravida 1 Maternal age 11-25 Para 1    Review of Systems ([place]; 04/08/2016 1:40 PM) General Present- Weight Gain. Not Present- Appetite Loss, Chills, Fatigue, Fever, Night Sweats and Weight Loss. Skin Not Present- Change in Wart/Mole, Dryness, Hives, Jaundice, New Lesions, Non-Healing Wounds, Rash and Ulcer. HEENT Present- Ringing in the Ears and Wears glasses/contact lenses. Not Present- Earache, Hearing Loss, Hoarseness, Nose Bleed, Oral Ulcers, Seasonal Allergies, Sinus Pain, Sore Throat, Visual Disturbances and Yellow Eyes. Respiratory Not Present- Bloody sputum, Chronic Cough, Difficulty Breathing, Snoring and Wheezing. Breast Not Present- Breast Mass, Breast Pain, Nipple Discharge and Skin Changes. Cardiovascular Not Present- Chest Pain, Difficulty Breathing Lying Down, Leg Cramps, Palpitations, Rapid Heart Rate, Shortness of Breath and Swelling of Extremities. Gastrointestinal Present- Bloating. Not Present- Abdominal Pain, Bloody Stool, Change in Bowel Habits, Chronic diarrhea, Constipation, Difficulty Swallowing, Excessive gas, Gets full quickly at meals, Hemorrhoids, Indigestion, Nausea, Rectal Pain and Vomiting. Female Genitourinary Not Present- Frequency, Nocturia, Painful Urination, Pelvic Pain and Urgency. Musculoskeletal Not Present- Back Pain, Joint Pain, Joint Stiffness, Muscle Pain, Muscle Weakness and Swelling of Extremities. Neurological Not Present- Decreased Memory, Fainting, Headaches, Numbness, Seizures, Tingling, Tremor, Trouble walking and Weakness. Psychiatric Not Present- Anxiety, Bipolar, Change in Sleep Pattern, Depression, Fearful and Frequent crying. Endocrine Present- Hair Changes. Not Present- Cold Intolerance, Excessive Hunger, Heat Intolerance, Hot  flashes and New Diabetes. Hematology Present- Blood Thinners. Not Present- Easy Bruising, Excessive bleeding, Gland problems, HIV and Persistent Infections.  Vitals (Sonya Bynum CMA; 04/08/2016 1:40 PM) 04/08/2016 1:40 PM Weight: 161 lb Height: 62in Body Surface Area: 1.74 m Body Mass Index: 29.45 kg/m  Temp.: 32F(Temporal)  Pulse:  78 (Regular)  BP: 134/80 (Sitting, Left Arm, Standard)  Physical Exam (Cylan Borum B. Hassell Done MD; 04/08/2016 2:39 PM) The physical exam findings are as follows: Note:HEENT unremarkable Neck no bruits Chest clear Heart 3/6 SEM with click Abdomen about 8 cm soft protruding mass in the upper midline Ext FROM  Assessment & Plan Elizabeth Key B. Hassell Done MD; 04/08/2016 2:33 PM) VENTRAL HERNIA (K43.9) Impression: Ventral hernia at single site lap chole incision. Plan lap assisted ventral hernia repair. Will need to come off coumadin and have Lovenox bridge. She bled after her colonoscopy last year and took a week in the hospital with 2 units transfusion.

## 2016-05-15 ENCOUNTER — Inpatient Hospital Stay (HOSPITAL_COMMUNITY)
Admission: AD | Admit: 2016-05-15 | Discharge: 2016-05-17 | DRG: 355 | Disposition: A | Discharge status: Home / Self Care | Payer: Commercial Managed Care - HMO | Source: Ambulatory Visit | Attending: Surgery | Admitting: Surgery

## 2016-05-15 ENCOUNTER — Ambulatory Visit (HOSPITAL_COMMUNITY): Payer: Commercial Managed Care - HMO | Admitting: Registered Nurse

## 2016-05-15 ENCOUNTER — Encounter (HOSPITAL_COMMUNITY): Payer: Self-pay | Admitting: *Deleted

## 2016-05-15 ENCOUNTER — Encounter (HOSPITAL_COMMUNITY)
Admission: AD | Disposition: A | Discharge status: Home / Self Care | Payer: Self-pay | Source: Ambulatory Visit | Attending: Surgery

## 2016-05-15 DIAGNOSIS — K430 Incisional hernia with obstruction, without gangrene: Principal | ICD-10-CM | POA: Diagnosis present

## 2016-05-15 DIAGNOSIS — Z7901 Long term (current) use of anticoagulants: Secondary | ICD-10-CM

## 2016-05-15 DIAGNOSIS — E785 Hyperlipidemia, unspecified: Secondary | ICD-10-CM | POA: Diagnosis present

## 2016-05-15 DIAGNOSIS — Z8719 Personal history of other diseases of the digestive system: Secondary | ICD-10-CM

## 2016-05-15 DIAGNOSIS — K219 Gastro-esophageal reflux disease without esophagitis: Secondary | ICD-10-CM | POA: Diagnosis present

## 2016-05-15 DIAGNOSIS — E039 Hypothyroidism, unspecified: Secondary | ICD-10-CM | POA: Diagnosis present

## 2016-05-15 DIAGNOSIS — Z952 Presence of prosthetic heart valve: Secondary | ICD-10-CM

## 2016-05-15 DIAGNOSIS — K432 Incisional hernia without obstruction or gangrene: Secondary | ICD-10-CM | POA: Diagnosis present

## 2016-05-15 DIAGNOSIS — I2510 Atherosclerotic heart disease of native coronary artery without angina pectoris: Secondary | ICD-10-CM | POA: Diagnosis present

## 2016-05-15 DIAGNOSIS — Z951 Presence of aortocoronary bypass graft: Secondary | ICD-10-CM

## 2016-05-15 DIAGNOSIS — K439 Ventral hernia without obstruction or gangrene: Secondary | ICD-10-CM | POA: Diagnosis present

## 2016-05-15 DIAGNOSIS — Z87891 Personal history of nicotine dependence: Secondary | ICD-10-CM

## 2016-05-15 DIAGNOSIS — I10 Essential (primary) hypertension: Secondary | ICD-10-CM | POA: Diagnosis present

## 2016-05-15 DIAGNOSIS — Z9889 Other specified postprocedural states: Secondary | ICD-10-CM

## 2016-05-15 DIAGNOSIS — Z8673 Personal history of transient ischemic attack (TIA), and cerebral infarction without residual deficits: Secondary | ICD-10-CM

## 2016-05-15 HISTORY — PX: LAPAROSCOPIC ASSISTED VENTRAL HERNIA REPAIR: SHX6312

## 2016-05-15 HISTORY — PX: INSERTION OF MESH: SHX5868

## 2016-05-15 LAB — CBC
HEMATOCRIT: 39.4 % (ref 36.0–46.0)
Hemoglobin: 12.7 g/dL (ref 12.0–15.0)
MCH: 27.4 pg (ref 26.0–34.0)
MCHC: 32.2 g/dL (ref 30.0–36.0)
MCV: 85.1 fL (ref 78.0–100.0)
Platelets: 301 10*3/uL (ref 150–400)
RBC: 4.63 MIL/uL (ref 3.87–5.11)
RDW: 14.2 % (ref 11.5–15.5)
WBC: 8.7 10*3/uL (ref 4.0–10.5)

## 2016-05-15 LAB — CREATININE, SERUM
Creatinine, Ser: 1.18 mg/dL — ABNORMAL HIGH (ref 0.44–1.00)
GFR, EST AFRICAN AMERICAN: 57 mL/min — AB (ref >60)
GFR, EST NON AFRICAN AMERICAN: 49 mL/min — AB (ref >60)

## 2016-05-15 SURGERY — REPAIR, HERNIA, VENTRAL, LAPAROSCOPY-ASSISTED
Anesthesia: General | Site: Abdomen

## 2016-05-15 MED ORDER — GABAPENTIN 300 MG PO CAPS
300.0000 mg | ORAL_CAPSULE | ORAL | Status: AC
  Administered 2016-05-15: 300 mg via ORAL
  Filled 2016-05-15: qty 1

## 2016-05-15 MED ORDER — OXYCODONE HCL 5 MG PO TABS
5.0000 mg | ORAL_TABLET | ORAL | Status: DC | PRN
  Administered 2016-05-15: 5 mg via ORAL
  Administered 2016-05-16: 10 mg via ORAL
  Administered 2016-05-16 – 2016-05-17 (×2): 5 mg via ORAL
  Filled 2016-05-15 (×2): qty 1
  Filled 2016-05-15: qty 2
  Filled 2016-05-15: qty 1

## 2016-05-15 MED ORDER — ENOXAPARIN SODIUM 80 MG/0.8ML ~~LOC~~ SOLN
80.0000 mg | Freq: Two times a day (BID) | SUBCUTANEOUS | Status: DC
  Administered 2016-05-16 – 2016-05-17 (×3): 80 mg via SUBCUTANEOUS
  Filled 2016-05-15 (×3): qty 0.8

## 2016-05-15 MED ORDER — FENTANYL CITRATE (PF) 100 MCG/2ML IJ SOLN
INTRAMUSCULAR | Status: DC | PRN
  Administered 2016-05-15 (×2): 50 ug via INTRAVENOUS
  Administered 2016-05-15: 100 ug via INTRAVENOUS

## 2016-05-15 MED ORDER — ONDANSETRON 4 MG PO TBDP: 4.0000 mg | ORAL_TABLET | Freq: Four times a day (QID) | ORAL | Status: DC | PRN

## 2016-05-15 MED ORDER — BUPIVACAINE HCL (PF) 0.5 % IJ SOLN
INTRAMUSCULAR | Status: AC
  Filled 2016-05-15: qty 30

## 2016-05-15 MED ORDER — ROCURONIUM BROMIDE 50 MG/5ML IV SOSY
PREFILLED_SYRINGE | INTRAVENOUS | Status: AC
  Filled 2016-05-15: qty 5

## 2016-05-15 MED ORDER — DEXAMETHASONE SODIUM PHOSPHATE 10 MG/ML IJ SOLN
INTRAMUSCULAR | Status: AC
  Filled 2016-05-15: qty 1

## 2016-05-15 MED ORDER — HEPARIN SODIUM (PORCINE) 5000 UNIT/ML IJ SOLN
5000.0000 [IU] | Freq: Once | INTRAMUSCULAR | Status: AC
  Administered 2016-05-15: 5000 [IU] via SUBCUTANEOUS
  Filled 2016-05-15: qty 1

## 2016-05-15 MED ORDER — SUGAMMADEX SODIUM 200 MG/2ML IV SOLN
INTRAVENOUS | Status: AC
  Filled 2016-05-15: qty 2

## 2016-05-15 MED ORDER — SODIUM CHLORIDE 0.9 % IJ SOLN
INTRAMUSCULAR | Status: DC | PRN
  Administered 2016-05-15: 20 mL via INTRAVENOUS

## 2016-05-15 MED ORDER — ONDANSETRON HCL 4 MG/2ML IJ SOLN
INTRAMUSCULAR | Status: DC | PRN
  Administered 2016-05-15: 4 mg via INTRAVENOUS

## 2016-05-15 MED ORDER — ONDANSETRON HCL 4 MG/2ML IJ SOLN: 4.0000 mg | Freq: Four times a day (QID) | INTRAMUSCULAR | Status: DC | PRN

## 2016-05-15 MED ORDER — LIDOCAINE 2% (20 MG/ML) 5 ML SYRINGE
INTRAMUSCULAR | Status: DC | PRN
  Administered 2016-05-15: 100 mg via INTRAVENOUS

## 2016-05-15 MED ORDER — SUGAMMADEX SODIUM 200 MG/2ML IV SOLN
INTRAVENOUS | Status: DC | PRN
  Administered 2016-05-15: 150 mg via INTRAVENOUS

## 2016-05-15 MED ORDER — PHENYLEPHRINE 40 MCG/ML (10ML) SYRINGE FOR IV PUSH (FOR BLOOD PRESSURE SUPPORT)
PREFILLED_SYRINGE | INTRAVENOUS | Status: AC
  Filled 2016-05-15: qty 10

## 2016-05-15 MED ORDER — CELECOXIB 200 MG PO CAPS
400.0000 mg | ORAL_CAPSULE | ORAL | Status: AC
  Administered 2016-05-15: 400 mg via ORAL
  Filled 2016-05-15: qty 2

## 2016-05-15 MED ORDER — LEVOTHYROXINE SODIUM 50 MCG PO TABS
50.0000 ug | ORAL_TABLET | Freq: Every day | ORAL | Status: DC
  Administered 2016-05-16 – 2016-05-17 (×2): 50 ug via ORAL
  Filled 2016-05-15 (×2): qty 1

## 2016-05-15 MED ORDER — ONDANSETRON HCL 4 MG/2ML IJ SOLN
INTRAMUSCULAR | Status: AC
  Filled 2016-05-15: qty 2

## 2016-05-15 MED ORDER — PROPOFOL 10 MG/ML IV BOLUS
INTRAVENOUS | Status: DC | PRN
  Administered 2016-05-15: 120 mg via INTRAVENOUS

## 2016-05-15 MED ORDER — HYDROMORPHONE HCL 1 MG/ML IJ SOLN
INTRAMUSCULAR | Status: AC
  Filled 2016-05-15: qty 1

## 2016-05-15 MED ORDER — BUPIVACAINE LIPOSOME 1.3 % IJ SUSP
INTRAMUSCULAR | Status: DC | PRN
  Administered 2016-05-15: 20 mL

## 2016-05-15 MED ORDER — DEXAMETHASONE SODIUM PHOSPHATE 10 MG/ML IJ SOLN
INTRAMUSCULAR | Status: DC | PRN
  Administered 2016-05-15: 10 mg via INTRAVENOUS

## 2016-05-15 MED ORDER — ENOXAPARIN SODIUM 40 MG/0.4ML ~~LOC~~ SOLN
40.0000 mg | Freq: Once | SUBCUTANEOUS | Status: AC
  Administered 2016-05-15: 40 mg via SUBCUTANEOUS
  Filled 2016-05-15: qty 0.4

## 2016-05-15 MED ORDER — ONDANSETRON HCL 4 MG/2ML IJ SOLN: 4.0000 mg | Freq: Once | INTRAMUSCULAR | Status: DC | PRN

## 2016-05-15 MED ORDER — SODIUM CHLORIDE 0.9 % IJ SOLN
INTRAMUSCULAR | Status: AC
  Filled 2016-05-15: qty 10

## 2016-05-15 MED ORDER — ROCURONIUM BROMIDE 50 MG/5ML IV SOSY
PREFILLED_SYRINGE | INTRAVENOUS | Status: DC | PRN
  Administered 2016-05-15: 10 mg via INTRAVENOUS
  Administered 2016-05-15: 50 mg via INTRAVENOUS

## 2016-05-15 MED ORDER — MIDAZOLAM HCL 5 MG/5ML IJ SOLN
INTRAMUSCULAR | Status: DC | PRN
  Administered 2016-05-15: 2 mg via INTRAVENOUS

## 2016-05-15 MED ORDER — PROPOFOL 10 MG/ML IV BOLUS
INTRAVENOUS | Status: AC
  Filled 2016-05-15: qty 20

## 2016-05-15 MED ORDER — FAMOTIDINE IN NACL 20-0.9 MG/50ML-% IV SOLN
20.0000 mg | Freq: Two times a day (BID) | INTRAVENOUS | Status: DC
  Administered 2016-05-15 – 2016-05-16 (×2): 20 mg via INTRAVENOUS
  Filled 2016-05-15 (×2): qty 50

## 2016-05-15 MED ORDER — HYDROMORPHONE HCL 1 MG/ML IJ SOLN
0.2500 mg | INTRAMUSCULAR | Status: DC | PRN
  Administered 2016-05-15 (×2): 0.5 mg via INTRAVENOUS

## 2016-05-15 MED ORDER — FENTANYL CITRATE (PF) 100 MCG/2ML IJ SOLN
INTRAMUSCULAR | Status: AC
  Filled 2016-05-15: qty 2

## 2016-05-15 MED ORDER — CEFAZOLIN SODIUM-DEXTROSE 2-4 GM/100ML-% IV SOLN
2.0000 g | INTRAVENOUS | Status: AC
  Administered 2016-05-15: 2 g via INTRAVENOUS

## 2016-05-15 MED ORDER — MEPERIDINE HCL 50 MG/ML IJ SOLN: 6.2500 mg | INTRAMUSCULAR | Status: DC | PRN

## 2016-05-15 MED ORDER — CEFAZOLIN SODIUM-DEXTROSE 2-4 GM/100ML-% IV SOLN
INTRAVENOUS | Status: AC
  Filled 2016-05-15: qty 100

## 2016-05-15 MED ORDER — 0.9 % SODIUM CHLORIDE (POUR BTL) OPTIME
TOPICAL | Status: DC | PRN
  Administered 2016-05-15: 1000 mL

## 2016-05-15 MED ORDER — PHENYLEPHRINE 40 MCG/ML (10ML) SYRINGE FOR IV PUSH (FOR BLOOD PRESSURE SUPPORT)
PREFILLED_SYRINGE | INTRAVENOUS | Status: DC | PRN
  Administered 2016-05-15: 80 ug via INTRAVENOUS
  Administered 2016-05-15: 120 ug via INTRAVENOUS

## 2016-05-15 MED ORDER — ACETAMINOPHEN 500 MG PO TABS
1000.0000 mg | ORAL_TABLET | ORAL | Status: AC
  Administered 2016-05-15: 1000 mg via ORAL
  Filled 2016-05-15: qty 2

## 2016-05-15 MED ORDER — HYDROMORPHONE HCL 1 MG/ML IJ SOLN: 1.0000 mg | INTRAMUSCULAR | Status: DC | PRN

## 2016-05-15 MED ORDER — MIDAZOLAM HCL 2 MG/2ML IJ SOLN
INTRAMUSCULAR | Status: AC
  Filled 2016-05-15: qty 2

## 2016-05-15 MED ORDER — BUPIVACAINE LIPOSOME 1.3 % IJ SUSP
20.0000 mL | Freq: Once | INTRAMUSCULAR | Status: DC
  Filled 2016-05-15: qty 20

## 2016-05-15 MED ORDER — CEFAZOLIN SODIUM-DEXTROSE 2-4 GM/100ML-% IV SOLN
2.0000 g | Freq: Three times a day (TID) | INTRAVENOUS | Status: AC
  Administered 2016-05-15: 2 g via INTRAVENOUS
  Filled 2016-05-15: qty 100

## 2016-05-15 MED ORDER — LACTATED RINGERS IV SOLN
INTRAVENOUS | Status: DC
  Administered 2016-05-15 (×2): via INTRAVENOUS

## 2016-05-15 SURGICAL SUPPLY — 36 items
BINDER ABDOMINAL 12 ML 46-62 (SOFTGOODS) ×4 IMPLANT
CABLE HIGH FREQUENCY MONO STRZ (ELECTRODE) ×4 IMPLANT
CHLORAPREP W/TINT 26ML (MISCELLANEOUS) ×4 IMPLANT
COVER SURGICAL LIGHT HANDLE (MISCELLANEOUS) IMPLANT
DECANTER SPIKE VIAL GLASS SM (MISCELLANEOUS) ×4 IMPLANT
DERMABOND ADVANCED (GAUZE/BANDAGES/DRESSINGS) ×2
DERMABOND ADVANCED .7 DNX12 (GAUZE/BANDAGES/DRESSINGS) ×2 IMPLANT
DEVICE SECURE STRAP 25 ABSORB (INSTRUMENTS) ×4 IMPLANT
DEVICE TROCAR PUNCTURE CLOSURE (ENDOMECHANICALS) ×4 IMPLANT
DISSECTOR BLUNT TIP ENDO 5MM (MISCELLANEOUS) IMPLANT
ELECT PENCIL ROCKER SW 15FT (MISCELLANEOUS) ×4 IMPLANT
ELECT REM PT RETURN 9FT ADLT (ELECTROSURGICAL) ×4
ELECTRODE REM PT RTRN 9FT ADLT (ELECTROSURGICAL) ×2 IMPLANT
GLOVE BIOGEL M 8.0 STRL (GLOVE) ×4 IMPLANT
GOWN STRL REUS W/TWL XL LVL3 (GOWN DISPOSABLE) ×20 IMPLANT
IRRIG SUCT STRYKERFLOW 2 WTIP (MISCELLANEOUS) ×4
IRRIGATION SUCT STRKRFLW 2 WTP (MISCELLANEOUS) ×2 IMPLANT
KIT BASIN OR (CUSTOM PROCEDURE TRAY) ×4 IMPLANT
MARKER SKIN DUAL TIP RULER LAB (MISCELLANEOUS) ×4 IMPLANT
MESH VENTRALEX ST 8CM LRG (Mesh General) ×4 IMPLANT
NEEDLE SPNL 22GX3.5 QUINCKE BK (NEEDLE) ×4 IMPLANT
SCRUB TECHNI CARE 4 OZ NO DYE (MISCELLANEOUS) ×4 IMPLANT
SHEARS HARMONIC ACE PLUS 45CM (MISCELLANEOUS) ×4 IMPLANT
SLEEVE XCEL OPT CAN 5 100 (ENDOMECHANICALS) ×8 IMPLANT
STAPLER VISISTAT 35W (STAPLE) IMPLANT
SUT MNCRL AB 4-0 PS2 18 (SUTURE) ×12 IMPLANT
SUT NOVA NAB DX-16 0-1 5-0 T12 (SUTURE) ×8 IMPLANT
SUT VIC AB 4-0 SH 18 (SUTURE) IMPLANT
TACKER 5MM HERNIA 3.5CML NAB (ENDOMECHANICALS) IMPLANT
TOWEL OR 17X26 10 PK STRL BLUE (TOWEL DISPOSABLE) ×4 IMPLANT
TOWEL OR NON WOVEN STRL DISP B (DISPOSABLE) ×4 IMPLANT
TRAY FOLEY W/METER SILVER 16FR (SET/KITS/TRAYS/PACK) IMPLANT
TRAY LAPAROSCOPIC (CUSTOM PROCEDURE TRAY) ×4 IMPLANT
TROCAR BLADELESS OPT 5 100 (ENDOMECHANICALS) ×4 IMPLANT
TROCAR XCEL NON-BLD 11X100MML (ENDOMECHANICALS) ×4 IMPLANT
TUBING INSUF HEATED (TUBING) ×4 IMPLANT

## 2016-05-15 NOTE — Anesthesia Procedure Notes (Signed)
Procedure Name: Intubation Date/Time: 05/15/2016 2:19 PM Performed by: Talbot Grumbling Pre-anesthesia Checklist: Patient identified, Emergency Drugs available, Suction available and Patient being monitored Patient Re-evaluated:Patient Re-evaluated prior to inductionOxygen Delivery Method: Circle system utilized Preoxygenation: Pre-oxygenation with 100% oxygen Intubation Type: IV induction Ventilation: Mask ventilation without difficulty Laryngoscope Size: Mac and 3 Grade View: Grade I Tube type: Oral Tube size: 7.0 mm Number of attempts: 1 Airway Equipment and Method: Stylet Placement Confirmation: ETT inserted through vocal cords under direct vision,  positive ETCO2 and breath sounds checked- equal and bilateral Secured at: 21 cm Tube secured with: Tape Dental Injury: Teeth and Oropharynx as per pre-operative assessment

## 2016-05-15 NOTE — Transfer of Care (Signed)
Immediate Anesthesia Transfer of Care Note  Patient: Elizabeth Morton  Procedure(s) Performed: Procedure(s): LAPAROSCOPIC ASSISTED VENTRAL HERNIA REPAIR WITH MESH (N/A) INSERTION OF MESH  Patient Location: PACU  Anesthesia Type:General  Level of Consciousness:  sedated, patient cooperative and responds to stimulation  Airway & Oxygen Therapy:Patient Spontanous Breathing and Patient connected to face mask oxgen  Post-op Assessment:  Report given to PACU RN and Post -op Vital signs reviewed and stable  Post vital signs:  Reviewed and stable  Last Vitals: There were no vitals filed for this visit.  Complications: No apparent anesthesia complications

## 2016-05-15 NOTE — Op Note (Signed)
Surgeon: Kaylyn Lim, MD, FACS  Asst:  none  Anes:  general  Procedure: Laparoscopic assisted incarcerated ventral incisional hernia repair with Ventralex mesh patch 8cm  Diagnosis: Supraumbilical ventral incisional  hernia  Complications: none  EBL:   5 cc  Drains: none  Description of Procedure:  The patient was taken to OR 1 at [facility].  After anesthesia was administered and the patient was prepped a timeout was performed.  Access to the abdomen was achieved with 5 mm Optiview through a left upper quadrant approach.  Transilluminating the abdominal wall I could see the hematomas that she had developed secondary to the Lovenox injections. A second site was placed in the left lower quadrant.  A wad of omentum was incarcerated within the defect and the Harmonic scalpel was used to remove it from the sac.    The defect was about an inch in diameter.  Overlying it I made a transverse incision and excised the sac with the Harmonic scalpel.  8 cm Ventralex mesh patch was introduced into the abdomen, deployed, and secured with 8 horizontal mattress sutures placed such that the knots were tied down behind the fascia with 1 Novafil.  Reinflation demonstrated good deployment and the SecureStrap was used to tack the patch at the perimeter.  No bleeding was noted and it looked good.  Exparel was used to inject the incisions and port sites.  Two #1 Novafils were placed and tied to bring the fascia over the mesh when the pneumo had been relieved.  The wounds were inspected and no bleeding was noted.  These were closed in layers with 4-0 monocryl and Dermabond.  The patient tolerated the procedure well and was taken to the PACU in stable condition.     Matt B. Hassell Done, [place], [facility], [place]

## 2016-05-15 NOTE — Anesthesia Preprocedure Evaluation (Signed)
Anesthesia Evaluation  Patient identified by MRN, date of birth, ID band Patient awake    Reviewed: Allergy & Precautions, NPO status , Patient's Chart, lab work & pertinent test results  Airway Mallampati: I  TM Distance: >3 FB Neck ROM: Full    Dental   Pulmonary former smoker,    Pulmonary exam normal        Cardiovascular hypertension, + CAD and + CABG  Normal cardiovascular exam+ Valvular Problems/Murmurs AS   H/O Aortic valve replacement currently asymptomatic   Neuro/Psych CVA    GI/Hepatic GERD  Medicated and Controlled,  Endo/Other    Renal/GU      Musculoskeletal   Abdominal   Peds  Hematology   Anesthesia Other Findings   Reproductive/Obstetrics                             Anesthesia Physical Anesthesia Plan  ASA: III  Anesthesia Plan: General   Post-op Pain Management:    Induction: Intravenous  Airway Management Planned: Oral ETT  Additional Equipment:   Intra-op Plan:   Post-operative Plan: Extubation in OR  Informed Consent: I have reviewed the patients History and Physical, chart, labs and discussed the procedure including the risks, benefits and alternatives for the proposed anesthesia with the patient or authorized representative who has indicated his/her understanding and acceptance.     Plan Discussed with: CRNA and Surgeon  Anesthesia Plan Comments:         Anesthesia Quick Evaluation

## 2016-05-15 NOTE — Interval H&P Note (Signed)
History and Physical Interval Note:  05/15/2016 2:05 PM  Elizabeth Morton  has presented today for surgery, with the diagnosis of VENTRAL HERNIA  The various methods of treatment have been discussed with the patient and family. After consideration of risks, benefits and other options for treatment, the patient has consented to  Procedure(s): [place] (N/A) as a surgical intervention .  The patient's history has been reviewed, patient examined, no change in status, stable for surgery.  I have reviewed the patient's chart and labs.  Questions were answered to the patient's satisfaction.     Imoni Kohen B

## 2016-05-15 NOTE — Progress Notes (Signed)
ANTICOAGULATION CONSULT NOTE - Initial Consult  Pharmacy Consult for Lovenox Indication: bridging to Warfarin, hx of AVR  No Known Allergies  Patient Measurements: Height: 5\' 2"  (157.5 cm) Weight: 164 lb (74.4 kg) IBW/kg (Calculated) : 50.1   Vital Signs: Temp: 98.3 F (36.8 C) (11/03 1735) BP: 121/72 (11/03 1735) Pulse Rate: 82 (11/03 1735)  Labs:  Recent Labs  05/13/16 1321  HGB 14.1  HCT 42.9  PLT 351  LABPROT 16.4*  INR 1.31  CREATININE 1.15*   Estimated Creatinine Clearance: 49.7 mL/min (by C-G formula based on SCr of 1.15 mg/dL (H)).  Medical History: Past Medical History:  Diagnosis Date  . Aortic stenosis    Aortic valve replacement 2006  . CAD (coronary artery disease)    a. s/p CABG 2006 at time of AVR.  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 Dyslipidemia   . Gallstones   . GERD (gastroesophageal reflux disease)   . Heart murmur   . Hx of CABG    2006,LIMA to LAD, SVG to diagonal  . Hx of transfusion of packed red blood cells   . Hypertension   . Hypothyroidism   . Iron deficiency anemia   . Lower GI bleed 06/2015  . Pericardial effusion    Insignificant small pericardial effusion seen on echo, November, 2013  . Peripheral vascular disease ([place]) 06   blood clot -stroke  . S/P aortic valve replacement    a. Bentall procedure,#21 St. Jude mechanical valve conduit with reimplantation of the coronaries 2006  . Status post colonoscopy with polypectomy    "bleeding; colonoscopy was ~ 06/18/2015"  . Stroke [facility]) 2006   no deficits   . Warfarin anticoagulation    coumadin therapy   Medications:  Scheduled:  .  ceFAZolin (ANCEF) IV  2 g Intravenous Q8H  . enoxaparin (LOVENOX) injection  40 mg Subcutaneous Once  . [START ON 05/16/2016] enoxaparin  80 mg Subcutaneous Q12H  . famotidine (PEPCID) IV  20 mg Intravenous Q12H  . HYDROmorphone      . [START ON 05/16/2016] levothyroxine  50 mcg Oral QAC breakfast    Assessment: 41 yoF s/p ventral hernia repair with mesh (lap assisted). Chronic Warfarin for AVR, bridged with Lovenox to surgery, 80mg  SQ bid, with last dose 11/2 am. Noted bleed after colonoscopy last year requiring transfusion.   Goal of Therapy:  Anti-Xa level 0.6-1 units/ml 4hrs after LMWH dose given Monitor platelets by anticoagulation protocol: Yes   Plan:   Discussed with surgeon, concerned for anti-coagulation with last dose Lovenox 11/2 am  Lovenox 40 mg x1  Begin Lovenox 80mg  q12 POD1  Daily CBC  Plan resume Warfarin 11/4  Minda Ditto PharmD Pager [phone] 05/15/2016, 8:23 PM

## 2016-05-15 NOTE — Anesthesia Postprocedure Evaluation (Signed)
Anesthesia Post Note  Patient: Elizabeth Morton  Procedure(s) Performed: Procedure(s) (LRB): LAPAROSCOPIC ASSISTED VENTRAL HERNIA REPAIR WITH MESH (N/A) INSERTION OF MESH  Patient location during evaluation: PACU Anesthesia Type: General Level of consciousness: awake and alert Pain management: pain level controlled Vital Signs Assessment: post-procedure vital signs reviewed and stable Respiratory status: spontaneous breathing, nonlabored ventilation, respiratory function stable and patient connected to nasal cannula oxygen Cardiovascular status: blood pressure returned to baseline and stable Postop Assessment: no signs of nausea or vomiting Anesthetic complications: no    Last Vitals:  Vitals:   05/15/16 1735 05/15/16 1740  BP: 121/72 121/72  Pulse: 82 82  Resp: 16   Temp: 36.8 C 36.8 C    Last Pain:  Vitals:   05/15/16 1822  TempSrc:   PainSc: 0-No pain                 Acel Natzke DAVID

## 2016-05-16 DIAGNOSIS — I2510 Atherosclerotic heart disease of native coronary artery without angina pectoris: Secondary | ICD-10-CM | POA: Diagnosis present

## 2016-05-16 DIAGNOSIS — E039 Hypothyroidism, unspecified: Secondary | ICD-10-CM | POA: Diagnosis present

## 2016-05-16 DIAGNOSIS — Z8673 Personal history of transient ischemic attack (TIA), and cerebral infarction without residual deficits: Secondary | ICD-10-CM | POA: Diagnosis not present

## 2016-05-16 DIAGNOSIS — K430 Incisional hernia with obstruction, without gangrene: Secondary | ICD-10-CM | POA: Diagnosis present

## 2016-05-16 DIAGNOSIS — Z7901 Long term (current) use of anticoagulants: Secondary | ICD-10-CM | POA: Diagnosis not present

## 2016-05-16 DIAGNOSIS — Z87891 Personal history of nicotine dependence: Secondary | ICD-10-CM | POA: Diagnosis not present

## 2016-05-16 DIAGNOSIS — I10 Essential (primary) hypertension: Secondary | ICD-10-CM | POA: Diagnosis present

## 2016-05-16 DIAGNOSIS — Z951 Presence of aortocoronary bypass graft: Secondary | ICD-10-CM | POA: Diagnosis not present

## 2016-05-16 DIAGNOSIS — K219 Gastro-esophageal reflux disease without esophagitis: Secondary | ICD-10-CM | POA: Diagnosis present

## 2016-05-16 DIAGNOSIS — Z952 Presence of prosthetic heart valve: Secondary | ICD-10-CM | POA: Diagnosis not present

## 2016-05-16 DIAGNOSIS — E785 Hyperlipidemia, unspecified: Secondary | ICD-10-CM | POA: Diagnosis present

## 2016-05-16 LAB — COMPREHENSIVE METABOLIC PANEL
ALT: 29 U/L (ref 14–54)
AST: 45 U/L — AB (ref 15–41)
Albumin: 3.7 g/dL (ref 3.5–5.0)
Alkaline Phosphatase: 92 U/L (ref 38–126)
Anion gap: 6 (ref 5–15)
BILIRUBIN TOTAL: 0.6 mg/dL (ref 0.3–1.2)
BUN: 11 mg/dL (ref 6–20)
CHLORIDE: 106 mmol/L (ref 101–111)
CO2: 26 mmol/L (ref 22–32)
CREATININE: 1.02 mg/dL — AB (ref 0.44–1.00)
Calcium: 9.1 mg/dL (ref 8.9–10.3)
GFR, EST NON AFRICAN AMERICAN: 59 mL/min — AB (ref >60)
Glucose, Bld: 139 mg/dL — ABNORMAL HIGH (ref 65–99)
POTASSIUM: 4.3 mmol/L (ref 3.5–5.1)
Sodium: 138 mmol/L (ref 135–145)
TOTAL PROTEIN: 7 g/dL (ref 6.5–8.1)

## 2016-05-16 LAB — CBC
HEMATOCRIT: 39.2 % (ref 36.0–46.0)
Hemoglobin: 12.7 g/dL (ref 12.0–15.0)
MCH: 27.9 pg (ref 26.0–34.0)
MCHC: 32.4 g/dL (ref 30.0–36.0)
MCV: 86 fL (ref 78.0–100.0)
PLATELETS: 301 10*3/uL (ref 150–400)
RBC: 4.56 MIL/uL (ref 3.87–5.11)
RDW: 14.5 % (ref 11.5–15.5)
WBC: 8.4 10*3/uL (ref 4.0–10.5)

## 2016-05-16 MED ORDER — FAMOTIDINE 20 MG PO TABS
20.0000 mg | ORAL_TABLET | Freq: Two times a day (BID) | ORAL | Status: DC
  Administered 2016-05-16 – 2016-05-17 (×2): 20 mg via ORAL
  Filled 2016-05-16 (×2): qty 1

## 2016-05-16 NOTE — Progress Notes (Signed)
Patient ID: Elizabeth Morton, female   DOB: [birth year], 60 y.o.   MRN: [id number] [facility] Progress Note:   1 Day Post-Op  Subjective: Mental status is clear.  Minimal pain Objective: Vital signs in last 24 hours: Temp:  [97.5 F (36.4 C)-98.3 F (36.8 C)] 97.7 F (36.5 C) (11/04 0550) Pulse Rate:  [67-82] 78 (11/04 0550) Resp:  [10-18] 18 (11/04 0550) BP: (118-148)/(61-100) 120/65 (11/04 0550) SpO2:  [94 %-99 %] 94 % (11/04 0550) FiO2 (%):  [2 %] 2 % (11/03 1740) Weight:  [74.4 kg (164 lb)] 74.4 kg (164 lb) (11/03 1740)  Intake/Output from previous day: 11/03 0701 - 11/04 0700 In: 1830 [P.O.:480; I.V.:1200; IV Piggyback:150] Out: 910 [Urine:900; Blood:10] Intake/Output this shift: No intake/output data recorded.  Physical Exam: Work of breathing is normal.  Using abdominal binder  Lab Results:  Results for orders placed or performed during the hospital encounter of 05/15/16 (from the past 48 hour(s))  CBC     Status: None   Collection Time: 05/15/16  5:51 PM  Result Value Ref Range   WBC 8.7 4.0 - 10.5 K/uL   RBC 4.63 3.87 - 5.11 MIL/uL   Hemoglobin 12.7 12.0 - 15.0 g/dL   HCT 39.4 36.0 - 46.0 %   MCV 85.1 78.0 - 100.0 fL   MCH 27.4 26.0 - 34.0 pg   MCHC 32.2 30.0 - 36.0 g/dL   RDW 14.2 11.5 - 15.5 %   Platelets 301 150 - 400 K/uL  Creatinine, serum     Status: Abnormal   Collection Time: 05/15/16  5:51 PM  Result Value Ref Range   Creatinine, Ser 1.18 (H) 0.44 - 1.00 mg/dL   GFR calc non Af Amer 49 (L) >60 mL/min   GFR calc Af Amer 57 (L) >60 mL/min    Comment: (NOTE) The eGFR has been calculated using the CKD EPI equation. This calculation has not been validated in all clinical situations. eGFR's persistently <60 mL/min signify possible Chronic Kidney Disease.   CBC     Status: None   Collection Time: 05/16/16  3:38 AM  Result Value Ref Range   WBC 8.4 4.0 - 10.5 K/uL   RBC 4.56 3.87 - 5.11 MIL/uL   Hemoglobin 12.7 12.0 - 15.0 g/dL   HCT 39.2  36.0 - 46.0 %   MCV 86.0 78.0 - 100.0 fL   MCH 27.9 26.0 - 34.0 pg   MCHC 32.4 30.0 - 36.0 g/dL   RDW 14.5 11.5 - 15.5 %   Platelets 301 150 - 400 K/uL  Comprehensive metabolic panel     Status: Abnormal   Collection Time: 05/16/16  3:38 AM  Result Value Ref Range   Sodium 138 135 - 145 mmol/L   Potassium 4.3 3.5 - 5.1 mmol/L   Chloride 106 101 - 111 mmol/L   CO2 26 22 - 32 mmol/L   Glucose, Bld 139 (H) 65 - 99 mg/dL   BUN 11 6 - 20 mg/dL   Creatinine, Ser 1.02 (H) 0.44 - 1.00 mg/dL   Calcium 9.1 8.9 - 10.3 mg/dL   Total Protein 7.0 6.5 - 8.1 g/dL   Albumin 3.7 3.5 - 5.0 g/dL   AST 45 (H) 15 - 41 U/L   ALT 29 14 - 54 U/L   Alkaline Phosphatase 92 38 - 126 U/L   Total Bilirubin 0.6 0.3 - 1.2 mg/dL   GFR calc non Af Amer 59 (L) >60 mL/min   GFR calc Af Amer >  60 >60 mL/min    Comment: (NOTE) The eGFR has been calculated using the CKD EPI equation. This calculation has not been validated in all clinical situations. eGFR's persistently <60 mL/min signify possible Chronic Kidney Disease.    Anion gap 6 5 - 15    Radiology/Results: No results found.  Anti-infectives: Anti-infectives    Start     Dose/Rate Route Frequency Ordered Stop   05/15/16 2200  ceFAZolin (ANCEF) IVPB 2g/100 mL premix     2 g 200 mL/hr over 30 Minutes Intravenous Every 8 hours 05/15/16 1746 05/15/16 2238   05/15/16 1031  ceFAZolin (ANCEF) IVPB 2g/100 mL premix     2 g 200 mL/hr over 30 Minutes Intravenous On call to O.R. 05/15/16 1031 05/15/16 1435      Assessment/Plan: Problem List: Patient Active Problem List   Diagnosis Date Noted  . S/P repair of ventral hernia Nov 2017 05/15/2016  . Ventral hernia 05/15/2016  . Lower GI bleed   . Hypokalemia 06/26/2015  . Acute GI bleeding 06/25/2015  . Chronic congestive heart failure with left ventricular diastolic dysfunction ([place]) 06/25/2015  . Hypothyroidism 06/25/2015  . Acute blood loss anemia 06/25/2015  . Long term (current) use of  anticoagulants 06/02/2012  . Preop cardiovascular exam   . Pericardial effusion   . CAD (coronary artery disease)   . S/P aortic valve replacement   . Aortic stenosis   . Ejection fraction   . Ecchymosis   . Chronic cholecystitis with calculus 05/25/2012  . Constipation, chronic 05/25/2012  . Needs screening Colonoscopy - Pt refused 05/25/2012  . Warfarin anticoagulation   . Hypertension   . Hx of CABG   . CVA (cerebral infarction)   . Dyslipidemia     Doing well.  Begun Lovenox and will likely start coumadin tomorrow.  Will admit today since she did have a large bleed with her colonoscopy before.   1 Day Post-Op    LOS: 0 days   Matt B. Hassell Done, MD, [facility] [phone] beeper [phone]  05/16/2016 11:07 AM

## 2016-05-17 LAB — BASIC METABOLIC PANEL
ANION GAP: 8 (ref 5–15)
BUN: 16 mg/dL (ref 6–20)
CALCIUM: 8.9 mg/dL (ref 8.9–10.3)
CO2: 26 mmol/L (ref 22–32)
Chloride: 107 mmol/L (ref 101–111)
Creatinine, Ser: 1.22 mg/dL — ABNORMAL HIGH (ref 0.44–1.00)
GFR calc non Af Amer: 48 mL/min — ABNORMAL LOW (ref >60)
GFR, EST AFRICAN AMERICAN: 55 mL/min — AB (ref >60)
GLUCOSE: 103 mg/dL — AB (ref 65–99)
POTASSIUM: 4.2 mmol/L (ref 3.5–5.1)
Sodium: 141 mmol/L (ref 135–145)

## 2016-05-17 LAB — CBC
HEMATOCRIT: 37.6 % (ref 36.0–46.0)
HEMOGLOBIN: 12 g/dL (ref 12.0–15.0)
MCH: 27.6 pg (ref 26.0–34.0)
MCHC: 31.9 g/dL (ref 30.0–36.0)
MCV: 86.4 fL (ref 78.0–100.0)
Platelets: 291 10*3/uL (ref 150–400)
RBC: 4.35 MIL/uL (ref 3.87–5.11)
RDW: 14.8 % (ref 11.5–15.5)
WBC: 10.1 10*3/uL (ref 4.0–10.5)

## 2016-05-17 MED ORDER — OXYCODONE HCL 5 MG PO TABS: 5.0000 mg | ORAL_TABLET | ORAL | 0 refills | Status: DC | PRN

## 2016-05-17 NOTE — Discharge Summary (Signed)
Physician Discharge Summary  Patient ID: Elizabeth Morton MRN: [id number] DOB/AGE: 03/18/1956 60 y.o.  Admit date: 05/15/2016 Discharge date: 05/17/2016  Admission Diagnoses:  Ventral incisional hernia  Discharge Diagnoses:  same  Principal Problem:   S/P repair of ventral hernia Nov 2017 Active Problems:   Ventral hernia   Surgery:  Lap assisted repair of incarcerated incisional hernia  Discharged Condition: improved  Hospital Course:   Had surgery.  Has mechanical heart valve and has required Lovenox bridging with coumadin.  To restart coumadin tonight after discharge and followup with coumadin clinic  Consults: pharmacy for management of Lovenox  Significant Diagnostic Studies: none    Discharge Exam: Blood pressure (!) 124/50, pulse 79, temperature 98.4 F (36.9 C), temperature source Oral, resp. rate 18, height 5\' 2"  (1.575 m), weight 74.4 kg (164 lb), SpO2 95 %. Incision OK  Disposition: 01-Home or Self Care  Discharge Instructions    Diet - low sodium heart healthy    Complete by:  As directed    Discharge instructions    Complete by:  As directed    Take coumadin dosage tonight and follow schedule with Lovenox and coumadin tomorrow and follow up with the coumadin clinic   Discharge wound care:    Complete by:  As directed    May shower and shampoo Wear abdominal binder as it makes you feel better.   Increase activity slowly    Complete by:  As directed        Medication List    TAKE these medications   acetaminophen 500 MG tablet Commonly known as:  TYLENOL Take 500-1,000 mg by mouth every 6 (six) hours as needed (for pain).   CALCIUM 600+D 600-400 MG-UNIT tablet Generic drug:  Calcium Carbonate-Vitamin D Take 1 tablet by mouth 2 (two) times daily.   enoxaparin 80 MG/0.8ML injection Commonly known as:  LOVENOX Inject 0.8 mLs (80 mg total) into the skin every 12 (twelve) hours.   furosemide 20 MG tablet Commonly known as:  LASIX Take 20 mg by  mouth daily.   levothyroxine 50 MCG tablet Commonly known as:  SYNTHROID, LEVOTHROID Take 50 mcg by mouth daily before breakfast.   omeprazole 20 MG capsule Commonly known as:  PRILOSEC Take 20 mg by mouth daily.   oxyCODONE 5 MG immediate release tablet Commonly known as:  Oxy IR/ROXICODONE Take 1 tablet (5 mg total) by mouth every 4 (four) hours as needed for moderate pain.   PHAZYME MAXIMUM STRENGTH 250 MG Caps Generic drug:  Simethicone Take 250 mg by mouth 2 (two) times daily as needed (for gas/flatulence).   potassium chloride 10 MEQ CR capsule Commonly known as:  MICRO-K TAKE 1 TABLET BY MOUTH EVERY DAY   simvastatin 40 MG tablet Commonly known as:  ZOCOR Take 1 tablet (40 mg total) by mouth at bedtime.   triazolam 0.25 MG tablet Commonly known as:  HALCION Take 0.25 mg by mouth at bedtime.   warfarin 3 MG tablet Commonly known as:  COUMADIN TAKE 1 TABLET (3 MG TOTAL) BY MOUTH AS DIRECTED.      Follow-up Information    Bryli Mantey B, MD Follow up in 3 week(s).   Specialty:  General Surgery Contact information: [address] [place] [address] [phone]           Signed: Pedro Earls 05/17/2016, 11:30 AM

## 2016-05-17 NOTE — Discharge Instructions (Signed)
Open Hernia Repair, Care After °Refer to this sheet in the next few weeks. These instructions provide you with information on caring for yourself after your procedure. Your health care provider may also give you more specific instructions. Your treatment has been planned according to current medical practices, but problems sometimes occur. Call your health care provider if you have any problems or questions after your procedure. °WHAT TO EXPECT AFTER THE PROCEDURE °After your procedure, it is typical to have the following: °· Pain in your abdomen, especially along your incision. You will be given pain medicines to control the pain. °· Constipation. You may be given a stool softener to help prevent this. °HOME CARE INSTRUCTIONS °· Only take over-the-counter or prescription medicines as directed by your health care provider. °· Keep the incision area dry and clean. You may wash the incision area gently with soap and water 48 hours after surgery. Gently blot or dab the incision area dry. Do not take baths, use swimming pools, or use hot tubs for 10 days or until your health care provider approves. °· Change bandages (dressings) as directed by your health care provider. °· Continue your normal diet as directed by your health care provider. Eat plenty of fruits and vegetables to help prevent constipation. °· Drink enough fluids to keep your urine clear or pale yellow. This also helps prevent constipation. °· Do not drive until your health care provider says it is okay. °· Do not lift anything heavier than 10 lb (4.5 kg) or play contact sports for 4 weeks or until your health care provider approves. °· Follow up with your health care provider as directed. Ask your health care provider when to make an appointment to have your stitches (sutures) or staples removed. °SEEK MEDICAL CARE IF: °· You have increased bleeding coming from the incision site. °· You have blood in your stool. °· You have increasing pain in the incision  area. °· You see redness or swelling in the incision area. °· You have fluid (pus) coming from the incision. °· You have a fever. °· You notice a bad smell coming from the incision area or dressing. °SEEK IMMEDIATE MEDICAL CARE IF: °· You develop a rash. °· You have chest pain or shortness of breath. °· You feel lightheaded or feel faint. °  °This information is not intended to replace advice given to you by your health care provider. Make sure you discuss any questions you have with your health care provider. °  °Document Released: 01/16/2005 Document Revised: 07/20/2014 Document Reviewed: 02/08/2013 °Elsevier Interactive Patient Education ©2016 [employer] ° °

## 2016-05-18 ENCOUNTER — Encounter (HOSPITAL_COMMUNITY): Payer: Self-pay | Admitting: Surgery

## 2016-05-20 ENCOUNTER — Ambulatory Visit: Payer: Commercial Managed Care - HMO | Admitting: Cardiology

## 2016-05-20 ENCOUNTER — Other Ambulatory Visit (HOSPITAL_COMMUNITY): Payer: Commercial Managed Care - HMO

## 2016-05-21 ENCOUNTER — Ambulatory Visit (INDEPENDENT_AMBULATORY_CARE_PROVIDER_SITE_OTHER): Payer: Commercial Managed Care - HMO | Admitting: Pharmacist

## 2016-05-21 DIAGNOSIS — Z7901 Long term (current) use of anticoagulants: Secondary | ICD-10-CM

## 2016-05-21 DIAGNOSIS — Z952 Presence of prosthetic heart valve: Secondary | ICD-10-CM | POA: Diagnosis not present

## 2016-05-21 DIAGNOSIS — I639 Cerebral infarction, unspecified: Secondary | ICD-10-CM

## 2016-05-21 DIAGNOSIS — I359 Nonrheumatic aortic valve disorder, unspecified: Secondary | ICD-10-CM

## 2016-05-21 DIAGNOSIS — I6350 Cerebral infarction due to unspecified occlusion or stenosis of unspecified cerebral artery: Secondary | ICD-10-CM

## 2016-05-21 LAB — POCT INR: INR: 1.2

## 2016-05-24 ENCOUNTER — Emergency Department (HOSPITAL_COMMUNITY): Payer: Commercial Managed Care - HMO

## 2016-05-24 ENCOUNTER — Encounter (HOSPITAL_COMMUNITY): Payer: Self-pay | Admitting: Emergency Medicine

## 2016-05-24 ENCOUNTER — Inpatient Hospital Stay (HOSPITAL_COMMUNITY)
Admission: EM | Admit: 2016-05-24 | Discharge: 2016-06-07 | DRG: 919 | Disposition: A | Discharge status: Home / Self Care | Payer: Commercial Managed Care - HMO | Attending: Internal Medicine | Admitting: Internal Medicine

## 2016-05-24 DIAGNOSIS — R58 Hemorrhage, not elsewhere classified: Secondary | ICD-10-CM

## 2016-05-24 DIAGNOSIS — A419 Sepsis, unspecified organism: Secondary | ICD-10-CM | POA: Diagnosis not present

## 2016-05-24 DIAGNOSIS — J810 Acute pulmonary edema: Secondary | ICD-10-CM | POA: Diagnosis not present

## 2016-05-24 DIAGNOSIS — M96841 Postprocedural hematoma of a musculoskeletal structure following other procedure: Secondary | ICD-10-CM | POA: Diagnosis present

## 2016-05-24 DIAGNOSIS — I5033 Acute on chronic diastolic (congestive) heart failure: Secondary | ICD-10-CM | POA: Diagnosis not present

## 2016-05-24 DIAGNOSIS — D72829 Elevated white blood cell count, unspecified: Secondary | ICD-10-CM | POA: Diagnosis present

## 2016-05-24 DIAGNOSIS — E878 Other disorders of electrolyte and fluid balance, not elsewhere classified: Secondary | ICD-10-CM | POA: Diagnosis not present

## 2016-05-24 DIAGNOSIS — K219 Gastro-esophageal reflux disease without esophagitis: Secondary | ICD-10-CM | POA: Diagnosis present

## 2016-05-24 DIAGNOSIS — D62 Acute posthemorrhagic anemia: Secondary | ICD-10-CM | POA: Diagnosis present

## 2016-05-24 DIAGNOSIS — E876 Hypokalemia: Secondary | ICD-10-CM | POA: Diagnosis not present

## 2016-05-24 DIAGNOSIS — Z9889 Other specified postprocedural states: Secondary | ICD-10-CM

## 2016-05-24 DIAGNOSIS — J811 Chronic pulmonary edema: Secondary | ICD-10-CM

## 2016-05-24 DIAGNOSIS — Z952 Presence of prosthetic heart valve: Secondary | ICD-10-CM

## 2016-05-24 DIAGNOSIS — R0602 Shortness of breath: Secondary | ICD-10-CM | POA: Diagnosis not present

## 2016-05-24 DIAGNOSIS — Z823 Family history of stroke: Secondary | ICD-10-CM

## 2016-05-24 DIAGNOSIS — S3011XA Contusion of abdominal wall, initial encounter: Secondary | ICD-10-CM | POA: Diagnosis present

## 2016-05-24 DIAGNOSIS — Z951 Presence of aortocoronary bypass graft: Secondary | ICD-10-CM

## 2016-05-24 DIAGNOSIS — J9600 Acute respiratory failure, unspecified whether with hypoxia or hypercapnia: Secondary | ICD-10-CM

## 2016-05-24 DIAGNOSIS — Z79899 Other long term (current) drug therapy: Secondary | ICD-10-CM | POA: Diagnosis not present

## 2016-05-24 DIAGNOSIS — Z452 Encounter for adjustment and management of vascular access device: Secondary | ICD-10-CM

## 2016-05-24 DIAGNOSIS — I9581 Postprocedural hypotension: Secondary | ICD-10-CM | POA: Diagnosis present

## 2016-05-24 DIAGNOSIS — S301XXD Contusion of abdominal wall, subsequent encounter: Secondary | ICD-10-CM

## 2016-05-24 DIAGNOSIS — I2510 Atherosclerotic heart disease of native coronary artery without angina pectoris: Secondary | ICD-10-CM | POA: Diagnosis present

## 2016-05-24 DIAGNOSIS — E785 Hyperlipidemia, unspecified: Secondary | ICD-10-CM | POA: Diagnosis present

## 2016-05-24 DIAGNOSIS — N179 Acute kidney failure, unspecified: Secondary | ICD-10-CM | POA: Diagnosis present

## 2016-05-24 DIAGNOSIS — D6832 Hemorrhagic disorder due to extrinsic circulating anticoagulants: Secondary | ICD-10-CM | POA: Diagnosis present

## 2016-05-24 DIAGNOSIS — R079 Chest pain, unspecified: Secondary | ICD-10-CM

## 2016-05-24 DIAGNOSIS — Z981 Arthrodesis status: Secondary | ICD-10-CM

## 2016-05-24 DIAGNOSIS — I110 Hypertensive heart disease with heart failure: Secondary | ICD-10-CM | POA: Diagnosis present

## 2016-05-24 DIAGNOSIS — R140 Abdominal distension (gaseous): Secondary | ICD-10-CM | POA: Diagnosis not present

## 2016-05-24 DIAGNOSIS — E039 Hypothyroidism, unspecified: Secondary | ICD-10-CM | POA: Diagnosis present

## 2016-05-24 DIAGNOSIS — Z8673 Personal history of transient ischemic attack (TIA), and cerebral infarction without residual deficits: Secondary | ICD-10-CM

## 2016-05-24 DIAGNOSIS — Y838 Other surgical procedures as the cause of abnormal reaction of the patient, or of later complication, without mention of misadventure at the time of the procedure: Secondary | ICD-10-CM | POA: Diagnosis present

## 2016-05-24 DIAGNOSIS — J9690 Respiratory failure, unspecified, unspecified whether with hypoxia or hypercapnia: Secondary | ICD-10-CM

## 2016-05-24 DIAGNOSIS — Z7901 Long term (current) use of anticoagulants: Secondary | ICD-10-CM

## 2016-05-24 DIAGNOSIS — I248 Other forms of acute ischemic heart disease: Secondary | ICD-10-CM | POA: Diagnosis present

## 2016-05-24 DIAGNOSIS — I739 Peripheral vascular disease, unspecified: Secondary | ICD-10-CM | POA: Diagnosis present

## 2016-05-24 DIAGNOSIS — K661 Hemoperitoneum: Secondary | ICD-10-CM | POA: Diagnosis present

## 2016-05-24 DIAGNOSIS — S301XXA Contusion of abdominal wall, initial encounter: Secondary | ICD-10-CM | POA: Diagnosis not present

## 2016-05-24 DIAGNOSIS — Z9289 Personal history of other medical treatment: Secondary | ICD-10-CM

## 2016-05-24 DIAGNOSIS — J9601 Acute respiratory failure with hypoxia: Secondary | ICD-10-CM | POA: Diagnosis not present

## 2016-05-24 DIAGNOSIS — R748 Abnormal levels of other serum enzymes: Secondary | ICD-10-CM | POA: Diagnosis not present

## 2016-05-24 DIAGNOSIS — K449 Diaphragmatic hernia without obstruction or gangrene: Secondary | ICD-10-CM | POA: Diagnosis present

## 2016-05-24 DIAGNOSIS — Z87891 Personal history of nicotine dependence: Secondary | ICD-10-CM

## 2016-05-24 DIAGNOSIS — S301XXS Contusion of abdominal wall, sequela: Secondary | ICD-10-CM | POA: Diagnosis not present

## 2016-05-24 DIAGNOSIS — Z8719 Personal history of other diseases of the digestive system: Secondary | ICD-10-CM

## 2016-05-24 DIAGNOSIS — I959 Hypotension, unspecified: Secondary | ICD-10-CM | POA: Diagnosis not present

## 2016-05-24 DIAGNOSIS — T45515A Adverse effect of anticoagulants, initial encounter: Secondary | ICD-10-CM | POA: Diagnosis present

## 2016-05-24 DIAGNOSIS — Z8249 Family history of ischemic heart disease and other diseases of the circulatory system: Secondary | ICD-10-CM

## 2016-05-24 LAB — I-STAT CHEM 8, ED
BUN: 17 mg/dL (ref 6–20)
CALCIUM ION: 1.02 mmol/L — AB (ref 1.15–1.40)
Chloride: 105 mmol/L (ref 101–111)
Creatinine, Ser: 1.4 mg/dL — ABNORMAL HIGH (ref 0.44–1.00)
GLUCOSE: 167 mg/dL — AB (ref 65–99)
HCT: 22 % — ABNORMAL LOW (ref 36.0–46.0)
Hemoglobin: 7.5 g/dL — ABNORMAL LOW (ref 12.0–15.0)
Potassium: 5.3 mmol/L — ABNORMAL HIGH (ref 3.5–5.1)
SODIUM: 135 mmol/L (ref 135–145)
TCO2: 20 mmol/L (ref 0–100)

## 2016-05-24 LAB — BASIC METABOLIC PANEL
ANION GAP: 12 (ref 5–15)
BUN: 16 mg/dL (ref 6–20)
CHLORIDE: 106 mmol/L (ref 101–111)
CO2: 18 mmol/L — ABNORMAL LOW (ref 22–32)
Calcium: 8.1 mg/dL — ABNORMAL LOW (ref 8.9–10.3)
Creatinine, Ser: 1.6 mg/dL — ABNORMAL HIGH (ref 0.44–1.00)
GFR, EST AFRICAN AMERICAN: 40 mL/min — AB (ref >60)
GFR, EST NON AFRICAN AMERICAN: 34 mL/min — AB (ref >60)
Glucose, Bld: 197 mg/dL — ABNORMAL HIGH (ref 65–99)
POTASSIUM: 4.4 mmol/L (ref 3.5–5.1)
SODIUM: 136 mmol/L (ref 135–145)

## 2016-05-24 LAB — COMPREHENSIVE METABOLIC PANEL
ALBUMIN: 3.1 g/dL — AB (ref 3.5–5.0)
ALK PHOS: 74 U/L (ref 38–126)
ALT: 16 U/L (ref 14–54)
AST: 44 U/L — AB (ref 15–41)
Anion gap: 13 (ref 5–15)
BILIRUBIN TOTAL: 1.1 mg/dL (ref 0.3–1.2)
BUN: 14 mg/dL (ref 6–20)
CO2: 15 mmol/L — ABNORMAL LOW (ref 22–32)
Calcium: 8.2 mg/dL — ABNORMAL LOW (ref 8.9–10.3)
Chloride: 106 mmol/L (ref 101–111)
Creatinine, Ser: 1.43 mg/dL — ABNORMAL HIGH (ref 0.44–1.00)
GFR calc Af Amer: 45 mL/min — ABNORMAL LOW (ref >60)
GFR calc non Af Amer: 39 mL/min — ABNORMAL LOW (ref >60)
GLUCOSE: 161 mg/dL — AB (ref 65–99)
POTASSIUM: 5.4 mmol/L — AB (ref 3.5–5.1)
Sodium: 134 mmol/L — ABNORMAL LOW (ref 135–145)
TOTAL PROTEIN: 5.9 g/dL — AB (ref 6.5–8.1)

## 2016-05-24 LAB — CBC WITH DIFFERENTIAL/PLATELET
BASOS PCT: 0 %
Basophils Absolute: 0 10*3/uL (ref 0.0–0.1)
EOS ABS: 0 10*3/uL (ref 0.0–0.7)
Eosinophils Relative: 0 %
HCT: 22.3 % — ABNORMAL LOW (ref 36.0–46.0)
HEMOGLOBIN: 7 g/dL — AB (ref 12.0–15.0)
Lymphocytes Relative: 9 %
Lymphs Abs: 1.3 10*3/uL (ref 0.7–4.0)
MCH: 27.1 pg (ref 26.0–34.0)
MCHC: 31.4 g/dL (ref 30.0–36.0)
MCV: 86.4 fL (ref 78.0–100.0)
Monocytes Absolute: 1 10*3/uL (ref 0.1–1.0)
Monocytes Relative: 7 %
NEUTROS PCT: 84 %
Neutro Abs: 12 10*3/uL — ABNORMAL HIGH (ref 1.7–7.7)
PLATELETS: 449 10*3/uL — AB (ref 150–400)
RBC: 2.58 MIL/uL — AB (ref 3.87–5.11)
RDW: 15.9 % — ABNORMAL HIGH (ref 11.5–15.5)
WBC: 14.3 10*3/uL — AB (ref 4.0–10.5)

## 2016-05-24 LAB — APTT
aPTT: 49 seconds — ABNORMAL HIGH (ref 24–36)
aPTT: 48 seconds — ABNORMAL HIGH (ref 24–36)

## 2016-05-24 LAB — PREPARE RBC (CROSSMATCH)

## 2016-05-24 LAB — CBC
HEMATOCRIT: 31.9 % — AB (ref 36.0–46.0)
HEMOGLOBIN: 10.6 g/dL — AB (ref 12.0–15.0)
MCH: 29.2 pg (ref 26.0–34.0)
MCHC: 33.2 g/dL (ref 30.0–36.0)
MCV: 87.9 fL (ref 78.0–100.0)
Platelets: 389 10*3/uL (ref 150–400)
RBC: 3.63 MIL/uL — AB (ref 3.87–5.11)
RDW: 14.6 % (ref 11.5–15.5)
WBC: 21.1 10*3/uL — ABNORMAL HIGH (ref 4.0–10.5)

## 2016-05-24 LAB — PROTIME-INR
INR: 2.96
INR: 2.92
Prothrombin Time: 31.4 seconds — ABNORMAL HIGH (ref 11.4–15.2)
Prothrombin Time: 31.1 seconds — ABNORMAL HIGH (ref 11.4–15.2)

## 2016-05-24 LAB — I-STAT CG4 LACTIC ACID, ED: Lactic Acid, Venous: 3.31 mmol/L (ref 0.5–1.9)

## 2016-05-24 MED ORDER — ONDANSETRON HCL 4 MG/2ML IJ SOLN
4.0000 mg | Freq: Once | INTRAMUSCULAR | Status: AC
  Administered 2016-05-24: 4 mg via INTRAVENOUS
  Filled 2016-05-24: qty 2

## 2016-05-24 MED ORDER — SODIUM CHLORIDE 0.9 % IV SOLN
10.0000 mL/h | Freq: Once | INTRAVENOUS | Status: AC
  Administered 2016-05-24: 10 mL/h via INTRAVENOUS

## 2016-05-24 MED ORDER — ACETAMINOPHEN 500 MG PO TABS
500.0000 mg | ORAL_TABLET | Freq: Four times a day (QID) | ORAL | Status: DC | PRN
  Administered 2016-05-26: 650 mg via ORAL
  Administered 2016-05-26 – 2016-05-28 (×3): 500 mg via ORAL
  Administered 2016-05-30 – 2016-05-31 (×3): 1000 mg via ORAL
  Administered 2016-05-31 – 2016-06-01 (×3): 500 mg via ORAL
  Administered 2016-06-01: 650 mg via ORAL
  Administered 2016-06-02: 500 mg via ORAL
  Administered 2016-06-02 – 2016-06-06 (×5): 1000 mg via ORAL
  Filled 2016-05-24: qty 2
  Filled 2016-05-24 (×2): qty 1
  Filled 2016-05-24: qty 2
  Filled 2016-05-24 (×2): qty 1
  Filled 2016-05-24: qty 2
  Filled 2016-05-24: qty 1
  Filled 2016-05-24 (×2): qty 2
  Filled 2016-05-24 (×2): qty 1
  Filled 2016-05-24 (×4): qty 2

## 2016-05-24 MED ORDER — HYDROMORPHONE HCL 2 MG/ML IJ SOLN
0.2000 mg | INTRAMUSCULAR | Status: DC | PRN
  Administered 2016-05-24: 0.4 mg via INTRAVENOUS
  Filled 2016-05-24: qty 1

## 2016-05-24 MED ORDER — FENTANYL CITRATE (PF) 100 MCG/2ML IJ SOLN
50.0000 ug | Freq: Once | INTRAMUSCULAR | Status: AC
  Administered 2016-05-24: 50 ug via INTRAVENOUS
  Filled 2016-05-24: qty 2

## 2016-05-24 MED ORDER — HYDROMORPHONE HCL 2 MG/ML IJ SOLN
0.4000 mg | Freq: Once | INTRAMUSCULAR | Status: AC
  Administered 2016-05-24: 0.4 mg via INTRAVENOUS
  Filled 2016-05-24: qty 1

## 2016-05-24 MED ORDER — PANTOPRAZOLE SODIUM 40 MG PO TBEC
40.0000 mg | DELAYED_RELEASE_TABLET | Freq: Every day | ORAL | Status: DC
  Administered 2016-05-25 – 2016-06-07 (×14): 40 mg via ORAL
  Filled 2016-05-24 (×14): qty 1

## 2016-05-24 MED ORDER — SODIUM CHLORIDE 0.9 % IV BOLUS (SEPSIS)
500.0000 mL | Freq: Once | INTRAVENOUS | Status: AC
  Administered 2016-05-24: 500 mL via INTRAVENOUS

## 2016-05-24 MED ORDER — DOCUSATE SODIUM 100 MG PO CAPS
100.0000 mg | ORAL_CAPSULE | Freq: Two times a day (BID) | ORAL | Status: DC
  Administered 2016-05-25 – 2016-06-03 (×11): 100 mg via ORAL
  Filled 2016-05-24 (×22): qty 1

## 2016-05-24 MED ORDER — SODIUM CHLORIDE 0.9 % IV SOLN: 10.0000 mL/h | Freq: Once | INTRAVENOUS | Status: DC

## 2016-05-24 MED ORDER — ONDANSETRON HCL 4 MG PO TABS
4.0000 mg | ORAL_TABLET | Freq: Four times a day (QID) | ORAL | Status: DC | PRN
Start: 2016-05-24 — End: 2016-06-07

## 2016-05-24 MED ORDER — IOPAMIDOL (ISOVUE-300) INJECTION 61%
INTRAVENOUS | Status: AC
  Administered 2016-05-24: 75 mL
  Filled 2016-05-24: qty 75

## 2016-05-24 MED ORDER — LEVOTHYROXINE SODIUM 50 MCG PO TABS
50.0000 ug | ORAL_TABLET | Freq: Every day | ORAL | Status: DC
  Administered 2016-05-25 – 2016-06-07 (×14): 50 ug via ORAL
  Filled 2016-05-24 (×14): qty 1

## 2016-05-24 MED ORDER — HYDROMORPHONE HCL 1 MG/ML IJ SOLN
0.2000 mg | INTRAMUSCULAR | Status: DC | PRN
  Administered 2016-05-25 (×6): 0.4 mg via INTRAVENOUS
  Filled 2016-05-24 (×6): qty 1

## 2016-05-24 MED ORDER — SODIUM CHLORIDE 0.9 % IV SOLN
INTRAVENOUS | Status: DC
  Administered 2016-05-24 – 2016-05-26 (×3): via INTRAVENOUS

## 2016-05-24 MED ORDER — ONDANSETRON HCL 4 MG/2ML IJ SOLN
4.0000 mg | Freq: Four times a day (QID) | INTRAMUSCULAR | Status: DC | PRN
  Administered 2016-05-25 – 2016-05-26 (×4): 4 mg via INTRAVENOUS
  Filled 2016-05-24 (×4): qty 2

## 2016-05-24 NOTE — ED Triage Notes (Signed)
Pt to ED via [facility] with c/o abd pain and swelling, had hernia repair at [facility] Nov 3rd. Pt states abd started swelling last night, with bruising, supra-pubic and incision area-- more prominent on left abd through to flank area.

## 2016-05-24 NOTE — ED Provider Notes (Signed)
[facility] DEPT Provider Note   CSN: [id number] Arrival date & time: 05/24/16  1249     History   Chief Complaint Chief Complaint  Patient presents with  . Abdominal Pain  . Post-op Problem    HPI Elizabeth Morton is a 60 y.o. female.  HPI Patient presents with abdominal pain and bruising. She had a umbilical hernia repair by Dr. Hassell Done 9 days ago. She is on Coumadin and Lovenox for valve replacement. States that last night her abdomen began to get more swollen and painful. Now has bruising over her abdomen now. States that when she had gallbladder out and when she had a polyp removed she needed to get blood transfusions. Her INR was checked a couple days ago and was subtherapeutic. No blood in the stool. States she has had a bowel movement since the surgery. Hypotensive and diaphoretic for EMS.  Past Medical History:  Diagnosis Date  . Aortic stenosis    Aortic valve replacement 2006  . CAD (coronary artery disease)    a. s/p CABG 2006 at time of AVR.  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 Dyslipidemia   . Gallstones   . GERD (gastroesophageal reflux disease)   . Heart murmur   . Hx of CABG    2006,LIMA to LAD, SVG to diagonal  . Hx of transfusion of packed red blood cells   . Hypertension   . Hypothyroidism   . Iron deficiency anemia   . Lower GI bleed 06/2015  . Pericardial effusion    Insignificant small pericardial effusion seen on echo, November, 2013  . Peripheral vascular disease ([place]) 06   blood clot -stroke  . S/P aortic valve replacement    a. Bentall procedure,#21 [employer] Jude mechanical valve conduit with reimplantation of the coronaries 2006  . Status post colonoscopy with polypectomy    "bleeding; colonoscopy was ~ 06/18/2015"  . Stroke [facility]) 2006   no deficits   . Warfarin anticoagulation    coumadin therapy    Patient Active Problem List   Diagnosis Date Noted  . Rectus sheath hematoma 05/24/2016  . S/P  repair of ventral hernia Nov 2017 05/15/2016  . Ventral hernia 05/15/2016  . Lower GI bleed   . Hypokalemia 06/26/2015  . Acute GI bleeding 06/25/2015  . Chronic congestive heart failure with left ventricular diastolic dysfunction ([place]) 06/25/2015  . Hypothyroidism 06/25/2015  . Acute blood loss anemia 06/25/2015  . Long term (current) use of anticoagulants 06/02/2012  . Preop cardiovascular exam   . Pericardial effusion   . CAD (coronary artery disease)   . S/P aortic valve replacement   . Aortic stenosis   . Ejection fraction   . Ecchymosis   . Chronic cholecystitis with calculus 05/25/2012  . Constipation, chronic 05/25/2012  . Needs screening Colonoscopy - Pt refused 05/25/2012  . Warfarin anticoagulation   . Hypertension   . Hx of CABG   . CVA (cerebral infarction)   . Dyslipidemia     Past Surgical History:  Procedure Laterality Date  . AORTIC VALVE REPLACEMENT  2006  . BACK SURGERY    . CARDIAC CATHETERIZATION  ~ 2006  . CARDIAC VALVE REPLACEMENT    . CESAREAN SECTION  1985  . COLONOSCOPY N/A 06/26/2015   Procedure: COLONOSCOPY;  Surgeon: Gatha Mayer, MD;  Location: [facility];  Service: Endoscopy;  Laterality: N/A;  . COLONOSCOPY W/ BIOPSIES AND POLYPECTOMY  06/18/2015  . CORONARY ARTERY BYPASS  GRAFT  2006  . ESOPHAGOGASTRODUODENOSCOPY  06/18/2015  . INSERTION OF MESH  05/15/2016   Procedure: INSERTION OF MESH;  Surgeon: Johnathan Hausen, MD;  Location: [facility];  Service: General;;  . LAPAROSCOPIC ASSISTED VENTRAL HERNIA REPAIR N/A 05/15/2016   Procedure: LAPAROSCOPIC ASSISTED VENTRAL HERNIA REPAIR WITH MESH;  Surgeon: Johnathan Hausen, MD;  Location: [facility];  Service: General;  Laterality: N/A;  . LAPAROSCOPIC CHOLECYSTECTOMY SINGLE PORT  07/21/2012   Procedure: LAPAROSCOPIC CHOLECYSTECTOMY SINGLE PORT;  Surgeon: Adin Hector, MD;  Location: [facility];  Service: General;  Laterality: N/A;  . LUMBAR FUSION  ~ 1973  . TONSILLECTOMY      OB History    No data available        Home Medications    Prior to Admission medications   Medication Sig Start Date End Date Taking? Authorizing Provider  acetaminophen (TYLENOL) 500 MG tablet Take 500-1,000 mg by mouth every 6 (six) hours as needed (for pain).   Yes Historical Provider, MD  Calcium Carbonate-Vitamin D (CALCIUM 600+D) 600-400 MG-UNIT per tablet Take 1 tablet by mouth 2 (two) times daily.   Yes Historical Provider, MD  enoxaparin (LOVENOX) 80 MG/0.8ML injection Inject 0.8 mLs (80 mg total) into the skin every 12 (twelve) hours. 05/08/16  Yes Dorothy Spark, MD  furosemide (LASIX) 20 MG tablet Take 20 mg by mouth daily.  02/22/16  Yes Historical Provider, MD  levothyroxine (SYNTHROID, LEVOTHROID) 50 MCG tablet Take 50 mcg by mouth daily before breakfast.   Yes Historical Provider, MD  omeprazole (PRILOSEC) 20 MG capsule Take 20 mg by mouth daily.     Yes Historical Provider, MD  oxyCODONE (OXY IR/ROXICODONE) 5 MG immediate release tablet Take 1 tablet (5 mg total) by mouth every 4 (four) hours as needed for moderate pain. 05/17/16  Yes Johnathan Hausen, MD  potassium chloride (MICRO-K) 10 MEQ CR capsule TAKE 1 TABLET BY MOUTH EVERY DAY 05/02/14  Yes Carlena Bjornstad, MD  Simethicone (PHAZYME MAXIMUM STRENGTH) 250 MG CAPS Take 250 mg by mouth 2 (two) times daily as needed (for gas/flatulence).   Yes Historical Provider, MD  simvastatin (ZOCOR) 40 MG tablet Take 1 tablet (40 mg total) by mouth at bedtime. 07/14/13  Yes Carlena Bjornstad, MD  triazolam (HALCION) 0.25 MG tablet Take 0.25 mg by mouth at bedtime.   Yes Historical Provider, MD  warfarin (COUMADIN) 3 MG tablet TAKE 1 TABLET (3 MG TOTAL) BY MOUTH AS DIRECTED. Patient taking differently: Take 3-4.5 mg by mouth See admin instructions. Take 1 1/2 tablets (4.5 mg) by mouth on Friday evening (between 7pm and 7:30pm), take 1 tablet (3 mg) on all other evenings of the week (Sat thru Thurs) 04/27/16  Yes Dorothy Spark, MD    Family History Family History    Problem Relation Age of Onset  . Stroke Mother   . Heart disease Mother   . Alcohol abuse Father   . Hypertension Father   . Stroke Father   . Hyperlipidemia Father   . Stroke Brother     she has three and at least one of them has had a stroke  . Colon cancer Neg Hx     Social History Social History  Substance Use Topics  . Smoking status: Former Smoker    Packs/day: 1.00    Years: 25.00    Types: Cigarettes    Quit date: 07/15/2004  . Smokeless tobacco: Never Used  . Alcohol use No     Allergies  Patient has no known allergies.   Review of Systems Review of Systems  Constitutional: Positive for fatigue. Negative for appetite change.  Respiratory: Positive for shortness of breath.   Gastrointestinal: Positive for abdominal pain and nausea. Negative for blood in stool and vomiting.  Genitourinary: Negative for dyspareunia.  Musculoskeletal: Negative for back pain.  Neurological: Negative for headaches.  Psychiatric/Behavioral: Negative for confusion.     Physical Exam Updated Vital Signs BP (!) 127/54   Pulse 98   Temp 97.9 F (36.6 C)   Resp 23   Ht 5\' 2"  (1.575 m)   Wt 170 lb (77.1 kg)   SpO2 100%   BMI 31.09 kg/m   Physical Exam  Constitutional: She appears well-developed.  HENT:  Head: Atraumatic.  Neck: Neck supple.  Cardiovascular:  Mild tachycardia.  Abdominal: There is tenderness.  Diffuse tenderness, worse on left side. There is fullness in the peri-umbilical area and lower abdomen area. Surgical sites appeared be healing well. The ecchymosis goes from the left flank around to the lower abdomen also.  Neurological: She is alert.  Skin: Skin is warm. There is pallor.     ED Treatments / Results  Labs (all labs ordered are listed, but only abnormal results are displayed) Labs Reviewed  COMPREHENSIVE METABOLIC PANEL - Abnormal; Notable for the following:       Result Value   Sodium 134 (*)    Potassium 5.4 (*)    CO2 15 (*)     Glucose, Bld 161 (*)    Creatinine, Ser 1.43 (*)    Calcium 8.2 (*)    Total Protein 5.9 (*)    Albumin 3.1 (*)    AST 44 (*)    GFR calc non Af Amer 39 (*)    GFR calc Af Amer 45 (*)    All other components within normal limits  CBC WITH DIFFERENTIAL/PLATELET - Abnormal; Notable for the following:    WBC 14.3 (*)    RBC 2.58 (*)    Hemoglobin 7.0 (*)    HCT 22.3 (*)    RDW 15.9 (*)    Platelets 449 (*)    Neutro Abs 12.0 (*)    All other components within normal limits  PROTIME-INR - Abnormal; Notable for the following:    Prothrombin Time 31.4 (*)    All other components within normal limits  APTT - Abnormal; Notable for the following:    aPTT 49 (*)    All other components within normal limits  I-STAT CHEM 8, ED - Abnormal; Notable for the following:    Potassium 5.3 (*)    Creatinine, Ser 1.40 (*)    Glucose, Bld 167 (*)    Calcium, Ion 1.02 (*)    Hemoglobin 7.5 (*)    HCT 22.0 (*)    All other components within normal limits  I-STAT CG4 LACTIC ACID, ED - Abnormal; Notable for the following:    Lactic Acid, Venous 3.31 (*)    All other components within normal limits  CBC  CBC  PROTIME-INR  APTT  TYPE AND SCREEN  PREPARE RBC (CROSSMATCH)  PREPARE FRESH FROZEN PLASMA    EKG  EKG Interpretation None       Radiology Ct Abdomen Pelvis W Contrast  Result Date: 05/24/2016 CLINICAL DATA:  Left-sided abdominal pain with bruising and nausea since yesterday. Patient had umbilical hernia surgery on 05/15/2016. EXAM: CT ABDOMEN AND PELVIS WITH CONTRAST TECHNIQUE: Multidetector CT imaging of the abdomen and pelvis was performed using  the standard protocol following bolus administration of intravenous contrast. CONTRAST:  81mL ISOVUE-300 IOPAMIDOL (ISOVUE-300) INJECTION 61% COMPARISON:  06/28/2015 FINDINGS: Lower chest: Subsegmental atelectasis both lung bases. The heart is enlarged. Patient is status post CABG. Hepatobiliary: No focal abnormality within the liver  parenchyma. Gallbladder surgically absent. Stable mild prominence extrahepatic bile ducts. Pancreas: No focal mass lesion. No dilatation of the main duct. No intraparenchymal cyst. No peripancreatic edema. Spleen: No splenomegaly. No focal mass lesion. Adrenals/Urinary Tract: No adrenal nodule or mass. Kidneys are unremarkable. No evidence for hydroureter. The urinary bladder appears normal for the degree of distention. Stomach/Bowel: Moderate hiatal hernia noted. Stomach otherwise unremarkable. Duodenum is normally positioned as is the ligament of Treitz. No small bowel wall thickening. No small bowel dilatation. The terminal ileum is normal. The appendix is normal. No gross colonic mass. No colonic wall thickening. No substantial diverticular change. Vascular/Lymphatic: There is abdominal aortic atherosclerosis without aneurysm. There is no gastrohepatic or hepatoduodenal ligament lymphadenopathy. No intraperitoneal or retroperitoneal lymphadenopathy. No pelvic sidewall lymphadenopathy. Reproductive: The uterus has normal CT imaging appearance. There is no adnexal mass. Other: Small volume free fluid seen around the liver, in both para colic gutters, and in the anatomic pelvis. Fluid around the liver averages 34 Hounsfield units, consistent with blood. Musculoskeletal: Complex multiloculated anterior abdominal wall hematoma is identified in the abdomen and pelvis. - 3 x 6 x 8 cm collection of blood, fluid, and air is identified in the subcutaneous fat just to the left of the umbilicus. - 7 x 8 x 9 cm hematoma is identified in the midline abdomen. This is probably extraperitoneal given the focal nature of the hematoma, but loculated within the omentum cannot be entirely excluded. This hematoma appears to communicate with the hematoma described immediately below. - 6 x 9 x 11 cm hematoma is identified in the left rectus sheath and this appears to communicate with the hematoma listed below. - 8 x 10 x 9 cm hematoma  in the space of Retzius Multiple areas of gas and fluid are seen in the subcutaneous fat of the anterior abdominal wall. Many of these were seen on the preoperative study from 06/28/2015 and may represent injection sites. Bone windows reveal no worrisome lytic or sclerotic osseous lesions. IMPRESSION: 1. Multiloculated large hematoma of the anterior abdominal wall extends from the umbilicus down to the symphysis pubis. A Component contained in the left rectus sheath extends down into the space of Retzius. There appears to be communication between the rectus sheath hematoma and a midline hematoma which is probably extraperitoneal anteriorly between the transversalis fascia and the peritoneal lining given the focal appearance although containment within the omentum/gastrocolic ligament is possible but considered less likely. There is also a superficial component of the hematoma seen anterior to the rectus fascia. 2. Small volume hemoperitoneum. 3. Moderate hiatal hernia. Electronically Signed   By: Misty Stanley M.D.   On: 05/24/2016 14:22    Procedures Procedures (including critical care time)  Medications Ordered in ED Medications  0.9 %  sodium chloride infusion (not administered)  0.9 %  sodium chloride infusion (not administered)  HYDROmorphone (DILAUDID) injection 0.2-0.4 mg (not administered)  docusate sodium (COLACE) capsule 100 mg (not administered)  ondansetron (ZOFRAN) tablet 4 mg (not administered)    Or  ondansetron (ZOFRAN) injection 4 mg (not administered)  sodium chloride 0.9 % bolus 500 mL (0 mLs Intravenous Stopped 05/24/16 1414)  ondansetron (ZOFRAN) injection 4 mg (4 mg Intravenous Given 05/24/16 1323)  iopamidol (ISOVUE-300) 61 %  injection (75 mLs  Contrast Given 05/24/16 1343)  0.9 %  sodium chloride infusion (10 mL/hr Intravenous New Bag/Given 05/24/16 1429)  fentaNYL (SUBLIMAZE) injection 50 mcg (50 mcg Intravenous Given 05/24/16 1445)     Initial Impression / Assessment  and Plan / ED Course  I have reviewed the triage vital signs and the nursing notes.  Pertinent labs & imaging results that were available during my care of the patient were reviewed by me and considered in my medical decision making (see chart for details).  Clinical Course     Patient with abdominal pain and bleeding post endoscopic hernia repair. Has been on Lovenox and Coumadin. Hemoglobin has gone down to 7. Has likely bleeding from her rectus sheath. Seen by Dr Kae Heller and admitted. Discuss with cardiology. Aortic valve does not need severe anticoagulation at this time and was reversed with FFP.  CRITICAL CARE Performed by: Mackie Pai Total critical care time:30 minutes Critical care time was exclusive of separately billable procedures and treating other patients. Critical care was necessary to treat or prevent imminent or life-threatening deterioration. Critical care was time spent personally by me on the following activities: development of treatment plan with patient and/or surrogate as well as nursing, discussions with consultants, evaluation of patient's response to treatment, examination of patient, obtaining history from patient or surrogate, ordering and performing treatments and interventions, ordering and review of laboratory studies, ordering and review of radiographic studies, pulse oximetry and re-evaluation of patient's condition.   Final Clinical Impressions(s) / ED Diagnoses   Final diagnoses:  Rectus sheath hematoma, initial encounter    New Prescriptions New Prescriptions   No medications on file     Davonna Belling, MD 05/24/16 [id number]

## 2016-05-24 NOTE — ED Notes (Signed)
Report called to rn on 3s

## 2016-05-24 NOTE — ED Notes (Signed)
Patient returned from CT

## 2016-05-24 NOTE — ED Notes (Signed)
Patient transported to CT 

## 2016-05-24 NOTE — H&P (Signed)
Surgical H&P  CC: bleeding  HPI: This is a 60yo woman with multiple comorbidities as described below who underwent a lap-assisted ventral hernia repair by Dr. Hassell Done on 11/3. The surgery and her post-op course were uncomplicated. She was discharged on 11/5 on a lovenox bridge and coumadin, which she takes for her mechanical aortic valve. She has had evolving abdominal wall bruising since surgery, but today when she looked in the mirror felt her abdomen to be more swollen, and has had worsening abdominal wall and back pain, particularly on the left abdominal wall. She has nausea and generally felt ill, so presented to the ER for evaluation. She was noted to have a 5-point drop in her hemoglobin since it was last checked 5 days ago. Of note she has still been taking her lovenox shots up to last night. A CT scan reveals multiple abdominal wall hematomas, see below.   No Known Allergies  Past Medical History:  Diagnosis Date  . Aortic stenosis    Aortic valve replacement 2006  . CAD (coronary artery disease)    a. s/p CABG 2006 at time of AVR.  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 Dyslipidemia   . Gallstones   . GERD (gastroesophageal reflux disease)   . Heart murmur   . Hx of CABG    2006,LIMA to LAD, SVG to diagonal  . Hx of transfusion of packed red blood cells   . Hypertension   . Hypothyroidism   . Iron deficiency anemia   . Lower GI bleed 06/2015  . Pericardial effusion    Insignificant small pericardial effusion seen on echo, November, 2013  . Peripheral vascular disease ([place]) 06   blood clot -stroke  . S/P aortic valve replacement    a. Bentall procedure,#21 [employer] mechanical valve conduit with reimplantation of the coronaries 2006  . Status post colonoscopy with polypectomy    "bleeding; colonoscopy was ~ 06/18/2015"  . Stroke [facility]) 2006   no deficits   . Warfarin anticoagulation    coumadin therapy    Past Surgical History:   Procedure Laterality Date  . AORTIC VALVE REPLACEMENT  2006  . BACK SURGERY    . CARDIAC CATHETERIZATION  ~ 2006  . CARDIAC VALVE REPLACEMENT    . CESAREAN SECTION  1985  . COLONOSCOPY N/A 06/26/2015   Procedure: COLONOSCOPY;  Surgeon: Gatha Mayer, MD;  Location: [place];  Service: Endoscopy;  Laterality: N/A;  . COLONOSCOPY W/ BIOPSIES AND POLYPECTOMY  06/18/2015  . CORONARY ARTERY BYPASS GRAFT  2006  . ESOPHAGOGASTRODUODENOSCOPY  06/18/2015  . INSERTION OF MESH  05/15/2016   Procedure: INSERTION OF MESH;  Surgeon: Johnathan Hausen, MD;  Location: [facility];  Service: General;;  . LAPAROSCOPIC ASSISTED VENTRAL HERNIA REPAIR N/A 05/15/2016   Procedure: LAPAROSCOPIC ASSISTED VENTRAL HERNIA REPAIR WITH MESH;  Surgeon: Johnathan Hausen, MD;  Location: [facility];  Service: General;  Laterality: N/A;  . LAPAROSCOPIC CHOLECYSTECTOMY SINGLE PORT  07/21/2012   Procedure: LAPAROSCOPIC CHOLECYSTECTOMY SINGLE PORT;  Surgeon: Adin Hector, MD;  Location: [place];  Service: General;  Laterality: N/A;  . LUMBAR FUSION  ~ 1973  . TONSILLECTOMY      Family History  Problem Relation Age of Onset  . Stroke Mother   . Heart disease Mother   . Alcohol abuse Father   . Hypertension Father   . Stroke Father   . Hyperlipidemia Father   . Stroke Brother  she has three and at least one of them has had a stroke  . Colon cancer Neg Hx     Social History   Social History  . Marital status: Married    Spouse name: N/A  . Number of children: 1  . Years of education: N/A   Occupational History  . homemaker    Social History Main Topics  . Smoking status: Former Smoker    Packs/day: 1.00    Years: 25.00    Types: Cigarettes    Quit date: 07/15/2004  . Smokeless tobacco: Never Used  . Alcohol use No  . Drug use: No  . Sexual activity: No   Other Topics Concern  . None   Social History Narrative   She lives in [place] with her husband and son. She previously smoked 37-pack-years,but quit  following her stroke in April of 2006. She denies any alcohol or drugs. She has not been routinely exercising.    No current facility-administered medications on file prior to encounter.    Current Outpatient Prescriptions on File Prior to Encounter  Medication Sig Dispense Refill  . acetaminophen (TYLENOL) 500 MG tablet Take 500-1,000 mg by mouth every 6 (six) hours as needed (for pain).    . Calcium Carbonate-Vitamin D (CALCIUM 600+D) 600-400 MG-UNIT per tablet Take 1 tablet by mouth 2 (two) times daily.    Marland Kitchen enoxaparin (LOVENOX) 80 MG/0.8ML injection Inject 0.8 mLs (80 mg total) into the skin every 12 (twelve) hours. 10 Syringe 1  . furosemide (LASIX) 20 MG tablet Take 20 mg by mouth daily.     Marland Kitchen levothyroxine (SYNTHROID, LEVOTHROID) 50 MCG tablet Take 50 mcg by mouth daily before breakfast.    . omeprazole (PRILOSEC) 20 MG capsule Take 20 mg by mouth daily.      Marland Kitchen oxyCODONE (OXY IR/ROXICODONE) 5 MG immediate release tablet Take 1 tablet (5 mg total) by mouth every 4 (four) hours as needed for moderate pain. 30 tablet 0  . potassium chloride (MICRO-K) 10 MEQ CR capsule TAKE 1 TABLET BY MOUTH EVERY DAY 15 capsule 0  . Simethicone (PHAZYME MAXIMUM STRENGTH) 250 MG CAPS Take 250 mg by mouth 2 (two) times daily as needed (for gas/flatulence).    . simvastatin (ZOCOR) 40 MG tablet Take 1 tablet (40 mg total) by mouth at bedtime. 15 tablet 0  . triazolam (HALCION) 0.25 MG tablet Take 0.25 mg by mouth at bedtime.    Marland Kitchen warfarin (COUMADIN) 3 MG tablet TAKE 1 TABLET (3 MG TOTAL) BY MOUTH AS DIRECTED. (Patient not taking: Reported on 05/11/2016) 40 tablet 3  . [DISCONTINUED] KLOR-CON M10 10 MEQ tablet TAKE 1 TABLET BY MOUTH EVERY DAY 30 tablet 0    Review of Systems: a complete, 10pt review of systems was completed with pertinent positives and negatives as documented in the HPI.   Physical Exam: Vitals:   05/24/16 1430 05/24/16 1440  BP: (!) 107/53 110/62  Pulse:  103  Resp: 18 21  Temp:  97.9  F (36.6 C)   Gen: A&Ox3, no distress but appears ill Head: normocephalic, atraumatic, EOMI, anicteric.  Neck: supple without mass or thyromegaly Chest: unlabored respirations   Cardiovascular: RRR with palpable distal pulses Abdomen: Soft, mildly distended, incisions healing without sign of infection or surrounding hematoma. There is evolving ecchymosis over the entire lower abdomen, left flank and left hip. The abdomen is tender along the left aspect. Extremities: warm, without edema, no deformities  Neuro: grossly intact Psych: appropriate mood and affect  Skin: overall sallow appearance  CBC Latest Ref Rng & Units 05/24/2016 05/24/2016 05/17/2016  WBC 4.0 - 10.5 K/uL - 14.3(H) 10.1  Hemoglobin 12.0 - 15.0 g/dL 7.5(L) 7.0(L) 12.0  Hematocrit 36.0 - 46.0 % 22.0(L) 22.3(L) 37.6  Platelets 150 - 400 K/uL - 449(H) 291    CMP Latest Ref Rng & Units 05/24/2016 05/24/2016 05/17/2016  Glucose 65 - 99 mg/dL 167(H) 161(H) 103(H)  BUN 6 - 20 mg/dL 17 14 16   Creatinine 0.44 - 1.00 mg/dL 1.40(H) 1.43(H) 1.22(H)  Sodium 135 - 145 mmol/L 135 134(L) 141  Potassium 3.5 - 5.1 mmol/L 5.3(H) 5.4(H) 4.2  Chloride 101 - 111 mmol/L 105 106 107  CO2 22 - 32 mmol/L - 15(L) 26  Calcium 8.9 - 10.3 mg/dL - 8.2(L) 8.9  Total Protein 6.5 - 8.1 g/dL - 5.9(L) -  Total Bilirubin 0.3 - 1.2 mg/dL - 1.1 -  Alkaline Phos 38 - 126 U/L - 74 -  AST 15 - 41 U/L - 44(H) -  ALT 14 - 54 U/L - 16 -    Lab Results  Component Value Date   INR 2.96 05/24/2016   INR 1.2 05/21/2016   INR 1.31 05/13/2016   PROTIME 18.3 12/11/2008    Imaging: CLINICAL DATA:  Left-sided abdominal pain with bruising and nausea since yesterday. Patient had umbilical hernia surgery on 05/15/2016.  EXAM: CT ABDOMEN AND PELVIS WITH CONTRAST  TECHNIQUE: Multidetector CT imaging of the abdomen and pelvis was performed using the standard protocol following bolus administration of intravenous contrast.  CONTRAST:  60mL ISOVUE-300  IOPAMIDOL (ISOVUE-300) INJECTION 61%  COMPARISON:  06/28/2015  FINDINGS: Lower chest: Subsegmental atelectasis both lung bases. The heart is enlarged. Patient is status post CABG.  Hepatobiliary: No focal abnormality within the liver parenchyma. Gallbladder surgically absent. Stable mild prominence extrahepatic bile ducts.  Pancreas: No focal mass lesion. No dilatation of the main duct. No intraparenchymal cyst. No peripancreatic edema.  Spleen: No splenomegaly. No focal mass lesion.  Adrenals/Urinary Tract: No adrenal nodule or mass. Kidneys are unremarkable. No evidence for hydroureter. The urinary bladder appears normal for the degree of distention.  Stomach/Bowel: Moderate hiatal hernia noted. Stomach otherwise unremarkable. Duodenum is normally positioned as is the ligament of Treitz. No small bowel wall thickening. No small bowel dilatation. The terminal ileum is normal. The appendix is normal. No gross colonic mass. No colonic wall thickening. No substantial diverticular change.  Vascular/Lymphatic: There is abdominal aortic atherosclerosis without aneurysm. There is no gastrohepatic or hepatoduodenal ligament lymphadenopathy. No intraperitoneal or retroperitoneal lymphadenopathy. No pelvic sidewall lymphadenopathy.  Reproductive: The uterus has normal CT imaging appearance. There is no adnexal mass.  Other: Small volume free fluid seen around the liver, in both para colic gutters, and in the anatomic pelvis. Fluid around the liver averages 34 Hounsfield units, consistent with blood.  Musculoskeletal: Complex multiloculated anterior abdominal wall hematoma is identified in the abdomen and pelvis.  - 3 x 6 x 8 cm collection of blood, fluid, and air is identified in the subcutaneous fat just to the left of the umbilicus.  - 7 x 8 x 9 cm hematoma is identified in the midline abdomen. This is probably extraperitoneal given the focal nature of the  hematoma, but loculated within the omentum cannot be entirely excluded. This hematoma appears to communicate with the hematoma described immediately below.  - 6 x 9 x 11 cm hematoma is identified in the left rectus sheath and this appears to communicate with the hematoma listed below.  -  8 x 10 x 9 cm hematoma in the space of Retzius  Multiple areas of gas and fluid are seen in the subcutaneous fat of the anterior abdominal wall. Many of these were seen on the preoperative study from 06/28/2015 and may represent injection sites.  Bone windows reveal no worrisome lytic or sclerotic osseous lesions.  IMPRESSION: 1. Multiloculated large hematoma of the anterior abdominal wall extends from the umbilicus down to the symphysis pubis. A Component contained in the left rectus sheath extends down into the space of Retzius. There appears to be communication between the rectus sheath hematoma and a midline hematoma which is probably extraperitoneal anteriorly between the transversalis fascia and the peritoneal lining given the focal appearance although containment within the omentum/gastrocolic ligament is possible but considered less likely. There is also a superficial component of the hematoma seen anterior to the rectus fascia. 2. Small volume hemoperitoneum. 3. Moderate hiatal hernia.   Electronically Signed   By: Misty Stanley M.D.   On: 05/24/2016 14:22  A/P: 9 days s/p uncomplicated lap-assisted ventral hernia repair, now with multiple abdominal wall hematomas which seem to be in communication with each other and a concomitant 5-pt drop in hemoglobin.  -Hold anticoagulation -She is now receiving pRBC and FFP. Will recheck CBC post-transfusion. If response is inappropriate will need angio.  -NPO, IVF, symptomatic relief/supportive care -Admit to stepdown  Romana Juniper, MD [facility], [place] Pager [phone]

## 2016-05-25 ENCOUNTER — Encounter (HOSPITAL_COMMUNITY): Payer: Self-pay | Admitting: *Deleted

## 2016-05-25 DIAGNOSIS — R748 Abnormal levels of other serum enzymes: Secondary | ICD-10-CM

## 2016-05-25 LAB — CBC
HEMATOCRIT: 22.2 % — AB (ref 36.0–46.0)
HEMATOCRIT: 23.4 % — AB (ref 36.0–46.0)
HEMATOCRIT: 27.2 % — AB (ref 36.0–46.0)
HEMOGLOBIN: 7.5 g/dL — AB (ref 12.0–15.0)
HEMOGLOBIN: 7.9 g/dL — AB (ref 12.0–15.0)
HEMOGLOBIN: 9.2 g/dL — AB (ref 12.0–15.0)
MCH: 29.4 pg (ref 26.0–34.0)
MCH: 29.5 pg (ref 26.0–34.0)
MCH: 29.6 pg (ref 26.0–34.0)
MCHC: 33.8 g/dL (ref 30.0–36.0)
MCHC: 33.8 g/dL (ref 30.0–36.0)
MCHC: 33.8 g/dL (ref 30.0–36.0)
MCV: 86.9 fL (ref 78.0–100.0)
MCV: 87.3 fL (ref 78.0–100.0)
MCV: 87.7 fL (ref 78.0–100.0)
Platelets: 294 10*3/uL (ref 150–400)
Platelets: 299 10*3/uL (ref 150–400)
Platelets: 356 10*3/uL (ref 150–400)
RBC: 2.53 MIL/uL — AB (ref 3.87–5.11)
RBC: 2.68 MIL/uL — AB (ref 3.87–5.11)
RBC: 3.13 MIL/uL — ABNORMAL LOW (ref 3.87–5.11)
RDW: 14.8 % (ref 11.5–15.5)
RDW: 14.9 % (ref 11.5–15.5)
RDW: 15.4 % (ref 11.5–15.5)
WBC: 17 10*3/uL — AB (ref 4.0–10.5)
WBC: 17.5 10*3/uL — AB (ref 4.0–10.5)
WBC: 21.3 10*3/uL — AB (ref 4.0–10.5)

## 2016-05-25 LAB — BASIC METABOLIC PANEL
ANION GAP: 8 (ref 5–15)
BUN: 21 mg/dL — ABNORMAL HIGH (ref 6–20)
CHLORIDE: 108 mmol/L (ref 101–111)
CO2: 23 mmol/L (ref 22–32)
Calcium: 8.3 mg/dL — ABNORMAL LOW (ref 8.9–10.3)
Creatinine, Ser: 1.76 mg/dL — ABNORMAL HIGH (ref 0.44–1.00)
GFR calc non Af Amer: 31 mL/min — ABNORMAL LOW (ref >60)
GFR, EST AFRICAN AMERICAN: 35 mL/min — AB (ref >60)
GLUCOSE: 135 mg/dL — AB (ref 65–99)
POTASSIUM: 5.3 mmol/L — AB (ref 3.5–5.1)
Sodium: 139 mmol/L (ref 135–145)

## 2016-05-25 LAB — TROPONIN I
TROPONIN I: 0.04 ng/mL — AB (ref <0.03)
TROPONIN I: 0.16 ng/mL — AB (ref <0.03)

## 2016-05-25 LAB — CK TOTAL AND CKMB (NOT AT ARMC)
CK TOTAL: 54 U/L (ref 38–234)
CK, MB: 15 ng/mL — AB (ref 0.5–5.0)
RELATIVE INDEX: INVALID (ref 0.0–2.5)

## 2016-05-25 LAB — MRSA PCR SCREENING: MRSA by PCR: NEGATIVE

## 2016-05-25 LAB — PROTIME-INR
INR: 2.04
Prothrombin Time: 23.4 seconds — ABNORMAL HIGH (ref 11.4–15.2)

## 2016-05-25 LAB — PREPARE RBC (CROSSMATCH)

## 2016-05-25 LAB — APTT: APTT: 41 s — AB (ref 24–36)

## 2016-05-25 LAB — ABO/RH: ABO/RH(D): AB POS

## 2016-05-25 MED ORDER — FUROSEMIDE 10 MG/ML IJ SOLN
INTRAMUSCULAR | Status: AC
  Administered 2016-05-25: 60 mg
  Filled 2016-05-25: qty 8

## 2016-05-25 MED ORDER — IPRATROPIUM-ALBUTEROL 0.5-2.5 (3) MG/3ML IN SOLN
3.0000 mL | Freq: Four times a day (QID) | RESPIRATORY_TRACT | Status: DC
  Administered 2016-05-26 (×3): 3 mL via RESPIRATORY_TRACT
  Filled 2016-05-25 (×4): qty 3

## 2016-05-25 MED ORDER — FUROSEMIDE 10 MG/ML IJ SOLN
INTRAMUSCULAR | Status: AC
  Administered 2016-05-25: 20 mg
  Filled 2016-05-25: qty 2

## 2016-05-25 MED ORDER — SODIUM CHLORIDE 0.9 % IV SOLN
Freq: Once | INTRAVENOUS | Status: AC
  Administered 2016-05-25: 20:00:00 via INTRAVENOUS

## 2016-05-25 MED ORDER — ORAL CARE MOUTH RINSE
15.0000 mL | Freq: Two times a day (BID) | OROMUCOSAL | Status: DC
  Administered 2016-05-25 – 2016-05-29 (×4): 15 mL via OROMUCOSAL

## 2016-05-25 NOTE — Plan of Care (Signed)
Problem: Health Behavior/Discharge Planning: Goal: Ability to manage health-related needs will improve Outcome: Progressing Patient being transferred to [facility] today.   Problem: Pain Managment: Goal: General experience of comfort will improve Outcome: Progressing Receiving prn pain medications as ordered.   Problem: Nutrition: Goal: Adequate nutrition will be maintained Outcome: Not Progressing Patient currently NPO.

## 2016-05-25 NOTE — Progress Notes (Signed)
Notified Dr.Connor about elevated HR 130's patient asymptomatic. I will continue plasma and continue to monitor patient.

## 2016-05-25 NOTE — Progress Notes (Signed)
Dr. Zella Richer informed on critical troponin 0.16 at [place].

## 2016-05-25 NOTE — Progress Notes (Signed)
Patient ID: Elizabeth Morton, female   DOB: 1956-01-16, 60 y.o.   MRN: [id number]   Patient arrived [facility] this afternoon and seen by me.  CT reviewed and large abdominal wall hematomas related to Lovenox injections with significant bleeding.  INR 2 today.  Discussed with pharmacy.  EKG on transfer suggested subendocardial MI.  Troponins and CK MB positive.  Patient had valve replaced by Dr. Cyndia Bent and is followed by [facility] and the coumadin clinic.  Stat cardiology consult requested via answering service.   Dr Zella Richer is on call for [facility] this evening but I am available by phone if needed.  Kaylyn Lim, MD  [phone]

## 2016-05-25 NOTE — Progress Notes (Signed)
CRITICAL VALUE ALERT  Critical value received: Troponin 0.04  Date of notification: 05/25/2016  Time of notification:  [id number]  Critical value read back:Yes.    Nurse who received alert:  Leanna Sato, RN  MD notified (1st page):  Hassell Done  Time of first page:  1610  Time of second page: 1751  Time MD responded:  1754

## 2016-05-25 NOTE — Consult Note (Signed)
Primary Physician:  Nyoka Cowden   Primary Cardiologist:  Meda Coffee    Asked to see re Abnormal troponin and SOB    HPI:Pt is a 60 yo with severe AS and CAD (s/p CABG x2 : LIMA to LAD; SVG to diagonal, and AVR (mechanical AVR as part of Bentall procedure in 2006).  ALso a hsitroy of HTN, HL, CVA (due to thrombosed aortic aneurysm), GERD, Fe defic anemia, lower GI bleed.    Echo on Oc 27 LVEF 60 to 65% with mean gradient across AV prostheiss of 30 mm Hg (unchanged from 2016),    Pt underwent surgery on 11/3  Sent home on SQ lovenox injections with bridge to coumadin on 11/5.  Pt developed abdominal wall bruising and then swelling of abdomen  Came to ER   CT scan showed several large hematomas  Hgb was 7.5 Trop was 0.04, 0.06.   Pt complained of SOB  NO CP  Did not get CP at home  Did get some SOB with activity      Past Medical History:  Diagnosis Date  . Aortic stenosis    Aortic valve replacement 2006  . CAD (coronary artery disease)    a. s/p CABG 2006 at time of AVR.  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 Dyslipidemia   . Gallstones   . GERD (gastroesophageal reflux disease)   . Heart murmur   . Hx of CABG    2006,LIMA to LAD, SVG to diagonal  . Hx of transfusion of packed red blood cells   . Hypertension   . Hypothyroidism   . Iron deficiency anemia   . Lower GI bleed 06/2015  . Pericardial effusion    Insignificant small pericardial effusion seen on echo, November, 2013  . Peripheral vascular disease ([place]) 06   blood clot -stroke  . S/P aortic valve replacement    a. Bentall procedure,#21 [employer] Jude mechanical valve conduit with reimplantation of the coronaries 2006  . Status post colonoscopy with polypectomy    "bleeding; colonoscopy was ~ 06/18/2015"  . Stroke [facility]) 2006   no deficits   . Warfarin anticoagulation    coumadin therapy    Medications Prior to Admission  Medication Sig Dispense Refill  . acetaminophen (TYLENOL) 500  MG tablet Take 500-1,000 mg by mouth every 6 (six) hours as needed (for pain).    . Calcium Carbonate-Vitamin D (CALCIUM 600+D) 600-400 MG-UNIT per tablet Take 1 tablet by mouth 2 (two) times daily.    Marland Kitchen enoxaparin (LOVENOX) 80 MG/0.8ML injection Inject 0.8 mLs (80 mg total) into the skin every 12 (twelve) hours. 10 Syringe 1  . furosemide (LASIX) 20 MG tablet Take 20 mg by mouth daily.     Marland Kitchen levothyroxine (SYNTHROID, LEVOTHROID) 50 MCG tablet Take 50 mcg by mouth daily before breakfast.    . omeprazole (PRILOSEC) 20 MG capsule Take 20 mg by mouth daily.      Marland Kitchen oxyCODONE (OXY IR/ROXICODONE) 5 MG immediate release tablet Take 1 tablet (5 mg total) by mouth every 4 (four) hours as needed for moderate pain. 30 tablet 0  . potassium chloride (MICRO-K) 10 MEQ CR capsule TAKE 1 TABLET BY MOUTH EVERY DAY 15 capsule 0  . Simethicone (PHAZYME MAXIMUM STRENGTH) 250 MG CAPS Take 250 mg by mouth 2 (two) times daily as needed (for gas/flatulence).    . simvastatin (ZOCOR) 40 MG tablet Take 1 tablet (40 mg total) by mouth at bedtime.  15 tablet 0  . triazolam (HALCION) 0.25 MG tablet Take 0.25 mg by mouth at bedtime.    Marland Kitchen warfarin (COUMADIN) 3 MG tablet TAKE 1 TABLET (3 MG TOTAL) BY MOUTH AS DIRECTED. (Patient taking differently: Take 3-4.5 mg by mouth See admin instructions. Take 1 1/2 tablets (4.5 mg) by mouth on Friday evening (between 7pm and 7:30pm), take 1 tablet (3 mg) on all other evenings of the week (Sat thru Thurs)) 40 tablet 3     . sodium chloride  10 mL/hr Intravenous Once  . sodium chloride   Intravenous Once  . docusate sodium  100 mg Oral BID  . levothyroxine  50 mcg Oral QAC breakfast  . mouth rinse  15 mL Mouth Rinse BID  . pantoprazole  40 mg Oral Daily    Infusions: . sodium chloride 75 mL/hr at 05/25/16 2130    No Known Allergies  Social History   Social History  . Marital status: Married    Spouse name: N/A  . Number of children: 1  . Years of education: N/A    Occupational History  . homemaker    Social History Main Topics  . Smoking status: Former Smoker    Packs/day: 1.00    Years: 25.00    Types: Cigarettes    Quit date: 07/15/2004  . Smokeless tobacco: Never Used  . Alcohol use No  . Drug use: No  . Sexual activity: No   Other Topics Concern  . Not on file   Social History Narrative   She lives in [place] with her husband and son. She previously smoked 37-pack-years,but quit following her stroke in April of 2006. She denies any alcohol or drugs. She has not been routinely exercising.    Family History  Problem Relation Age of Onset  . Stroke Mother   . Heart disease Mother   . Alcohol abuse Father   . Hypertension Father   . Stroke Father   . Hyperlipidemia Father   . Stroke Brother     she has three and at least one of them has had a stroke  . Colon cancer Neg Hx     REVIEW OF SYSTEMS:  All systems reviewed  Negative to the above problem except as noted above.    PHYSICAL EXAM: Vitals:   05/25/16 1930 05/25/16 2130  BP: (!) 92/42 (!) 86/35  Pulse: (!) 117 (!) 121  Resp: (!) 22 (!) 31  Temp:  97.6 F (36.4 C)     Intake/Output Summary (Last 24 hours) at 05/25/16 2151 Last data filed at 05/25/16 2139  Gross per 24 hour  Intake             2586 ml  Output              375 ml  Net             2211 ml   I/O  + 3.8 L    General:  Pt in mild distress, SOB with talking  Using NRB   HEENT: normal Neck: supple. Full  Carotids 2+ bilat; Cor: PMI nondisplaced. Regular rate & rhythm. No rubs, gallops or murmurs. Lungs: MIld central wheeze  Moving air well  No rales anteriorly   Abdomen: Distended  Did not compress.  + BS Extremities: no cyanosis, clubbing, rash, edema Neuro: alert & oriented x 3, cranial nerves grossly intact. moves all 4 extremities w/o difficulty. Affect pleasant.  ECG:  11/12 STvs Ectopic atrial tachycardia  112 bpm  Sl ST depression in the anterolateral leads and II, AVF  11/13  ST 125   Increased ST depression in leads I, II, AVL, V4 to V6    Results for orders placed or performed during the hospital encounter of 05/24/16 (from the past 24 hour(s))  CBC     Status: Abnormal   Collection Time: 05/25/16 12:20 AM  Result Value Ref Range   WBC 17.0 (H) 4.0 - 10.5 K/uL   RBC 3.13 (L) 3.87 - 5.11 MIL/uL   Hemoglobin 9.2 (L) 12.0 - 15.0 g/dL   HCT 27.2 (L) 36.0 - 46.0 %   MCV 86.9 78.0 - 100.0 fL   MCH 29.4 26.0 - 34.0 pg   MCHC 33.8 30.0 - 36.0 g/dL   RDW 14.9 11.5 - 15.5 %   Platelets 299 150 - 400 K/uL  MRSA PCR Screening     Status: None   Collection Time: 05/25/16  5:56 AM  Result Value Ref Range   MRSA by PCR NEGATIVE NEGATIVE  Basic metabolic panel     Status: Abnormal   Collection Time: 05/25/16  7:20 AM  Result Value Ref Range   Sodium 139 135 - 145 mmol/L   Potassium 5.3 (H) 3.5 - 5.1 mmol/L   Chloride 108 101 - 111 mmol/L   CO2 23 22 - 32 mmol/L   Glucose, Bld 135 (H) 65 - 99 mg/dL   BUN 21 (H) 6 - 20 mg/dL   Creatinine, Ser 1.76 (H) 0.44 - 1.00 mg/dL   Calcium 8.3 (L) 8.9 - 10.3 mg/dL   GFR calc non Af Amer 31 (L) >60 mL/min   GFR calc Af Amer 35 (L) >60 mL/min   Anion gap 8 5 - 15  Protime-INR     Status: Abnormal   Collection Time: 05/25/16  7:20 AM  Result Value Ref Range   Prothrombin Time 23.4 (H) 11.4 - 15.2 seconds   INR 2.04   APTT     Status: Abnormal   Collection Time: 05/25/16  7:20 AM  Result Value Ref Range   aPTT 41 (H) 24 - 36 seconds  CBC     Status: Abnormal   Collection Time: 05/25/16  7:20 AM  Result Value Ref Range   WBC 17.5 (H) 4.0 - 10.5 K/uL   RBC 2.68 (L) 3.87 - 5.11 MIL/uL   Hemoglobin 7.9 (L) 12.0 - 15.0 g/dL   HCT 23.4 (L) 36.0 - 46.0 %   MCV 87.3 78.0 - 100.0 fL   MCH 29.5 26.0 - 34.0 pg   MCHC 33.8 30.0 - 36.0 g/dL   RDW 14.8 11.5 - 15.5 %   Platelets 294 150 - 400 K/uL  BLOOD TRANSFUSION REPORT - SCANNED     Status: None   Collection Time: 05/25/16 11:22 AM   Narrative   Ordered by an unspecified provider.    CBC     Status: Abnormal   Collection Time: 05/25/16 12:16 PM  Result Value Ref Range   WBC 21.3 (H) 4.0 - 10.5 K/uL   RBC 2.53 (L) 3.87 - 5.11 MIL/uL   Hemoglobin 7.5 (L) 12.0 - 15.0 g/dL   HCT 22.2 (L) 36.0 - 46.0 %   MCV 87.7 78.0 - 100.0 fL   MCH 29.6 26.0 - 34.0 pg   MCHC 33.8 30.0 - 36.0 g/dL   RDW 15.4 11.5 - 15.5 %   Platelets 356 150 - 400 K/uL  Type and screen [place]     Status:  None (Preliminary result)   Collection Time: 05/25/16  2:29 PM  Result Value Ref Range   ABO/RH(D) AB POS    Antibody Screen NEG    Sample Expiration 05/28/2016    Unit Number [id number]    Blood Component Type RED CELLS,LR    Unit division 00    Status of Unit ALLOCATED    Transfusion Status OK TO TRANSFUSE    Crossmatch Result Compatible    Unit Number [id number]    Blood Component Type RED CELLS,LR    Unit division 00    Status of Unit ALLOCATED    Transfusion Status OK TO TRANSFUSE    Crossmatch Result Compatible   Troponin I (q 6hr x 3)     Status: Abnormal   Collection Time: 05/25/16  2:29 PM  Result Value Ref Range   Troponin I 0.04 (HH) <0.03 ng/mL  CK total and CKMB (cardiac)not at [facility]     Status: Abnormal   Collection Time: 05/25/16  2:29 PM  Result Value Ref Range   Total CK 54 38 - 234 U/L   CK, MB 15.0 (H) 0.5 - 5.0 ng/mL   Relative Index RELATIVE INDEX IS INVALID 0.0 - 2.5  ABO/Rh     Status: None   Collection Time: 05/25/16  2:29 PM  Result Value Ref Range   ABO/RH(D) AB POS   Troponin I (q 6hr x 3)     Status: Abnormal   Collection Time: 05/25/16  6:41 PM  Result Value Ref Range   Troponin I 0.16 (HH) <0.03 ng/mL  Prepare RBC     Status: None   Collection Time: 05/25/16  8:14 PM  Result Value Ref Range   Order Confirmation ORDER PROCESSED BY BLOOD BANK    Ct Abdomen Pelvis W Contrast  Result Date: 05/24/2016 CLINICAL DATA:  Left-sided abdominal pain with bruising and nausea since yesterday. Patient had umbilical hernia surgery  on 05/15/2016. EXAM: CT ABDOMEN AND PELVIS WITH CONTRAST TECHNIQUE: Multidetector CT imaging of the abdomen and pelvis was performed using the standard protocol following bolus administration of intravenous contrast. CONTRAST:  71mL ISOVUE-300 IOPAMIDOL (ISOVUE-300) INJECTION 61% COMPARISON:  06/28/2015 FINDINGS: Lower chest: Subsegmental atelectasis both lung bases. The heart is enlarged. Patient is status post CABG. Hepatobiliary: No focal abnormality within the liver parenchyma. Gallbladder surgically absent. Stable mild prominence extrahepatic bile ducts. Pancreas: No focal mass lesion. No dilatation of the main duct. No intraparenchymal cyst. No peripancreatic edema. Spleen: No splenomegaly. No focal mass lesion. Adrenals/Urinary Tract: No adrenal nodule or mass. Kidneys are unremarkable. No evidence for hydroureter. The urinary bladder appears normal for the degree of distention. Stomach/Bowel: Moderate hiatal hernia noted. Stomach otherwise unremarkable. Duodenum is normally positioned as is the ligament of Treitz. No small bowel wall thickening. No small bowel dilatation. The terminal ileum is normal. The appendix is normal. No gross colonic mass. No colonic wall thickening. No substantial diverticular change. Vascular/Lymphatic: There is abdominal aortic atherosclerosis without aneurysm. There is no gastrohepatic or hepatoduodenal ligament lymphadenopathy. No intraperitoneal or retroperitoneal lymphadenopathy. No pelvic sidewall lymphadenopathy. Reproductive: The uterus has normal CT imaging appearance. There is no adnexal mass. Other: Small volume free fluid seen around the liver, in both para colic gutters, and in the anatomic pelvis. Fluid around the liver averages 34 Hounsfield units, consistent with blood. Musculoskeletal: Complex multiloculated anterior abdominal wall hematoma is identified in the abdomen and pelvis. - 3 x 6 x 8 cm collection of blood, fluid, and air is identified in the subcutaneous  fat just to the left of the umbilicus. - 7 x 8 x 9 cm hematoma is identified in the midline abdomen. This is probably extraperitoneal given the focal nature of the hematoma, but loculated within the omentum cannot be entirely excluded. This hematoma appears to communicate with the hematoma described immediately below. - 6 x 9 x 11 cm hematoma is identified in the left rectus sheath and this appears to communicate with the hematoma listed below. - 8 x 10 x 9 cm hematoma in the space of Retzius Multiple areas of gas and fluid are seen in the subcutaneous fat of the anterior abdominal wall. Many of these were seen on the preoperative study from 06/28/2015 and may represent injection sites. Bone windows reveal no worrisome lytic or sclerotic osseous lesions. IMPRESSION: 1. Multiloculated large hematoma of the anterior abdominal wall extends from the umbilicus down to the symphysis pubis. A Component contained in the left rectus sheath extends down into the space of Retzius. There appears to be communication between the rectus sheath hematoma and a midline hematoma which is probably extraperitoneal anteriorly between the transversalis fascia and the peritoneal lining given the focal appearance although containment within the omentum/gastrocolic ligament is possible but considered less likely. There is also a superficial component of the hematoma seen anterior to the rectus fascia. 2. Small volume hemoperitoneum. 3. Moderate hiatal hernia. Electronically Signed   By: Misty Stanley M.D.   On: 05/24/2016 14:22     ASSESSMENT: 60 yo with history of CAD and AV dz   (s/p CABG and AVR in 2006)  She is s/p hernia repair  Now with large hematomas after lovenox injection.   Hgb 7.5  Pt complains of SOB  Has received over 3 L of fluid so far  Now getting blood   ON exam, BP 80s to 90s/  HR 110s  Sats 100% on NRB  PT with SOB   Abdomen is distended with bruising    EKG with evid of subendocardial ischemia/injury.     Agree with Transfusion  Watch BP  Would give some lasix as needed and as BP tolerates   COntinue to follow Hgb and troponin     CAD  As above  Follow troponin    AV dz  S/p AVR  Mean gradient 30 mm HG on preop echo  Follow  No anticoag forn now.

## 2016-05-25 NOTE — Care Management Note (Signed)
Case Management Note  Patient Details  Name: Elizabeth Morton MRN: [id number] Date of Birth: January 16, 1956  Subjective/Objective:    9 days s/p uncomplicated lap-assisted ventral hernia repair, now with multiple abdominal wall hematomas  and a concomitant 5-pt drop in hemoglobin. She is s/p 2 units of FFP and PRBC's,  She lives with her husband at home, pta she was indep, she has a PCP, she has medication coverage and she has transportation at [employer].  She states she has had [place] services in the past with [facility] and If she needs [facility] services would like to have [place] again.  Patient is for transfer to [facility] today.  NCM will cont to follow for dc needs.               Action/Plan:   Expected Discharge Date:  05/29/16               Expected Discharge Plan:  Home/Self Care  In-House Referral:     Discharge planning Services  CM Consult  Post Acute Care Choice:    Choice offered to:     DME Arranged:    DME Agency:     HH Arranged:    HH Agency:     Status of Service:  Completed, signed off  If discussed at [employer] of Stay Meetings, dates discussed:    Additional Comments:  Zenon Mayo, RN 05/25/2016, 10:48 AM

## 2016-05-25 NOTE — Progress Notes (Signed)
Patient being transferred to [facility] this afternoon. Report called to Bri at [facility]. [facility] transportation arranged.  Joellen Jersey, RN.

## 2016-05-25 NOTE — Progress Notes (Signed)
Dr. Hassell Done made aware of elevated Troponin and CKMB levels.

## 2016-05-25 NOTE — Progress Notes (Addendum)
Patient ID: Elizabeth Morton, female   DOB: [birth year], 60 y.o.   MRN: [id number] Notified of arrival by RN. D/W Dr/ Zella Richer. SBP 89, lungs with B wheeze. Abd soft, lower abd wall hematoma with evolving ecchymosis.  TF 2u PRBC going in now. Schedule bronchodilators. Dr. Harrington Challenger from cardiology is at the bedside - appreciate ongoing cardiology evaluation. I spoke with her husband.  Elizabeth Skeans, MD, MPH, FACS Trauma: [phone] General Surgery: [phone]

## 2016-05-25 NOTE — Progress Notes (Signed)
Pt HR increased to the 120s, increase on oxygen requirements to 6L [place]. Dr. Zella Richer at bedside.  EKG performed. STAT transfer to [facility] ordered.  Report given to [facility] RN and [employer]. Per pt request, husband called and notified of transfer.  Patient alert and oriented throughout.  Left with [facility] at 2050.

## 2016-05-25 NOTE — Progress Notes (Addendum)
General Surgery:  Received 2 units FFP and 2 units PRBC yesterday. Alert.  Heart rate 100.  BP 111/51.  Good urine output. She feels a little better but still has lower abdominal pain.  Upper abdomen generally soft.  Left lower quadrant with palpable hematoma and ecchymoses obviously tender.  Trocar sites intact.  Active bowel sounds.  Morning lab work pending Hemoglobin went from 7.5-10.6-9.2 at midnight. INR 2.92 at 9 PM last night Creatinine 1.6 at 9 PM last night.  Potassium 4.4.  Assessment: Left rectus sheath hematoma, obviously related to Coumadin and recent surgery Acute blood loss anemia Hopefully this will stabilize and resolve spontaneously without surgical intervention Check INR and CBC now  Plan: Care plan discussed with Dr. Johnathan Hausen.  He requests transfer to [facility] stepdown unit and he will assume her care this afternoon.   Comorbidities: History aortic valvular placement 2006 CABG 2006 History CVA related to thrombosed aortic root aneurysm Hypertension Anticoagulated on Coumadin   Annlee Glandon M. Dalbert Batman, M.D., [facility] General and Minimally invasive Surgery Breast and Colorectal Surgery Office:   [phone]

## 2016-05-25 NOTE — Progress Notes (Signed)
Patient seen and examined.  She is diaphoretic, tachycardic, and having episodes of hypotension.  Troponin is rising and hemoglobin is 7.5.  Spoke with Dr. Debara Pickett who requests patient to be transported to [facility] ASAP and also suggests giving her 2 units of PRBCs.  Both are being arranged.

## 2016-05-26 ENCOUNTER — Inpatient Hospital Stay (HOSPITAL_COMMUNITY): Payer: Commercial Managed Care - HMO

## 2016-05-26 DIAGNOSIS — R6521 Severe sepsis with septic shock: Secondary | ICD-10-CM

## 2016-05-26 DIAGNOSIS — J9600 Acute respiratory failure, unspecified whether with hypoxia or hypercapnia: Secondary | ICD-10-CM

## 2016-05-26 DIAGNOSIS — J9601 Acute respiratory failure with hypoxia: Secondary | ICD-10-CM

## 2016-05-26 DIAGNOSIS — S301XXS Contusion of abdominal wall, sequela: Secondary | ICD-10-CM

## 2016-05-26 DIAGNOSIS — I959 Hypotension, unspecified: Secondary | ICD-10-CM

## 2016-05-26 DIAGNOSIS — J810 Acute pulmonary edema: Secondary | ICD-10-CM

## 2016-05-26 DIAGNOSIS — A419 Sepsis, unspecified organism: Secondary | ICD-10-CM

## 2016-05-26 DIAGNOSIS — I5033 Acute on chronic diastolic (congestive) heart failure: Secondary | ICD-10-CM

## 2016-05-26 LAB — PREPARE FRESH FROZEN PLASMA
Unit division: 0
Unit division: 0
Unit division: 0
Unit division: 0

## 2016-05-26 LAB — CBC
HCT: 33.2 % — ABNORMAL LOW (ref 36.0–46.0)
HEMATOCRIT: 30.7 % — AB (ref 36.0–46.0)
HEMOGLOBIN: 10.5 g/dL — AB (ref 12.0–15.0)
Hemoglobin: 11.2 g/dL — ABNORMAL LOW (ref 12.0–15.0)
MCH: 29.6 pg (ref 26.0–34.0)
MCH: 29.8 pg (ref 26.0–34.0)
MCHC: 33.7 g/dL (ref 30.0–36.0)
MCHC: 34.2 g/dL (ref 30.0–36.0)
MCV: 87.6 fL (ref 78.0–100.0)
MCV: 87.2 fL (ref 78.0–100.0)
PLATELETS: 350 10*3/uL (ref 150–400)
Platelets: 314 10*3/uL (ref 150–400)
RBC: 3.79 MIL/uL — ABNORMAL LOW (ref 3.87–5.11)
RBC: 3.52 MIL/uL — ABNORMAL LOW (ref 3.87–5.11)
RDW: 14.8 % (ref 11.5–15.5)
RDW: 15.3 % (ref 11.5–15.5)
WBC: 22.5 10*3/uL — ABNORMAL HIGH (ref 4.0–10.5)
WBC: 20 10*3/uL — ABNORMAL HIGH (ref 4.0–10.5)

## 2016-05-26 LAB — BASIC METABOLIC PANEL
Anion gap: 10 (ref 5–15)
BUN: 24 mg/dL — AB (ref 6–20)
CALCIUM: 8.3 mg/dL — AB (ref 8.9–10.3)
CO2: 22 mmol/L (ref 22–32)
CREATININE: 1.48 mg/dL — AB (ref 0.44–1.00)
Chloride: 107 mmol/L (ref 101–111)
GFR calc Af Amer: 44 mL/min — ABNORMAL LOW (ref >60)
GFR, EST NON AFRICAN AMERICAN: 38 mL/min — AB (ref >60)
GLUCOSE: 137 mg/dL — AB (ref 65–99)
Potassium: 4.6 mmol/L (ref 3.5–5.1)
Sodium: 139 mmol/L (ref 135–145)

## 2016-05-26 LAB — BRAIN NATRIURETIC PEPTIDE: B Natriuretic Peptide: 1856 pg/mL — ABNORMAL HIGH (ref 0.0–100.0)

## 2016-05-26 LAB — CORTISOL

## 2016-05-26 LAB — PROTIME-INR
INR: 2.26
Prothrombin Time: 25.3 seconds — ABNORMAL HIGH (ref 11.4–15.2)

## 2016-05-26 LAB — PROCALCITONIN: PROCALCITONIN: 0.51 ng/mL

## 2016-05-26 MED ORDER — NOREPINEPHRINE BITARTRATE 1 MG/ML IV SOLN
0.0000 ug/min | INTRAVENOUS | Status: DC
  Administered 2016-05-26: 2 ug/min via INTRAVENOUS
  Administered 2016-05-27 – 2016-05-28 (×2): 3 ug/min via INTRAVENOUS
  Filled 2016-05-26 (×3): qty 4

## 2016-05-26 MED ORDER — DEXTROSE 5 % IV SOLN
2.0000 g | Freq: Once | INTRAVENOUS | Status: AC
  Administered 2016-05-26: 2 g via INTRAVENOUS
  Filled 2016-05-26: qty 2

## 2016-05-26 MED ORDER — SODIUM CHLORIDE 0.9 % IV SOLN
1500.0000 mg | Freq: Once | INTRAVENOUS | Status: AC
  Administered 2016-05-26: 1500 mg via INTRAVENOUS
  Filled 2016-05-26: qty 1500

## 2016-05-26 MED ORDER — VANCOMYCIN HCL IN DEXTROSE 1-5 GM/200ML-% IV SOLN
1000.0000 mg | INTRAVENOUS | Status: DC
  Administered 2016-05-27: 1000 mg via INTRAVENOUS
  Filled 2016-05-26 (×2): qty 200

## 2016-05-26 MED ORDER — DEXTROSE 5 % IV SOLN
2.0000 g | INTRAVENOUS | Status: DC
  Administered 2016-05-27: 2 g via INTRAVENOUS
  Filled 2016-05-26 (×2): qty 2

## 2016-05-26 MED ORDER — IPRATROPIUM-ALBUTEROL 0.5-2.5 (3) MG/3ML IN SOLN
3.0000 mL | Freq: Three times a day (TID) | RESPIRATORY_TRACT | Status: DC
  Administered 2016-05-27 – 2016-05-28 (×4): 3 mL via RESPIRATORY_TRACT
  Filled 2016-05-26 (×4): qty 3

## 2016-05-26 MED ORDER — FUROSEMIDE 10 MG/ML IJ SOLN
5.0000 mg/h | INTRAMUSCULAR | Status: DC
  Administered 2016-05-26: 5 mg/h via INTRAVENOUS
  Filled 2016-05-26 (×3): qty 25

## 2016-05-26 MED ORDER — HYDROCORTISONE NA SUCCINATE PF 100 MG IJ SOLR
50.0000 mg | Freq: Four times a day (QID) | INTRAMUSCULAR | Status: DC
  Administered 2016-05-26 – 2016-05-27 (×7): 50 mg via INTRAVENOUS
  Filled 2016-05-26 (×8): qty 2

## 2016-05-26 NOTE — CV Procedure (Addendum)
Central Venous Catheter Insertion Procedure Note KENISHIA ZALDIVAR [id number] [birth year]  Procedure: Insertion of Central Venous Catheter Indications: Assessment of intravascular volume, Drug and/or fluid administration and Frequent blood sampling  Procedure Details Consent: Risks of procedure as well as the alternatives and risks of each were explained to the (patient/caregiver).  Consent for procedure obtained. Time Out: Verified patient identification, verified procedure, site/side was marked, verified correct patient position, special equipment/implants available, medications/allergies/relevent history reviewed, required imaging and test results available.  Performed  Maximum sterile technique was used including antiseptics, cap, gloves, gown, hand hygiene, mask and sheet. Skin prep: Chlorhexidine; local anesthetic administered A antimicrobial bonded/coated triple lumen catheter was placed in the right internal jugular vein using the Seldinger technique.  Evaluation Blood flow good Complications: No apparent complications Patient did tolerate procedure well. Chest X-ray ordered to verify placement.  CXR: pending.  Procedure completed under direct supervision of Dr. Ellin Goodie with ultra sound for real time  Visualization of the vessel.  Magdalen Spatz 05/26/2016, 1:05 PM  Rush Farmer, M.D. [facility] Pulmonary/Critical Care Medicine. Pager: [phone]. After hours pager: [phone].

## 2016-05-26 NOTE — Progress Notes (Signed)
Pharmacy consulted regarding anticoagulation alternatives for a mechanical heart valve replacement. Patient underwent aortic mechanical valve replacement in 2006 and was on warfarin regimen with INR goal 2.0-3.0. There is currently no evidence for use of direct oral anticoagulation agents for this indication. Could consider IV heparin while inpatient. On discharge, could consider a more narrow INR goal (2.0-2.5) for this patient to reduce risk of complications.   Marylou Flesher, PharmD Candidate 05/26/2016, 8:58 AM

## 2016-05-26 NOTE — Consult Note (Signed)
PULMONARY / CRITICAL CARE MEDICINE   Name: Elizabeth Morton MRN: [id number] DOB: June 18, 1956    ADMISSION DATE:  05/24/2016 CONSULTATION DATE:  05/26/2016  REFERRING MD:  CCS  CHIEF COMPLAINT:  Respiratory Distress  HISTORY OF PRESENT ILLNESS:   60yo woman with multiple comorbidities as described below who underwent a lap-assisted ventral hernia repair by Dr. Hassell Done on 11/3. The surgery and her post-op course were uncomplicated. She was discharged on 11/5 on a lovenox bridge and coumadin, which she takes for her mechanical aortic valve. She has had evolving abdominal wall bruising since surgery, but today when she looked in the mirror felt her abdomen to be more swollen, and has had worsening abdominal wall and back pain, particularly on the left abdominal wall. She has nausea and generally felt ill, so presented to the ER for evaluation. She was noted to have a 5-point drop in her hemoglobin since it was last checked 5 days ago. Of note she has still been taking her lovenox shots up to last night. A CT scan reveals multiple abdominal wall hematomas. Left rectus sheath hematoma, related to anticoagulation  and recent surgery resulting in  acute blood loss anemia.  PAST MEDICAL HISTORY :  She  has a past medical history of Aortic stenosis; CAD (coronary artery disease); CVA (cerebral infarction); Dyslipidemia; Gallstones; GERD (gastroesophageal reflux disease); Heart murmur; CABG; transfusion of packed red blood cells; Hypertension; Hypothyroidism; Iron deficiency anemia; Lower GI bleed (06/2015); Pericardial effusion; Peripheral vascular disease ([place]) (06); S/P aortic valve replacement; Status post colonoscopy with polypectomy; Stroke [facility]) (2006); and Warfarin anticoagulation.  PAST SURGICAL HISTORY: She  has a past surgical history that includes Coronary artery bypass graft (2006); Aortic valve replacement (2006); Tonsillectomy; Lumbar fusion (~ 1973); Laparoscopic cholecystectomy single port  (07/21/2012); Cardiac valve replacement; Back surgery; Cesarean section (1985); Cardiac catheterization (~ 2006); Colonoscopy w/ biopsies and polypectomy (06/18/2015); Esophagogastroduodenoscopy (06/18/2015); Colonoscopy (N/A, 06/26/2015); Laparoscopic assisted ventral hernia repair (N/A, 05/15/2016); and Insertion of mesh (05/15/2016).  No Known Allergies  No current facility-administered medications on file prior to encounter.    Current Outpatient Prescriptions on File Prior to Encounter  Medication Sig  . acetaminophen (TYLENOL) 500 MG tablet Take 500-1,000 mg by mouth every 6 (six) hours as needed (for pain).  . Calcium Carbonate-Vitamin D (CALCIUM 600+D) 600-400 MG-UNIT per tablet Take 1 tablet by mouth 2 (two) times daily.  Marland Kitchen enoxaparin (LOVENOX) 80 MG/0.8ML injection Inject 0.8 mLs (80 mg total) into the skin every 12 (twelve) hours.  . furosemide (LASIX) 20 MG tablet Take 20 mg by mouth daily.   Marland Kitchen levothyroxine (SYNTHROID, LEVOTHROID) 50 MCG tablet Take 50 mcg by mouth daily before breakfast.  . omeprazole (PRILOSEC) 20 MG capsule Take 20 mg by mouth daily.    Marland Kitchen oxyCODONE (OXY IR/ROXICODONE) 5 MG immediate release tablet Take 1 tablet (5 mg total) by mouth every 4 (four) hours as needed for moderate pain.  . potassium chloride (MICRO-K) 10 MEQ CR capsule TAKE 1 TABLET BY MOUTH EVERY DAY  . Simethicone (PHAZYME MAXIMUM STRENGTH) 250 MG CAPS Take 250 mg by mouth 2 (two) times daily as needed (for gas/flatulence).  . simvastatin (ZOCOR) 40 MG tablet Take 1 tablet (40 mg total) by mouth at bedtime.  . triazolam (HALCION) 0.25 MG tablet Take 0.25 mg by mouth at bedtime.  Marland Kitchen warfarin (COUMADIN) 3 MG tablet TAKE 1 TABLET (3 MG TOTAL) BY MOUTH AS DIRECTED. (Patient taking differently: Take 3-4.5 mg by mouth See admin instructions. Take 1 1/2  tablets (4.5 mg) by mouth on Friday evening (between 7pm and 7:30pm), take 1 tablet (3 mg) on all other evenings of the week (Sat thru Thurs))  . [DISCONTINUED]  KLOR-CON M10 10 MEQ tablet TAKE 1 TABLET BY MOUTH EVERY DAY    FAMILY HISTORY:  Her indicated that her mother is deceased. She indicated that her father is deceased. She indicated that the status of her brother is unknown. She indicated that the status of her neg hx is unknown.    SOCIAL HISTORY: She  reports that she quit smoking about 11 years ago. Her smoking use included Cigarettes. She has a 25.00 pack-year smoking history. She has never used smokeless tobacco. She reports that she does not drink alcohol or use drugs.  REVIEW OF SYSTEMS:    Constitutional:   No  weight loss, night sweats,  Fevers, chills, fatigue, or  lassitude.  HEENT:   No headaches,  Difficulty swallowing,  Tooth/dental problems, or  Sore throat,                No sneezing, itching, ear ache, nasal congestion, post nasal drip,   CV:  No chest pain,  Orthopnea, PND, swelling in lower extremities, anasarca, dizziness, palpitations, syncope.   GI  No heartburn, indigestion, + abdominal / surgical  pain, + nausea, no vomiting, diarrhea, change in bowel habits,+ loss of appetite, no bloody stools.   Resp: + shortness of breath with conversation none at rest.  No excess mucus, no productive cough,  No non-productive cough,  No coughing up of blood.  No change in color of mucus.  No wheezing.  No chest wall deformity  Skin: no rash or lesions, bruising.  GU: no dysuria, change in color of urine, no urgency or frequency.  No flank pain, no hematuria   MS:  No joint pain or swelling.  No decreased range of motion.  No back pain.  Psych:  No change in mood or affect. No depression or anxiety.  No memory loss.  SUBJECTIVE:  Pt. States she feels better since breathing treatment  VITAL SIGNS: BP 97/67   Pulse (!) 102   Temp 98.1 F (36.7 C)   Resp 20   Ht 5\' 2"  (1.575 m)   Wt 170 lb (77.1 kg)   SpO2 98%   BMI 31.09 kg/m   HEMODYNAMICS:    VENTILATOR SETTINGS:    INTAKE / OUTPUT: I/O last 3 completed  shifts: In: 3785.6 [P.O.:60; I.V.:2271.3; Blood:1454.3] Out: 1835 [id number]  PHYSICAL EXAMINATION: General: Mild respiratory distress, SOB with conversation, 6L [place] Neuro:  A&O x 3, cranial nerves grossly  Intact, MAE x 4 without difficulty HEENT: Normal Cardiovascular: PMI non-displaced, regular rate and rhythm, no rubs, gallops or + murmur , Full carotids Lungs: Mild exp wheeze, moving air well, rales per bases, rate 22   Abdomen: Distended, firm,tender to touch, BS present, but quiet.Bruising Extremities: no cyanosis, clubbing, rash or edema Skin:Intact with exception of abdominal incision/ steri strips, bruising  LABS:  BMET  Recent Labs Lab 05/24/16 2111 05/25/16 0720 05/26/16 0547  NA 136 139 139  K 4.4 5.3* 4.6  CL 106 108 107  CO2 18* 23 22  BUN 16 21* 24*  CREATININE 1.60* 1.76* 1.48*  GLUCOSE 197* 135* 137*    Electrolytes  Recent Labs Lab 05/24/16 2111 05/25/16 0720 05/26/16 0547  CALCIUM 8.1* 8.3* 8.3*    CBC  Recent Labs Lab 05/25/16 0720 05/25/16 1216 05/26/16 0547  WBC 17.5* 21.3* 22.5*  HGB 7.9* 7.5* 11.2*  HCT 23.4* 22.2* 33.2*  PLT 294 356 350    Coag's  Recent Labs Lab 05/24/16 1251 05/24/16 2111 05/25/16 0720 05/26/16 0745  APTT 49* 48* 41*  --   INR 2.96 2.92 2.04 2.26    Sepsis Markers  Recent Labs Lab 05/24/16 1318  LATICACIDVEN 3.31*    ABG No results for input(s): PHART, PCO2ART, PO2ART in the last 168 hours.  Liver Enzymes  Recent Labs Lab 05/24/16 1251  AST 44*  ALT 16  ALKPHOS 74  BILITOT 1.1  ALBUMIN 3.1*    Cardiac Enzymes  Recent Labs Lab 05/25/16 1429 05/25/16 1841  TROPONINI 0.04* 0.16*    Glucose No results for input(s): GLUCAP in the last 168 hours.  Imaging Dg Abd Portable 1v  Result Date: 05/26/2016 CLINICAL DATA:  Abdominal distention EXAM: PORTABLE ABDOMEN - 1 VIEW COMPARISON:  CT from 2 days ago FINDINGS: Hazy density in the central abdomen with paucity of bowel loops  correlating with large abdominal wall and extraperitoneal hematoma. No evidence of bowel obstruction. Cholecystectomy clips. IMPRESSION: Mass effect from known abdominal wall and extraperitoneal hematoma. Normal bowel gas pattern. Electronically Signed   By: Monte Fantasia M.D.   On: 05/26/2016 07:47     STUDIES:  CT Abdomen/Pelvis 05/24/2016:   Multiloculated large hematoma of the anterior abdominal wall extends from the umbilicus down to the symphysis pubis. A Component contained in the left rectus sheath extends down into the space of Retzius. There appears to be communication between the rectus sheath hematoma and a midline hematoma which is probably extraperitoneal anteriorly between the transversalis fascia and the peritoneal lining given the focal appearance although containment within the omentum/gastrocolic ligament is possible but considered less likely. There is also a superficial component of the hematoma seen anterior to the rectus fascia.  Small volume hemoperitoneum.  Moderate hiatal hernia.   Echo on Oc 27 LVEF 60 to 65% with mean gradient across AV prostheiss of 30 mm Hg (unchanged from 2016),    CULTURES: Blood 11/14>> Urine 11/14>>  ANTIBIOTICS: None:  SIGNIFICANT EVENTS: 11/3>> Lap Assisted ventral hernia discharged home 11/5 on Lovenox/ Coumadin 11/12>> Re-admitted for multiple abdominal wall hematomas and blood loss anemia ( HGB 7.0) 11/12>> 2 Units PRBC and 2 Units FFP 11/14>> Worsening SOB  LINES/TUBES: PIV  DISCUSSION: Pt. Admitted 11/12 with development of hematomas post op lap assisted ventral hernia repair ( 11/3), with blood loss anemia to HGB of 7.0. In patient with mechanical aortic valve and chronic anticoagulation. CCM consulted 11/14 for increased respiratory distress. CXR revealed CHF with pulmonary interstitial and mild alveolar edema. Fluid balance is + 3 Liters. No definite pneumonia, but leukocytosis. Blood Pressure is soft, but needs  diuresis. No central access for pressors if needed. Currently saturating 92% on 6 L [place]. Abdominal distension is contributing to decreased lung volumes, and tachypnea.CCM consulted for evaluation for intubation, MV if indicated.Pt. Currently not receiving anti coagulation, INR remains>2.0 11/14. Will need to address as pt. With mechanical aortic valve and history of ischemic stroke in 2006.     ASSESSMENT / PLAN:  PULMONARY A: Concern for Hypoxic Respiratory Failure Desaturations on 6L [place] Pulmonary interstitial and mild alveolar edema  P:   Needs diuresis as blood pressure allows CXR daily Continue scheduled DuoNebs every 6 hours. Maintain saturations > 92% IS Pulmonary Toilet   CARDIOVASCULAR A:  EKG with subendocardial ischemic injury ( ? Ischemic) Mechanical Aortic Valve / chronic coumadin ( INR goal 2-3) AVR  Mean gradient 30 mm HG on preop echo  Hypotension post op  EF 60-65% 04/2016  P:  Transfuse for HGB <7 Maintain MAP > 65 Pressors as needed Will need CVC or PICC is pressor needs Trend Troponin Daily Check BNP Consider Repeat Echo  RENAL A:   Mildly elevated Creatinine  P:  BMET daily Monitor output  Avoid nephrotoxic medications  GASTROINTESTINAL A:   Large anterior wall hematoma  Distended, bruised  and tender to mild palpation Nausea P:  NPO  Protonix daily as prophylaxis Zofran for nausea prn Colace scheduled   HEMATOLOGIC A:   Blood Loss anemia post hernia repair in setting of chronic anti coagulation ( HGB drop to 7) No obvious signs of bleeding Anti coagulation on hold  INR 2.26 P:  CBC daily Transfuse for HGB < 7 INR daily     INFECTIOUS A:   Leukocytosis Afebrile P:   Blood Cultures x 2 Urine Culture Follow Cultures Trend WBC daily ABX if cultures +   ENDOCRINE A:    No Acute Issues P:   CBG's q 4   NEUROLOGIC A:   Alert and appropriate  P:   Sedation for abdominal pain as blood pressure tolerates. Rass  Goal 1-2    FAMILY  - Updates:   - Inter-disciplinary family meet or Palliative Care meeting due by:  05/31/2016 .   Magdalen Spatz, AGACNP-BC [facility]Critical [facility] Pager # [phone]  05/26/2016, 10:07 AM  Attending Note:  60 year old female with extensive cardiac history who has an artificial valve and is on anticoagulation.  Had a procedure for hernia repair now presenting with rectus sheath hematoma and multiple hematomas throughout the abdomen.  PCCM was consulted for hypotension.  Patient appears fluid overloaded but BP is too low for active diureses.  Unsure if this is shock due to cardiac or septic in nature.  Will pan culture, check echo, place TLC, start levophed, lasix drip at 5 mg/hr IV, zaroxolyn 5 mg PO x1, will need hematomas addressed but will defer that to surgery, check PCT, WBC is 22.5 but afebrile, will start broad spectrum abx and if PCT is negative will d/c in AM.  Hope is that her respiratory condition will improve with diureses.  Hold further IVF and check CVP.  The patient is critically ill with multiple organ systems failure and requires high complexity decision making for assessment and support, frequent evaluation and titration of therapies, application of advanced monitoring technologies and extensive interpretation of multiple databases.   Critical Care Time devoted to patient care services described in this note is  35  Minutes. This time reflects time of care of this signee Dr Jennet Maduro. This critical care time does not reflect procedure time, or teaching time or supervisory time of PA/NP/Med student/Med Resident etc but could involve care discussion time.  Rush Farmer, M.D. [facility] Pulmonary/Critical Care Medicine. Pager: [phone]. After hours pager: [phone].

## 2016-05-26 NOTE — Progress Notes (Signed)
Patient Name: Elizabeth Morton Date of Encounter: 05/26/2016  Primary Cardiologist: [facility] Problem List     Principal Problem:   Acute on chronic diastolic congestive heart failure [facility]) Active Problems:   Warfarin anticoagulation   S/P aortic valve replacement   Acute blood loss anemia   S/P repair of ventral hernia Nov 2017   Rectus sheath hematoma   Hypotension   Respiratory failure (HCC)     Subjective   4/10 abdominal pain; mild dyspnea at rest and possible orthopnea (feels worse after lying flat for placement of central line).  Inpatient Medications    Scheduled Meds: . sodium chloride  10 mL/hr Intravenous Once  . ceFEPime (MAXIPIME) IV  2 g Intravenous Once  . [START ON 05/27/2016] ceFEPime (MAXIPIME) IV  2 g Intravenous Q24H  . docusate sodium  100 mg Oral BID  . hydrocortisone sodium succinate  50 mg Intravenous Q6H  . ipratropium-albuterol  3 mL Nebulization Q6H  . levothyroxine  50 mcg Oral QAC breakfast  . mouth rinse  15 mL Mouth Rinse BID  . pantoprazole  40 mg Oral Daily  . vancomycin  1,500 mg Intravenous Once  . [START ON 05/27/2016] vancomycin  1,000 mg Intravenous Q24H   Continuous Infusions: . sodium chloride 10 mL/hr at 05/25/16 2245  . furosemide (LASIX) infusion    . norepinephrine (LEVOPHED) Adult infusion     PRN Meds: acetaminophen, HYDROmorphone (DILAUDID) injection, ondansetron **OR** ondansetron (ZOFRAN) IV   Vital Signs    Vitals:   05/26/16 0640 05/26/16 0800 05/26/16 0907 05/26/16 1200  BP: (!) 97/55 97/67  91/72  Pulse: 99 (!) 105 (!) 102 (!) 102  Resp: (!) 22 18 20  (!) 28  Temp:  98.1 F (36.7 C)    TempSrc:      SpO2: 97% 96% 98% 94%  Weight:      Height:        Intake/Output Summary (Last 24 hours) at 05/26/16 1335 Last data filed at 05/26/16 1100  Gross per 24 hour  Intake          1549.58 ml  Output             1735 ml  Net          -185.42 ml   Filed Weights   05/24/16 1256  Weight: 170 lb  (77.1 kg)    Physical Exam   GEN: Well nourished, well developed, in mild distress. Tachypneic  HEENT: Grossly normal.  Neck: Supple, no JVD, carotid bruits, or masses. Cardiac: RRR with crisp prosthetic clicks, no murmurs, rubs, or gallops. No clubbing, cyanosis, edema.  Radials/DP/PT 2+ and equal bilaterally.  Respiratory:  Respirations regular and unlabored, clear to auscultation bilaterally. GI: large ecchymoses, especially LLQ, distended, very weak BS + x 4. MS: no deformity or atrophy. Skin: warm and dry, no rash. Neuro:  Strength and sensation are intact. Psych: AAOx3.  Normal affect.  Labs    CBC  Recent Labs  05/24/16 1251  05/26/16 0547 05/26/16 1108  WBC 14.3*  < > 22.5* 20.0*  NEUTROABS 12.0*  --   --   --   HGB 7.0*  < > 11.2* 10.5*  HCT 22.3*  < > 33.2* 30.7*  MCV 86.4  < > 87.6 87.2  PLT 449*  < > 350 314  < > = values in this interval not displayed. Basic Metabolic Panel  Recent Labs  05/25/16 0720 05/26/16 0547  NA 139 139  K 5.3*  4.6  CL 108 107  CO2 23 22  GLUCOSE 135* 137*  BUN 21* 24*  CREATININE 1.76* 1.48*  CALCIUM 8.3* 8.3*   Liver Function Tests  Recent Labs  05/24/16 1251  AST 44*  ALT 16  ALKPHOS 74  BILITOT 1.1  PROT 5.9*  ALBUMIN 3.1*   No results for input(s): LIPASE, AMYLASE in the last 72 hours. Cardiac Enzymes  Recent Labs  05/25/16 1429 05/25/16 1841  CKTOTAL 54  --   CKMB 15.0*  --   TROPONINI 0.04* 0.16*     Telemetry    NSR/STach - Personally Reviewed  ECG    Stachy, diffuse ST depression, chronic - Personally Reviewed  Radiology    Ct Abdomen Pelvis W Contrast  Result Date: 05/24/2016 CLINICAL DATA:  Left-sided abdominal pain with bruising and nausea since yesterday. Patient had umbilical hernia surgery on 05/15/2016. EXAM: CT ABDOMEN AND PELVIS WITH CONTRAST TECHNIQUE: Multidetector CT imaging of the abdomen and pelvis was performed using the standard protocol following bolus administration of  intravenous contrast. CONTRAST:  56mL ISOVUE-300 IOPAMIDOL (ISOVUE-300) INJECTION 61% COMPARISON:  06/28/2015 FINDINGS: Lower chest: Subsegmental atelectasis both lung bases. The heart is enlarged. Patient is status post CABG. Hepatobiliary: No focal abnormality within the liver parenchyma. Gallbladder surgically absent. Stable mild prominence extrahepatic bile ducts. Pancreas: No focal mass lesion. No dilatation of the main duct. No intraparenchymal cyst. No peripancreatic edema. Spleen: No splenomegaly. No focal mass lesion. Adrenals/Urinary Tract: No adrenal nodule or mass. Kidneys are unremarkable. No evidence for hydroureter. The urinary bladder appears normal for the degree of distention. Stomach/Bowel: Moderate hiatal hernia noted. Stomach otherwise unremarkable. Duodenum is normally positioned as is the ligament of Treitz. No small bowel wall thickening. No small bowel dilatation. The terminal ileum is normal. The appendix is normal. No gross colonic mass. No colonic wall thickening. No substantial diverticular change. Vascular/Lymphatic: There is abdominal aortic atherosclerosis without aneurysm. There is no gastrohepatic or hepatoduodenal ligament lymphadenopathy. No intraperitoneal or retroperitoneal lymphadenopathy. No pelvic sidewall lymphadenopathy. Reproductive: The uterus has normal CT imaging appearance. There is no adnexal mass. Other: Small volume free fluid seen around the liver, in both para colic gutters, and in the anatomic pelvis. Fluid around the liver averages 34 Hounsfield units, consistent with blood. Musculoskeletal: Complex multiloculated anterior abdominal wall hematoma is identified in the abdomen and pelvis. - 3 x 6 x 8 cm collection of blood, fluid, and air is identified in the subcutaneous fat just to the left of the umbilicus. - 7 x 8 x 9 cm hematoma is identified in the midline abdomen. This is probably extraperitoneal given the focal nature of the hematoma, but loculated within  the omentum cannot be entirely excluded. This hematoma appears to communicate with the hematoma described immediately below. - 6 x 9 x 11 cm hematoma is identified in the left rectus sheath and this appears to communicate with the hematoma listed below. - 8 x 10 x 9 cm hematoma in the space of Retzius Multiple areas of gas and fluid are seen in the subcutaneous fat of the anterior abdominal wall. Many of these were seen on the preoperative study from 06/28/2015 and may represent injection sites. Bone windows reveal no worrisome lytic or sclerotic osseous lesions. IMPRESSION: 1. Multiloculated large hematoma of the anterior abdominal wall extends from the umbilicus down to the symphysis pubis. A Component contained in the left rectus sheath extends down into the space of Retzius. There appears to be communication between the rectus sheath hematoma  and a midline hematoma which is probably extraperitoneal anteriorly between the transversalis fascia and the peritoneal lining given the focal appearance although containment within the omentum/gastrocolic ligament is possible but considered less likely. There is also a superficial component of the hematoma seen anterior to the rectus fascia. 2. Small volume hemoperitoneum. 3. Moderate hiatal hernia. Electronically Signed   By: Misty Stanley M.D.   On: 05/24/2016 14:22   Dg Chest Port 1 View  Result Date: 05/26/2016 CLINICAL DATA:  Shortness of breath, chest pain. History of coronary artery disease, aortic valve replacement for aortic stenosis, previous CVA, former smoker. EXAM: PORTABLE CHEST 1 VIEW COMPARISON:  PA and lateral chest x-ray of July 15, 2012 FINDINGS: The lungs are hypoinflated. The interstitial markings are diffusely increased. The pulmonary vascularity is engorged. The cardiac silhouette is enlarged. There is partial obscuration of the left hemidiaphragm. The sternal wires are intact. The prosthetic aortic valve ring is in stable position. There is  calcification in the wall of the thoracic aortic arch. The bony thorax is unremarkable. IMPRESSION: CHF with pulmonary interstitial and mild alveolar edema. No definite pneumonia. Thoracic aortic atherosclerosis. Electronically Signed   By: David  Martinique M.D.   On: 05/26/2016 10:18   Dg Abd Portable 1v  Result Date: 05/26/2016 CLINICAL DATA:  Abdominal distention EXAM: PORTABLE ABDOMEN - 1 VIEW COMPARISON:  CT from 2 days ago FINDINGS: Hazy density in the central abdomen with paucity of bowel loops correlating with large abdominal wall and extraperitoneal hematoma. No evidence of bowel obstruction. Cholecystectomy clips. IMPRESSION: Mass effect from known abdominal wall and extraperitoneal hematoma. Normal bowel gas pattern. Electronically Signed   By: Monte Fantasia M.D.   On: 05/26/2016 07:47    Cardiac Studies   ECHO 05/08/16  - Left ventricle: The cavity size was normal. There was mild   concentric hypertrophy. Systolic function was normal. The   estimated ejection fraction was in the range of 60% to 65%. Wall   motion was normal; there were no regional wall motion   abnormalities. Features are consistent with a pseudonormal left   ventricular filling pattern, with concomitant abnormal relaxation   and increased filling pressure (grade 2 diastolic dysfunction). - Ventricular septum: Septal motion showed paradox. - Aortic valve: A mechanical prosthesis was present. Transvalvular   velocity was increased, in the range of moderate stenosis, but   unchanged from the 2016 study. - Left atrium: The atrium was moderately dilated. - Pericardium, extracardiac: A trivial pericardial effusion was   identified.  Patient Profile     60 yo woman with rectus sheath hematoma during enoxaparin-warfarin "bridging" for mechanical AVR following ventral hernia repair, complicated by moderate acute blood loss anemia, mild acute renal insufficiency and acute exacerbation of chronic diastolic heart failure  following volume resuscitation. Background problems include CAD s/p CABG and moderately elevated AV prosthesis gradients.  Assessment & Plan    1. CHF: she is now hypervolemic; has an order for furosemide drip, will give a starting "bolus". BP relatively low Phenylephrine may be best drug for hypotension, if needed. Start daily weights and strict in/out. 2. Mechanical AVR: INR still >2. With her type of valve, risk of thrombosis is not extremely high. Will not resume enoxaparin. Plan to restart warfarin and allow INR to drift back up gradually in 48-72 hours. 3. Rectus sheath hematoma: Hgb 11.2 this AM (peak after transfusion) and 10.5 recheck,  5 hours later. Will keep an eye on levels, but hopefully bleeding has stopped. 4. ARF:  Due to volume loss, rapidly improving. 5. CAD s/p CABG: no angina. Has chronic widespread ST changes, no different from before. Minimal increase in troponin is not unexpected and can be attributed to demand ischemia, not true acute coronary syndrome.  Signed, Sanda Klein, MD  05/26/2016, 1:35 PM

## 2016-05-26 NOTE — Progress Notes (Signed)
Pharmacy Antibiotic Note  Elizabeth Morton is a 60 y.o. female with suspected sepsis.  Pharmacy has been consulted for vancomycin and cefepime dosing. SCr = 1.48, CrCl 39.3 mL.   Plan: Vancomycin loading dose of 1500 mg.  Vancomycin 1000 mg IV every 24 hours.  Goal trough 15-20 mcg/mL. Cefepime 2 g IV every 24 hours. Monitor renal function, cultures, PCT result, and vancomycin levels as needed.   Height: 5\' 2"  (157.5 cm) Weight: 170 lb (77.1 kg) IBW/kg (Calculated) : 50.1  Temp (24hrs), Avg:98 F (36.7 C), Min:97.6 F (36.4 C), Max:98.2 F (36.8 C)   Recent Labs Lab 05/24/16 1251 05/24/16 1318 05/24/16 1320 05/24/16 2111 05/25/16 0020 05/25/16 0720 05/25/16 1216 05/26/16 0547 05/26/16 1108  WBC 14.3*  --   --  21.1* 17.0* 17.5* 21.3* 22.5* 20.0*  CREATININE 1.43*  --  1.40* 1.60*  --  1.76*  --  1.48*  --   LATICACIDVEN  --  3.31*  --   --   --   --   --   --   --      No Known Allergies  Antimicrobials this admission: Vancomycin 11/14 >>  Cefepime 11/14 >>   Microbiology results: 11/14 BCx: pending 11/14 UCx: pending 11/13 MRSA PCR: negative  Thank you for allowing pharmacy to be a part of this patient's care.  Marylou Flesher, PharmD Candidate 05/26/2016, 1:05 PM

## 2016-05-26 NOTE — Progress Notes (Signed)
Patient ID: Elizabeth Morton, female   DOB: July 29, 1955, 60 y.o.   MRN: [id number]  [facility] Surgery Progress Note     Subjective: Patient reports feeling very weak this morning. Her abdomen is sore. Not passing any flatus.  Objective: Vital signs in last 24 hours: Temp:  [97.6 F (36.4 C)-98.7 F (37.1 C)] 98.1 F (36.7 C) (11/14 0320) Pulse Rate:  [99-123] 99 (11/14 0640) Resp:  [17-34] 22 (11/14 0640) BP: (85-121)/(35-83) 97/55 (11/14 0640) SpO2:  [90 %-100 %] 97 % (11/14 0640) Last BM Date: 05/21/16  Intake/Output from previous day: 11/13 0701 - 11/14 0700 In: 2172.1 [P.O.:60; I.V.:1403.8; Blood:708.3] Out: 1835 [Urine:1835] Intake/Output this shift: No intake/output data recorded.  PE: Gen:  Alert, NAD, pleasant Card:  RRR Pulm:  Effort normal Abd: distended, globally tender, present but hypoactive BS, single incision C/D/I, ecchymosis noted  Lab Results:   Recent Labs  05/25/16 1216 05/26/16 0547  WBC 21.3* 22.5*  HGB 7.5* 11.2*  HCT 22.2* 33.2*  PLT 356 350   BMET  Recent Labs  05/25/16 0720 05/26/16 0547  NA 139 139  K 5.3* 4.6  CL 108 107  CO2 23 22  GLUCOSE 135* 137*  BUN 21* 24*  CREATININE 1.76* 1.48*  CALCIUM 8.3* 8.3*   PT/INR  Recent Labs  05/24/16 2111 05/25/16 0720  LABPROT 31.1* 23.4*  INR 2.92 2.04   CMP     Component Value Date/Time   NA 139 05/26/2016 0547   K 4.6 05/26/2016 0547   CL 107 05/26/2016 0547   CO2 22 05/26/2016 0547   GLUCOSE 137 (H) 05/26/2016 0547   BUN 24 (H) 05/26/2016 0547   CREATININE 1.48 (H) 05/26/2016 0547   CREATININE 1.04 04/29/2015 1516   CALCIUM 8.3 (L) 05/26/2016 0547   PROT 5.9 (L) 05/24/2016 1251   ALBUMIN 3.1 (L) 05/24/2016 1251   AST 44 (H) 05/24/2016 1251   ALT 16 05/24/2016 1251   ALKPHOS 74 05/24/2016 1251   BILITOT 1.1 05/24/2016 1251   GFRNONAA 38 (L) 05/26/2016 0547   GFRAA 44 (L) 05/26/2016 0547   Lipase  No results found for: LIPASE     Studies/Results: Ct  Abdomen Pelvis W Contrast  Result Date: 05/24/2016 CLINICAL DATA:  Left-sided abdominal pain with bruising and nausea since yesterday. Patient had umbilical hernia surgery on 05/15/2016. EXAM: CT ABDOMEN AND PELVIS WITH CONTRAST TECHNIQUE: Multidetector CT imaging of the abdomen and pelvis was performed using the standard protocol following bolus administration of intravenous contrast. CONTRAST:  81mL ISOVUE-300 IOPAMIDOL (ISOVUE-300) INJECTION 61% COMPARISON:  06/28/2015 FINDINGS: Lower chest: Subsegmental atelectasis both lung bases. The heart is enlarged. Patient is status post CABG. Hepatobiliary: No focal abnormality within the liver parenchyma. Gallbladder surgically absent. Stable mild prominence extrahepatic bile ducts. Pancreas: No focal mass lesion. No dilatation of the main duct. No intraparenchymal cyst. No peripancreatic edema. Spleen: No splenomegaly. No focal mass lesion. Adrenals/Urinary Tract: No adrenal nodule or mass. Kidneys are unremarkable. No evidence for hydroureter. The urinary bladder appears normal for the degree of distention. Stomach/Bowel: Moderate hiatal hernia noted. Stomach otherwise unremarkable. Duodenum is normally positioned as is the ligament of Treitz. No small bowel wall thickening. No small bowel dilatation. The terminal ileum is normal. The appendix is normal. No gross colonic mass. No colonic wall thickening. No substantial diverticular change. Vascular/Lymphatic: There is abdominal aortic atherosclerosis without aneurysm. There is no gastrohepatic or hepatoduodenal ligament lymphadenopathy. No intraperitoneal or retroperitoneal lymphadenopathy. No pelvic sidewall lymphadenopathy. Reproductive: The uterus has  normal CT imaging appearance. There is no adnexal mass. Other: Small volume free fluid seen around the liver, in both para colic gutters, and in the anatomic pelvis. Fluid around the liver averages 34 Hounsfield units, consistent with blood. Musculoskeletal: Complex  multiloculated anterior abdominal wall hematoma is identified in the abdomen and pelvis. - 3 x 6 x 8 cm collection of blood, fluid, and air is identified in the subcutaneous fat just to the left of the umbilicus. - 7 x 8 x 9 cm hematoma is identified in the midline abdomen. This is probably extraperitoneal given the focal nature of the hematoma, but loculated within the omentum cannot be entirely excluded. This hematoma appears to communicate with the hematoma described immediately below. - 6 x 9 x 11 cm hematoma is identified in the left rectus sheath and this appears to communicate with the hematoma listed below. - 8 x 10 x 9 cm hematoma in the space of Retzius Multiple areas of gas and fluid are seen in the subcutaneous fat of the anterior abdominal wall. Many of these were seen on the preoperative study from 06/28/2015 and may represent injection sites. Bone windows reveal no worrisome lytic or sclerotic osseous lesions. IMPRESSION: 1. Multiloculated large hematoma of the anterior abdominal wall extends from the umbilicus down to the symphysis pubis. A Component contained in the left rectus sheath extends down into the space of Retzius. There appears to be communication between the rectus sheath hematoma and a midline hematoma which is probably extraperitoneal anteriorly between the transversalis fascia and the peritoneal lining given the focal appearance although containment within the omentum/gastrocolic ligament is possible but considered less likely. There is also a superficial component of the hematoma seen anterior to the rectus fascia. 2. Small volume hemoperitoneum. 3. Moderate hiatal hernia. Electronically Signed   By: Misty Stanley M.D.   On: 05/24/2016 14:22    Anti-infectives: Anti-infectives    None       Assessment/Plan S/p Laparoscopic assisted incarcerated ventral incisional hernia repair with Ventralex mesh patch 8cm 11/3 Dr. Hassell Done - Sent home on SQ lovenox injections with bridge to  coumadin on 11/5 - developed Left rectus sheath hematoma - anemic on arrival (hg 7.0), has received 2 uPRBC and most recent Hg 11.2  Abnormal troponin and EKG - cardiology managing AS  CAD (s/p CABG x2 : LIMA to LAD; SVG to diagonal, and AVR (mechanical AVR as part of Bentall procedure in 2006) HTN HL H/o CVA elated to thrombosed aortic root aneurysm GERD  Plan - abd XR shows Mass effect from known abdominal wall and extraperitoneal hematoma, Normal bowel gas pattern.  Recommend NPO. Check INR, BNP, and CXR this AM, and CBC this afternoon. Transfuse PRN. Would recommend continuing to hold all anticoagulation due to concern for worsening abdominal bleeding. Cardiology and CCM have been consulted for assistance with multiple medical problems.   LOS: 2 days    Jerrye Beavers , [facility] Surgery 05/26/2016, 7:24 AM Pager: [phone] Consults: [phone] Mon-Fri 7:00 am-4:30 pm Sat-Sun 7:00 am-11:30 am

## 2016-05-27 ENCOUNTER — Inpatient Hospital Stay (HOSPITAL_COMMUNITY): Payer: Commercial Managed Care - HMO

## 2016-05-27 DIAGNOSIS — I959 Hypotension, unspecified: Secondary | ICD-10-CM

## 2016-05-27 DIAGNOSIS — D62 Acute posthemorrhagic anemia: Secondary | ICD-10-CM

## 2016-05-27 DIAGNOSIS — Z7901 Long term (current) use of anticoagulants: Secondary | ICD-10-CM

## 2016-05-27 DIAGNOSIS — Z8719 Personal history of other diseases of the digestive system: Secondary | ICD-10-CM

## 2016-05-27 DIAGNOSIS — Z952 Presence of prosthetic heart valve: Secondary | ICD-10-CM

## 2016-05-27 DIAGNOSIS — Z9889 Other specified postprocedural states: Secondary | ICD-10-CM

## 2016-05-27 DIAGNOSIS — I5033 Acute on chronic diastolic (congestive) heart failure: Secondary | ICD-10-CM

## 2016-05-27 LAB — TYPE AND SCREEN
ABO/RH(D): AB POS
ABO/RH(D): AB POS
ANTIBODY SCREEN: NEGATIVE
Antibody Screen: NEGATIVE
UNIT DIVISION: 0
UNIT DIVISION: 0
UNIT DIVISION: 0
Unit division: 0
Unit division: 0
Unit division: 0

## 2016-05-27 LAB — URINE CULTURE
Culture: NO GROWTH
SPECIAL REQUESTS: NORMAL

## 2016-05-27 LAB — POCT I-STAT 3, ART BLOOD GAS (G3+)
Acid-Base Excess: 3 mmol/L — ABNORMAL HIGH (ref 0.0–2.0)
BICARBONATE: 26.5 mmol/L (ref 20.0–28.0)
O2 Saturation: 95 %
TCO2: 28 mmol/L (ref 0–100)
pCO2 arterial: 36 mmHg (ref 32.0–48.0)
pH, Arterial: 7.474 — ABNORMAL HIGH (ref 7.350–7.450)
pO2, Arterial: 69 mmHg — ABNORMAL LOW (ref 83.0–108.0)

## 2016-05-27 LAB — BASIC METABOLIC PANEL
ANION GAP: 10 (ref 5–15)
Anion gap: 11 (ref 5–15)
BUN: 18 mg/dL (ref 6–20)
BUN: 20 mg/dL (ref 6–20)
CALCIUM: 8 mg/dL — AB (ref 8.9–10.3)
CHLORIDE: 103 mmol/L (ref 101–111)
CO2: 25 mmol/L (ref 22–32)
CO2: 26 mmol/L (ref 22–32)
CREATININE: 1.13 mg/dL — AB (ref 0.44–1.00)
Calcium: 8 mg/dL — ABNORMAL LOW (ref 8.9–10.3)
Chloride: 99 mmol/L — ABNORMAL LOW (ref 101–111)
Creatinine, Ser: 1.18 mg/dL — ABNORMAL HIGH (ref 0.44–1.00)
GFR calc non Af Amer: 52 mL/min — ABNORMAL LOW (ref >60)
GFR, EST AFRICAN AMERICAN: 57 mL/min — AB (ref >60)
GFR, EST NON AFRICAN AMERICAN: 49 mL/min — AB (ref >60)
Glucose, Bld: 149 mg/dL — ABNORMAL HIGH (ref 65–99)
Glucose, Bld: 207 mg/dL — ABNORMAL HIGH (ref 65–99)
Potassium: 3.1 mmol/L — ABNORMAL LOW (ref 3.5–5.1)
Potassium: 3.3 mmol/L — ABNORMAL LOW (ref 3.5–5.1)
SODIUM: 135 mmol/L (ref 135–145)
Sodium: 139 mmol/L (ref 135–145)

## 2016-05-27 LAB — MAGNESIUM: Magnesium: 1.9 mg/dL (ref 1.7–2.4)

## 2016-05-27 LAB — PROCALCITONIN: PROCALCITONIN: 0.46 ng/mL

## 2016-05-27 LAB — CBC
HEMATOCRIT: 29.5 % — AB (ref 36.0–46.0)
HEMOGLOBIN: 9.8 g/dL — AB (ref 12.0–15.0)
MCH: 29 pg (ref 26.0–34.0)
MCHC: 33.2 g/dL (ref 30.0–36.0)
MCV: 87.3 fL (ref 78.0–100.0)
Platelets: 349 10*3/uL (ref 150–400)
RBC: 3.38 MIL/uL — ABNORMAL LOW (ref 3.87–5.11)
RDW: 15.3 % (ref 11.5–15.5)
WBC: 17 10*3/uL — ABNORMAL HIGH (ref 4.0–10.5)

## 2016-05-27 LAB — PROTIME-INR
INR: 2.79
Prothrombin Time: 30 seconds — ABNORMAL HIGH (ref 11.4–15.2)

## 2016-05-27 LAB — PHOSPHORUS: PHOSPHORUS: 3.1 mg/dL (ref 2.5–4.6)

## 2016-05-27 MED ORDER — POTASSIUM CHLORIDE 10 MEQ/50ML IV SOLN
10.0000 meq | INTRAVENOUS | Status: AC
  Administered 2016-05-27 (×4): 10 meq via INTRAVENOUS
  Filled 2016-05-27 (×5): qty 50

## 2016-05-27 MED ORDER — POTASSIUM CHLORIDE CRYS ER 20 MEQ PO TBCR
40.0000 meq | EXTENDED_RELEASE_TABLET | Freq: Once | ORAL | Status: AC
  Administered 2016-05-27: 40 meq via ORAL
  Filled 2016-05-27: qty 2

## 2016-05-27 MED ORDER — POLYETHYLENE GLYCOL 3350 17 G PO PACK
17.0000 g | PACK | Freq: Every day | ORAL | Status: DC
  Administered 2016-05-27: 17 g via ORAL
  Filled 2016-05-27 (×2): qty 1

## 2016-05-27 NOTE — Progress Notes (Signed)
Patient Name: Elizabeth Morton Date of Encounter: 05/27/2016  Primary Cardiologist: [facility] Problem List     Principal Problem:   Acute on chronic diastolic congestive heart failure [facility]) Active Problems:   Warfarin anticoagulation   S/P aortic valve replacement   Acute blood loss anemia   S/P repair of ventral hernia Nov 2017   Rectus sheath hematoma   Hypotension   Respiratory failure (HCC)     Subjective   Dyspnea improved. Not requesting analgetic meds. +ve flatus. No BM since Thursday. Requiring low dose pressors.  Slow diuresis.  Hgb a little lower (11.2--10.5--9.8). INR increased to 2.79. Creatinine continues to improve. K 3.1.  Inpatient Medications    Scheduled Meds: . sodium chloride  10 mL/hr Intravenous Once  . ceFEPime (MAXIPIME) IV  2 g Intravenous Q24H  . docusate sodium  100 mg Oral BID  . hydrocortisone sodium succinate  50 mg Intravenous Q6H  . ipratropium-albuterol  3 mL Nebulization TID  . levothyroxine  50 mcg Oral QAC breakfast  . mouth rinse  15 mL Mouth Rinse BID  . pantoprazole  40 mg Oral Daily  . potassium chloride  10 mEq Intravenous Q1 Hr x 4  . vancomycin  1,000 mg Intravenous Q24H   Continuous Infusions: . furosemide (LASIX) infusion 5 mg/hr (05/27/16 0800)  . norepinephrine (LEVOPHED) Adult infusion 3 mcg/min (05/27/16 0800)   PRN Meds: acetaminophen, HYDROmorphone (DILAUDID) injection, ondansetron **OR** ondansetron (ZOFRAN) IV   Vital Signs    Vitals:   05/27/16 0600 05/27/16 0748 05/27/16 0800 05/27/16 0818  BP: (!) 108/56  (!) 68/58 98/63  Pulse: 79  81 83  Resp: (!) 22  (!) 23 (!) 22  Temp:   98.1 F (36.7 C)   TempSrc:   Oral   SpO2: 96% 94% 93% 96%  Weight:      Height:        Intake/Output Summary (Last 24 hours) at 05/27/16 0916 Last data filed at 05/27/16 [id number]  Gross per 24 hour  Intake          1559.98 ml  Output             2965 ml  Net         -1405.02 ml   Filed Weights   05/24/16 1256  05/27/16 0400  Weight: 170 lb (77.1 kg) 173 lb 4.5 oz (78.6 kg)    Physical Exam   Appears more comfortable GEN: Well nourished, well developed, in no acute distress.  HEENT: Grossly normal.  Neck: Supple, no JVD, carotid bruits, or masses. Cardiac: RRR with crips prosthetic valve clicks, no murmurs, rubs, or gallops. No clubbing, cyanosis, edema.  Radials/DP/PT 2+ and equal bilaterally.  Respiratory:  Respirations regular and unlabored, clear to auscultation bilaterally. GI: Soft, nontender, nondistended, BS + x 4.  MS: no deformity or atrophy. Skin: warm and dry, no rash. Neuro:  Strength and sensation are intact. Psych: AAOx3.  Normal affect.  Labs    CBC  Recent Labs  05/24/16 1251  05/26/16 1108 05/27/16 0420  WBC 14.3*  < > 20.0* 17.0*  NEUTROABS 12.0*  --   --   --   HGB 7.0*  < > 10.5* 9.8*  HCT 22.3*  < > 30.7* 29.5*  MCV 86.4  < > 87.2 87.3  PLT 449*  < > 314 349  < > = values in this interval not displayed. Basic Metabolic Panel  Recent Labs  05/26/16 0547 05/27/16 0420  NA  139 139  K 4.6 3.1*  CL 107 103  CO2 22 25  GLUCOSE 137* 149*  BUN 24* 18  CREATININE 1.48* 1.13*  CALCIUM 8.3* 8.0*  MG  --  1.9  PHOS  --  3.1   Liver Function Tests  Recent Labs  05/24/16 1251  AST 44*  ALT 16  ALKPHOS 74  BILITOT 1.1  PROT 5.9*  ALBUMIN 3.1*   No results for input(s): LIPASE, AMYLASE in the last 72 hours. Cardiac Enzymes  Recent Labs  05/25/16 1429 05/25/16 1841  CKTOTAL 54  --   CKMB 15.0*  --   TROPONINI 0.04* 0.16*    Telemetry    NSR - Personally Reviewed  Radiology    Dg Chest Port 1 View  Result Date: 05/27/2016 CLINICAL DATA:  Shortness of breath/respiratory failure EXAM: PORTABLE CHEST 1 VIEW COMPARISON:  May 26, 2016 FINDINGS: Central catheter tip is in the superior cava. No pneumothorax. There remains patchy airspace disease throughout the right mid lower lung zones. There is new consolidation in the medial left base.  There is cardiomegaly with pulmonary vascularity within normal limits. Patient is status post aortic valve replacement. There is atherosclerotic calcification in the aorta. No adenopathy. No bone lesions. IMPRESSION: New consolidation medial left base. Patchy airspace opacity in the right middle lung zone regions remains. These areas are felt most likely represent multifocal pneumonia. Stable cardiac silhouette. There is aortic atherosclerosis. No evident pneumothorax. Electronically Signed   By: Lowella Grip III M.D.   On: 05/27/2016 08:06   Dg Chest Port 1 View  Result Date: 05/26/2016 CLINICAL DATA:  Status post central line placement ; history of CABG, aortic valve replacement EXAM: PORTABLE CHEST 1 VIEW COMPARISON:  Portable chest x-ray of May 26, 2016 at 9:56 a.m. FINDINGS: There has been interval placement of right internal jugular venous catheter. The tip overlies the midportion of the SVC. There is no postprocedure pneumothorax or hemo thorax. The lungs are mildly hypoinflated. The interstitial markings remain increased diffusely with areas of confluence. The cardiac silhouette remains enlarged. IMPRESSION: No postprocedure complication following right internal jugular venous catheter placement. Electronically Signed   By: David  Martinique M.D.   On: 05/26/2016 14:04   Dg Chest Port 1 View  Result Date: 05/26/2016 CLINICAL DATA:  Shortness of breath, chest pain. History of coronary artery disease, aortic valve replacement for aortic stenosis, previous CVA, former smoker. EXAM: PORTABLE CHEST 1 VIEW COMPARISON:  PA and lateral chest x-ray of July 15, 2012 FINDINGS: The lungs are hypoinflated. The interstitial markings are diffusely increased. The pulmonary vascularity is engorged. The cardiac silhouette is enlarged. There is partial obscuration of the left hemidiaphragm. The sternal wires are intact. The prosthetic aortic valve ring is in stable position. There is calcification in the wall  of the thoracic aortic arch. The bony thorax is unremarkable. IMPRESSION: CHF with pulmonary interstitial and mild alveolar edema. No definite pneumonia. Thoracic aortic atherosclerosis. Electronically Signed   By: David  Martinique M.D.   On: 05/26/2016 10:18   Dg Abd Portable 1v  Result Date: 05/26/2016 CLINICAL DATA:  Abdominal distention EXAM: PORTABLE ABDOMEN - 1 VIEW COMPARISON:  CT from 2 days ago FINDINGS: Hazy density in the central abdomen with paucity of bowel loops correlating with large abdominal wall and extraperitoneal hematoma. No evidence of bowel obstruction. Cholecystectomy clips. IMPRESSION: Mass effect from known abdominal wall and extraperitoneal hematoma. Normal bowel gas pattern. Electronically Signed   By: Monte Fantasia M.D.   On:  05/26/2016 07:47    Cardiac Studies   ECHO 05/08/16  - Left ventricle: The cavity size was normal. There was mild concentric hypertrophy. Systolic function was normal. The estimated ejection fraction was in the range of 60% to 65%. Wall motion was normal; there were no regional wall motion abnormalities. Features are consistent with a pseudonormal left ventricular filling pattern, with concomitant abnormal relaxation and increased filling pressure (grade 2 diastolic dysfunction). - Ventricular septum: Septal motion showed paradox. - Aortic valve: A mechanical prosthesis was present. Transvalvular velocity was increased, in the range of moderate stenosis, but unchanged from the 2016 study. - Left atrium: The atrium was moderately dilated. - Pericardium, extracardiac: A trivial pericardial effusion was identified.  Patient Profile    60 yo woman with rectus sheath hematoma during enoxaparin-warfarin "bridging" for mechanical AVR following ventral hernia repair, complicated by moderate acute blood loss anemia, mild acute renal insufficiency and acute exacerbation of chronic diastolic heart failure following volume  resuscitation. Background problems include CAD s/p CABG and moderately elevated AV prosthesis gradients.  Assessment & Plan    1. CHF: she is probably still hypervolemic; volume assessment difficult due to 3rd spacing in intestines and subcutaneous blood volume. Continue furosemide drip, replace K. BP relatively low, but minimal pressor requirements Prefer phenylephrine for hypotension, if higher doses needed. Daily weights and strict in/out. BNP elevated, but no baseline for comparison. 2. Mechanical AVR: INR still >2. With her type of valve, risk of thrombosis is not extremely high. Will not resume enoxaparin, even when INR<2. Allow gradual changes with warfarin rather than immediately acting anticoagulant bolus. 3. Rectus sheath hematoma: Hgb 11.2 (peak after transfusion) to 10.5 yesterday to 9.8 today. Will keep an eye on levels, but hopefully bleeding has stopped. 4. ARF:  rapidly improving, virtually resolved. 5. CAD s/p CABG: no angina. Has chronic widespread ST changes, no different from before. Minimal increase in troponin is not unexpected and can be attributed to demand ischemia, not true acute coronary syndrome.  Passing gas. I think we can start clear liquids, unless surgery objects.  Signed, Sanda Klein, MD  05/27/2016, 9:16 AM

## 2016-05-27 NOTE — Progress Notes (Signed)
[facility] Progress Note Patient Name: Elizabeth Morton DOB: [birth year] MRN: [id number]   Date of Service  05/27/2016  HPI/Events of Note  Low K  eICU Interventions  Replaced with 28meq KCl via CVL     Intervention Category Major Interventions: Electrolyte abnormality - evaluation and management  Nilay Mangrum 05/27/2016, 6:03 AM

## 2016-05-27 NOTE — Progress Notes (Signed)
Patient ID: Elizabeth Morton, female   DOB: Mar 09, 1956, 60 y.o.   MRN: [id number]  [facility] Surgery Progress Note     Subjective: Slowly improving. Denies dyspnea today. Some persistent abdominal pain/distension. Passing flatus today, no BM since Thursday. Denies n/v.   Objective: Vital signs in last 24 hours: Temp:  [97.8 F (36.6 C)-98.1 F (36.7 C)] 98.1 F (36.7 C) (11/15 0800) Pulse Rate:  [76-109] 81 (11/15 0900) Resp:  [16-29] 19 (11/15 0900) BP: (68-124)/(50-89) 101/50 (11/15 0900) SpO2:  [87 %-98 %] 96 % (11/15 0900) Weight:  [173 lb 4.5 oz (78.6 kg)] 173 lb 4.5 oz (78.6 kg) (11/15 0400) Last BM Date: 05/21/16  Intake/Output from previous day: 11/14 0701 - 11/15 0700 In: 1667.4 [P.O.:340; I.V.:777.4; IV Piggyback:550] Out: 2640 [Urine:2640] Intake/Output this shift: Total I/O In: 198.9 [I.V.:48.9; IV Piggyback:150] Out: 425 [Urine:425]  PE: Gen:  Alert, NAD, pleasant Card:  RRR Pulm:  Effort normal, CTAB Abd: distended, mild global tenderness, +BS, single incision C/D/I, ecchymosis noted but no increase from yesterday  Lab Results:   Recent Labs  05/26/16 1108 05/27/16 0420  WBC 20.0* 17.0*  HGB 10.5* 9.8*  HCT 30.7* 29.5*  PLT 314 349   BMET  Recent Labs  05/26/16 0547 05/27/16 0420  NA 139 139  K 4.6 3.1*  CL 107 103  CO2 22 25  GLUCOSE 137* 149*  BUN 24* 18  CREATININE 1.48* 1.13*  CALCIUM 8.3* 8.0*   PT/INR  Recent Labs  05/26/16 0745 05/27/16 0420  LABPROT 25.3* 30.0*  INR 2.26 2.79   CMP     Component Value Date/Time   NA 139 05/27/2016 0420   K 3.1 (L) 05/27/2016 0420   CL 103 05/27/2016 0420   CO2 25 05/27/2016 0420   GLUCOSE 149 (H) 05/27/2016 0420   BUN 18 05/27/2016 0420   CREATININE 1.13 (H) 05/27/2016 0420   CREATININE 1.04 04/29/2015 1516   CALCIUM 8.0 (L) 05/27/2016 0420   PROT 5.9 (L) 05/24/2016 1251   ALBUMIN 3.1 (L) 05/24/2016 1251   AST 44 (H) 05/24/2016 1251   ALT 16 05/24/2016 1251   ALKPHOS 74  05/24/2016 1251   BILITOT 1.1 05/24/2016 1251   GFRNONAA 52 (L) 05/27/2016 0420   GFRAA >60 05/27/2016 0420   Lipase  No results found for: LIPASE     Studies/Results: Dg Chest Port 1 View  Result Date: 05/27/2016 CLINICAL DATA:  Shortness of breath/respiratory failure EXAM: PORTABLE CHEST 1 VIEW COMPARISON:  May 26, 2016 FINDINGS: Central catheter tip is in the superior cava. No pneumothorax. There remains patchy airspace disease throughout the right mid lower lung zones. There is new consolidation in the medial left base. There is cardiomegaly with pulmonary vascularity within normal limits. Patient is status post aortic valve replacement. There is atherosclerotic calcification in the aorta. No adenopathy. No bone lesions. IMPRESSION: New consolidation medial left base. Patchy airspace opacity in the right middle lung zone regions remains. These areas are felt most likely represent multifocal pneumonia. Stable cardiac silhouette. There is aortic atherosclerosis. No evident pneumothorax. Electronically Signed   By: Lowella Grip III M.D.   On: 05/27/2016 08:06   Dg Chest Port 1 View  Result Date: 05/26/2016 CLINICAL DATA:  Status post central line placement ; history of CABG, aortic valve replacement EXAM: PORTABLE CHEST 1 VIEW COMPARISON:  Portable chest x-ray of May 26, 2016 at 9:56 a.m. FINDINGS: There has been interval placement of right internal jugular venous catheter. The tip overlies the  midportion of the SVC. There is no postprocedure pneumothorax or hemo thorax. The lungs are mildly hypoinflated. The interstitial markings remain increased diffusely with areas of confluence. The cardiac silhouette remains enlarged. IMPRESSION: No postprocedure complication following right internal jugular venous catheter placement. Electronically Signed   By: David  Martinique M.D.   On: 05/26/2016 14:04   Dg Chest Port 1 View  Result Date: 05/26/2016 CLINICAL DATA:  Shortness of breath,  chest pain. History of coronary artery disease, aortic valve replacement for aortic stenosis, previous CVA, former smoker. EXAM: PORTABLE CHEST 1 VIEW COMPARISON:  PA and lateral chest x-ray of July 15, 2012 FINDINGS: The lungs are hypoinflated. The interstitial markings are diffusely increased. The pulmonary vascularity is engorged. The cardiac silhouette is enlarged. There is partial obscuration of the left hemidiaphragm. The sternal wires are intact. The prosthetic aortic valve ring is in stable position. There is calcification in the wall of the thoracic aortic arch. The bony thorax is unremarkable. IMPRESSION: CHF with pulmonary interstitial and mild alveolar edema. No definite pneumonia. Thoracic aortic atherosclerosis. Electronically Signed   By: David  Martinique M.D.   On: 05/26/2016 10:18   Dg Abd Portable 1v  Result Date: 05/26/2016 CLINICAL DATA:  Abdominal distention EXAM: PORTABLE ABDOMEN - 1 VIEW COMPARISON:  CT from 2 days ago FINDINGS: Hazy density in the central abdomen with paucity of bowel loops correlating with large abdominal wall and extraperitoneal hematoma. No evidence of bowel obstruction. Cholecystectomy clips. IMPRESSION: Mass effect from known abdominal wall and extraperitoneal hematoma. Normal bowel gas pattern. Electronically Signed   By: Monte Fantasia M.D.   On: 05/26/2016 07:47    Anti-infectives: Anti-infectives    Start     Dose/Rate Route Frequency Ordered Stop   05/27/16 1400  ceFEPIme (MAXIPIME) 2 g in dextrose 5 % 50 mL IVPB     2 g 100 mL/hr over 30 Minutes Intravenous Every 24 hours 05/26/16 1314     05/27/16 1400  vancomycin (VANCOCIN) IVPB 1000 mg/200 mL premix     1,000 mg 200 mL/hr over 60 Minutes Intravenous Every 24 hours 05/26/16 1315     05/26/16 1430  ceFEPIme (MAXIPIME) 2 g in dextrose 5 % 50 mL IVPB     2 g 100 mL/hr over 30 Minutes Intravenous  Once 05/26/16 1251 05/26/16 1556   05/26/16 1330  vancomycin (VANCOCIN) 1,500 mg in sodium chloride  0.9 % 500 mL IVPB     1,500 mg 250 mL/hr over 120 Minutes Intravenous  Once 05/26/16 1251 05/26/16 1534       Assessment/Plan S/p Laparoscopic assisted incarcerated ventral incisional hernia repair with Ventralex mesh patch 8cm 11/3 Dr. Hassell Done - Sent home on SQ lovenox injections with bridge to coumadin on 11/5 - developed Left rectus sheath hematoma - anemic on arrival (hg 7.0), has received 2 uPRBC and 2 uFFP. Hg 10.5 yesterday and 9.8 this AM, will continue to monitor - INR 2.79, up today from 2.26 yesterday - WBC 17.0 today from 20 yesterday  Abnormal troponin and EKG - chronic widespread ST changes similar to before, cardiology suspects demand ischemia CHF Acute hypoxic respiratory failure AS  CAD s/p CABG x2 - on chronic anticoagulation at home Mechanical AVR  HTN HL H/o CVA elated to thrombosed aortic root aneurysm GERD  ID - Cefepime 11/14 >>, anco 11/14 >>  FEN - clears  Plan - appreciate CCM/cardiology assistance with this difficult patient. Advance to clears. Continue colace BID. Hg slightly down today (9.8 from 10.5), will continue  to monitor. Recommend continuing to hold anticoagulation. Transfuse PRN, but hopefully bleeding has stopped. Check CBC and INR in AM   LOS: 3 days    Jerrye Beavers , [facility] Surgery 05/27/2016, 9:50 AM Pager: [phone] Consults: [phone] Mon-Fri 7:00 am-4:30 pm Sat-Sun 7:00 am-11:30 am

## 2016-05-27 NOTE — Progress Notes (Signed)
PULMONARY / CRITICAL CARE MEDICINE   Name: Elizabeth Morton MRN: [id number] DOB: [birth year]    ADMISSION DATE:  05/24/2016 CONSULTATION DATE:  05/26/2016  REFERRING MD:  CCS  CHIEF COMPLAINT:  Respiratory Distress  HISTORY OF PRESENT ILLNESS:   60yo woman with multiple comorbidities as described below who underwent a lap-assisted ventral hernia repair by Dr. Hassell Done on 11/3. The surgery and her post-op course were uncomplicated. She was discharged on 11/5 on a lovenox bridge and coumadin, which she takes for her mechanical aortic valve. She has had evolving abdominal wall bruising since surgery, but today when she looked in the mirror felt her abdomen to be more swollen, and has had worsening abdominal wall and back pain, particularly on the left abdominal wall. She has nausea and generally felt ill, so presented to the ER for evaluation. She was noted to have a 5-point drop in her hemoglobin since it was last checked 5 days ago. Of note she has still been taking her lovenox shots up to last night. A CT scan reveals multiple abdominal wall hematomas. Left rectus sheath hematoma, related to anticoagulation  and recent surgery resulting in  acute blood loss anemia.  SUBJECTIVE:  She feels better, less dyspnea Currently on norepi 3 and lasix gtt  VITAL SIGNS: BP (!) 108/56   Pulse 79   Temp 97.8 F (36.6 C) (Oral)   Resp (!) 22   Ht 5\' 2"  (1.575 m)   Wt 78.6 kg (173 lb 4.5 oz)   SpO2 96%   BMI 31.69 kg/m   HEMODYNAMICS: CVP:  [5 mmHg-12 mmHg] 5 mmHg  VENTILATOR SETTINGS:    INTAKE / OUTPUT: I/O last 3 completed shifts: In: 2804.5 [P.O.:340; I.V.:1206.1; Blood:708.3; IV Piggyback:550] Out: 4100 [Urine:4100]  PHYSICAL EXAMINATION: General: No respiratory distress Neuro:  A&O x 3, cranial nerves grossly  Intact, MAE x 4 without difficulty HEENT: Normal Cardiovascular: PMI non-displaced, regular rate and rhythm, no rubs, gallops or + murmur , Full carotids Lungs: No wheeze,  moving air well, few bibasilar crackles Abdomen: Distended, firm,tender to touch, BS present, but quiet.Bruising Extremities: no cyanosis, clubbing, rash or edema Skin:Intact with exception of abdominal incision/ steri strips, bruising  LABS:  BMET  Recent Labs Lab 05/25/16 0720 05/26/16 0547 05/27/16 0420  NA 139 139 139  K 5.3* 4.6 3.1*  CL 108 107 103  CO2 23 22 25   BUN 21* 24* 18  CREATININE 1.76* 1.48* 1.13*  GLUCOSE 135* 137* 149*    Electrolytes  Recent Labs Lab 05/25/16 0720 05/26/16 0547 05/27/16 0420  CALCIUM 8.3* 8.3* 8.0*  MG  --   --  1.9  PHOS  --   --  3.1    CBC  Recent Labs Lab 05/26/16 0547 05/26/16 1108 05/27/16 0420  WBC 22.5* 20.0* 17.0*  HGB 11.2* 10.5* 9.8*  HCT 33.2* 30.7* 29.5*  PLT 350 314 349    Coag's  Recent Labs Lab 05/24/16 1251 05/24/16 2111 05/25/16 0720 05/26/16 0745 05/27/16 0420  APTT 49* 48* 41*  --   --   INR 2.96 2.92 2.04 2.26 2.79    Sepsis Markers  Recent Labs Lab 05/24/16 1318 05/26/16 1331  LATICACIDVEN 3.31*  --   PROCALCITON  --  0.51    ABG  Recent Labs Lab 05/27/16 0541  PHART 7.474*  PCO2ART 36.0  PO2ART 69.0*    Liver Enzymes  Recent Labs Lab 05/24/16 1251  AST 44*  ALT 16  ALKPHOS 74  BILITOT 1.1  ALBUMIN  3.1*    Cardiac Enzymes  Recent Labs Lab 05/25/16 1429 05/25/16 1841  TROPONINI 0.04* 0.16*    Glucose No results for input(s): GLUCAP in the last 168 hours.  Imaging Dg Chest Port 1 View  Result Date: 05/26/2016 CLINICAL DATA:  Status post central line placement ; history of CABG, aortic valve replacement EXAM: PORTABLE CHEST 1 VIEW COMPARISON:  Portable chest x-ray of May 26, 2016 at 9:56 a.m. FINDINGS: There has been interval placement of right internal jugular venous catheter. The tip overlies the midportion of the SVC. There is no postprocedure pneumothorax or hemo thorax. The lungs are mildly hypoinflated. The interstitial markings remain  increased diffusely with areas of confluence. The cardiac silhouette remains enlarged. IMPRESSION: No postprocedure complication following right internal jugular venous catheter placement. Electronically Signed   By: David  Martinique M.D.   On: 05/26/2016 14:04   Dg Chest Port 1 View  Result Date: 05/26/2016 CLINICAL DATA:  Shortness of breath, chest pain. History of coronary artery disease, aortic valve replacement for aortic stenosis, previous CVA, former smoker. EXAM: PORTABLE CHEST 1 VIEW COMPARISON:  PA and lateral chest x-ray of July 15, 2012 FINDINGS: The lungs are hypoinflated. The interstitial markings are diffusely increased. The pulmonary vascularity is engorged. The cardiac silhouette is enlarged. There is partial obscuration of the left hemidiaphragm. The sternal wires are intact. The prosthetic aortic valve ring is in stable position. There is calcification in the wall of the thoracic aortic arch. The bony thorax is unremarkable. IMPRESSION: CHF with pulmonary interstitial and mild alveolar edema. No definite pneumonia. Thoracic aortic atherosclerosis. Electronically Signed   By: David  Martinique M.D.   On: 05/26/2016 10:18   Dg Abd Portable 1v  Result Date: 05/26/2016 CLINICAL DATA:  Abdominal distention EXAM: PORTABLE ABDOMEN - 1 VIEW COMPARISON:  CT from 2 days ago FINDINGS: Hazy density in the central abdomen with paucity of bowel loops correlating with large abdominal wall and extraperitoneal hematoma. No evidence of bowel obstruction. Cholecystectomy clips. IMPRESSION: Mass effect from known abdominal wall and extraperitoneal hematoma. Normal bowel gas pattern. Electronically Signed   By: Monte Fantasia M.D.   On: 05/26/2016 07:47     STUDIES:  CT Abdomen/Pelvis 05/24/2016:   Multiloculated large hematoma of the anterior abdominal wall extends from the umbilicus down to the symphysis pubis. A Component contained in the left rectus sheath extends down into the space of Retzius.  There appears to be communication between the rectus sheath hematoma and a midline hematoma which is probably extraperitoneal anteriorly between the transversalis fascia and the peritoneal lining given the focal appearance although containment within the omentum/gastrocolic ligament is possible but considered less likely. There is also a superficial component of the hematoma seen anterior to the rectus fascia.  Small volume hemoperitoneum.  Moderate hiatal hernia.   Echo on Oc 27 LVEF 60 to 65% with mean gradient across AV prostheiss of 30 mm Hg (unchanged from 2016),    CULTURES: Blood 11/14>> Urine 11/14>>  ANTIBIOTICS: Cefepime 11/14 >>  Vanco 11/14 >>   SIGNIFICANT EVENTS: 11/3>> Lap Assisted ventral hernia discharged home 11/5 on Lovenox/ Coumadin 11/12>> Re-admitted for multiple abdominal wall hematomas and blood loss anemia ( HGB 7.0) 11/12>> 2 Units PRBC and 2 Units FFP 11/14>> Worsening SOB  LINES/TUBES: PIV  DISCUSSION: Pt. Admitted 11/12 with development of hematomas post op lap assisted ventral hernia repair ( 11/3), with blood loss anemia to HGB of 7.0. In patient with mechanical aortic valve and chronic anticoagulation. CCM consulted  11/14 for increased respiratory distress. CXR revealed CHF with pulmonary interstitial and mild alveolar edema. Fluid balance is + 3 Liters. No definite pneumonia, but leukocytosis.      ASSESSMENT / PLAN:  PULMONARY A: Acute Hypoxic Respiratory Failure Pulmonary interstitial and mild alveolar edema, presumed cardiogenic edema  P:   Lasix gtt initiated norepi to facilitate diuretics Follow CXR Continue scheduled DuoNebs every 6 hours. Maintain saturations > 92% IS Pulmonary Toilet   CARDIOVASCULAR A:  EKG with subendocardial ischemic injury ( ? Ischemic) Mechanical Aortic Valve / chronic coumadin ( INR goal 2-3) AVR  Mean gradient 30 mm HG on preop echo  Hypotension post op  EF 60-65% 04/2016  P:  Transfuse for  HGB <7 Maintain MAP > 65 Pressors as needed to allow fluid removal Stress dose steroids added 11/14 BNP trend may be helpful to guide diuresis Appreciate Cardiology's assistance Repeat TTE pending  RENAL A:   Acute renal failure, improving Hypokalemia in seting diuresis  P:  Follow BMP and UOP Avoid nephrotoxic medications Adjust lasix gtt for good diuresis without renal injury Potassium replacement  GASTROINTESTINAL A:   Large anterior wall hematoma  Distended, bruised  and tender to mild palpation Nausea P:  NPO  Protonix daily as prophylaxis Zofran for nausea prn Colace scheduled   HEMATOLOGIC A:   Blood Loss anemia post hernia repair in setting of chronic anti coagulation ( HGB drop to 7) No obvious signs of bleeding currnetly Anti coagulation on hold  INR 2.79 P:  Trend CBC and INR. Would like to see INR drift down without intervention Restart anticoag carefully once INR < 2.0 Transfuse for HGB < 7   INFECTIOUS A:   Leukocytosis Afebrile P:   Blood Cultures x 2 Urine Culture Empiric cefepime + vanco added 11/14, tailor to cx data   ENDOCRINE A:   Hypothyroidism P:   Synthroid CBG's q 4   NEUROLOGIC A:   Alert and appropriate  P:   Sedation for abdominal pain as blood pressure tolerates. Rass Goal 1-2    FAMILY  - Updates: None available at bedside, patient updated in full 11/15  - Inter-disciplinary family meet or Palliative Care meeting due by:  05/31/2016 .   Independent CC time 35 minutes   Baltazar Apo, MD, PhD 05/27/2016, 7:47 AM [facility] [phone] or if no answer [phone]

## 2016-05-28 ENCOUNTER — Other Ambulatory Visit (HOSPITAL_COMMUNITY): Payer: Commercial Managed Care - HMO

## 2016-05-28 ENCOUNTER — Inpatient Hospital Stay (HOSPITAL_COMMUNITY): Payer: Commercial Managed Care - HMO

## 2016-05-28 DIAGNOSIS — R140 Abdominal distension (gaseous): Secondary | ICD-10-CM

## 2016-05-28 DIAGNOSIS — R0602 Shortness of breath: Secondary | ICD-10-CM

## 2016-05-28 DIAGNOSIS — S301XXA Contusion of abdominal wall, initial encounter: Secondary | ICD-10-CM

## 2016-05-28 LAB — BASIC METABOLIC PANEL
ANION GAP: 5 (ref 5–15)
Anion gap: 8 (ref 5–15)
Anion gap: 10 (ref 5–15)
BUN: 14 mg/dL (ref 6–20)
BUN: 18 mg/dL (ref 6–20)
BUN: 19 mg/dL (ref 6–20)
CALCIUM: 8.1 mg/dL — AB (ref 8.9–10.3)
CALCIUM: 8.6 mg/dL — AB (ref 8.9–10.3)
CHLORIDE: 114 mmol/L — AB (ref 101–111)
CO2: 23 mmol/L (ref 22–32)
CO2: 31 mmol/L (ref 22–32)
CO2: 31 mmol/L (ref 22–32)
CREATININE: 1.05 mg/dL — AB (ref 0.44–1.00)
Calcium: 5.4 mg/dL — CL (ref 8.9–10.3)
Chloride: 98 mmol/L — ABNORMAL LOW (ref 101–111)
Chloride: 95 mmol/L — ABNORMAL LOW (ref 101–111)
Creatinine, Ser: 0.73 mg/dL (ref 0.44–1.00)
Creatinine, Ser: 0.92 mg/dL (ref 0.44–1.00)
GFR calc Af Amer: >60 mL/min (ref >60)
GFR calc Af Amer: >60 mL/min (ref >60)
GFR calc Af Amer: >60 mL/min (ref >60)
GFR, EST NON AFRICAN AMERICAN: 57 mL/min — AB (ref >60)
GLUCOSE: 102 mg/dL — AB (ref 65–99)
GLUCOSE: 116 mg/dL — AB (ref 65–99)
GLUCOSE: 105 mg/dL — AB (ref 65–99)
POTASSIUM: 2.3 mmol/L — AB (ref 3.5–5.1)
Potassium: 4 mmol/L (ref 3.5–5.1)
Potassium: 3 mmol/L — ABNORMAL LOW (ref 3.5–5.1)
SODIUM: 142 mmol/L (ref 135–145)
Sodium: 137 mmol/L (ref 135–145)
Sodium: 136 mmol/L (ref 135–145)

## 2016-05-28 LAB — CBC
HCT: 32.7 % — ABNORMAL LOW (ref 36.0–46.0)
HEMATOCRIT: 23.2 % — AB (ref 36.0–46.0)
HEMOGLOBIN: 7.5 g/dL — AB (ref 12.0–15.0)
HEMOGLOBIN: 11.1 g/dL — AB (ref 12.0–15.0)
MCH: 28.3 pg (ref 26.0–34.0)
MCH: 29.4 pg (ref 26.0–34.0)
MCHC: 32.3 g/dL (ref 30.0–36.0)
MCHC: 33.9 g/dL (ref 30.0–36.0)
MCV: 87.5 fL (ref 78.0–100.0)
MCV: 86.5 fL (ref 78.0–100.0)
Platelets: 313 10*3/uL (ref 150–400)
Platelets: 338 10*3/uL (ref 150–400)
RBC: 2.65 MIL/uL — AB (ref 3.87–5.11)
RBC: 3.78 MIL/uL — AB (ref 3.87–5.11)
RDW: 15.5 % (ref 11.5–15.5)
RDW: 15.6 % — ABNORMAL HIGH (ref 11.5–15.5)
WBC: 11.5 10*3/uL — ABNORMAL HIGH (ref 4.0–10.5)
WBC: 11.5 10*3/uL — ABNORMAL HIGH (ref 4.0–10.5)

## 2016-05-28 LAB — PROTIME-INR
INR: 2.13
INR: 1.46
PROTHROMBIN TIME: 17.9 s — AB (ref 11.4–15.2)
Prothrombin Time: 24.2 seconds — ABNORMAL HIGH (ref 11.4–15.2)

## 2016-05-28 LAB — PROCALCITONIN: Procalcitonin: 0.19 ng/mL

## 2016-05-28 LAB — MAGNESIUM
MAGNESIUM: 1.1 mg/dL — AB (ref 1.7–2.4)
Magnesium: 3.8 mg/dL — ABNORMAL HIGH (ref 1.7–2.4)

## 2016-05-28 LAB — PREPARE RBC (CROSSMATCH)

## 2016-05-28 LAB — BRAIN NATRIURETIC PEPTIDE: B NATRIURETIC PEPTIDE 5: 2012.1 pg/mL — AB (ref 0.0–100.0)

## 2016-05-28 MED ORDER — POLYETHYLENE GLYCOL 3350 17 G PO PACK
17.0000 g | PACK | Freq: Every day | ORAL | Status: DC
  Filled 2016-05-28 (×9): qty 1

## 2016-05-28 MED ORDER — MAGNESIUM OXIDE 400 (241.3 MG) MG PO TABS
400.0000 mg | ORAL_TABLET | Freq: Three times a day (TID) | ORAL | Status: DC
  Filled 2016-05-28: qty 1

## 2016-05-28 MED ORDER — FUROSEMIDE 40 MG PO TABS
40.0000 mg | ORAL_TABLET | Freq: Every day | ORAL | Status: DC
  Administered 2016-05-28 – 2016-05-30 (×3): 40 mg via ORAL
  Filled 2016-05-28 (×3): qty 1

## 2016-05-28 MED ORDER — IOPAMIDOL (ISOVUE-300) INJECTION 61%
15.0000 mL | INTRAVENOUS | Status: AC
  Administered 2016-05-28 (×2): 15 mL via ORAL

## 2016-05-28 MED ORDER — SODIUM CHLORIDE 0.9 % IV SOLN: Freq: Once | INTRAVENOUS | Status: DC

## 2016-05-28 MED ORDER — POTASSIUM CHLORIDE CRYS ER 20 MEQ PO TBCR
40.0000 meq | EXTENDED_RELEASE_TABLET | Freq: Two times a day (BID) | ORAL | Status: AC
  Administered 2016-05-28 (×2): 40 meq via ORAL
  Filled 2016-05-28 (×2): qty 2

## 2016-05-28 MED ORDER — SODIUM CHLORIDE 0.9 % IV SOLN: Freq: Once | INTRAVENOUS | Status: AC

## 2016-05-28 MED ORDER — POLYETHYLENE GLYCOL 3350 17 G PO PACK
17.0000 g | PACK | Freq: Two times a day (BID) | ORAL | Status: AC
  Administered 2016-05-28: 17 g via ORAL
  Filled 2016-05-28 (×2): qty 1

## 2016-05-28 MED ORDER — POTASSIUM CHLORIDE CRYS ER 20 MEQ PO TBCR
40.0000 meq | EXTENDED_RELEASE_TABLET | Freq: Every day | ORAL | Status: AC
  Administered 2016-05-28 – 2016-05-29 (×2): 40 meq via ORAL
  Filled 2016-05-28 (×2): qty 2

## 2016-05-28 MED ORDER — MAGNESIUM SULFATE 50 % IJ SOLN
3.0000 g | Freq: Once | INTRAMUSCULAR | Status: AC
  Administered 2016-05-28: 3 g via INTRAVENOUS
  Filled 2016-05-28: qty 6

## 2016-05-28 MED ORDER — VITAMIN K1 10 MG/ML IJ SOLN
5.0000 mg | Freq: Once | INTRAMUSCULAR | Status: AC
  Administered 2016-05-28: 5 mg via INTRAVENOUS
  Filled 2016-05-28: qty 0.5

## 2016-05-28 MED ORDER — WHITE PETROLATUM GEL
Status: AC
  Filled 2016-05-28: qty 1

## 2016-05-28 MED ORDER — SODIUM CHLORIDE 0.9 % IV SOLN
Freq: Once | INTRAVENOUS | Status: AC
  Administered 2016-05-28: 13:00:00 via INTRAVENOUS

## 2016-05-28 MED ORDER — SODIUM CHLORIDE 0.9 % IV SOLN
1.0000 g | Freq: Once | INTRAVENOUS | Status: AC
  Administered 2016-05-28: 1 g via INTRAVENOUS
  Filled 2016-05-28: qty 10

## 2016-05-28 MED ORDER — MAGNESIUM SULFATE 50 % IJ SOLN
3.0000 g | Freq: Once | INTRAVENOUS | Status: AC
  Administered 2016-05-28: 3 g via INTRAVENOUS
  Filled 2016-05-28: qty 6

## 2016-05-28 MED ORDER — IPRATROPIUM-ALBUTEROL 0.5-2.5 (3) MG/3ML IN SOLN
3.0000 mL | Freq: Four times a day (QID) | RESPIRATORY_TRACT | Status: DC | PRN
  Filled 2016-05-28: qty 3

## 2016-05-28 MED ORDER — POTASSIUM CHLORIDE 10 MEQ/50ML IV SOLN
10.0000 meq | INTRAVENOUS | Status: AC
  Administered 2016-05-28 (×4): 10 meq via INTRAVENOUS
  Filled 2016-05-28 (×4): qty 50

## 2016-05-28 NOTE — Progress Notes (Signed)
Pt arrived from 2heart calm and talkative, oxygen sat 91% on 8 liter highflow nasal cannula,reports 4/10 abdominal discomfort, informed pt to notify when she desires pain meds.

## 2016-05-28 NOTE — Progress Notes (Signed)
Patient Name: Elizabeth Morton Date of Encounter: 05/28/2016  Primary Cardiologist: [facility] Problem List     Principal Problem:   Acute on chronic diastolic congestive heart failure [facility]) Active Problems:   Warfarin anticoagulation   S/P aortic valve replacement   Acute blood loss anemia   S/P repair of ventral hernia Nov 2017   Rectus sheath hematoma   Hypotension   Respiratory failure (HCC)     Subjective   No change in abdominal pain. Hgb has dropped to 7.5, for repeat CT this AM. 1 unit PRBC ordered. INR down to 2.1. BP higher, now off norepi Severe hypokalemia and hypomagnesemia are being corrected.  Inpatient Medications    Scheduled Meds: . sodium chloride  10 mL/hr Intravenous Once  . sodium chloride   Intravenous Once  . sodium chloride   Intravenous Once  . calcium gluconate  1 g Intravenous Once  . docusate sodium  100 mg Oral BID  . iopamidol  15 mL Oral Q1 Hr x 2  . ipratropium-albuterol  3 mL Nebulization TID  . levothyroxine  50 mcg Oral QAC breakfast  . magnesium sulfate 1 - 4 g bolus IVPB  3 g Intravenous Once  . magnesium sulfate 1 - 4 g bolus IVPB  3 g Intravenous Once  . mouth rinse  15 mL Mouth Rinse BID  . pantoprazole  40 mg Oral Daily  . polyethylene glycol  17 g Oral BID  . [START ON 05/29/2016] polyethylene glycol  17 g Oral Daily  . potassium chloride  40 mEq Oral BID WC   Continuous Infusions: . furosemide (LASIX) infusion 5 mg/hr (05/27/16 0800)  . norepinephrine (LEVOPHED) Adult infusion 3 mcg/min (05/28/16 0813)   PRN Meds: acetaminophen, HYDROmorphone (DILAUDID) injection, ondansetron **OR** ondansetron (ZOFRAN) IV   Vital Signs    Vitals:   05/28/16 0800 05/28/16 0819 05/28/16 0859 05/28/16 0900  BP: (!) 101/52   90/65  Pulse: 79   75  Resp: (!) 26   (!) 31  Temp:  98.4 F (36.9 C)    TempSrc:  Oral    SpO2: 92%  92% 91%  Weight:      Height:        Intake/Output Summary (Last 24 hours) at 05/28/16  1050 Last data filed at 05/28/16 1030  Gross per 24 hour  Intake          1327.65 ml  Output             2600 ml  Net         -1272.35 ml   Filed Weights   05/27/16 0400 05/28/16 0500 05/28/16 0648  Weight: 173 lb 4.5 oz (78.6 kg) 169 lb 8.5 oz (76.9 kg) 167 lb 15.9 oz (76.2 kg)    Physical Exam  Alert, smiling GEN: Well nourished, well developed, in no acute distress.  HEENT: Grossly normal.  Neck: Supple, no JVD, carotid bruits, or masses. Cardiac: RRR with crips prosthetic valve clicks, no murmurs, rubs, or gallops. No clubbing, cyanosis, edema.  Radials/DP/PT 2+ and equal bilaterally.  Respiratory:  Respirations regular and unlabored, clear to auscultation bilaterally. GI: Soft, nontender, nondistended, BS + x 4.  MS: no deformity or atrophy. Skin: warm and dry, no rash. Neuro:  Strength and sensation are intact. Psych: AAOx3.  Normal affect.  Labs    CBC  Recent Labs  05/27/16 0420 05/28/16 0500  WBC 17.0* 11.5*  HGB 9.8* 7.5*  HCT 29.5* 23.2*  MCV 87.3  87.5  PLT 349 [id number]   Basic Metabolic Panel  Recent Labs  05/27/16 0420  05/28/16 0500 05/28/16 0832  NA 139  < > 142 137  K 3.1*  < > 2.3* 4.0  CL 103  < > 114* 98*  CO2 25  < > 23 31  GLUCOSE 149*  < > 102* 116*  BUN 18  < > 14 18  CREATININE 1.13*  < > 0.73 1.05*  CALCIUM 8.0*  < > 5.4* 8.1*  MG 1.9  --  1.1*  --   PHOS 3.1  --   --   --   < > = values in this interval not displayed. Liver Function Tests No results for input(s): AST, ALT, ALKPHOS, BILITOT, PROT, ALBUMIN in the last 72 hours. No results for input(s): LIPASE, AMYLASE in the last 72 hours. Cardiac Enzymes  Recent Labs  05/25/16 1429 05/25/16 1841  CKTOTAL 54  --   CKMB 15.0*  --   TROPONINI 0.04* 0.16*   Telemetry    NSR - Personally Reviewed  Radiology    Dg Chest Port 1 View  Result Date: 05/28/2016 CLINICAL DATA:  Acute respiratory failure EXAM: PORTABLE CHEST 1 VIEW COMPARISON:  05/27/2016 FINDINGS: 0436 hours. Low  volume film. The cardio pericardial silhouette is enlarged. Pulmonary vascular congestion with interstitial and patchy basilar a (right greater than left) airspace disease persists without substantial change. Small bilateral pleural effusions noted. The visualized bony structures of the thorax are intact. Telemetry leads overlie the chest. IMPRESSION: Cardiomegaly with vascular congestion and patchy bibasilar airspace disease shows no substantial change. Small bilateral pleural effusions. Electronically Signed   By: Misty Stanley M.D.   On: 05/28/2016 08:38   Dg Chest Port 1 View  Result Date: 05/27/2016 CLINICAL DATA:  Shortness of breath/respiratory failure EXAM: PORTABLE CHEST 1 VIEW COMPARISON:  May 26, 2016 FINDINGS: Central catheter tip is in the superior cava. No pneumothorax. There remains patchy airspace disease throughout the right mid lower lung zones. There is new consolidation in the medial left base. There is cardiomegaly with pulmonary vascularity within normal limits. Patient is status post aortic valve replacement. There is atherosclerotic calcification in the aorta. No adenopathy. No bone lesions. IMPRESSION: New consolidation medial left base. Patchy airspace opacity in the right middle lung zone regions remains. These areas are felt most likely represent multifocal pneumonia. Stable cardiac silhouette. There is aortic atherosclerosis. No evident pneumothorax. Electronically Signed   By: Lowella Grip III M.D.   On: 05/27/2016 08:06   Dg Chest Port 1 View  Result Date: 05/26/2016 CLINICAL DATA:  Status post central line placement ; history of CABG, aortic valve replacement EXAM: PORTABLE CHEST 1 VIEW COMPARISON:  Portable chest x-ray of May 26, 2016 at 9:56 a.m. FINDINGS: There has been interval placement of right internal jugular venous catheter. The tip overlies the midportion of the SVC. There is no postprocedure pneumothorax or hemo thorax. The lungs are mildly  hypoinflated. The interstitial markings remain increased diffusely with areas of confluence. The cardiac silhouette remains enlarged. IMPRESSION: No postprocedure complication following right internal jugular venous catheter placement. Electronically Signed   By: David  Martinique M.D.   On: 05/26/2016 14:04   Patient Profile     60 yo woman with rectus sheath hematoma during enoxaparin-warfarin "bridging" for mechanical AVR following ventral hernia repair, complicated by moderate acute blood loss anemia, mild acute renal insufficiency and acute exacerbation of chronic diastolic heart failure following volume resuscitation. Background problems include CAD  s/p CABG and moderately elevated AV prosthesis gradients.  Assessment & Plan    1. CHF: she appears closer to euvolemic status, weight is essentially back to what it was on her 05/01/16 office visit. Off pressors.. Daily weights and strict in/out.  I think we can DC the furosemide drip and this will help with correction of electrolytes. 2. Mechanical AVR: INR still >2. With her type of valve, risk of thrombosis is not extremely high. Will not resume enoxaparin, even when INR<2. Allow gradual changes with warfarin rather than immediately acting anticoagulant bolus. 3. Rectus sheath hematoma: Hgb 11.2 (peak after transfusion) to 9.8 yesterday to 7.5 today. No change in exam. CT today. 4. ARF: resolved. 5.CAD s/p CABG: no angina. Has chronic widespread ST changes, no different from before. Minimal increase in troponin is not unexpected and can be attributed to demand ischemia, not true acute coronary syndrome.  Signed, Sanda Klein, MD  05/28/2016, 10:50 AM

## 2016-05-28 NOTE — Progress Notes (Signed)
Dr Dalbert Batman at bedside, informed md of pt calcium level 5.4 and potassium level 2.3, pt reports abdominal "discomfort" to md.

## 2016-05-28 NOTE — Progress Notes (Addendum)
Surgery:  CT scan shows increase in rectus sheath and retroperitoneal hematoma Discussed with interventional radiology, who say they have nothing to offer. There is not a good surgical approach to this problem either, since the bleeding is probably secondary to Coumadin anticoagulation superimposed on recent surgery and the site of bleeding is not clear  As such, our only option is to aggressively reverse her anticoagulation and hope for the best. Vitamin K and FFP ordered. Transfuse Hgb. above 8.0. Izora Gala, [place] states that she has notifed cardiology of this action.   Edsel Petrin. Dalbert Batman, M.D., [facility] General and Minimally invasive Surgery Breast and Colorectal Surgery Office:   [phone]

## 2016-05-28 NOTE — Progress Notes (Addendum)
PULMONARY / CRITICAL CARE MEDICINE   Name: Elizabeth Morton MRN: [id number] DOB: 12-28-1955    ADMISSION DATE:  05/24/2016 CONSULTATION DATE:  05/26/2016  REFERRING MD:  CCS  CHIEF COMPLAINT:  Respiratory Distress  HISTORY OF PRESENT ILLNESS:   60yo woman with multiple comorbidities as described below who underwent a lap-assisted ventral hernia repair by Dr. Hassell Done on 11/3. The surgery and her post-op course were uncomplicated. She was discharged on 11/5 on a lovenox bridge and coumadin, which she takes for her mechanical aortic valve. She has had evolving abdominal wall bruising since surgery, but today when she looked in the mirror felt her abdomen to be more swollen, and has had worsening abdominal wall and back pain, particularly on the left abdominal wall. She has nausea and generally felt ill, so presented to the ER for evaluation. She was noted to have a 5-point drop in her hemoglobin since it was last checked 5 days ago. Of note she has still been taking her lovenox shots up to last night. A CT scan reveals multiple abdominal wall hematomas. Left rectus sheath hematoma, related to anticoagulation  and recent surgery resulting in  acute blood loss anemia. Developed chf, pulm edema, lasix drip, shock.  SUBJECTIVE:  Remains on levophed 3 mics Lasix drip Neg 1.2 liters  VITAL SIGNS: BP (!) 101/52   Pulse 79   Temp 98.4 F (36.9 C) (Oral)   Resp (!) 26   Ht 5\' 2"  (1.575 m)   Wt 76.2 kg (167 lb 15.9 oz)   SpO2 92%   BMI 30.73 kg/m   HEMODYNAMICS: CVP:  [5 mmHg-13 mmHg] 11 mmHg  VENTILATOR SETTINGS:    INTAKE / OUTPUT: I/O last 3 completed shifts: In: 2148.2 [P.O.:720; I.V.:978.2; IV Piggyback:450] Out: [id number] [Urine:4650]  PHYSICAL EXAMINATION: General: No respiratory distress Neuro:  A&O x 3, cranial nerves grossly  Intact, MAE x 4 without difficulty HEENT: jvd down Cardiovascular: s1 s2 RRR sr , click Lungs: coarse BS Abdomen: Distended, firm,tender to touch, BS  present, Bruising Extremities: limited edema Skin:Intac t with exception of abdominal incision/ steri strips, bruising LABS:  BMET  Recent Labs Lab 05/27/16 0420 05/27/16 1800 05/28/16 0500  NA 139 135 142  K 3.1* 3.3* 2.3*  CL 103 99* 114*  CO2 25 26 23   BUN 18 20 14   CREATININE 1.13* 1.18* 0.73  GLUCOSE 149* 207* 102*    Electrolytes  Recent Labs Lab 05/27/16 0420 05/27/16 1800 05/28/16 0500  CALCIUM 8.0* 8.0* 5.4*  MG 1.9  --  1.1*  PHOS 3.1  --   --     CBC  Recent Labs Lab 05/26/16 1108 05/27/16 0420 05/28/16 0500  WBC 20.0* 17.0* 11.5*  HGB 10.5* 9.8* 7.5*  HCT 30.7* 29.5* 23.2*  PLT 314 349 313    Coag's  Recent Labs Lab 05/24/16 1251 05/24/16 2111 05/25/16 0720 05/26/16 0745 05/27/16 0420 05/28/16 0500  APTT 49* 48* 41*  --   --   --   INR 2.96 2.92 2.04 2.26 2.79 2.13    Sepsis Markers  Recent Labs Lab 05/24/16 1318 05/26/16 1331 05/27/16 0420  LATICACIDVEN 3.31*  --   --   PROCALCITON  --  0.51 0.46    ABG  Recent Labs Lab 05/27/16 0541  PHART 7.474*  PCO2ART 36.0  PO2ART 69.0*    Liver Enzymes  Recent Labs Lab 05/24/16 1251  AST 44*  ALT 16  ALKPHOS 74  BILITOT 1.1  ALBUMIN 3.1*    Cardiac  Enzymes  Recent Labs Lab 05/25/16 1429 05/25/16 1841  TROPONINI 0.04* 0.16*    Glucose No results for input(s): GLUCAP in the last 168 hours.  Imaging No results found.   STUDIES:  CT Abdomen/Pelvis 05/24/2016:   Multiloculated large hematoma of the anterior abdominal wall extends from the umbilicus down to the symphysis pubis. A Component contained in the left rectus sheath extends down into the space of Retzius. There appears to be communication between the rectus sheath hematoma and a midline hematoma which is probably extraperitoneal anteriorly between the transversalis fascia and the peritoneal lining given the focal appearance although containment within the omentum/gastrocolic ligament is  possible but considered less likely. There is also a superficial component of the hematoma seen anterior to the rectus fascia.  Small volume hemoperitoneum.  Moderate hiatal hernia.   Echo on Oc 27 LVEF 60 to 65% with mean gradient across AV prostheiss of 30 mm Hg (unchanged from 2016),    CULTURES: Blood 11/14>> Urine 11/14>>  ANTIBIOTICS: Cefepime 11/14 >>  Vanco 11/14 >>   SIGNIFICANT EVENTS: 11/3>> Lap Assisted ventral hernia discharged home 11/5 on Lovenox/ Coumadin 11/12>> Re-admitted for multiple abdominal wall hematomas and blood loss anemia ( HGB 7.0) 11/12>> 2 Units PRBC and 2 Units FFP 11/14>> Worsening SOB 11/16- lasix drip remains, neg 1.3 liters  LINES/TUBES: PIV  DISCUSSION: Pt. Admitted 11/12 with development of hematomas post op lap assisted ventral hernia repair ( 11/3), with blood loss anemia to HGB of 7.0. In patient with mechanical aortic valve and chronic anticoagulation. CCM consulted 11/14 for increased respiratory distress. CXR revealed CHF with pulmonary interstitial and mild alveolar edema. Fluid balance is + 3 Liters. No definite pneumonia, but leukocytosis.     ASSESSMENT / PLAN:  PULMONARY A: Acute Hypoxic Respiratory Failure Pulmonary interstitial and mild alveolar edema, presumed cardiogenic edema  P:   Lasix to neg balance pcxr resolving edema Continued neg balance goals pcxr in am  IS  CARDIOVASCULAR A:  EKG with subendocardial ischemic injury ( ? Ischemic) Mechanical Aortic Valve / chronic coumadin ( INR goal 2-3) AVR  Mean gradient 30 mm HG on preop echo  Hypotension post op  EF 60-65% 04/2016  P:  Maintain MAP > 65 Pressors as needed to allow fluid removal Stress dose steroids added 11/14 - dc as cortisol greater then 20 Repeat TTE pending Lasix drip  RENAL A:   Acute renal failure, improving Hypokalemia Hyperchloremia hypomag P:  Follow BMP and UOP Lasix drip to remain at current dose k lytes in pm Potassium  replacement and mag Avoid saline with Cl Cl rise come concern error?, repeat bmet now  GASTROINTESTINAL A:   Large anterior wall hematoma  Distended, bruised  and tender to mild palpation Nausea P:  Protonix daily as prophylaxis Zofran for nausea prn Colace scheduled  HEMATOLOGIC A:   Blood Loss anemia post hernia repair in setting of chronic anti coagulation ( HGB drop to 7) No obvious signs of bleeding currnetly Anti coagulation on hold  INR 2.79 P:  Trend CBC and INR. Would like to see INR drift down without intervention Restart anticoag carefully once INR < 2.0 Transfuse for HGB < 7 scd Would NOT use lovenox in future  INFECTIOUS A:   Leukocytosis from bleeds likely Afebrile Neg pct x 2 NOT PNA P:   Remains culture neg with neg pct and no suspicion Dc all abx, monitor clinically  ENDOCRINE A:   Hypothyroidism No AI P:   Synthroid CBG's q  4  Dc roids  NEUROLOGIC A:   Alert and appropriate  P:   Pain control   FAMILY  - Updates: I updated pt  - Inter-disciplinary family meet or Palliative Care meeting due by:  05/31/2016 .  CC time 35 minutes   Lavon Paganini. Titus Mould, MD, [place] Pgr: [facility] 05/28/2016 8:22 AM

## 2016-05-28 NOTE — Progress Notes (Signed)
Surgery:  She was transferred to 2 AM this morning at 6 AM. I asked the patient and the nursing staff and they were both told that she was transferred from 4 N. to to him and was told she is not a cardiac patient Otherwise no apparent change in her condition Tolerating diet but no stool yesterday  On exam she is alert and friendly.  Abdomen is soft but with tender hematoma left side.  Clinically this is not any larger than it was just a day.  Lab work reveals hemoglobin dropped from 9.8 yesterday to 7.5 today.  WBC down to 11.5.  BNP 2012.  INR 2.13. Potassium 2.3.  S/p Laparoscopic assisted incarcerated ventral incisional hernia repair with Ventralex mesh patch 8cm11/3 Dr. Hassell Done - Sent home on SQ lovenox injections with bridge to coumadin on 11/5 - developed Left rectus sheath hematoma - anemic on arrival (hg 7.0), has received [facility] and 2 uFFP. Hg 10.5 yesterday and 9.8 this AM, will continue to monitor - INR slightly lower today.  Still elevated - WBC down today  -Due to 2 g drop in hemoglobin will give 1 unit PRBC Do not give any Lovenox or Coumadin or aspirin please Repeat CT scan to see if hematoma has enlarged  Hypokalemia we'll give IV and by mouth supplements.  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 Advance to clears. Continue colace BID.  Hemoglobin dropped from 9.8-7.5.  Transfuse 1 unit PRBC.  Repeat CT scan  Once we are sure that bleeding has stopped, she may be discharged from a general surgery standpoint unless there is further cardiac workup to be done   [facility].  Dalbert Batman, M.D., [facility] General and Minimally invasive Surgery Breast and Colorectal Surgery Office:   [phone] Pager:   [phone]

## 2016-05-29 ENCOUNTER — Inpatient Hospital Stay (HOSPITAL_COMMUNITY): Payer: Commercial Managed Care - HMO

## 2016-05-29 DIAGNOSIS — R079 Chest pain, unspecified: Secondary | ICD-10-CM

## 2016-05-29 LAB — BASIC METABOLIC PANEL
ANION GAP: 9 (ref 5–15)
BUN: 19 mg/dL (ref 6–20)
CALCIUM: 8.2 mg/dL — AB (ref 8.9–10.3)
CO2: 29 mmol/L (ref 22–32)
CREATININE: 0.91 mg/dL (ref 0.44–1.00)
Chloride: 97 mmol/L — ABNORMAL LOW (ref 101–111)
GFR calc Af Amer: >60 mL/min (ref >60)
GLUCOSE: 93 mg/dL (ref 65–99)
Potassium: 3.6 mmol/L (ref 3.5–5.1)
Sodium: 135 mmol/L (ref 135–145)

## 2016-05-29 LAB — PREPARE FRESH FROZEN PLASMA
UNIT DIVISION: 0
Unit division: 0

## 2016-05-29 LAB — CBC
HCT: 32.9 % — ABNORMAL LOW (ref 36.0–46.0)
Hemoglobin: 10.9 g/dL — ABNORMAL LOW (ref 12.0–15.0)
MCH: 28.8 pg (ref 26.0–34.0)
MCHC: 33.1 g/dL (ref 30.0–36.0)
MCV: 86.8 fL (ref 78.0–100.0)
PLATELETS: 332 10*3/uL (ref 150–400)
RBC: 3.79 MIL/uL — ABNORMAL LOW (ref 3.87–5.11)
RDW: 15.9 % — AB (ref 11.5–15.5)
WBC: 10.3 10*3/uL (ref 4.0–10.5)

## 2016-05-29 LAB — PROTIME-INR
INR: 1.03
Prothrombin Time: 13.5 seconds (ref 11.4–15.2)

## 2016-05-29 LAB — ECHOCARDIOGRAM COMPLETE
HEIGHTINCHES: 62 in
WEIGHTICAEL: 2740.76 [oz_av]

## 2016-05-29 MED ORDER — POTASSIUM CHLORIDE CRYS ER 20 MEQ PO TBCR
20.0000 meq | EXTENDED_RELEASE_TABLET | ORAL | Status: AC
  Administered 2016-05-29: 20 meq via ORAL
  Filled 2016-05-29: qty 1

## 2016-05-29 MED ORDER — SODIUM CHLORIDE 0.9% FLUSH
10.0000 mL | INTRAVENOUS | Status: DC | PRN
  Administered 2016-05-30: 10 mL
  Administered 2016-06-02: 20 mL
  Filled 2016-05-29 (×2): qty 40

## 2016-05-29 NOTE — Progress Notes (Signed)
  Echocardiogram 2D Echocardiogram has been performed.  Elizabeth Morton 05/29/2016, 10:51 AM

## 2016-05-29 NOTE — Progress Notes (Signed)
Subjective: Stable and alert. Tolerating diet and had a bowel movement Pain stable Currently on bedrest. Only complaint is ICU is noisy and cannot rest  No cardiac or pulmonary problems last 24 hours Hemoglobin up to 10.9.  INR down to 1.03.  Glucose 93.  Creatinine 0.91.  Potassium 3.6.  Objective: Vital signs in last 24 hours: Temp:  [97.2 F (36.2 C)-98.4 F (36.9 C)] 97.4 F (36.3 C) (11/17 0348) Pulse Rate:  [71-100] 82 (11/17 0400) Resp:  [14-35] 18 (11/17 0400) BP: (76-118)/(39-102) 102/62 (11/17 0400) SpO2:  [89 %-100 %] 96 % (11/17 0400) Weight:  [76.2 kg (167 lb 15.9 oz)-77.7 kg (171 lb 4.8 oz)] 77.7 kg (171 lb 4.8 oz) (11/17 0415) Last BM Date: 05/28/16  Intake/Output from previous day: 11/16 0701 - 11/17 0700 In: 1338.6 [I.V.:97.7; Blood:874.8; IV Piggyback:366] Out: 1590 [Urine:1590] Intake/Output this shift: Total I/O In: 50 [IV Piggyback:50] Out: 440 [Urine:440]  General appearance: Alert and cooperative.  A little frustrated but minimal physical distress Resp: clear to auscultation bilaterally GI: Right-sided abdomen is soft and benign.  Left side with palpable hematoma.  Seems about the same  Lab Results:   Recent Labs  05/28/16 1825 05/29/16 0412  WBC 11.5* 10.3  HGB 11.1* 10.9*  HCT 32.7* 32.9*  PLT 338 332   BMET  Recent Labs  05/28/16 1827 05/29/16 0412  NA 136 135  K 3.0* 3.6  CL 95* 97*  CO2 31 29  GLUCOSE 105* 93  BUN 19 19  CREATININE 0.92 0.91  CALCIUM 8.6* 8.2*   PT/INR  Recent Labs  05/28/16 1825 05/29/16 0412  LABPROT 17.9* 13.5  INR 1.46 1.03   ABG  Recent Labs  05/27/16 0541  PHART 7.474*  HCO3 26.5    Studies/Results: Ct Abdomen Pelvis Wo Contrast  Result Date: 05/28/2016 CLINICAL DATA:  60 year old female inpatient status post umbilical hernia repair [id number], complicated by large ventral abdominopelvic wall hematoma, presenting for follow-up. EXAM: CT ABDOMEN AND PELVIS WITHOUT CONTRAST  TECHNIQUE: Multidetector CT imaging of the abdomen and pelvis was performed following the standard protocol without IV contrast. COMPARISON:  05/24/2016 CT abdomen/pelvis. FINDINGS: Lower chest: Small layering right greater than left bilateral pleural effusions, new bilaterally. Mild compressive atelectasis in the dependent lower lobes bilaterally. Patchy mild ground-glass attenuation in the lower parahilar lungs bilaterally appears new. Stable top-normal heart size. Partially visualized aortic valve prosthesis is in place. Visualized lower sternotomy wires appear intact. Hepatobiliary: Normal liver with no liver mass. Cholecystectomy. No biliary ductal dilatation. Pancreas: Normal, with no mass or duct dilation. Spleen: Normal size. No mass. Adrenals/Urinary Tract: Normal adrenals. No right renal stones. Punctate nonobstructing 2 mm upper left renal stone. No hydronephrosis. No contour deforming renal mass. Normal caliber ureters. Bladder is completely collapsed by indwelling Foley catheter. No bladder stones or bladder wall thickening. Stomach/Bowel: Stable small to moderate hiatal hernia. Otherwise grossly normal stomach. Normal caliber small bowel with no small bowel wall thickening. Normal appendix. Normal large bowel with no diverticulosis, large bowel wall thickening or pericolonic fat stranding. Vascular/Lymphatic: Atherosclerotic nonaneurysmal abdominal aorta. No pathologically enlarged lymph nodes in the abdomen or pelvis. Reproductive: Grossly normal uterus.  No adnexal mass. Other: No pneumoperitoneum.  No ascites. Large left rectus sheath hematoma measures 9.7 x 4.0 x 14.7 cm, previously 8.6 x 5.0 x 14.7 cm on 05/24/2016 using similar measurement technique, not definitely changed, although with increased layering hyperdense blood products. Large left anterior pelvic extraperitoneal hematoma measures 13.8 x 10.1 x 8.9 cm (series  2/image 69), previously 11.0 x 8.4 x 8.3 cm using similar measurement  technique, increased, with increased internal layering hyperdense blood products. New left posterior paranephric space retroperitoneal 4.3 x 4.3 x 6.5 cm hematoma extending superiorly from the extraperitoneal left anterior pelvic hematoma (series 2/image 49). Midline extraperitoneal ventral abdominal 8.3 x 7.0 x 8.0 cm hematoma with layering hyperdense blood products (series 2/image 43), previously 8.2 x 7.0 x 7.9 cm, not appreciably changed. Deep subcutaneous hematoma in the left supraumbilical ventral abdominal wall with internal gas measures 8.2 x 2.6 x 6.0 cm (series 2/image 44), previously 8.2 x 2.8 by 6.2 cm using similar measurement technique, not appreciably changed. Musculoskeletal: No aggressive appearing focal osseous lesions. Mild thoracolumbar spondylosis . IMPRESSION: 1. Interval growth of large left anterior pelvic extraperitoneal hematoma as described. New left retroperitoneal hematoma extending superiorly from the left anterior pelvic extraperitoneal hematoma. Findings suggest ongoing extraperitoneal bleeding in the left anterior pelvis/ left retroperitoneum. 2. Large left rectus muscle sheath, extraperitoneal ventral midline abdominal and deep subcutaneous ventral abdominal wall hematomas appear stable. 3. New small bilateral pleural effusions, right greater than left. No ascites. 4. New patchy mild ground-glass attenuation in the lower parahilar lungs bilaterally, which could represent mild pulmonary edema or pneumonitis such as from aspiration. 5. Additional findings include aortic atherosclerosis, punctate nonobstructing left renal stone and small to moderate hiatal hernia. These results were called by telephone at the time of interpretation on 05/28/2016 at 12:11 pm to DR. DAN FEINSTEIN, who verbally acknowledged these results. Electronically Signed   By: Ilona Sorrel M.D.   On: 05/28/2016 12:16   Dg Chest Port 1 View  Result Date: 05/28/2016 CLINICAL DATA:  Acute respiratory failure EXAM:  PORTABLE CHEST 1 VIEW COMPARISON:  05/27/2016 FINDINGS: 0436 hours. Low volume film. The cardio pericardial silhouette is enlarged. Pulmonary vascular congestion with interstitial and patchy basilar a (right greater than left) airspace disease persists without substantial change. Small bilateral pleural effusions noted. The visualized bony structures of the thorax are intact. Telemetry leads overlie the chest. IMPRESSION: Cardiomegaly with vascular congestion and patchy bibasilar airspace disease shows no substantial change. Small bilateral pleural effusions. Electronically Signed   By: Misty Stanley M.D.   On: 05/28/2016 08:38   Dg Chest Port 1 View  Result Date: 05/27/2016 CLINICAL DATA:  Shortness of breath/respiratory failure EXAM: PORTABLE CHEST 1 VIEW COMPARISON:  May 26, 2016 FINDINGS: Central catheter tip is in the superior cava. No pneumothorax. There remains patchy airspace disease throughout the right mid lower lung zones. There is new consolidation in the medial left base. There is cardiomegaly with pulmonary vascularity within normal limits. Patient is status post aortic valve replacement. There is atherosclerotic calcification in the aorta. No adenopathy. No bone lesions. IMPRESSION: New consolidation medial left base. Patchy airspace opacity in the right middle lung zone regions remains. These areas are felt most likely represent multifocal pneumonia. Stable cardiac silhouette. There is aortic atherosclerosis. No evident pneumothorax. Electronically Signed   By: Lowella Grip III M.D.   On: 05/27/2016 08:06    Anti-infectives: Anti-infectives    Start     Dose/Rate Route Frequency Ordered Stop   05/27/16 1400  ceFEPIme (MAXIPIME) 2 g in dextrose 5 % 50 mL IVPB  Status:  Discontinued     2 g 100 mL/hr over 30 Minutes Intravenous Every 24 hours 05/26/16 1314 05/28/16 0834   05/27/16 1400  vancomycin (VANCOCIN) IVPB 1000 mg/200 mL premix  Status:  Discontinued     1,000 mg 200  mL/hr over 60 Minutes Intravenous Every 24 hours 05/26/16 1315 05/28/16 0834   05/26/16 1430  ceFEPIme (MAXIPIME) 2 g in dextrose 5 % 50 mL IVPB     2 g 100 mL/hr over 30 Minutes Intravenous  Once 05/26/16 1251 05/26/16 1556   05/26/16 1330  vancomycin (VANCOCIN) 1,500 mg in sodium chloride 0.9 % 500 mL IVPB     1,500 mg 250 mL/hr over 120 Minutes Intravenous  Once 05/26/16 1251 05/26/16 1534      Assessment/Plan:   S/p Laparoscopic assisted incarcerated ventral incisional hernia repair with Ventralex mesh patch 8cm11/3 Dr. Hassell Done - Sent home on SQ lovenox injections with bridge to coumadin on 11/5 - developed Left rectus sheath hematoma - anemic on arrival (hg 7.0).  Hemoglobin 7.5 yesterday morning.  Up to 10.9 this morning. -PT and INR have now stabilized   CT scan shows increase in rectus sheath and retroperitoneal hematoma Discussed with interventional radiology, who say they have nothing to offer. There is not a good surgical approach to this problem either, since the bleeding is probably secondary to Coumadin anticoagulation superimposed on recent surgery and the site of bleeding is not clear  As such, our only option is to aggressively reverse her anticoagulation and hope for the best. 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 Advance to clears. Continue colace BID.  Advance diet and activities.  PT consult    LOS: 5 days    Kyrielle Urbanski M 05/29/2016

## 2016-05-29 NOTE — Progress Notes (Signed)
Patient Name: Elizabeth Morton Date of Encounter: 05/29/2016  Primary Cardiologist: [facility] Problem List     Principal Problem:   Acute on chronic diastolic congestive heart failure [facility]) Active Problems:   Warfarin anticoagulation   S/P aortic valve replacement   Acute blood loss anemia   S/P repair of ventral hernia Nov 2017   Rectus sheath hematoma, initial encounter   Hypotension   Respiratory failure (HCC)   Abdominal distension     Subjective   Feels well. Had BM. Abdomen only mildly tender. No dyspnea at rest. CT showed extension of hematoma and she received FFP and 5 mg vit K. INR 1.0  Inpatient Medications    Scheduled Meds: . docusate sodium  100 mg Oral BID  . furosemide  40 mg Oral Daily  . levothyroxine  50 mcg Oral QAC breakfast  . mouth rinse  15 mL Mouth Rinse BID  . pantoprazole  40 mg Oral Daily  . polyethylene glycol  17 g Oral Daily  . potassium chloride  20 mEq Oral Q4H   Continuous Infusions:  PRN Meds: acetaminophen, HYDROmorphone (DILAUDID) injection, ipratropium-albuterol, ondansetron **OR** ondansetron (ZOFRAN) IV   Vital Signs    Vitals:   05/29/16 0820 05/29/16 0900 05/29/16 1000 05/29/16 1153  BP:  106/80 (!) 96/58   Pulse: (!) 115 88 88   Resp:  19 14   Temp:    99.2 F (37.3 C)  TempSrc:    Oral  SpO2: 90% 93% 93%   Weight:      Height:        Intake/Output Summary (Last 24 hours) at 05/29/16 1203 Last data filed at 05/29/16 0900  Gross per 24 hour  Intake          1543.38 ml  Output             1315 ml  Net           228.38 ml   Filed Weights   05/28/16 0500 05/28/16 0648 05/29/16 0415  Weight: 169 lb 8.5 oz (76.9 kg) 167 lb 15.9 oz (76.2 kg) 171 lb 4.8 oz (77.7 kg)    Physical Exam   Comfortable, smiling GEN: Well nourished, well developed, in no acute distress.  HEENT: Grossly normal.  Neck: Supple, no JVD, carotid bruits, or masses. Cardiac: RRR, crisp prosthetic valve clicks, 1/6 Ao ejection  murmur, no diastolic murmurs, rubs, or gallops. No clubbing, cyanosis, edema.  Radials/DP/PT 2+ and equal bilaterally.  Respiratory:  Respirations regular and unlabored, clear to auscultation bilaterally. GI: Soft, tender in L flank, nondistended, BS + x 4. MS: no deformity or atrophy. Skin: warm and dry, no rash. Neuro:  Strength and sensation are intact. Psych: AAOx3.  Normal affect.  Labs    CBC  Recent Labs  05/28/16 1825 05/29/16 0412  WBC 11.5* 10.3  HGB 11.1* 10.9*  HCT 32.7* 32.9*  MCV 86.5 86.8  PLT 338 [id number]   Basic Metabolic Panel  Recent Labs  05/27/16 0420  05/28/16 0500  05/28/16 1827 05/29/16 0412  NA 139  < > 142  < > 136 135  K 3.1*  < > 2.3*  < > 3.0* 3.6  CL 103  < > 114*  < > 95* 97*  CO2 25  < > 23  < > 31 29  GLUCOSE 149*  < > 102*  < > 105* 93  BUN 18  < > 14  < > 19 19  CREATININE 1.13*  < >  0.73  < > 0.92 0.91  CALCIUM 8.0*  < > 5.4*  < > 8.6* 8.2*  MG 1.9  --  1.1*  --  3.8*  --   PHOS 3.1  --   --   --   --   --   < > = values in this interval not displayed.   Telemetry    NSR - Personally Reviewed  ECHO    Personally Reviewed, Report pending  Gradients across the AV prosthesis are slightly higher (moderate obstruction at baseline), but this may be related to increased CO/anemia/postop state rateher than true obstruction. Normal LV function and wall motion Equivocal Doppler evidence of elevated mean left atrial pressure, but likely still elevated  Radiology    Ct Abdomen Pelvis Wo Contrast  Result Date: 05/28/2016 CLINICAL DATA:  60 year old female inpatient status post umbilical hernia repair [id number], complicated by large ventral abdominopelvic wall hematoma, presenting for follow-up. EXAM: CT ABDOMEN AND PELVIS WITHOUT CONTRAST TECHNIQUE: Multidetector CT imaging of the abdomen and pelvis was performed following the standard protocol without IV contrast. COMPARISON:  05/24/2016 CT abdomen/pelvis. FINDINGS: Lower chest: Small  layering right greater than left bilateral pleural effusions, new bilaterally. Mild compressive atelectasis in the dependent lower lobes bilaterally. Patchy mild ground-glass attenuation in the lower parahilar lungs bilaterally appears new. Stable top-normal heart size. Partially visualized aortic valve prosthesis is in place. Visualized lower sternotomy wires appear intact. Hepatobiliary: Normal liver with no liver mass. Cholecystectomy. No biliary ductal dilatation. Pancreas: Normal, with no mass or duct dilation. Spleen: Normal size. No mass. Adrenals/Urinary Tract: Normal adrenals. No right renal stones. Punctate nonobstructing 2 mm upper left renal stone. No hydronephrosis. No contour deforming renal mass. Normal caliber ureters. Bladder is completely collapsed by indwelling Foley catheter. No bladder stones or bladder wall thickening. Stomach/Bowel: Stable small to moderate hiatal hernia. Otherwise grossly normal stomach. Normal caliber small bowel with no small bowel wall thickening. Normal appendix. Normal large bowel with no diverticulosis, large bowel wall thickening or pericolonic fat stranding. Vascular/Lymphatic: Atherosclerotic nonaneurysmal abdominal aorta. No pathologically enlarged lymph nodes in the abdomen or pelvis. Reproductive: Grossly normal uterus.  No adnexal mass. Other: No pneumoperitoneum.  No ascites. Large left rectus sheath hematoma measures 9.7 x 4.0 x 14.7 cm, previously 8.6 x 5.0 x 14.7 cm on 05/24/2016 using similar measurement technique, not definitely changed, although with increased layering hyperdense blood products. Large left anterior pelvic extraperitoneal hematoma measures 13.8 x 10.1 x 8.9 cm (series 2/image 69), previously 11.0 x 8.4 x 8.3 cm using similar measurement technique, increased, with increased internal layering hyperdense blood products. New left posterior paranephric space retroperitoneal 4.3 x 4.3 x 6.5 cm hematoma extending superiorly from the  extraperitoneal left anterior pelvic hematoma (series 2/image 49). Midline extraperitoneal ventral abdominal 8.3 x 7.0 x 8.0 cm hematoma with layering hyperdense blood products (series 2/image 43), previously 8.2 x 7.0 x 7.9 cm, not appreciably changed. Deep subcutaneous hematoma in the left supraumbilical ventral abdominal wall with internal gas measures 8.2 x 2.6 x 6.0 cm (series 2/image 44), previously 8.2 x 2.8 by 6.2 cm using similar measurement technique, not appreciably changed. Musculoskeletal: No aggressive appearing focal osseous lesions. Mild thoracolumbar spondylosis . IMPRESSION: 1. Interval growth of large left anterior pelvic extraperitoneal hematoma as described. New left retroperitoneal hematoma extending superiorly from the left anterior pelvic extraperitoneal hematoma. Findings suggest ongoing extraperitoneal bleeding in the left anterior pelvis/ left retroperitoneum. 2. Large left rectus muscle sheath, extraperitoneal ventral midline abdominal and deep subcutaneous  ventral abdominal wall hematomas appear stable. 3. New small bilateral pleural effusions, right greater than left. No ascites. 4. New patchy mild ground-glass attenuation in the lower parahilar lungs bilaterally, which could represent mild pulmonary edema or pneumonitis such as from aspiration. 5. Additional findings include aortic atherosclerosis, punctate nonobstructing left renal stone and small to moderate hiatal hernia. These results were called by telephone at the time of interpretation on 05/28/2016 at 12:11 pm to DR. DAN FEINSTEIN, who verbally acknowledged these results. Electronically Signed   By: Ilona Sorrel M.D.   On: 05/28/2016 12:16   Dg Chest Port 1 View  Result Date: 05/28/2016 CLINICAL DATA:  Acute respiratory failure EXAM: PORTABLE CHEST 1 VIEW COMPARISON:  05/27/2016 FINDINGS: 0436 hours. Low volume film. The cardio pericardial silhouette is enlarged. Pulmonary vascular congestion with interstitial and patchy  basilar a (right greater than left) airspace disease persists without substantial change. Small bilateral pleural effusions noted. The visualized bony structures of the thorax are intact. Telemetry leads overlie the chest. IMPRESSION: Cardiomegaly with vascular congestion and patchy bibasilar airspace disease shows no substantial change. Small bilateral pleural effusions. Electronically Signed   By: Misty Stanley M.D.   On: 05/28/2016 08:38   Patient Profile     60 yo woman with rectus sheath hematoma during enoxaparin-warfarin "bridging" for mechanical AVR following ventral hernia repair, complicated by moderate acute blood loss anemia, mild acute renal insufficiency and acute exacerbation of chronic diastolic heart failure following volume resuscitation. Background problems include CAD s/p CABG and moderately elevated AV prosthesis gradients. CT 05/28/16 showed enlarging hematoma and anticoagulation was reversed with FFP and vit K.  Assessment & Plan    1. CHF: she appears clinically close to euvolemic status, weight is up 2 lb over  05/11/16 office visit ( up 3-4 lb last 24h following additional blood products yesterday).  Daily weights and strict in/out. Hypokalemia resolved. On po diuretics now. 2. Mechanical AVR: INR 1.0. With her type of valve, risk of thrombosis is not extremely high. Will resume heparin without bolus tomorrow, then warfarin+heparin IV until INR<2. It will take longer to achieve anticoagulation since she received vit K, anticipate 5-7 days of IV heparin. I would not give enoxaparin to this patient.. 3. Rectus sheath hematoma: Hgb 11 and stable after last transfusion. 4. ARF: resolved. 5.CAD s/p CABG: no angina. Had chronic widespread ST changes, no different from before. Minimal increase in troponin is not unexpected and can be attributed to demand ischemia, not true acute coronary syndrome.  Signed, Sanda Klein, MD  05/29/2016, 12:03 PM

## 2016-05-29 NOTE — Evaluation (Signed)
Physical Therapy Evaluation Patient Details Name: Elizabeth Morton MRN: [id number] DOB: 06/08/1956 Today's Date: 05/29/2016   History of Present Illness  60 yo admitted with left rectus sheath hematoma 11/12 after ventral hernia repair 11/3. Pt with demand ischemia and SOB since admission. PMHx: CABG, CHF, mech AVR, HTN  Clinical Impression  Pt pleasant and willing to progress mobility to return home. Pt with decreased balance, activity tolerance, gait, and strength compared to baseline who will benefit from acute therapy to maximize mobility, function and activity tolerance to return pt to PLOF. Pt educated for HEP and encouraged to perform throughout the day along with daily mobility and gait with nursing assist.     Follow Up Recommendations No PT follow up    Equipment Recommendations  None recommended by PT    Recommendations for Other Services       Precautions / Restrictions Precautions Precautions: Fall Precaution Comments: watch sats Restrictions Weight Bearing Restrictions: No      Mobility  Bed Mobility               General bed mobility comments: Patient in chair upon arrival  Transfers Overall transfer level: Modified independent (increased time to stand ) Equipment used: None                Ambulation/Gait Ambulation/Gait assistance: Min assist Ambulation Distance (Feet): 300 Feet Assistive device: 1 person hand held assist Gait Pattern/deviations: Step-through pattern Gait velocity: decreased Gait velocity interpretation: Below normal speed for age/gender General Gait Details: slight deviations to right and left while walking   Stairs            Wheelchair Mobility    Modified Rankin (Stroke Patients Only)       Balance Overall balance assessment: Needs assistance   Sitting balance-Leahy Scale: Normal       Standing balance-Leahy Scale: Fair                               Pertinent Vitals/Pain Pain  Assessment: 0-10 Pain Score: 4  Pain Location: back Pain Descriptors / Indicators: Sore Pain Intervention(s): Limited activity within patient's tolerance    Home Living Family/patient expects to be discharged to:: Private residence Living Arrangements: Spouse/significant other (Spouse works, but can be in the home if needed. ) Available Help at Discharge: Family Type of Home: House Home Access: Stairs to enter Entrance Stairs-Rails: Can reach both Entrance Stairs-Number of Steps: 4 Home Layout: One level Home Equipment: None      Prior Function Level of Independence: Independent         Comments: cooking, cleaning, driving, all independet. "sits with" a woman.     Hand Dominance        Extremity/Trunk Assessment   Upper Extremity Assessment: Overall WFL for tasks assessed           Lower Extremity Assessment: Generalized weakness      Cervical / Trunk Assessment: Normal  Communication   Communication: No difficulties  Cognition Arousal/Alertness: Awake/alert Behavior During Therapy: WFL for tasks assessed/performed Overall Cognitive Status: Within Functional Limits for tasks assessed                      General Comments General comments (skin integrity, edema, etc.): Hand held assist during dynamic activities is necessary at this time     Exercises General Exercises - Lower Extremity Long Arc Quad: AROM;Both;15 reps;Seated Hip Flexion/Marching: AROM;Both;15 reps;Seated  Assessment/Plan    PT Assessment Patient needs continued PT services  PT Problem List Decreased strength;Decreased activity tolerance;Decreased balance;Pain;Decreased mobility          PT Treatment Interventions Gait training;Stair training;Functional mobility training;Therapeutic exercise;Patient/family education;Therapeutic activities    PT Goals (Current goals can be found in the Care Plan section)  Acute Rehab PT Goals Patient Stated Goal: return home PT Goal  Formulation: With patient Time For Goal Achievement: 06/12/16 Potential to Achieve Goals: Good    Frequency Min 3X/week   Barriers to discharge Decreased caregiver support      Co-evaluation               End of Session Equipment Utilized During Treatment: Gait belt;Oxygen Activity Tolerance: Patient tolerated treatment well Patient left: in chair;with call bell/phone within reach Nurse Communication: Mobility status         Time: [id number] PT Time Calculation (min) (ACUTE ONLY): 21 min   Charges:   PT Evaluation $PT Eval Moderate Complexity: 1 Procedure     PT G Codes:        Nayden Czajka B Rylan Kaufmann June 12, 2016, 8:28 AM  Elwyn Reach, [place]

## 2016-05-29 NOTE — Progress Notes (Signed)
[facility] Electrolyte Replacement Protocol  Patient Name: Elizabeth Morton DOB: 1955/09/14 MRN: [id number]  Date of Service  05/29/2016   HPI/Events of Note    Recent Labs Lab 05/27/16 0420 05/27/16 1800 05/28/16 0500 05/28/16 0832 05/28/16 1827 05/29/16 0412  NA 139 135 142 137 136 135  K 3.1* 3.3* 2.3* 4.0 3.0* 3.6  CL 103 99* 114* 98* 95* 97*  CO2 25 26 23 31 31 29   GLUCOSE 149* 207* 102* 116* 105* 93  BUN 18 20 14 18 19 19   CREATININE 1.13* 1.18* 0.73 1.05* 0.92 0.91  CALCIUM 8.0* 8.0* 5.4* 8.1* 8.6* 8.2*  MG 1.9  --  1.1*  --  3.8*  --   PHOS 3.1  --   --   --   --   --     Estimated Creatinine Clearance: 64.2 mL/min (by C-G formula based on SCr of 0.91 mg/dL).  Intake/Output      11/16 0701 - 11/17 0700   I.V. (mL/kg) 97.7 (1.3)   Blood 874.8   IV Piggyback 366   Total Intake(mL/kg) 1338.6 (17.2)   Urine (mL/kg/hr) 1590 (0.9)   Stool 0 (0)   Total Output 1590   Net -251.4       Stool Occurrence 1 x    - I/O DETAILED x24h    Total I/O In: 50 [IV Piggyback:50] Out: 440 [Urine:440] - I/O THIS SHIFT    ASSESSMENT   eICURN Interventions  K+ replaced using ICU protocol   ASSESSMENT: [place], Safi Culotta Nicole 05/29/2016, 6:11 AM

## 2016-05-29 NOTE — Progress Notes (Signed)
Patient ID: Elizabeth Morton, female   DOB: 12/19/1955, 60 y.o.   MRN: [id number]  [facility] Surgery Progress Note:   * No surgery found *  Subjective: Mental status is clear.  Patient is sitting up in 2M08 (2100) and is comfortable Objective: Vital signs in last 24 hours: Temp:  [97.2 F (36.2 C)-98.4 F (36.9 C)] 97.4 F (36.3 C) (11/17 0348) Pulse Rate:  [71-100] 92 (11/17 0800) Resp:  [14-35] 20 (11/17 0800) BP: (76-118)/(39-102) 104/61 (11/17 0800) SpO2:  [89 %-100 %] 90 % (11/17 0800) Weight:  [77.7 kg (171 lb 4.8 oz)] 77.7 kg (171 lb 4.8 oz) (11/17 0415)  Intake/Output from previous day: 11/16 0701 - 11/17 0700 In: 1338.6 [I.V.:97.7; Blood:874.8; IV Piggyback:366] Out: 9381 [Urine:1665] Intake/Output this shift: No intake/output data recorded.  Physical Exam: Work of breathing is impaired but not labored.  Abdominal wall hematomas as noted before and seen on the CT scan.    Lab Results:  Results for orders placed or performed during the hospital encounter of 05/24/16 (from the past 48 hour(s))  Basic metabolic panel     Status: Abnormal   Collection Time: 05/27/16  6:00 PM  Result Value Ref Range   Sodium 135 135 - 145 mmol/L   Potassium 3.3 (L) 3.5 - 5.1 mmol/L   Chloride 99 (L) 101 - 111 mmol/L   CO2 26 22 - 32 mmol/L   Glucose, Bld 207 (H) 65 - 99 mg/dL   BUN 20 6 - 20 mg/dL   Creatinine, Ser 1.18 (H) 0.44 - 1.00 mg/dL   Calcium 8.0 (L) 8.9 - 10.3 mg/dL   GFR calc non Af Amer 49 (L) >60 mL/min   GFR calc Af Amer 57 (L) >60 mL/min    Comment: (NOTE) The eGFR has been calculated using the CKD EPI equation. This calculation has not been validated in all clinical situations. eGFR's persistently <60 mL/min signify possible Chronic Kidney Disease.    Anion gap 10 5 - 15  Protime-INR     Status: Abnormal   Collection Time: 05/28/16  5:00 AM  Result Value Ref Range   Prothrombin Time 24.2 (H) 11.4 - 15.2 seconds   INR 2.13   Procalcitonin     Status: None   Collection Time: 05/28/16  5:00 AM  Result Value Ref Range   Procalcitonin 0.19 ng/mL    Comment:        Interpretation: PCT (Procalcitonin) <= 0.5 ng/mL: Systemic infection (sepsis) is not likely. Local bacterial infection is possible. (NOTE)         ICU PCT Algorithm               Non ICU PCT Algorithm    ----------------------------     ------------------------------         PCT < 0.25 ng/mL                 PCT < 0.1 ng/mL     Stopping of antibiotics            Stopping of antibiotics       strongly encouraged.               strongly encouraged.    ----------------------------     ------------------------------       PCT level decrease by               PCT < 0.25 ng/mL       >= 80% from peak PCT  OR PCT 0.25 - 0.5 ng/mL          Stopping of antibiotics                                             encouraged.     Stopping of antibiotics           encouraged.    ----------------------------     ------------------------------       PCT level decrease by              PCT >= 0.25 ng/mL       < 80% from peak PCT        AND PCT >= 0.5 ng/mL            Continuin g antibiotics                                              encouraged.       Continuing antibiotics            encouraged.    ----------------------------     ------------------------------     PCT level increase compared          PCT > 0.5 ng/mL         with peak PCT AND          PCT >= 0.5 ng/mL             Escalation of antibiotics                                          strongly encouraged.      Escalation of antibiotics        strongly encouraged.   Basic metabolic panel     Status: Abnormal   Collection Time: 05/28/16  5:00 AM  Result Value Ref Range   Sodium 142 135 - 145 mmol/L    Comment: DELTA CHECK NOTED NO VISIBLE HEMOLYSIS    Potassium 2.3 (LL) 3.5 - 5.1 mmol/L    Comment: DELTA CHECK NOTED NO VISIBLE HEMOLYSIS CRITICAL RESULT CALLED TO, READ BACK BY AND VERIFIED WITH: R DONAHUE,RN [phone]  WILDERK    Chloride 114 (H) 101 - 111 mmol/L   CO2 23 22 - 32 mmol/L   Glucose, Bld 102 (H) 65 - 99 mg/dL   BUN 14 6 - 20 mg/dL   Creatinine, Ser 0.73 0.44 - 1.00 mg/dL   Calcium 5.4 (LL) 8.9 - 10.3 mg/dL    Comment: DELTA CHECK NOTED CRITICAL RESULT CALLED TO, READ BACK BY AND VERIFIED WITH: R DONAHUE,RN [phone] WILDERK    GFR calc non Af Amer >60 >60 mL/min   GFR calc Af Amer >60 >60 mL/min    Comment: (NOTE) The eGFR has been calculated using the CKD EPI equation. This calculation has not been validated in all clinical situations. eGFR's persistently <60 mL/min signify possible Chronic Kidney Disease.    Anion gap 5 5 - 15  Magnesium     Status: Abnormal   Collection Time: 05/28/16  5:00 AM  Result Value Ref Range   Magnesium 1.1 (L) 1.7 - 2.4 mg/dL  CBC  Status: Abnormal   Collection Time: 05/28/16  5:00 AM  Result Value Ref Range   WBC 11.5 (H) 4.0 - 10.5 K/uL   RBC 2.65 (L) 3.87 - 5.11 MIL/uL   Hemoglobin 7.5 (L) 12.0 - 15.0 g/dL    Comment: REPEATED TO VERIFY SPECIMEN CHECKED FOR CLOTS DELTA CHECK NOTED    HCT 23.2 (L) 36.0 - 46.0 %   MCV 87.5 78.0 - 100.0 fL   MCH 28.3 26.0 - 34.0 pg   MCHC 32.3 30.0 - 36.0 g/dL   RDW 15.5 11.5 - 15.5 %   Platelets 313 150 - 400 K/uL  Brain natriuretic peptide     Status: Abnormal   Collection Time: 05/28/16  5:00 AM  Result Value Ref Range   B Natriuretic Peptide 2,012.1 (H) 0.0 - 100.0 pg/mL  Prepare RBC     Status: None   Collection Time: 05/28/16  6:10 AM  Result Value Ref Range   Order Confirmation ORDER PROCESSED BY BLOOD BANK   Type and screen     Status: None (Preliminary result)   Collection Time: 05/28/16  7:30 AM  Result Value Ref Range   ABO/RH(D) AB POS    Antibody Screen NEG    Sample Expiration 05/31/2016    Unit Number [id number]    Blood Component Type RBC LR PHER1    Unit division 00    Status of Unit ISSUED,FINAL    Transfusion Status OK TO TRANSFUSE    Crossmatch Result Compatible     Unit Number [id number]    Blood Component Type RED CELLS,LR    Unit division 00    Status of Unit ALLOCATED    Transfusion Status OK TO TRANSFUSE    Crossmatch Result Compatible   Basic metabolic panel     Status: Abnormal   Collection Time: 05/28/16  8:32 AM  Result Value Ref Range   Sodium 137 135 - 145 mmol/L   Potassium 4.0 3.5 - 5.1 mmol/L    Comment: DELTA CHECK NOTED NO VISIBLE HEMOLYSIS RESULTS VERIFIED VIA RECOLLECT    Chloride 98 (L) 101 - 111 mmol/L   CO2 31 22 - 32 mmol/L   Glucose, Bld 116 (H) 65 - 99 mg/dL   BUN 18 6 - 20 mg/dL   Creatinine, Ser 1.05 (H) 0.44 - 1.00 mg/dL   Calcium 8.1 (L) 8.9 - 10.3 mg/dL    Comment: DELTA CHECK NOTED RESULTS VERIFIED VIA RECOLLECT    GFR calc non Af Amer 57 (L) >60 mL/min   GFR calc Af Amer >60 >60 mL/min    Comment: (NOTE) The eGFR has been calculated using the CKD EPI equation. This calculation has not been validated in all clinical situations. eGFR's persistently <60 mL/min signify possible Chronic Kidney Disease.    Anion gap 8 5 - 15  Prepare RBC     Status: None   Collection Time: 05/28/16 10:36 AM  Result Value Ref Range   Order Confirmation ORDER PROCESSED BY BLOOD BANK   Prepare fresh frozen plasma     Status: None   Collection Time: 05/28/16 12:16 PM  Result Value Ref Range   Unit Number [id number]    Blood Component Type THAWED PLASMA    Unit division 00    Status of Unit ISSUED,FINAL    Transfusion Status OK TO TRANSFUSE    Unit Number [id number]    Blood Component Type THW PLS APHR    Unit division 00    Status of Unit ISSUED,FINAL  Transfusion Status OK TO TRANSFUSE   CBC     Status: Abnormal   Collection Time: 05/28/16  6:25 PM  Result Value Ref Range   WBC 11.5 (H) 4.0 - 10.5 K/uL   RBC 3.78 (L) 3.87 - 5.11 MIL/uL   Hemoglobin 11.1 (L) 12.0 - 15.0 g/dL    Comment: REPEATED TO VERIFY POST TRANSFUSION SPECIMEN    HCT 32.7 (L) 36.0 - 46.0 %   MCV 86.5 78.0 - 100.0 fL   MCH 29.4 26.0  - 34.0 pg   MCHC 33.9 30.0 - 36.0 g/dL   RDW 15.6 (H) 11.5 - 15.5 %   Platelets 338 150 - 400 K/uL  Protime-INR     Status: Abnormal   Collection Time: 05/28/16  6:25 PM  Result Value Ref Range   Prothrombin Time 17.9 (H) 11.4 - 15.2 seconds   INR 1.28   Basic metabolic panel     Status: Abnormal   Collection Time: 05/28/16  6:27 PM  Result Value Ref Range   Sodium 136 135 - 145 mmol/L   Potassium 3.0 (L) 3.5 - 5.1 mmol/L    Comment: DELTA CHECK NOTED   Chloride 95 (L) 101 - 111 mmol/L   CO2 31 22 - 32 mmol/L   Glucose, Bld 105 (H) 65 - 99 mg/dL   BUN 19 6 - 20 mg/dL   Creatinine, Ser 0.92 0.44 - 1.00 mg/dL   Calcium 8.6 (L) 8.9 - 10.3 mg/dL   GFR calc non Af Amer >60 >60 mL/min   GFR calc Af Amer >60 >60 mL/min    Comment: (NOTE) The eGFR has been calculated using the CKD EPI equation. This calculation has not been validated in all clinical situations. eGFR's persistently <60 mL/min signify possible Chronic Kidney Disease.    Anion gap 10 5 - 15  Magnesium     Status: Abnormal   Collection Time: 05/28/16  6:27 PM  Result Value Ref Range   Magnesium 3.8 (H) 1.7 - 2.4 mg/dL  Protime-INR     Status: None   Collection Time: 05/29/16  4:12 AM  Result Value Ref Range   Prothrombin Time 13.5 11.4 - 15.2 seconds   INR 1.03   CBC     Status: Abnormal   Collection Time: 05/29/16  4:12 AM  Result Value Ref Range   WBC 10.3 4.0 - 10.5 K/uL   RBC 3.79 (L) 3.87 - 5.11 MIL/uL   Hemoglobin 10.9 (L) 12.0 - 15.0 g/dL   HCT 32.9 (L) 36.0 - 46.0 %   MCV 86.8 78.0 - 100.0 fL   MCH 28.8 26.0 - 34.0 pg   MCHC 33.1 30.0 - 36.0 g/dL   RDW 15.9 (H) 11.5 - 15.5 %   Platelets 332 150 - 400 K/uL  Basic metabolic panel     Status: Abnormal   Collection Time: 05/29/16  4:12 AM  Result Value Ref Range   Sodium 135 135 - 145 mmol/L   Potassium 3.6 3.5 - 5.1 mmol/L   Chloride 97 (L) 101 - 111 mmol/L   CO2 29 22 - 32 mmol/L   Glucose, Bld 93 65 - 99 mg/dL   BUN 19 6 - 20 mg/dL   Creatinine,  Ser 0.91 0.44 - 1.00 mg/dL   Calcium 8.2 (L) 8.9 - 10.3 mg/dL   GFR calc non Af Amer >60 >60 mL/min   GFR calc Af Amer >60 >60 mL/min    Comment: (NOTE) The eGFR has been calculated using the CKD  EPI equation. This calculation has not been validated in all clinical situations. eGFR's persistently <60 mL/min signify possible Chronic Kidney Disease.    Anion gap 9 5 - 15    Radiology/Results: Ct Abdomen Pelvis Wo Contrast  Result Date: 05/28/2016 CLINICAL DATA:  60 year old female inpatient status post umbilical hernia repair 70/17/7939, complicated by large ventral abdominopelvic wall hematoma, presenting for follow-up. EXAM: CT ABDOMEN AND PELVIS WITHOUT CONTRAST TECHNIQUE: Multidetector CT imaging of the abdomen and pelvis was performed following the standard protocol without IV contrast. COMPARISON:  05/24/2016 CT abdomen/pelvis. FINDINGS: Lower chest: Small layering right greater than left bilateral pleural effusions, new bilaterally. Mild compressive atelectasis in the dependent lower lobes bilaterally. Patchy mild ground-glass attenuation in the lower parahilar lungs bilaterally appears new. Stable top-normal heart size. Partially visualized aortic valve prosthesis is in place. Visualized lower sternotomy wires appear intact. Hepatobiliary: Normal liver with no liver mass. Cholecystectomy. No biliary ductal dilatation. Pancreas: Normal, with no mass or duct dilation. Spleen: Normal size. No mass. Adrenals/Urinary Tract: Normal adrenals. No right renal stones. Punctate nonobstructing 2 mm upper left renal stone. No hydronephrosis. No contour deforming renal mass. Normal caliber ureters. Bladder is completely collapsed by indwelling Foley catheter. No bladder stones or bladder wall thickening. Stomach/Bowel: Stable small to moderate hiatal hernia. Otherwise grossly normal stomach. Normal caliber small bowel with no small bowel wall thickening. Normal appendix. Normal large bowel with no  diverticulosis, large bowel wall thickening or pericolonic fat stranding. Vascular/Lymphatic: Atherosclerotic nonaneurysmal abdominal aorta. No pathologically enlarged lymph nodes in the abdomen or pelvis. Reproductive: Grossly normal uterus.  No adnexal mass. Other: No pneumoperitoneum.  No ascites. Large left rectus sheath hematoma measures 9.7 x 4.0 x 14.7 cm, previously 8.6 x 5.0 x 14.7 cm on 05/24/2016 using similar measurement technique, not definitely changed, although with increased layering hyperdense blood products. Large left anterior pelvic extraperitoneal hematoma measures 13.8 x 10.1 x 8.9 cm (series 2/image 69), previously 11.0 x 8.4 x 8.3 cm using similar measurement technique, increased, with increased internal layering hyperdense blood products. New left posterior paranephric space retroperitoneal 4.3 x 4.3 x 6.5 cm hematoma extending superiorly from the extraperitoneal left anterior pelvic hematoma (series 2/image 49). Midline extraperitoneal ventral abdominal 8.3 x 7.0 x 8.0 cm hematoma with layering hyperdense blood products (series 2/image 43), previously 8.2 x 7.0 x 7.9 cm, not appreciably changed. Deep subcutaneous hematoma in the left supraumbilical ventral abdominal wall with internal gas measures 8.2 x 2.6 x 6.0 cm (series 2/image 44), previously 8.2 x 2.8 by 6.2 cm using similar measurement technique, not appreciably changed. Musculoskeletal: No aggressive appearing focal osseous lesions. Mild thoracolumbar spondylosis . IMPRESSION: 1. Interval growth of large left anterior pelvic extraperitoneal hematoma as described. New left retroperitoneal hematoma extending superiorly from the left anterior pelvic extraperitoneal hematoma. Findings suggest ongoing extraperitoneal bleeding in the left anterior pelvis/ left retroperitoneum. 2. Large left rectus muscle sheath, extraperitoneal ventral midline abdominal and deep subcutaneous ventral abdominal wall hematomas appear stable. 3. New small  bilateral pleural effusions, right greater than left. No ascites. 4. New patchy mild ground-glass attenuation in the lower parahilar lungs bilaterally, which could represent mild pulmonary edema or pneumonitis such as from aspiration. 5. Additional findings include aortic atherosclerosis, punctate nonobstructing left renal stone and small to moderate hiatal hernia. These results were called by telephone at the time of interpretation on 05/28/2016 at 12:11 pm to DR. DAN FEINSTEIN, who verbally acknowledged these results. Electronically Signed   By: Janina Mayo.D.  On: 05/28/2016 12:16   Dg Chest Port 1 View  Result Date: 05/28/2016 CLINICAL DATA:  Acute respiratory failure EXAM: PORTABLE CHEST 1 VIEW COMPARISON:  05/27/2016 FINDINGS: 0436 hours. Low volume film. The cardio pericardial silhouette is enlarged. Pulmonary vascular congestion with interstitial and patchy basilar a (right greater than left) airspace disease persists without substantial change. Small bilateral pleural effusions noted. The visualized bony structures of the thorax are intact. Telemetry leads overlie the chest. IMPRESSION: Cardiomegaly with vascular congestion and patchy bibasilar airspace disease shows no substantial change. Small bilateral pleural effusions. Electronically Signed   By: Misty Stanley M.D.   On: 05/28/2016 08:38    Anti-infectives: Anti-infectives    Start     Dose/Rate Route Frequency Ordered Stop   05/27/16 1400  ceFEPIme (MAXIPIME) 2 g in dextrose 5 % 50 mL IVPB  Status:  Discontinued     2 g 100 mL/hr over 30 Minutes Intravenous Every 24 hours 05/26/16 1314 05/28/16 0834   05/27/16 1400  vancomycin (VANCOCIN) IVPB 1000 mg/200 mL premix  Status:  Discontinued     1,000 mg 200 mL/hr over 60 Minutes Intravenous Every 24 hours 05/26/16 1315 05/28/16 0834   05/26/16 1430  ceFEPIme (MAXIPIME) 2 g in dextrose 5 % 50 mL IVPB     2 g 100 mL/hr over 30 Minutes Intravenous  Once 05/26/16 1251 05/26/16 1556    05/26/16 1330  vancomycin (VANCOCIN) 1,500 mg in sodium chloride 0.9 % 500 mL IVPB     1,500 mg 250 mL/hr over 120 Minutes Intravenous  Once 05/26/16 1251 05/26/16 1534      Assessment/Plan: Problem List: Patient Active Problem List   Diagnosis Date Noted  . Abdominal distension   . Acute on chronic diastolic congestive heart failure ([place]) 05/26/2016  . Hypotension   . Respiratory failure ([place])   . Rectus sheath hematoma, initial encounter 05/24/2016  . S/P repair of ventral hernia Nov 2017 05/15/2016  . Ventral hernia 05/15/2016  . Lower GI bleed   . Hypokalemia 06/26/2015  . Acute GI bleeding 06/25/2015  . Chronic congestive heart failure with left ventricular diastolic dysfunction ([place]) 06/25/2015  . Hypothyroidism 06/25/2015  . Acute blood loss anemia 06/25/2015  . Long term (current) use of anticoagulants 06/02/2012  . Preop cardiovascular exam   . Pericardial effusion   . CAD (coronary artery disease)   . S/P aortic valve replacement   . Aortic stenosis   . Ejection fraction   . Ecchymosis   . Chronic cholecystitis with calculus 05/25/2012  . Constipation, chronic 05/25/2012  . Needs screening Colonoscopy - Pt refused 05/25/2012  . Warfarin anticoagulation   . Hypertension   . Hx of CABG   . CVA (cerebral infarction)   . Dyslipidemia   . Shortness of breath 04/16/2010    I appreciate the input from the intensivists and cardiology.  At the time of her surgery, she had large lower abdominal wall hematomas from the Lovenox shots that were visible as amorphous black shadows when transilluminating her abdominal wall.  She was monitored in the hospital for several days without significant bleeding and it sounds like she bled about 8 days post op after complaining about the Lovenox shots in her abdomen to the coumadin clinic where she was monitored.  Perhaps rotating these shots to her legs and maybe reducing the dose could have prevented her bleed.  I will not be here to see  her through this event and I hope that she can be gently  anticoagulated so that her valve is not compromised and she can resorb the hematoma that she has.   * No surgery found *    LOS: 5 days   Matt B. Hassell Done, MD, [facility] [phone] beeper [phone]  05/29/2016 8:08 AM

## 2016-05-29 NOTE — Progress Notes (Signed)
PULMONARY / CRITICAL CARE MEDICINE   Name: Elizabeth Morton MRN: [id number] DOB: [birth year]    ADMISSION DATE:  05/24/2016 CONSULTATION DATE:  05/26/2016  REFERRING MD:  CCS  CHIEF COMPLAINT:  Respiratory Distress  Brief:   60yo woman with multiple comorbidities as described below who underwent a lap-assisted ventral hernia repair by Dr. Hassell Done on 11/3. The surgery and her post-op course were uncomplicated. She was discharged on 11/5 on a lovenox bridge and coumadin, which she takes for her mechanical aortic valve. She has had evolving abdominal wall bruising since surgery, but today when she looked in the mirror felt her abdomen to be more swollen, and has had worsening abdominal wall and back pain, particularly on the left abdominal wall. She has nausea and generally felt ill, so presented to the ER for evaluation. She was noted to have a 5-point drop in her hemoglobin since it was last checked 5 days ago. Of note she has still been taking her lovenox shots up to last night. A CT scan reveals multiple abdominal wall hematomas. Left rectus sheath hematoma, related to anticoagulation  and recent surgery resulting in  acute blood loss anemia. Developed chf, pulm edema, lasix drip, shock.  SUBJECTIVE: no venets, off all pressors, walked the entire icu  VITAL SIGNS: BP 104/61 (BP Location: Right Arm)   Pulse (!) 115   Temp 97.4 F (36.3 C) (Oral)   Resp 20   Ht 5\' 2"  (1.575 m)   Wt 77.7 kg (171 lb 4.8 oz)   SpO2 90%   BMI 31.33 kg/m   HEMODYNAMICS: CVP:  [5 mmHg-7 mmHg] 7 mmHg  VENTILATOR SETTINGS:    INTAKE / OUTPUT: I/O last 3 completed shifts: In: 1687.9 [P.O.:120; I.V.:277; Blood:874.8; IV Piggyback:416] Out: 3090 [Urine:3090]  PHYSICAL EXAMINATION: General: Female resting comfortably in chair  Neuro:  A&O x 3, follows commands  HEENT: no JVD or bruits  Cardiovascular: [id number], valve clicks, no MRG Lungs: clear diminished breath sounds  Abdomen: Distended, firm,tender to  touch, BS active, Bruising Extremities: +1 edema to bilaterally lower extremities  Skin: warm, dry, Intact with abdominal incision/ steri strips and bruising  LABS:  BMET  Recent Labs Lab 05/28/16 0832 05/28/16 1827 05/29/16 0412  NA 137 136 135  K 4.0 3.0* 3.6  CL 98* 95* 97*  CO2 31 31 29   BUN 18 19 19   CREATININE 1.05* 0.92 0.91  GLUCOSE 116* 105* 93    Electrolytes  Recent Labs Lab 05/27/16 0420  05/28/16 0500 05/28/16 0832 05/28/16 1827 05/29/16 0412  CALCIUM 8.0*  < > 5.4* 8.1* 8.6* 8.2*  MG 1.9  --  1.1*  --  3.8*  --   PHOS 3.1  --   --   --   --   --   < > = values in this interval not displayed.  CBC  Recent Labs Lab 05/28/16 0500 05/28/16 1825 05/29/16 0412  WBC 11.5* 11.5* 10.3  HGB 7.5* 11.1* 10.9*  HCT 23.2* 32.7* 32.9*  PLT 313 338 332    Coag's  Recent Labs Lab 05/24/16 1251 05/24/16 2111 05/25/16 0720  05/28/16 0500 05/28/16 1825 05/29/16 0412  APTT 49* 48* 41*  --   --   --   --   INR 2.96 2.92 2.04  < > 2.13 1.46 1.03  < > = values in this interval not displayed.  Sepsis Markers  Recent Labs Lab 05/24/16 1318 05/26/16 1331 05/27/16 0420 05/28/16 0500  LATICACIDVEN 3.31*  --   --   --  PROCALCITON  --  0.51 0.46 0.19    ABG  Recent Labs Lab 05/27/16 0541  PHART 7.474*  PCO2ART 36.0  PO2ART 69.0*    Liver Enzymes  Recent Labs Lab 05/24/16 1251  AST 44*  ALT 16  ALKPHOS 74  BILITOT 1.1  ALBUMIN 3.1*    Cardiac Enzymes  Recent Labs Lab 05/25/16 1429 05/25/16 1841  TROPONINI 0.04* 0.16*    Glucose No results for input(s): GLUCAP in the last 168 hours.  Imaging Ct Abdomen Pelvis Wo Contrast  Result Date: 05/28/2016 CLINICAL DATA:  60 year old female inpatient status post umbilical hernia repair [id number], complicated by large ventral abdominopelvic wall hematoma, presenting for follow-up. EXAM: CT ABDOMEN AND PELVIS WITHOUT CONTRAST TECHNIQUE: Multidetector CT imaging of the abdomen and  pelvis was performed following the standard protocol without IV contrast. COMPARISON:  05/24/2016 CT abdomen/pelvis. FINDINGS: Lower chest: Small layering right greater than left bilateral pleural effusions, new bilaterally. Mild compressive atelectasis in the dependent lower lobes bilaterally. Patchy mild ground-glass attenuation in the lower parahilar lungs bilaterally appears new. Stable top-normal heart size. Partially visualized aortic valve prosthesis is in place. Visualized lower sternotomy wires appear intact. Hepatobiliary: Normal liver with no liver mass. Cholecystectomy. No biliary ductal dilatation. Pancreas: Normal, with no mass or duct dilation. Spleen: Normal size. No mass. Adrenals/Urinary Tract: Normal adrenals. No right renal stones. Punctate nonobstructing 2 mm upper left renal stone. No hydronephrosis. No contour deforming renal mass. Normal caliber ureters. Bladder is completely collapsed by indwelling Foley catheter. No bladder stones or bladder wall thickening. Stomach/Bowel: Stable small to moderate hiatal hernia. Otherwise grossly normal stomach. Normal caliber small bowel with no small bowel wall thickening. Normal appendix. Normal large bowel with no diverticulosis, large bowel wall thickening or pericolonic fat stranding. Vascular/Lymphatic: Atherosclerotic nonaneurysmal abdominal aorta. No pathologically enlarged lymph nodes in the abdomen or pelvis. Reproductive: Grossly normal uterus.  No adnexal mass. Other: No pneumoperitoneum.  No ascites. Large left rectus sheath hematoma measures 9.7 x 4.0 x 14.7 cm, previously 8.6 x 5.0 x 14.7 cm on 05/24/2016 using similar measurement technique, not definitely changed, although with increased layering hyperdense blood products. Large left anterior pelvic extraperitoneal hematoma measures 13.8 x 10.1 x 8.9 cm (series 2/image 69), previously 11.0 x 8.4 x 8.3 cm using similar measurement technique, increased, with increased internal layering  hyperdense blood products. New left posterior paranephric space retroperitoneal 4.3 x 4.3 x 6.5 cm hematoma extending superiorly from the extraperitoneal left anterior pelvic hematoma (series 2/image 49). Midline extraperitoneal ventral abdominal 8.3 x 7.0 x 8.0 cm hematoma with layering hyperdense blood products (series 2/image 43), previously 8.2 x 7.0 x 7.9 cm, not appreciably changed. Deep subcutaneous hematoma in the left supraumbilical ventral abdominal wall with internal gas measures 8.2 x 2.6 x 6.0 cm (series 2/image 44), previously 8.2 x 2.8 by 6.2 cm using similar measurement technique, not appreciably changed. Musculoskeletal: No aggressive appearing focal osseous lesions. Mild thoracolumbar spondylosis . IMPRESSION: 1. Interval growth of large left anterior pelvic extraperitoneal hematoma as described. New left retroperitoneal hematoma extending superiorly from the left anterior pelvic extraperitoneal hematoma. Findings suggest ongoing extraperitoneal bleeding in the left anterior pelvis/ left retroperitoneum. 2. Large left rectus muscle sheath, extraperitoneal ventral midline abdominal and deep subcutaneous ventral abdominal wall hematomas appear stable. 3. New small bilateral pleural effusions, right greater than left. No ascites. 4. New patchy mild ground-glass attenuation in the lower parahilar lungs bilaterally, which could represent mild pulmonary edema or pneumonitis such as from aspiration. 5. Additional  findings include aortic atherosclerosis, punctate nonobstructing left renal stone and small to moderate hiatal hernia. These results were called by telephone at the time of interpretation on 05/28/2016 at 12:11 pm to DR. DAN FEINSTEIN, who verbally acknowledged these results. Electronically Signed   By: Ilona Sorrel M.D.   On: 05/28/2016 12:16    STUDIES:  Echo on Oc 27 LVEF 60 to 65% with mean gradient across AV prostheiss of 30 mm Hg (unchanged from 2016), CT Abdomen/Pelvis 05/24/2016:  Multiloculated large hematoma of the anterior abdominal wall extends from the umbilicus down to the symphysis pubis.    CULTURES: Blood 11/14>>Neg Urine 11/14>>Neg  ANTIBIOTICS: Cefepime 11/14 >> 11/17 Vanco 11/14 >> 11/17  SIGNIFICANT EVENTS: 11/3>> Lap Assisted ventral hernia discharged home 11/5 on Lovenox/ Coumadin 11/12>> Re-admitted for multiple abdominal wall hematomas and blood loss anemia ( HGB 7.0) 11/12>> 2 Units PRBC and 2 Units FFP 11/14>> Worsening SOB 11/16- lasix drip remains, neg 1.3 liters  LINES/TUBES: PIV  ASSESSMENT / PLAN:  PULMONARY A: Acute Hypoxic Respiratory Failure Pulmonary interstitial and mild alveolar edema, presumed cardiogenic edema P:   Lasix to neg balance IS PT Mobility > up to chair  ambulated  CARDIOVASCULAR A:  EKG with subendocardial ischemic injury ( ? Ischemic) Mechanical Aortic Valve / chronic coumadin ( INR goal 2-3) AVR  Mean gradient 30 mm HG on preop echo  Hypotension post op  EF 60-65% 04/2016 -Stress dose steroids added 11/14 - dc as cortisol greater then 20  P:  Repeat TTE pending PO Lasix   RENAL A:   Acute renal failure, improving Hypokalemia Hyperchloremia hypomag P:  Follow BMP and UOP Replace electrolytes as needed Avoid saline with Cl  GASTROINTESTINAL A:   Large anterior wall hematoma  Distended, bruised  and tender to mild palpation Nausea P:  Protonix daily as prophylaxis Zofran for nausea prn Colace scheduled  HEMATOLOGIC A:   Blood Loss anemia post hernia repair in setting of chronic anti coagulation ( HGB drop to 7) No obvious signs of bleeding currnetly Anti coagulation on hold  INR 2.79 > 1.03 P:  Trend CBC  Restart coumadin with no Lovenox  Transfuse for HGB < 7 SCD  INFECTIOUS A:   Leukocytosis from bleeds likely Afebrile Neg pct x 2 NOT PNA P:   Monitor Fever and WBC curve   ENDOCRINE A:   Hypothyroidism No AI P:   Synthroid CBG's q 4  Dc  roids  NEUROLOGIC A:   Alert and appropriate P:   Pain control   FAMILY  - Updates: I updated pt  - Inter-disciplinary family meet or Palliative Care meeting due by:  05/31/2016 .   Hayden Pedro, AG-ACNP [facility]  Pgr: [phone]  PCCM Pgr: [phone]  STAFF NOTE: Linwood Dibbles, MD FACP have personally reviewed patient's available data, including medical history, events of note, physical examination and test results as part of my evaluation. I have discussed with resident/NP and other care providers such as pharmacist, RN and RRT. In addition, I personally evaluated patient and elicited key findings of: walked the icu fast, CTA, abdo soft, no change sin abdo examination, received ffp, also given vit K , have some concerns now for coumadin resistance, inr 1.0, no Lovenox, given CT findings of extension retroper, would wait 24 hours further and start heparin drip x 24 hours and observe hct and clinical status, then coumadin next day, dc line neck, lasix to oral, consider to tele, triad as primary after  d.w Dr Bevelyn Buckles. Titus Mould, MD, [place] Pgr: [facility] 05/29/2016 11:12 AM

## 2016-05-29 NOTE — Progress Notes (Signed)
Patient's heart rate up to 140 not sustained.  Pt. Asymptomatic in room.  Will continue to monitor.

## 2016-05-30 DIAGNOSIS — R58 Hemorrhage, not elsewhere classified: Secondary | ICD-10-CM

## 2016-05-30 LAB — CBC
HCT: 34 % — ABNORMAL LOW (ref 36.0–46.0)
HCT: 36.5 % (ref 36.0–46.0)
Hemoglobin: 11.1 g/dL — ABNORMAL LOW (ref 12.0–15.0)
Hemoglobin: 11.7 g/dL — ABNORMAL LOW (ref 12.0–15.0)
MCH: 29.1 pg (ref 26.0–34.0)
MCH: 28.5 pg (ref 26.0–34.0)
MCHC: 32.6 g/dL (ref 30.0–36.0)
MCHC: 32.1 g/dL (ref 30.0–36.0)
MCV: 89 fL (ref 78.0–100.0)
MCV: 89 fL (ref 78.0–100.0)
PLATELETS: 394 10*3/uL (ref 150–400)
Platelets: 348 10*3/uL (ref 150–400)
RBC: 3.82 MIL/uL — ABNORMAL LOW (ref 3.87–5.11)
RBC: 4.1 MIL/uL (ref 3.87–5.11)
RDW: 16 % — AB (ref 11.5–15.5)
RDW: 15.8 % — ABNORMAL HIGH (ref 11.5–15.5)
WBC: 9.7 10*3/uL (ref 4.0–10.5)
WBC: 8.4 10*3/uL (ref 4.0–10.5)

## 2016-05-30 LAB — BASIC METABOLIC PANEL
Anion gap: 9 (ref 5–15)
Anion gap: 8 (ref 5–15)
BUN: 19 mg/dL (ref 6–20)
BUN: 15 mg/dL (ref 6–20)
CALCIUM: 8.4 mg/dL — AB (ref 8.9–10.3)
CO2: 27 mmol/L (ref 22–32)
CO2: 27 mmol/L (ref 22–32)
CREATININE: 0.99 mg/dL (ref 0.44–1.00)
Calcium: 8.6 mg/dL — ABNORMAL LOW (ref 8.9–10.3)
Chloride: 100 mmol/L — ABNORMAL LOW (ref 101–111)
Chloride: 98 mmol/L — ABNORMAL LOW (ref 101–111)
Creatinine, Ser: 1.09 mg/dL — ABNORMAL HIGH (ref 0.44–1.00)
GFR calc Af Amer: >60 mL/min (ref >60)
GFR calc non Af Amer: >60 mL/min (ref >60)
GFR, EST NON AFRICAN AMERICAN: 54 mL/min — AB (ref >60)
Glucose, Bld: 98 mg/dL (ref 65–99)
Glucose, Bld: 101 mg/dL — ABNORMAL HIGH (ref 65–99)
POTASSIUM: 3.6 mmol/L (ref 3.5–5.1)
Potassium: 4.2 mmol/L (ref 3.5–5.1)
SODIUM: 136 mmol/L (ref 135–145)
SODIUM: 133 mmol/L — AB (ref 135–145)

## 2016-05-30 LAB — PROTIME-INR
INR: 1.03
PROTHROMBIN TIME: 13.5 s (ref 11.4–15.2)

## 2016-05-30 MED ORDER — HEPARIN (PORCINE) IN NACL 100-0.45 UNIT/ML-% IJ SOLN
1600.0000 [IU]/h | INTRAMUSCULAR | Status: DC
  Administered 2016-05-30: 900 [IU]/h via INTRAVENOUS
  Administered 2016-05-31: 1250 [IU]/h via INTRAVENOUS
  Administered 2016-06-01: 1600 [IU]/h via INTRAVENOUS
  Filled 2016-05-30 (×2): qty 250

## 2016-05-30 MED ORDER — WARFARIN - PHARMACIST DOSING INPATIENT
Freq: Every day | Status: DC
  Administered 2016-06-01 – 2016-06-05 (×3)

## 2016-05-30 MED ORDER — WARFARIN SODIUM 3 MG PO TABS
3.0000 mg | ORAL_TABLET | Freq: Once | ORAL | Status: AC
  Administered 2016-05-30: 3 mg via ORAL
  Filled 2016-05-30: qty 1

## 2016-05-30 MED ORDER — FUROSEMIDE 40 MG PO TABS
60.0000 mg | ORAL_TABLET | Freq: Every day | ORAL | Status: DC
  Administered 2016-05-31 – 2016-06-02 (×3): 60 mg via ORAL
  Filled 2016-05-30 (×3): qty 1

## 2016-05-30 NOTE — Progress Notes (Signed)
  Patient with mechanical AVR. Admitted with rectus sheath hematoma after ventral hernia repair complicated by blood loss anemia and exacerbation of CHF. She was placed back on IV heparin today. Patient noted dizziness to the nurse. Vital signs are stable. She describes a spinning sensation. No complaints of chest pain or shortness of breath. She denies abdominal pain. Nurse states that her abdomen is soft. Plan: Obtain CBC, BMET Obtain orthostatic vital signs Continue to monitor. Richardson Dopp, PA-C   05/30/2016 4:19 PM

## 2016-05-30 NOTE — Progress Notes (Signed)
Subjective: Stable and alert.  Says she ambulated once yesterday.  Pain well controlled.   Tolerating diet Loose stool   hemoglobin stable.  11.1 this morning.  Creatinine 0.99.  INR 1.03.   seen by Dr. Hassell Done yesterday and I support his comments and recommendations We need to be very careful about resuming her anticoagulation, but it will have to be done due to her mechanical aVR    Objective: Vital signs in last 24 hours: Temp:  [98.1 F (36.7 C)-99.2 F (37.3 C)] 99.2 F (37.3 C) (11/18 0500) Pulse Rate:  [80-115] 84 (11/18 0500) Resp:  [14-23] 19 (11/18 0500) BP: (89-106)/(49-80) 98/49 (11/18 0500) SpO2:  [88 %-98 %] 98 % (11/18 0500) Weight:  [77 kg (169 lb 12.8 oz)-77.4 kg (170 lb 11.2 oz)] 77 kg (169 lb 12.8 oz) (11/18 0500) Last BM Date: 05/29/16  Intake/Output from previous day: 11/17 0701 - 11/18 0700 In: 600 [P.O.:600] Out: 320 [Urine:320] Intake/Output this shift: Total I/O In: 120 [P.O.:120] Out: 320 [Urine:320]  General appearance: Alert.  Friendly.  No distress. Resp: clear to auscultation bilaterally GI: Abdomen soft.  Large ecchymoses and hematoma left abdominal wall but not that tender.  Wounds intact.  Lab Results:   Recent Labs  05/29/16 0412 05/30/16 0340  WBC 10.3 9.7  HGB 10.9* 11.1*  HCT 32.9* 34.0*  PLT 332 348   BMET  Recent Labs  05/29/16 0412 05/30/16 0340  NA 135 136  K 3.6 4.2  CL 97* 100*  CO2 29 27  GLUCOSE 93 98  BUN 19 19  CREATININE 0.91 0.99  CALCIUM 8.2* 8.4*   PT/INR  Recent Labs  05/29/16 0412 05/30/16 0340  LABPROT 13.5 13.5  INR 1.03 1.03   ABG No results for input(s): PHART, HCO3 in the last 72 hours.  Invalid input(s): PCO2, PO2  Studies/Results: Ct Abdomen Pelvis Wo Contrast  Result Date: 05/28/2016 CLINICAL DATA:  60 year old female inpatient status post umbilical hernia repair [id number], complicated by large ventral abdominopelvic wall hematoma, presenting for follow-up. EXAM: CT  ABDOMEN AND PELVIS WITHOUT CONTRAST TECHNIQUE: Multidetector CT imaging of the abdomen and pelvis was performed following the standard protocol without IV contrast. COMPARISON:  05/24/2016 CT abdomen/pelvis. FINDINGS: Lower chest: Small layering right greater than left bilateral pleural effusions, new bilaterally. Mild compressive atelectasis in the dependent lower lobes bilaterally. Patchy mild ground-glass attenuation in the lower parahilar lungs bilaterally appears new. Stable top-normal heart size. Partially visualized aortic valve prosthesis is in place. Visualized lower sternotomy wires appear intact. Hepatobiliary: Normal liver with no liver mass. Cholecystectomy. No biliary ductal dilatation. Pancreas: Normal, with no mass or duct dilation. Spleen: Normal size. No mass. Adrenals/Urinary Tract: Normal adrenals. No right renal stones. Punctate nonobstructing 2 mm upper left renal stone. No hydronephrosis. No contour deforming renal mass. Normal caliber ureters. Bladder is completely collapsed by indwelling Foley catheter. No bladder stones or bladder wall thickening. Stomach/Bowel: Stable small to moderate hiatal hernia. Otherwise grossly normal stomach. Normal caliber small bowel with no small bowel wall thickening. Normal appendix. Normal large bowel with no diverticulosis, large bowel wall thickening or pericolonic fat stranding. Vascular/Lymphatic: Atherosclerotic nonaneurysmal abdominal aorta. No pathologically enlarged lymph nodes in the abdomen or pelvis. Reproductive: Grossly normal uterus.  No adnexal mass. Other: No pneumoperitoneum.  No ascites. Large left rectus sheath hematoma measures 9.7 x 4.0 x 14.7 cm, previously 8.6 x 5.0 x 14.7 cm on 05/24/2016 using similar measurement technique, not definitely changed, although with increased layering hyperdense blood products. Large  left anterior pelvic extraperitoneal hematoma measures 13.8 x 10.1 x 8.9 cm (series 2/image 69), previously 11.0 x 8.4 x 8.3  cm using similar measurement technique, increased, with increased internal layering hyperdense blood products. New left posterior paranephric space retroperitoneal 4.3 x 4.3 x 6.5 cm hematoma extending superiorly from the extraperitoneal left anterior pelvic hematoma (series 2/image 49). Midline extraperitoneal ventral abdominal 8.3 x 7.0 x 8.0 cm hematoma with layering hyperdense blood products (series 2/image 43), previously 8.2 x 7.0 x 7.9 cm, not appreciably changed. Deep subcutaneous hematoma in the left supraumbilical ventral abdominal wall with internal gas measures 8.2 x 2.6 x 6.0 cm (series 2/image 44), previously 8.2 x 2.8 by 6.2 cm using similar measurement technique, not appreciably changed. Musculoskeletal: No aggressive appearing focal osseous lesions. Mild thoracolumbar spondylosis . IMPRESSION: 1. Interval growth of large left anterior pelvic extraperitoneal hematoma as described. New left retroperitoneal hematoma extending superiorly from the left anterior pelvic extraperitoneal hematoma. Findings suggest ongoing extraperitoneal bleeding in the left anterior pelvis/ left retroperitoneum. 2. Large left rectus muscle sheath, extraperitoneal ventral midline abdominal and deep subcutaneous ventral abdominal wall hematomas appear stable. 3. New small bilateral pleural effusions, right greater than left. No ascites. 4. New patchy mild ground-glass attenuation in the lower parahilar lungs bilaterally, which could represent mild pulmonary edema or pneumonitis such as from aspiration. 5. Additional findings include aortic atherosclerosis, punctate nonobstructing left renal stone and small to moderate hiatal hernia. These results were called by telephone at the time of interpretation on 05/28/2016 at 12:11 pm to DR. DAN FEINSTEIN, who verbally acknowledged these results. Electronically Signed   By: Ilona Sorrel M.D.   On: 05/28/2016 12:16    Anti-infectives: Anti-infectives    Start     Dose/Rate Route  Frequency Ordered Stop   05/27/16 1400  ceFEPIme (MAXIPIME) 2 g in dextrose 5 % 50 mL IVPB  Status:  Discontinued     2 g 100 mL/hr over 30 Minutes Intravenous Every 24 hours 05/26/16 1314 05/28/16 0834   05/27/16 1400  vancomycin (VANCOCIN) IVPB 1000 mg/200 mL premix  Status:  Discontinued     1,000 mg 200 mL/hr over 60 Minutes Intravenous Every 24 hours 05/26/16 1315 05/28/16 0834   05/26/16 1430  ceFEPIme (MAXIPIME) 2 g in dextrose 5 % 50 mL IVPB     2 g 100 mL/hr over 30 Minutes Intravenous  Once 05/26/16 1251 05/26/16 1556   05/26/16 1330  vancomycin (VANCOCIN) 1,500 mg in sodium chloride 0.9 % 500 mL IVPB     1,500 mg 250 mL/hr over 120 Minutes Intravenous  Once 05/26/16 1251 05/26/16 1534      Assessment/Plan:   S/p Laparoscopic assisted incarcerated ventral incisional hernia repair with Ventralex mesh patch 8cm11/3 Dr. Hassell Done - Sent home on SQ lovenox injections with bridge to coumadin on 11/5 - developed Left rectus sheath hematoma - anemic on arrival (hg 7.0).  Hemoglobin 7.5 yesterday morning.  Up to 10.9 this morning. 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  PT consult.  Cautious resumption of Coumadin.   LOS: 6 days    Meridith Romick M 05/30/2016

## 2016-05-30 NOTE — Progress Notes (Addendum)
Patient Name: Elizabeth Morton Date of Encounter: 05/30/2016  Primary Cardiologist: [facility] Problem List     Principal Problem:   Acute on chronic diastolic congestive heart failure [facility]) Active Problems:   Warfarin anticoagulation   S/P aortic valve replacement   Acute blood loss anemia   S/P repair of ventral hernia Nov 2017   Rectus sheath hematoma, initial encounter   Hypotension   Respiratory failure (HCC)   Abdominal distension     Subjective   Slightly positive I/O's overnight. Just about 1L positive since admission. On po diuretics. Plan to restart IV heparin today and overlap with warfarin until therapeutic INR of 2 is achieved.  Inpatient Medications    Scheduled Meds: . docusate sodium  100 mg Oral BID  . furosemide  40 mg Oral Daily  . levothyroxine  50 mcg Oral QAC breakfast  . pantoprazole  40 mg Oral Daily  . polyethylene glycol  17 g Oral Daily   Continuous Infusions:  PRN Meds: acetaminophen, HYDROmorphone (DILAUDID) injection, ipratropium-albuterol, ondansetron **OR** ondansetron (ZOFRAN) IV, sodium chloride flush   Vital Signs    Vitals:   05/29/16 1602 05/29/16 2028 05/30/16 0500 05/30/16 0925  BP: (!) 97/53 (!) 92/50 (!) 98/49 (!) 94/47  Pulse: 97 89 84 96  Resp: 20 19 19 20   Temp: 98.2 F (36.8 C) 98.1 F (36.7 C) 99.2 F (37.3 C) 97.8 F (36.6 C)  TempSrc: Oral Oral Oral Oral  SpO2: 95% 98% 98% 96%  Weight: 170 lb 11.2 oz (77.4 kg)  169 lb 12.8 oz (77 kg)   Height:        Intake/Output Summary (Last 24 hours) at 05/30/16 1243 Last data filed at 05/30/16 0540  Gross per 24 hour  Intake              120 ml  Output              320 ml  Net             -200 ml   Filed Weights   05/29/16 0415 05/29/16 1602 05/30/16 0500  Weight: 171 lb 4.8 oz (77.7 kg) 170 lb 11.2 oz (77.4 kg) 169 lb 12.8 oz (77 kg)    Physical Exam   Comfortable, smiling GEN: Well nourished, well developed, in no acute distress.  HEENT: Grossly  normal.  Neck: Supple, no JVD, carotid bruits, or masses. Cardiac: RRR, crisp prosthetic valve clicks, 1/6 Ao ejection murmur, no diastolic murmurs, rubs, or gallops. No clubbing, cyanosis, edema.  Radials/DP/PT 2+ and equal bilaterally.  Respiratory:  Respirations regular and unlabored, clear to auscultation bilaterally. GI: Soft, tender in L flank, nondistended, BS + x 4. MS: no deformity or atrophy. Skin: warm and dry, no rash. Neuro:  Strength and sensation are intact. Psych: AAOx3.  Normal affect.  Labs    CBC  Recent Labs  05/29/16 0412 05/30/16 0340  WBC 10.3 9.7  HGB 10.9* 11.1*  HCT 32.9* 34.0*  MCV 86.8 89.0  PLT 332 0000000   Basic Metabolic Panel  Recent Labs  05/28/16 0500  05/28/16 1827 05/29/16 0412 05/30/16 0340  NA 142  < > 136 135 136  K 2.3*  < > 3.0* 3.6 4.2  CL 114*  < > 95* 97* 100*  CO2 23  < > 31 29 27   GLUCOSE 102*  < > 105* 93 98  BUN 14  < > 19 19 19   CREATININE 0.73  < > 0.92 0.91  0.99  CALCIUM 5.4*  < > 8.6* 8.2* 8.4*  MG 1.1*  --  3.8*  --   --   < > = values in this interval not displayed.   Telemetry    NSR - Personally Reviewed  ECHO    Personally Reviewed, Report pending  Gradients across the AV prosthesis are slightly higher (moderate obstruction at baseline), but this may be related to increased CO/anemia/postop state rateher than true obstruction. Normal LV function and wall motion Equivocal Doppler evidence of elevated mean left atrial pressure, but likely still elevated  Radiology    No results found. Patient Profile     60 yo woman with rectus sheath hematoma during enoxaparin-warfarin "bridging" for mechanical AVR following ventral hernia repair, complicated by moderate acute blood loss anemia, mild acute renal insufficiency and acute exacerbation of chronic diastolic heart failure following volume resuscitation. Background problems include CAD s/p CABG and moderately elevated AV prosthesis gradients. CT 05/28/16  showed enlarging hematoma and anticoagulation was reversed with FFP and vit K.  Assessment & Plan    1. CHF: she appears clinically close to euvolemic status, weight is up 2 lb over  05/11/16 office visit ( up 3-4 lb last 24h following additional blood products yesterday).  Daily weights and strict in/out. Hypokalemia resolved. On po diuretics now. Increase lasix to 60 mg po daily tomorrow. 2. Mechanical AVR: INR 1.0. With her type of valve, risk of thrombosis is not extremely high. Will resume heparin without bolus today along with warfarin until INR>2.0. 3. Rectus sheath hematoma: Hgb 11 and stable after last transfusion. 4. ARF: resolved. 5.CAD s/p CABG: no angina. Had chronic widespread ST changes, no different from before. Minimal increase in troponin is not unexpected and can be attributed to demand ischemia, not true acute coronary syndrome.  Pixie Casino, MD, [facility] Attending Cardiologist [place], MD  05/30/2016, 12:43 PM

## 2016-05-30 NOTE — Progress Notes (Signed)
PROGRESS NOTE    Elizabeth Morton  [id number]  DOB: 25-Nov-1955  DOA: 05/24/2016 PCP: [name], MD Outpatient Specialists:   Hospital course: 60 yo woman with rectus sheath hematoma during enoxaparin-warfarin "bridging" for mechanical AVR following ventral hernia repair, complicated by moderate acute blood loss anemia, mild acute renal insufficiency and acute exacerbation of chronic diastolic heart failure following volume resuscitation. Background problems include CAD s/p CABG and moderately elevated AV prosthesis gradients.  CT 05/28/16 showed enlarging hematoma and anticoagulation was reversed with FFP and vit K.   Assessment & Plan:    Rectus sheath hematoma - hg holding stable, cards to resume anticoagulation today - see orders.   CHF - appears well compensated at this time, monitoring weights, I/Os.    ARF - resolved now.    CAD s/p CABG - appreciated cardiology team assistance, no angina.    Mechanical AVR - resuming anticoagulation as above.    Acute hypoxic respiratory failure - resolved now.   Hypothyroidism - resume levothyroxine.   DVT prophylaxis: heparin/warfarin Code Status: full Family Communication: patient  Disposition Plan: TBD  Consultants:  Ccm  Cardiology  surgery  Subjective: Pt sitting up in bed, no complaints, no bleeding  Objective: Vitals:   05/29/16 1300 05/29/16 1602 05/29/16 2028 05/30/16 0500  BP: 94/66 (!) 97/53 (!) 92/50 (!) 98/49  Pulse: 80 97 89 84  Resp: (!) 22 20 19 19   Temp:  98.2 F (36.8 C) 98.1 F (36.7 C) 99.2 F (37.3 C)  TempSrc:  Oral Oral Oral  SpO2: (!) 88% 95% 98% 98%  Weight:  77.4 kg (170 lb 11.2 oz)  77 kg (169 lb 12.8 oz)  Height:        Intake/Output Summary (Last 24 hours) at 05/30/16 0913 Last data filed at 05/30/16 0540  Gross per 24 hour  Intake              120 ml  Output              320 ml  Net             -200 ml   Filed Weights   05/29/16 0415 05/29/16 1602 05/30/16 0500    Weight: 77.7 kg (171 lb 4.8 oz) 77.4 kg (170 lb 11.2 oz) 77 kg (169 lb 12.8 oz)    Exam:  General exam: sitting up, alert, no distress cooperative Respiratory system: Clear. No increased work of breathing. Cardiovascular system: S1 & S2 heard, with valve click. No JVD, murmurs, gallops, clicks or pedal edema. Gastrointestinal system: Abdomen is nondistended, soft and nontender. Normal bowel sounds heard. Central nervous system: Alert and oriented. No focal neurological deficits. Extremities: no CCE.  Data Reviewed: Basic Metabolic Panel:  Recent Labs Lab 05/27/16 0420  05/28/16 0500 05/28/16 0832 05/28/16 1827 05/29/16 0412 05/30/16 0340  NA 139  < > 142 137 136 135 136  K 3.1*  < > 2.3* 4.0 3.0* 3.6 4.2  CL 103  < > 114* 98* 95* 97* 100*  CO2 25  < > 23 31 31 29 27   GLUCOSE 149*  < > 102* 116* 105* 93 98  BUN 18  < > 14 18 19 19 19   CREATININE 1.13*  < > 0.73 1.05* 0.92 0.91 0.99  CALCIUM 8.0*  < > 5.4* 8.1* 8.6* 8.2* 8.4*  MG 1.9  --  1.1*  --  3.8*  --   --   PHOS 3.1  --   --   --   --   --   --   < > =  values in this interval not displayed. Liver Function Tests:  Recent Labs Lab 05/24/16 1251  AST 44*  ALT 16  ALKPHOS 74  BILITOT 1.1  PROT 5.9*  ALBUMIN 3.1*   No results for input(s): LIPASE, AMYLASE in the last 168 hours. No results for input(s): AMMONIA in the last 168 hours. CBC:  Recent Labs Lab 05/24/16 1251  05/27/16 0420 05/28/16 0500 05/28/16 1825 05/29/16 0412 05/30/16 0340  WBC 14.3*  < > 17.0* 11.5* 11.5* 10.3 9.7  NEUTROABS 12.0*  --   --   --   --   --   --   HGB 7.0*  < > 9.8* 7.5* 11.1* 10.9* 11.1*  HCT 22.3*  < > 29.5* 23.2* 32.7* 32.9* 34.0*  MCV 86.4  < > 87.3 87.5 86.5 86.8 89.0  PLT 449*  < > 349 313 338 332 348  < > = values in this interval not displayed. Cardiac Enzymes:  Recent Labs Lab 05/25/16 1429 05/25/16 1841  CKTOTAL 54  --   CKMB 15.0*  --   TROPONINI 0.04* 0.16*   CBG (last 3)  No results for input(s):  GLUCAP in the last 72 hours. Recent Results (from the past 240 hour(s))  MRSA PCR Screening     Status: None   Collection Time: 05/25/16  5:56 AM  Result Value Ref Range Status   MRSA by PCR NEGATIVE NEGATIVE Final    Comment:        The GeneXpert MRSA Assay (FDA approved for NASAL specimens only), is one component of a comprehensive MRSA colonization surveillance program. It is not intended to diagnose MRSA infection nor to guide or monitor treatment for MRSA infections.   Culture, blood (routine x 2)     Status: None (Preliminary result)   Collection Time: 05/26/16 11:19 AM  Result Value Ref Range Status   Specimen Description BLOOD LEFT ARM  Final   Special Requests BOTTLES DRAWN AEROBIC ONLY 5CC  Final   Culture NO GROWTH 3 DAYS  Final   Report Status PENDING  Incomplete  Culture, blood (routine x 2)     Status: None (Preliminary result)   Collection Time: 05/26/16 11:25 AM  Result Value Ref Range Status   Specimen Description BLOOD LEFT ARM  Final   Special Requests BOTTLES DRAWN AEROBIC ONLY 5CC  Final   Culture NO GROWTH 3 DAYS  Final   Report Status PENDING  Incomplete  Culture, Urine     Status: None   Collection Time: 05/26/16  2:09 PM  Result Value Ref Range Status   Specimen Description URINE, CATHETERIZED  Final   Special Requests Normal  Final   Culture NO GROWTH  Final   Report Status 05/27/2016 FINAL  Final     Studies: Ct Abdomen Pelvis Wo Contrast  Result Date: 05/28/2016 CLINICAL DATA:  60 year old female inpatient status post umbilical hernia repair [id number], complicated by large ventral abdominopelvic wall hematoma, presenting for follow-up. EXAM: CT ABDOMEN AND PELVIS WITHOUT CONTRAST TECHNIQUE: Multidetector CT imaging of the abdomen and pelvis was performed following the standard protocol without IV contrast. COMPARISON:  05/24/2016 CT abdomen/pelvis. FINDINGS: Lower chest: Small layering right greater than left bilateral pleural effusions, new  bilaterally. Mild compressive atelectasis in the dependent lower lobes bilaterally. Patchy mild ground-glass attenuation in the lower parahilar lungs bilaterally appears new. Stable top-normal heart size. Partially visualized aortic valve prosthesis is in place. Visualized lower sternotomy wires appear intact. Hepatobiliary: Normal liver with no liver mass. Cholecystectomy. No biliary  ductal dilatation. Pancreas: Normal, with no mass or duct dilation. Spleen: Normal size. No mass. Adrenals/Urinary Tract: Normal adrenals. No right renal stones. Punctate nonobstructing 2 mm upper left renal stone. No hydronephrosis. No contour deforming renal mass. Normal caliber ureters. Bladder is completely collapsed by indwelling Foley catheter. No bladder stones or bladder wall thickening. Stomach/Bowel: Stable small to moderate hiatal hernia. Otherwise grossly normal stomach. Normal caliber small bowel with no small bowel wall thickening. Normal appendix. Normal large bowel with no diverticulosis, large bowel wall thickening or pericolonic fat stranding. Vascular/Lymphatic: Atherosclerotic nonaneurysmal abdominal aorta. No pathologically enlarged lymph nodes in the abdomen or pelvis. Reproductive: Grossly normal uterus.  No adnexal mass. Other: No pneumoperitoneum.  No ascites. Large left rectus sheath hematoma measures 9.7 x 4.0 x 14.7 cm, previously 8.6 x 5.0 x 14.7 cm on 05/24/2016 using similar measurement technique, not definitely changed, although with increased layering hyperdense blood products. Large left anterior pelvic extraperitoneal hematoma measures 13.8 x 10.1 x 8.9 cm (series 2/image 69), previously 11.0 x 8.4 x 8.3 cm using similar measurement technique, increased, with increased internal layering hyperdense blood products. New left posterior paranephric space retroperitoneal 4.3 x 4.3 x 6.5 cm hematoma extending superiorly from the extraperitoneal left anterior pelvic hematoma (series 2/image 49). Midline  extraperitoneal ventral abdominal 8.3 x 7.0 x 8.0 cm hematoma with layering hyperdense blood products (series 2/image 43), previously 8.2 x 7.0 x 7.9 cm, not appreciably changed. Deep subcutaneous hematoma in the left supraumbilical ventral abdominal wall with internal gas measures 8.2 x 2.6 x 6.0 cm (series 2/image 44), previously 8.2 x 2.8 by 6.2 cm using similar measurement technique, not appreciably changed. Musculoskeletal: No aggressive appearing focal osseous lesions. Mild thoracolumbar spondylosis . IMPRESSION: 1. Interval growth of large left anterior pelvic extraperitoneal hematoma as described. New left retroperitoneal hematoma extending superiorly from the left anterior pelvic extraperitoneal hematoma. Findings suggest ongoing extraperitoneal bleeding in the left anterior pelvis/ left retroperitoneum. 2. Large left rectus muscle sheath, extraperitoneal ventral midline abdominal and deep subcutaneous ventral abdominal wall hematomas appear stable. 3. New small bilateral pleural effusions, right greater than left. No ascites. 4. New patchy mild ground-glass attenuation in the lower parahilar lungs bilaterally, which could represent mild pulmonary edema or pneumonitis such as from aspiration. 5. Additional findings include aortic atherosclerosis, punctate nonobstructing left renal stone and small to moderate hiatal hernia. These results were called by telephone at the time of interpretation on 05/28/2016 at 12:11 pm to DR. DAN FEINSTEIN, who verbally acknowledged these results. Electronically Signed   By: Ilona Sorrel M.D.   On: 05/28/2016 12:16     Scheduled Meds: . docusate sodium  100 mg Oral BID  . furosemide  40 mg Oral Daily  . levothyroxine  50 mcg Oral QAC breakfast  . pantoprazole  40 mg Oral Daily  . polyethylene glycol  17 g Oral Daily   Continuous Infusions:  Principal Problem:   Acute on chronic diastolic congestive heart failure (HCC) Active Problems:   Warfarin  anticoagulation   S/P aortic valve replacement   Acute blood loss anemia   S/P repair of ventral hernia Nov 2017   Rectus sheath hematoma, initial encounter   Hypotension   Respiratory failure ([place])   Abdominal distension  Time spent:   Irwin Brakeman, MD, FAAFP [facility] Pager [phone]  If 7PM-7AM, please contact night-coverage [email or web address] Password [email or web address] 05/30/2016, 9:13 AM    LOS: 6 days

## 2016-05-30 NOTE — Progress Notes (Signed)
  Patient with mechanical AVR, admitted with rectus sheath hematoma after hernia repair complicated by blood loss anemia and exacerbation of Chf. Heparin restarted today. Patient reported dizziness earlier today. CBC, orthostatics, and BMP checked and no abnormalities. Symptoms have resolved. Recommend restarting heparin and monitoring closely  Soyla Murphy, MD 05/30/16 5:53 PM

## 2016-05-30 NOTE — Progress Notes (Signed)
Heparin gtt. Started per order around 1430, 10-15 minutes after called to the room patient c/o dizziness, feel like the room is spinning, denies abdominal pain, cp pt. Look pale weak and restless, v/s at baseline, pharmacy and PA notified. Lab and ortho BP order. Ortho bp neg, Card fellow notified.  MD instruct to re start the heparin gtt., patient is been doing well since the heparin re start at 1756. Pharmacy notified to adjust the heparin level time. Will continue to monitor the patient.

## 2016-05-30 NOTE — Progress Notes (Signed)
[place] for heparin / warfarin  Indication: Mechanical AVR  No Known Allergies  Patient Measurements: Height: 5\' 2"  (157.5 cm) Weight: 169 lb 12.8 oz (77 kg) (Scale B) IBW/kg (Calculated) : 50.1 Heparin Dosing Weight: 67 kg  Vital Signs: Temp: 98.7 F (37.1 C) (11/18 1200) Temp Source: Oral (11/18 1200) BP: 93/48 (11/18 1200) Pulse Rate: 87 (11/18 1200)  Labs:  Recent Labs  05/28/16 1825 05/28/16 1827 05/29/16 0412 05/30/16 0340  HGB 11.1*  --  10.9* 11.1*  HCT 32.7*  --  32.9* 34.0*  PLT 338  --  332 348  LABPROT 17.9*  --  13.5 13.5  INR 1.46  --  1.03 1.03  CREATININE  --  0.92 0.91 0.99    Estimated Creatinine Clearance: 58.8 mL/min (by C-G formula based on SCr of 0.99 mg/dL).   Medical History: Past Medical History:  Diagnosis Date  . Aortic stenosis    Aortic valve replacement 2006  . CAD (coronary artery disease)    a. s/p CABG 2006 at time of AVR.  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 Dyslipidemia   . Gallstones   . GERD (gastroesophageal reflux disease)   . Heart murmur   . Hx of CABG    2006,LIMA to LAD, SVG to diagonal  . Hx of transfusion of packed red blood cells   . Hypertension   . Hypothyroidism   . Iron deficiency anemia   . Lower GI bleed 06/2015  . Pericardial effusion    Insignificant small pericardial effusion seen on echo, November, 2013  . Peripheral vascular disease ([place]) 06   blood clot -stroke  . S/P aortic valve replacement    a. Bentall procedure,#21 St[employer] Jude mechanical valve conduit with reimplantation of the coronaries 2006  . Status post colonoscopy with polypectomy    "bleeding; colonoscopy was ~ 06/18/2015"  . Stroke [facility]) 2006   no deficits   . Warfarin anticoagulation    coumadin therapy     Assessment: 60 yo female with mechanical AVR, she was bridged perioperatively with Lovenox as an outpatient but developed multiple abdominal  wall/rectus sheath hematomas and had a 5 g drop in her hemoglobin. She has received multiple units of pRBC and FFP throughout her admission. Pt is out of the ICU and was stable enough today to resume anticoagulation. All anticoag had been held since she was admitted on 11/12. INR 1, hgb 11.1, plts wnl.  PTA warfarin: 4.5 mg qFri, 3 mg all other days  Goal of Therapy:  Heparin level 0.3-0.7 units/ml Monitor platelets by anticoagulation protocol: Yes INR 2-3  Plan:  -Heparin 900 units/hr -Warfarin 3 mg po x1 -Daily INR, CBC, HL -Watch closely for s/sx bleeding -Stop heparin gtt when INR >2   [name] 05/30/2016,1:29 PM

## 2016-05-31 LAB — HEPARIN LEVEL (UNFRACTIONATED)
HEPARIN UNFRACTIONATED: 0.24 [IU]/mL — AB (ref 0.30–0.70)
Heparin Unfractionated: 0.1 IU/mL — ABNORMAL LOW (ref 0.30–0.70)
Heparin Unfractionated: 0.19 IU/mL — ABNORMAL LOW (ref 0.30–0.70)

## 2016-05-31 LAB — CULTURE, BLOOD (ROUTINE X 2)
Culture: NO GROWTH
Culture: NO GROWTH

## 2016-05-31 LAB — BASIC METABOLIC PANEL
ANION GAP: 9 (ref 5–15)
BUN: 15 mg/dL (ref 6–20)
CO2: 25 mmol/L (ref 22–32)
Calcium: 8.5 mg/dL — ABNORMAL LOW (ref 8.9–10.3)
Chloride: 99 mmol/L — ABNORMAL LOW (ref 101–111)
Creatinine, Ser: 1 mg/dL (ref 0.44–1.00)
GFR calc Af Amer: >60 mL/min (ref >60)
GLUCOSE: 116 mg/dL — AB (ref 65–99)
POTASSIUM: 3.3 mmol/L — AB (ref 3.5–5.1)
Sodium: 133 mmol/L — ABNORMAL LOW (ref 135–145)

## 2016-05-31 LAB — CBC
HEMATOCRIT: 34 % — AB (ref 36.0–46.0)
Hemoglobin: 11.1 g/dL — ABNORMAL LOW (ref 12.0–15.0)
MCH: 29 pg (ref 26.0–34.0)
MCHC: 32.6 g/dL (ref 30.0–36.0)
MCV: 88.8 fL (ref 78.0–100.0)
PLATELETS: 379 10*3/uL (ref 150–400)
RBC: 3.83 MIL/uL — AB (ref 3.87–5.11)
RDW: 16.1 % — ABNORMAL HIGH (ref 11.5–15.5)
WBC: 8.4 10*3/uL (ref 4.0–10.5)

## 2016-05-31 LAB — PROTIME-INR
INR: 0.98
Prothrombin Time: 13 seconds (ref 11.4–15.2)

## 2016-05-31 MED ORDER — POTASSIUM CHLORIDE CRYS ER 20 MEQ PO TBCR
20.0000 meq | EXTENDED_RELEASE_TABLET | Freq: Two times a day (BID) | ORAL | Status: DC
  Administered 2016-05-31 – 2016-06-06 (×13): 20 meq via ORAL
  Filled 2016-05-31 (×13): qty 1

## 2016-05-31 MED ORDER — WARFARIN SODIUM 3 MG PO TABS
3.0000 mg | ORAL_TABLET | Freq: Once | ORAL | Status: AC
  Administered 2016-05-31: 3 mg via ORAL

## 2016-05-31 MED ORDER — POTASSIUM CHLORIDE CRYS ER 20 MEQ PO TBCR
20.0000 meq | EXTENDED_RELEASE_TABLET | Freq: Once | ORAL | Status: AC
  Administered 2016-05-31: 20 meq via ORAL
  Filled 2016-05-31: qty 1

## 2016-05-31 MED ORDER — WARFARIN SODIUM 3 MG PO TABS
3.0000 mg | ORAL_TABLET | Freq: Once | ORAL | Status: DC
  Filled 2016-05-31: qty 1

## 2016-05-31 NOTE — Progress Notes (Signed)
Patient alert and oriented, still c/o pain to the back, pt. Taking tylenol for  the pain. Ambulate patient in hall way  From the room to nursing station x 2 only c/o leg  weakness. Will continue to monitor the patient.

## 2016-05-31 NOTE — Progress Notes (Signed)
ANTICOAGULATION CONSULT NOTE - Follow Up Consult  Pharmacy Consult for heparin Indication: AVR  Labs:  Recent Labs  05/29/16 0412 05/30/16 0340 05/30/16 1635 05/31/16 0337  HGB 10.9* 11.1* 11.7* 11.1*  HCT 32.9* 34.0* 36.5 34.0*  PLT 332 348 394 379  LABPROT 13.5 13.5  --  13.0  INR 1.03 1.03  --  0.98  HEPARINUNFRC  --   --   --  0.10*  CREATININE 0.91 0.99 1.09* 1.00    Assessment: 60yo female subtherapeutic on heparin with initial dosing after Coumadin had been on hold.  Goal of Therapy:  Heparin level 0.3-0.7 units/ml   Plan:  Will increase heparin gtt by 3 units/kg/hr to 1100 units/hr and check level in 6hr.  Wynona Neat, PharmD, BCPS  05/31/2016,5:37 AM

## 2016-05-31 NOTE — Progress Notes (Signed)
Patient refused use of bed alarm. Explained fall risk precautions and rational of bed alarm. Patient stated she would not get up or walk without using call bell and requesting assistance.

## 2016-05-31 NOTE — Progress Notes (Signed)
[place] for heparin / warfarin  Indication: Mechanical AVR  No Known Allergies  Patient Measurements: Height: 5\' 2"  (157.5 cm) Weight: 168 lb 14.4 oz (76.6 kg) (Scale B) IBW/kg (Calculated) : 50.1 Heparin Dosing Weight: 67 kg  Vital Signs: Temp: 98.6 F (37 C) (11/19 1300) Temp Source: Oral (11/19 1300) BP: 99/58 (11/19 2044) Pulse Rate: 77 (11/19 2044)  Labs:  Recent Labs  05/29/16 0412 05/30/16 0340 05/30/16 1635 05/31/16 0337 05/31/16 1216 05/31/16 2130  HGB 10.9* 11.1* 11.7* 11.1*  --   --   HCT 32.9* 34.0* 36.5 34.0*  --   --   PLT 332 348 394 379  --   --   LABPROT 13.5 13.5  --  13.0  --   --   INR 1.03 1.03  --  0.98  --   --   HEPARINUNFRC  --   --   --  0.10* 0.24* 0.19*  CREATININE 0.91 0.99 1.09* 1.00  --   --     Estimated Creatinine Clearance: 58 mL/min (by C-G formula based on SCr of 1 mg/dL).   Medical History: Past Medical History:  Diagnosis Date  . Aortic stenosis    Aortic valve replacement 2006  . CAD (coronary artery disease)    a. s/p CABG 2006 at time of AVR.  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 Dyslipidemia   . Gallstones   . GERD (gastroesophageal reflux disease)   . Heart murmur   . Hx of CABG    2006,LIMA to LAD, SVG to diagonal  . Hx of transfusion of packed red blood cells   . Hypertension   . Hypothyroidism   . Iron deficiency anemia   . Lower GI bleed 06/2015  . Pericardial effusion    Insignificant small pericardial effusion seen on echo, November, 2013  . Peripheral vascular disease ([place]) 06   blood clot -stroke  . S/P aortic valve replacement    a. Bentall procedure,#21 St. Jude mechanical valve conduit with reimplantation of the coronaries 2006  . Status post colonoscopy with polypectomy    "bleeding; colonoscopy was ~ 06/18/2015"  . Stroke [facility]) 2006   no deficits   . Warfarin anticoagulation    coumadin therapy      Assessment: 60 yo female with mechanical AVR, she was bridged perioperatively with Lovenox as an outpatient but developed multiple abdominal wall/rectus sheath hematomas and had a 5 g drop in her hemoglobin. She has received multiple units of pRBC and FFP throughout her admission.All anticoagulation was resumed 11/18 All anticoag had been held since she was admitted on 11/12. INR 0.98, plts wnl, hgb 11.1. Heparin level reamins subtherapeutic despite most recent rate increase, discussed with RN, everything okay with infusion.  PTA warfarin: 4.5 mg qFri, 3 mg all other days  Goal of Therapy:  Heparin level 0.3-0.7 units/ml Monitor platelets by anticoagulation protocol: Yes INR 2-3  Plan:  -Increase heparin to 1450 units/hr -Daily INR, CBC, HL -Watch closely for s/sx bleeding -Discontinue heparin when INR 2  Vincenza Hews, PharmD, BCPS 05/31/2016, 11:36 PM Pager: [phone]

## 2016-05-31 NOTE — Progress Notes (Signed)
Patient Name: Elizabeth Morton Date of Encounter: 05/31/2016  Primary Cardiologist: [facility] Problem List     Principal Problem:   Acute on chronic diastolic congestive heart failure [facility]) Active Problems:   Warfarin anticoagulation   S/P aortic valve replacement   Acute blood loss anemia   S/P repair of ventral hernia Nov 2017   Rectus sheath hematoma, initial encounter   Hypotension   Acute respiratory failure (HCC)   Abdominal distension   Bleeding     Subjective   Dizziness overnight - not likely related to heparin. It was held and restarted. Also on warfarin overlap, otherwise asymptomatic. INR 0.98 today - need to overcome Vit K with warfarin dosing.  Inpatient Medications    Scheduled Meds: . docusate sodium  100 mg Oral BID  . furosemide  60 mg Oral Daily  . levothyroxine  50 mcg Oral QAC breakfast  . pantoprazole  40 mg Oral Daily  . polyethylene glycol  17 g Oral Daily  . potassium chloride  20 mEq Oral BID  . Warfarin - Pharmacist Dosing Inpatient   Does not apply q1800   Continuous Infusions: . heparin 1,100 Units/hr (05/31/16 0537)   PRN Meds: acetaminophen, HYDROmorphone (DILAUDID) injection, ipratropium-albuterol, ondansetron **OR** ondansetron (ZOFRAN) IV, sodium chloride flush   Vital Signs    Vitals:   05/30/16 1504 05/30/16 1508 05/30/16 2234 05/31/16 0450  BP: (!) 100/49 (!) 99/53 (!) 96/52 (!) 94/46  Pulse: 92 93 71 77  Resp:   18 18  Temp:   98.3 F (36.8 C) 98.7 F (37.1 C)  TempSrc:   Oral Oral  SpO2:   91% 92%  Weight:    168 lb 14.4 oz (76.6 kg)  Height:        Intake/Output Summary (Last 24 hours) at 05/31/16 1148 Last data filed at 05/31/16 1107  Gross per 24 hour  Intake          1077.77 ml  Output             1126 ml  Net           -48.23 ml   Filed Weights   05/29/16 1602 05/30/16 0500 05/31/16 0450  Weight: 170 lb 11.2 oz (77.4 kg) 169 lb 12.8 oz (77 kg) 168 lb 14.4 oz (76.6 kg)    Physical Exam   Comfortable, smiling GEN: Well nourished, well developed, in no acute distress.  HEENT: Grossly normal.  Neck: Supple, no JVD, carotid bruits, or masses. Cardiac: RRR, crisp prosthetic valve clicks, 1/6 Ao ejection murmur, no diastolic murmurs, rubs, or gallops. No clubbing, cyanosis, edema.  Radials/DP/PT 2+ and equal bilaterally.  Respiratory:  Respirations regular and unlabored, clear to auscultation bilaterally. GI: Soft, tender in L flank, nondistended, BS + x 4. MS: no deformity or atrophy. Skin: warm and dry, no rash. Neuro:  Strength and sensation are intact. Psych: AAOx3.  Normal affect.  Labs    CBC  Recent Labs  05/30/16 1635 05/31/16 0337  WBC 8.4 8.4  HGB 11.7* 11.1*  HCT 36.5 34.0*  MCV 89.0 88.8  PLT 394 [id number]   Basic Metabolic Panel  Recent Labs  05/28/16 1827  05/30/16 1635 05/31/16 0337  NA 136  < > 133* 133*  K 3.0*  < > 3.6 3.3*  CL 95*  < > 98* 99*  CO2 31  < > 27 25  GLUCOSE 105*  < > 101* 116*  BUN 19  < > 15 15  CREATININE 0.92  < > 1.09* 1.00  CALCIUM 8.6*  < > 8.6* 8.5*  MG 3.8*  --   --   --   < > = values in this interval not displayed.   Telemetry    NSR - Personally Reviewed  ECHO    Personally Reviewed, Report pending  Gradients across the AV prosthesis are slightly higher (moderate obstruction at baseline), but this may be related to increased CO/anemia/postop state rateher than true obstruction. Normal LV function and wall motion Equivocal Doppler evidence of elevated mean left atrial pressure, but likely still elevated  Radiology    No results found. Patient Profile     60 yo woman with rectus sheath hematoma during enoxaparin-warfarin "bridging" for mechanical AVR following ventral hernia repair, complicated by moderate acute blood loss anemia, mild acute renal insufficiency and acute exacerbation of chronic diastolic heart failure following volume resuscitation. Background problems include CAD s/p CABG and moderately  elevated AV prosthesis gradients. CT 05/28/16 showed enlarging hematoma and anticoagulation was reversed with FFP and vit K.  Assessment & Plan    1. CHF: she appears clinically close to euvolemic status, weight is up 2 lb over  05/11/16 office visit ( up 3-4 lb last 24h following additional blood products yesterday).  Daily weights and strict in/out. Hypokalemia resolved. On po diuretics, slightly negative overnight. Monitor for orthostatic symptoms. If dizziness recurs, decrease lasix back to 40 mg daily. 2. Mechanical AVR: INR 1.0. With her type of valve, risk of thrombosis is not extremely high. On IV heparin without bolus and warfarin until INR>2.0. 3. Rectus sheath hematoma: Hgb 11 and stable after last transfusion. 4. ARF: resolved. 5.CAD s/p CABG: no angina. Had chronic widespread ST changes, no different from before. Minimal increase in troponin is not unexpected and can be attributed to demand ischemia, not true acute coronary syndrome.  Able to be discharged from a cardiology standpoint when INR >2.  Pixie Casino, MD, [facility] Attending Cardiologist [place], MD  05/31/2016, 11:48 AM

## 2016-05-31 NOTE — Progress Notes (Signed)
Patient Ambulated in [place] from room to nurses station and back. Patient used walker to assist due to generalized weakness. Patient tolerated well. Once she was seated back in bed  she did report legs felt weak and burned/ tingled. She has some mild shortness of breath towards upon returning to room. She stated she would rest until meal arrived at lunch.

## 2016-05-31 NOTE — Progress Notes (Signed)
PROGRESS NOTE    Elizabeth Morton  [id number]  DOB: 1955/12/17  DOA: 05/24/2016 PCP: [name], MD Outpatient Specialists:   Hospital course: 60 yo woman with rectus sheath hematoma during enoxaparin-warfarin "bridging" for mechanical AVR following ventral hernia repair, complicated by moderate acute blood loss anemia, mild acute renal insufficiency and acute exacerbation of chronic diastolic heart failure following volume resuscitation. Background problems include CAD s/p CABG and moderately elevated AV prosthesis gradients.  CT 05/28/16 showed enlarging hematoma and anticoagulation was reversed with FFP and vit K.   Assessment & Plan:    Rectus sheath hematoma - hg holding stable, started on IV heparin and oral warfarin INR 0.98 - see orders.   CHF - appears well compensated at this time, monitoring weights, I/Os.    ARF - resolved now.    CAD s/p CABG - appreciated cardiology team assistance, no angina.    Mechanical AVR - resuming anticoagulation as above.     Acute hypoxic respiratory failure - resolved now.   Hypothyroidism - resume levothyroxine.   DVT prophylaxis: heparin/warfarin Code Status: full Family Communication: patient  Disposition Plan: Home when INR>2 per cardiology  Consultants:  Ccm  Cardiology  surgery  Subjective: Pt reports no bleeding complications, seems to be tolerating heparin infusion  Objective: Vitals:   05/30/16 1508 05/30/16 2234 05/31/16 0450 05/31/16 1300  BP: (!) 99/53 (!) 96/52 (!) 94/46 (!) 96/51  Pulse: 93 71 77 76  Resp:  18 18 18   Temp:  98.3 F (36.8 C) 98.7 F (37.1 C) 98.6 F (37 C)  TempSrc:  Oral Oral Oral  SpO2:  91% 92% 93%  Weight:   76.6 kg (168 lb 14.4 oz)   Height:        Intake/Output Summary (Last 24 hours) at 05/31/16 1421 Last data filed at 05/31/16 1300  Gross per 24 hour  Intake           881.77 ml  Output             1477 ml  Net          -595.23 ml   Filed Weights   05/29/16  1602 05/30/16 0500 05/31/16 0450  Weight: 77.4 kg (170 lb 11.2 oz) 77 kg (169 lb 12.8 oz) 76.6 kg (168 lb 14.4 oz)    Exam:  General exam: sitting up, alert, no distress cooperative Respiratory system: Clear. No increased work of breathing. Cardiovascular system: S1 & S2 heard, with valve click. No JVD, murmurs, gallops, clicks or pedal edema. Gastrointestinal system: Abdomen is nondistended, soft and nontender. Normal bowel sounds heard. Central nervous system: Alert and oriented. No focal neurological deficits. Extremities: no CCE.  Data Reviewed: Basic Metabolic Panel:  Recent Labs Lab 05/27/16 0420  05/28/16 0500  05/28/16 1827 05/29/16 0412 05/30/16 0340 05/30/16 1635 05/31/16 0337  NA 139  < > 142  < > 136 135 136 133* 133*  K 3.1*  < > 2.3*  < > 3.0* 3.6 4.2 3.6 3.3*  CL 103  < > 114*  < > 95* 97* 100* 98* 99*  CO2 25  < > 23  < > 31 29 27 27 25   GLUCOSE 149*  < > 102*  < > 105* 93 98 101* 116*  BUN 18  < > 14  < > 19 19 19 15 15   CREATININE 1.13*  < > 0.73  < > 0.92 0.91 0.99 1.09* 1.00  CALCIUM 8.0*  < > 5.4*  < >  8.6* 8.2* 8.4* 8.6* 8.5*  MG 1.9  --  1.1*  --  3.8*  --   --   --   --   PHOS 3.1  --   --   --   --   --   --   --   --   < > = values in this interval not displayed. Liver Function Tests: No results for input(s): AST, ALT, ALKPHOS, BILITOT, PROT, ALBUMIN in the last 168 hours. No results for input(s): LIPASE, AMYLASE in the last 168 hours. No results for input(s): AMMONIA in the last 168 hours. CBC:  Recent Labs Lab 05/28/16 1825 05/29/16 0412 05/30/16 0340 05/30/16 1635 05/31/16 0337  WBC 11.5* 10.3 9.7 8.4 8.4  HGB 11.1* 10.9* 11.1* 11.7* 11.1*  HCT 32.7* 32.9* 34.0* 36.5 34.0*  MCV 86.5 86.8 89.0 89.0 88.8  PLT 338 332 348 394 379   Cardiac Enzymes:  Recent Labs Lab 05/25/16 1429 05/25/16 1841  CKTOTAL 54  --   CKMB 15.0*  --   TROPONINI 0.04* 0.16*   CBG (last 3)  No results for input(s): GLUCAP in the last 72 hours. Recent  Results (from the past 240 hour(s))  MRSA PCR Screening     Status: None   Collection Time: 05/25/16  5:56 AM  Result Value Ref Range Status   MRSA by PCR NEGATIVE NEGATIVE Final    Comment:        The GeneXpert MRSA Assay (FDA approved for NASAL specimens only), is one component of a comprehensive MRSA colonization surveillance program. It is not intended to diagnose MRSA infection nor to guide or monitor treatment for MRSA infections.   Culture, blood (routine x 2)     Status: None (Preliminary result)   Collection Time: 05/26/16 11:19 AM  Result Value Ref Range Status   Specimen Description BLOOD LEFT ARM  Final   Special Requests BOTTLES DRAWN AEROBIC ONLY 5CC  Final   Culture NO GROWTH 4 DAYS  Final   Report Status PENDING  Incomplete  Culture, blood (routine x 2)     Status: None (Preliminary result)   Collection Time: 05/26/16 11:25 AM  Result Value Ref Range Status   Specimen Description BLOOD LEFT ARM  Final   Special Requests BOTTLES DRAWN AEROBIC ONLY 5CC  Final   Culture NO GROWTH 4 DAYS  Final   Report Status PENDING  Incomplete  Culture, Urine     Status: None   Collection Time: 05/26/16  2:09 PM  Result Value Ref Range Status   Specimen Description URINE, CATHETERIZED  Final   Special Requests Normal  Final   Culture NO GROWTH  Final   Report Status 05/27/2016 FINAL  Final     Studies: No results found.  Scheduled Meds: . docusate sodium  100 mg Oral BID  . furosemide  60 mg Oral Daily  . levothyroxine  50 mcg Oral QAC breakfast  . pantoprazole  40 mg Oral Daily  . polyethylene glycol  17 g Oral Daily  . potassium chloride  20 mEq Oral BID  . Warfarin - Pharmacist Dosing Inpatient   Does not apply q1800   Continuous Infusions: . heparin 1,100 Units/hr (05/31/16 1000)   Principal Problem:   Acute on chronic diastolic congestive heart failure (HCC) Active Problems:   Warfarin anticoagulation   S/P aortic valve replacement   Acute blood loss  anemia   S/P repair of ventral hernia Nov 2017   Rectus sheath hematoma, initial encounter  Hypotension   Acute respiratory failure (HCC)   Abdominal distension   Bleeding  Time spent:   Irwin Brakeman, MD, FAAFP [facility] Pager [phone]  If 7PM-7AM, please contact night-coverage [email or web address] Password [email or web address] 05/31/2016, 2:21 PM    LOS: 7 days

## 2016-05-31 NOTE — Progress Notes (Signed)
[place] for heparin / warfarin  Indication: Mechanical AVR  No Known Allergies  Patient Measurements: Height: 5\' 2"  (157.5 cm) Weight: 168 lb 14.4 oz (76.6 kg) (Scale B) IBW/kg (Calculated) : 50.1 Heparin Dosing Weight: 67 kg  Vital Signs: Temp: 98.6 F (37 C) (11/19 1300) Temp Source: Oral (11/19 1300) BP: 96/51 (11/19 1300) Pulse Rate: 76 (11/19 1300)  Labs:  Recent Labs  05/29/16 0412 05/30/16 0340 05/30/16 1635 05/31/16 0337 05/31/16 1216  HGB 10.9* 11.1* 11.7* 11.1*  --   HCT 32.9* 34.0* 36.5 34.0*  --   PLT 332 348 394 379  --   LABPROT 13.5 13.5  --  13.0  --   INR 1.03 1.03  --  0.98  --   HEPARINUNFRC  --   --   --  0.10* 0.24*  CREATININE 0.91 0.99 1.09* 1.00  --     Estimated Creatinine Clearance: 58 mL/min (by C-G formula based on SCr of 1 mg/dL).   Medical History: Past Medical History:  Diagnosis Date  . Aortic stenosis    Aortic valve replacement 2006  . CAD (coronary artery disease)    a. s/p CABG 2006 at time of AVR.  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 Dyslipidemia   . Gallstones   . GERD (gastroesophageal reflux disease)   . Heart murmur   . Hx of CABG    2006,LIMA to LAD, SVG to diagonal  . Hx of transfusion of packed red blood cells   . Hypertension   . Hypothyroidism   . Iron deficiency anemia   . Lower GI bleed 06/2015  . Pericardial effusion    Insignificant small pericardial effusion seen on echo, November, 2013  . Peripheral vascular disease ([place]) 06   blood clot -stroke  . S/P aortic valve replacement    a. Bentall procedure,#21 St. Jude mechanical valve conduit with reimplantation of the coronaries 2006  . Status post colonoscopy with polypectomy    "bleeding; colonoscopy was ~ 06/18/2015"  . Stroke [facility]) 2006   no deficits   . Warfarin anticoagulation    coumadin therapy     Assessment: 60 yo female with mechanical AVR, she was  bridged perioperatively with Lovenox as an outpatient but developed multiple abdominal wall/rectus sheath hematomas and had a 5 g drop in her hemoglobin. She has received multiple units of pRBC and FFP throughout her admission.All anticoagulation was resumed 11/18 All anticoag had been held since she was admitted on 11/12. INR 0.98, plts wnl, hgb 11.1. HL subtherapeutic, discussed with RN, everything okay with infusion.  PTA warfarin: 4.5 mg qFri, 3 mg all other days  Goal of Therapy:  Heparin level 0.3-0.7 units/ml Monitor platelets by anticoagulation protocol: Yes INR 2-3  Plan:  -Increase heparin to 1250 units/hr -Warfarin 3 mg po x1 -Daily INR, CBC, HL -Watch closely for s/sx bleeding -Discontinue heparin when INR 2   Elizabeth Morton 05/31/2016,3:28 PM

## 2016-05-31 NOTE — Progress Notes (Signed)
Subjective: Stable and alert. Not much pain Tolerating diet Small loose stools Slow to ambulate  Heparin drip and Coumadin started yesterday mechanical aortic valve No sign of bleeding.  Hemoglobin stable at 11.1.  INR 0.98.  Objective: Vital signs in last 24 hours: Temp:  [97.8 F (36.6 C)-98.7 F (37.1 C)] 98.7 F (37.1 C) (11/19 0450) Pulse Rate:  [71-96] 77 (11/19 0450) Resp:  [18-20] 18 (11/19 0450) BP: (93-103)/(46-71) 94/46 (11/19 0450) SpO2:  [91 %-96 %] 92 % (11/19 0450) Weight:  [76.6 kg (168 lb 14.4 oz)] 76.6 kg (168 lb 14.4 oz) (11/19 0450) Last BM Date: 05/30/16  Intake/Output from previous day: 11/18 0701 - 11/19 0700 In: 837.8 [P.O.:720; I.V.:117.8] Out: 1302 [Urine:1300; Stool:2] Intake/Output this shift: No intake/output data recorded.    EXAM: General appearance: Alert.  Friendly.  No distress.  Slightly depressed affect this morning. Resp: clear to auscultation bilaterally GI: Abdomen soft.  Large ecchymoses and hematoma left abdominal wall but not that tender.   seems firm and unchanged.  Wounds intact.   Lab Results:   Recent Labs  05/30/16 1635 05/31/16 0337  WBC 8.4 8.4  HGB 11.7* 11.1*  HCT 36.5 34.0*  PLT 394 379   BMET  Recent Labs  05/30/16 1635 05/31/16 0337  NA 133* 133*  K 3.6 3.3*  CL 98* 99*  CO2 27 25  GLUCOSE 101* 116*  BUN 15 15  CREATININE 1.09* 1.00  CALCIUM 8.6* 8.5*   PT/INR  Recent Labs  05/30/16 0340 05/31/16 0337  LABPROT 13.5 13.0  INR 1.03 0.98   ABG No results for input(s): PHART, HCO3 in the last 72 hours.  Invalid input(s): PCO2, PO2  Studies/Results: No results found.  Anti-infectives: Anti-infectives    Start     Dose/Rate Route Frequency Ordered Stop   05/27/16 1400  ceFEPIme (MAXIPIME) 2 g in dextrose 5 % 50 mL IVPB  Status:  Discontinued     2 g 100 mL/hr over 30 Minutes Intravenous Every 24 hours 05/26/16 1314 05/28/16 0834   05/27/16 1400  vancomycin (VANCOCIN) IVPB 1000  mg/200 mL premix  Status:  Discontinued     1,000 mg 200 mL/hr over 60 Minutes Intravenous Every 24 hours 05/26/16 1315 05/28/16 0834   05/26/16 1430  ceFEPIme (MAXIPIME) 2 g in dextrose 5 % 50 mL IVPB     2 g 100 mL/hr over 30 Minutes Intravenous  Once 05/26/16 1251 05/26/16 1556   05/26/16 1330  vancomycin (VANCOCIN) 1,500 mg in sodium chloride 0.9 % 500 mL IVPB     1,500 mg 250 mL/hr over 120 Minutes Intravenous  Once 05/26/16 1251 05/26/16 1534      Assessment/Plan:  S/p Laparoscopic assisted incarcerated ventral incisional hernia repair with Ventralex mesh patch 8cm11/3 Dr. Hassell Done - Sent home on SQ lovenox injections with bridge to coumadin on 11/5 - developed Left rectus sheath hematoma - anemic on arrival (hg 7.0).Hemoglobin 7.5 yesterday morning. Up to 10.9 this morning. -PT and INR have now stabilized -No evidence of bleeding for the past 60 hours -Due to presence of mechanical aVR, I support slow, cautious, resumption of anticoagulation without Lovenox  Diet as tolerated Increase mobilization PT consult  From a general surgical standpoint, she meets discharge criteria and could be discharged home.  However, she will likely need to remain hospitalized for supervision of her anticoagulation resumption. When discharged she needs to follow-up with Dr. Johnathan Hausen in 2 weeks.    Abnormal troponin and EKG - chronic widespread  ST changes similar to before, cardiology suspects demand ischemia CHF Acute hypoxic respiratory failure AS  CAD s/p CABG x2 - on chronic anticoagulation at home Mechanical AVR  HTN HL H/o CVA elated to thrombosed aortic root aneurysm GERD  ID - Cefepime 11/14 >>, anco 11/14 >> FEN - clears  Plan - soft diet.               PT consult.               Cautious resumption of Coumadin.             Home when okay with cardiology             Follow-up Dr. Johnathan Hausen 2 weeks    LOS: 7 days    Apolonio Cutting M 05/31/2016

## 2016-06-01 LAB — CBC
HCT: 35.4 % — ABNORMAL LOW (ref 36.0–46.0)
Hemoglobin: 11.5 g/dL — ABNORMAL LOW (ref 12.0–15.0)
MCH: 29 pg (ref 26.0–34.0)
MCHC: 32.5 g/dL (ref 30.0–36.0)
MCV: 89.4 fL (ref 78.0–100.0)
PLATELETS: 386 10*3/uL (ref 150–400)
RBC: 3.96 MIL/uL (ref 3.87–5.11)
RDW: 15.9 % — ABNORMAL HIGH (ref 11.5–15.5)
WBC: 7.6 10*3/uL (ref 4.0–10.5)

## 2016-06-01 LAB — BASIC METABOLIC PANEL
Anion gap: 10 (ref 5–15)
BUN: 14 mg/dL (ref 6–20)
CALCIUM: 9.2 mg/dL (ref 8.9–10.3)
CO2: 25 mmol/L (ref 22–32)
CREATININE: 1 mg/dL (ref 0.44–1.00)
Chloride: 100 mmol/L — ABNORMAL LOW (ref 101–111)
GFR calc Af Amer: >60 mL/min (ref >60)
Glucose, Bld: 74 mg/dL (ref 65–99)
POTASSIUM: 4.4 mmol/L (ref 3.5–5.1)
SODIUM: 135 mmol/L (ref 135–145)

## 2016-06-01 LAB — TYPE AND SCREEN
ABO/RH(D): AB POS
ANTIBODY SCREEN: NEGATIVE
Unit division: 0
Unit division: 0

## 2016-06-01 LAB — PROTIME-INR
INR: 1.07
PROTHROMBIN TIME: 13.9 s (ref 11.4–15.2)

## 2016-06-01 LAB — HEPARIN LEVEL (UNFRACTIONATED)
HEPARIN UNFRACTIONATED: 0.26 [IU]/mL — AB (ref 0.30–0.70)
HEPARIN UNFRACTIONATED: 0.62 [IU]/mL (ref 0.30–0.70)

## 2016-06-01 MED ORDER — WARFARIN SODIUM 5 MG PO TABS
5.0000 mg | ORAL_TABLET | Freq: Once | ORAL | Status: AC
  Administered 2016-06-01: 5 mg via ORAL
  Filled 2016-06-01: qty 1

## 2016-06-01 MED ORDER — HEPARIN (PORCINE) IN NACL 100-0.45 UNIT/ML-% IJ SOLN
1700.0000 [IU]/h | INTRAMUSCULAR | Status: DC
  Administered 2016-06-01: 1550 [IU]/h via INTRAVENOUS
  Administered 2016-06-03: 1600 [IU]/h via INTRAVENOUS
  Filled 2016-06-01 (×2): qty 250

## 2016-06-01 NOTE — Progress Notes (Signed)
[place] for heparin / warfarin  Indication: Mechanical AVR  No Known Allergies  Patient Measurements: Height: 5\' 2"  (157.5 cm) Weight: 171 lb 3.2 oz (77.7 kg) (scale b) IBW/kg (Calculated) : 50.1 Heparin Dosing Weight: 67 kg  Vital Signs: Temp: 97.8 F (36.6 C) (11/20 0356) Temp Source: Oral (11/20 0356) BP: 98/50 (11/20 0945) Pulse Rate: 81 (11/20 0356)  Labs:  Recent Labs  05/30/16 0340 05/30/16 1635  05/31/16 0337 05/31/16 1216 05/31/16 2130 06/01/16 0444 06/01/16 0951  HGB 11.1* 11.7*  --  11.1*  --   --  11.5*  --   HCT 34.0* 36.5  --  34.0*  --   --  35.4*  --   PLT 348 394  --  379  --   --  386  --   LABPROT 13.5  --   --  13.0  --   --  13.9  --   INR 1.03  --   --  0.98  --   --  1.07  --   HEPARINUNFRC  --   --   < > 0.10* 0.24* 0.19*  --  0.26*  CREATININE 0.99 1.09*  --  1.00  --   --   --   --   < > = values in this interval not displayed.  Estimated Creatinine Clearance: 58.4 mL/min (by C-G formula based on SCr of 1 mg/dL).   Medical History: Past Medical History:  Diagnosis Date  . Aortic stenosis    Aortic valve replacement 2006  . CAD (coronary artery disease)    a. s/p CABG 2006 at time of AVR.  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 Dyslipidemia   . Gallstones   . GERD (gastroesophageal reflux disease)   . Heart murmur   . Hx of CABG    2006,LIMA to LAD, SVG to diagonal  . Hx of transfusion of packed red blood cells   . Hypertension   . Hypothyroidism   . Iron deficiency anemia   . Lower GI bleed 06/2015  . Pericardial effusion    Insignificant small pericardial effusion seen on echo, November, 2013  . Peripheral vascular disease ([place]) 06   blood clot -stroke  . S/P aortic valve replacement    a. Bentall procedure,#21 St. Jude mechanical valve conduit with reimplantation of the coronaries 2006  . Status post colonoscopy with polypectomy    "bleeding;  colonoscopy was ~ 06/18/2015"  . Stroke [facility]) 2006   no deficits   . Warfarin anticoagulation    coumadin therapy     Assessment: 60 yo female with mechanical AVR, she was bridged perioperatively with Lovenox as an outpatient but developed multiple abdominal wall/rectus sheath hematomas and had a 5 g drop in her hemoglobin. She has received multiple units of pRBC and FFP throughout her admission.All anticoagulation was resumed 11/18 All anticoag had been held since she was admitted on 11/12.  INR today = 1, Heparin level increasing  PTA warfarin: 4.5 mg qFri, 3 mg all other days  Goal of Therapy:  Heparin level 0.3-0.7 units/ml Monitor platelets by anticoagulation protocol: Yes INR 2-3  Plan:  -Increase heparin to 1600 units/hr -Coumadin 5 mg po x 1 today -8 hour heparin level -Daily INR, CBC, HL -Watch closely for s/sx bleeding -Discontinue heparin when INR 2  Thank you Anette Guarneri, PharmD [phone] 06/01/2016, 10:38 AM

## 2016-06-01 NOTE — Progress Notes (Signed)
Patient Name: Elizabeth Morton Date of Encounter: 06/01/2016  Primary Cardiologist: [facility] Problem List     Principal Problem:   Acute on chronic diastolic congestive heart failure [facility]) Active Problems:   Warfarin anticoagulation   S/P aortic valve replacement   Acute blood loss anemia   S/P repair of ventral hernia Nov 2017   Rectus sheath hematoma, initial encounter   Hypotension   Acute respiratory failure (HCC)   Abdominal distension   Bleeding    Subjective   Feeling well this morning, does report some generalized weakness. Also on warfarin overlap, otherwise asymptomatic. INR 1.07 today - need to overcome Vit K with warfarin dosing.  Inpatient Medications    Scheduled Meds: . docusate sodium  100 mg Oral BID  . furosemide  60 mg Oral Daily  . levothyroxine  50 mcg Oral QAC breakfast  . pantoprazole  40 mg Oral Daily  . polyethylene glycol  17 g Oral Daily  . potassium chloride  20 mEq Oral BID  . Warfarin - Pharmacist Dosing Inpatient   Does not apply q1800   Continuous Infusions: . heparin 1,450 Units/hr (05/31/16 2349)   PRN Meds: acetaminophen, HYDROmorphone (DILAUDID) injection, ipratropium-albuterol, ondansetron **OR** ondansetron (ZOFRAN) IV, sodium chloride flush   Vital Signs    Vitals:   05/31/16 1300 05/31/16 2044 06/01/16 0356 06/01/16 0945  BP: (!) 96/51 (!) 99/58 (!) 104/52 (!) 98/50  Pulse: 76 77 81   Resp: 18 18 18    Temp: 98.6 F (37 C)  97.8 F (36.6 C)   TempSrc: Oral  Oral   SpO2: 93% 95% 96%   Weight:   171 lb 3.2 oz (77.7 kg)   Height:        Intake/Output Summary (Last 24 hours) at 06/01/16 1030 Last data filed at 06/01/16 0838  Gross per 24 hour  Intake          3233.86 ml  Output             1126 ml  Net          2107.86 ml   Filed Weights   05/30/16 0500 05/31/16 0450 06/01/16 0356  Weight: 169 lb 12.8 oz (77 kg) 168 lb 14.4 oz (76.6 kg) 171 lb 3.2 oz (77.7 kg)    Physical Exam   Comfortable,  smiling GEN: Well nourished, well developed, in no acute distress.  HEENT: Grossly normal.  Neck: Supple, no JVD, carotid bruits, or masses. Cardiac: RRR, crisp prosthetic valve clicks, 1/6 Ao ejection murmur, no diastolic murmurs, rubs, or gallops. No clubbing, cyanosis, edema.  Radials/DP/PT 2+ and equal bilaterally.  Respiratory:  Respirations regular and unlabored, clear to auscultation bilaterally. GI: Soft, tender in L flank, nondistended, BS + x 4. MS: no deformity or atrophy. Skin: warm and dry, no rash. Neuro:  Strength and sensation are intact. Psych: AAOx3.  Normal affect.  Labs    CBC  Recent Labs  05/31/16 0337 06/01/16 0444  WBC 8.4 7.6  HGB 11.1* 11.5*  HCT 34.0* 35.4*  MCV 88.8 89.4  PLT 379 [id number]   Basic Metabolic Panel  Recent Labs  05/30/16 1635 05/31/16 0337  NA 133* 133*  K 3.6 3.3*  CL 98* 99*  CO2 27 25  GLUCOSE 101* 116*  BUN 15 15  CREATININE 1.09* 1.00  CALCIUM 8.6* 8.5*     Telemetry    NSR - Personally Reviewed  ECHO    TTE: 05/29/16  Study Conclusions  - Procedure  narrative: Transthoracic echocardiography. Image   quality was adequate. The study was technically difficult. - Left ventricle: The cavity size was normal. Wall thickness was   increased in a pattern of mild LVH. Systolic function was normal.   The estimated ejection fraction was in the range of 55% to 60%.   Features are consistent with a pseudonormal left ventricular   filling pattern, with concomitant abnormal relaxation and   increased filling pressure (grade 2 diastolic dysfunction). - Aortic valve: A bioprosthesis was present. There was mild   regurgitation. - Mitral valve: There was mild regurgitation. - Pulmonary arteries: Systolic pressure was mildly increased. PA   peak pressure: 38 mm Hg (S).  Radiology    No results found.   Patient Profile     60 yo woman with rectus sheath hematoma during enoxaparin-warfarin "bridging" for mechanical AVR  following ventral hernia repair, complicated by moderate acute blood loss anemia, mild acute renal insufficiency and acute exacerbation of chronic diastolic heart failure following volume resuscitation. Background problems include CAD s/p CABG and moderately elevated AV prosthesis gradients. CT 05/28/16 showed enlarging hematoma and anticoagulation was reversed with FFP and vit K.  Assessment & Plan    1. CHF: she appears clinically close to euvolemic status, weight is up 2 lb over  05/11/16 office visit ( up 3-4 lb last 24h following additional blood products in recent days).  Daily weights and strict in/out.  On po diuretics, slightly negative overnight. Monitor for orthostatic symptoms. If dizziness recurs, decrease lasix back to 40 mg daily.  2. Mechanical AVR: INR 1.0. With her type of valve, risk of thrombosis is not extremely high. On IV heparin without bolus and warfarin until INR>2.0.  3. Rectus sheath hematoma: Hgb 11 and stable after last transfusion.  4. ARF: resolved.  5.CAD s/p CABG: no angina. Had chronic widespread ST changes, no different from before. Minimal increase in troponin is not unexpected and can be attributed to demand ischemia, not true acute coronary syndrome.  6. Hypokalemia: Potassium 3.3 yesterday. Check BMET today.   Able to be discharged from a cardiology standpoint when INR >2.  Reino Bellis, NP-C 06/01/2016, 59:72 AM   60-year-old female with history of mechanical aortic valve, who was admitted with rectus sheath hematoma after bridging with enoxaparin and warfarin combination requiring use of FFP's and vitamin K. Hemoglobin is stable at 11.2 after the last transfusion, she's been restarted on Coumadin today's INR 1.0, we'll continue on heparin, discharge once INR or 2. Replace potassium.  Ena Dawley 06/01/2016

## 2016-06-01 NOTE — Progress Notes (Signed)
Physical Therapy Treatment Patient Details Name: Elizabeth Morton MRN: [id number] DOB: 07-Jul-1956 Today's Date: 2016/06/26    History of Present Illness 60 yo admitted with left rectus sheath hematoma 11/12 after ventral hernia repair 11/3. Pt with demand ischemia and SOB since admission. PMHx: CABG, CHF, mech AVR, HTN    PT Comments    Progressing well with mobility, reports generalized fatigue in BLEs. Current POC remains appropriate. Will see for HEP and stair negotiation prior to acute sign off.   Follow Up Recommendations  No PT follow up     Equipment Recommendations  None recommended by PT    Recommendations for Other Services       Precautions / Restrictions Precautions Precautions: Fall Precaution Comments: watch sats Restrictions Weight Bearing Restrictions: No    Mobility  Bed Mobility Overal bed mobility: Modified Independent             General bed mobility comments: increased time to perform, no physical assist required  Transfers Overall transfer level: Modified independent (increased time to stand ) Equipment used: None                Ambulation/Gait Ambulation/Gait assistance: Supervision Ambulation Distance (Feet): 240 Feet Assistive device: 1 person hand held assist Gait Pattern/deviations: Step-through pattern;Decreased stride length Gait velocity: decreased Gait velocity interpretation: Below normal speed for age/gender General Gait Details: modest instability due to LE weakness    Stairs            Wheelchair Mobility    Modified Rankin (Stroke Patients Only)       Balance Overall balance assessment: Needs assistance   Sitting balance-Leahy Scale: Normal     Standing balance support: No upper extremity supported Standing balance-Leahy Scale: Fair                      Cognition Arousal/Alertness: Awake/alert Behavior During Therapy: WFL for tasks assessed/performed Overall Cognitive Status: Within  Functional Limits for tasks assessed                      Exercises      General Comments        Pertinent Vitals/Pain Pain Assessment: 0-10 Pain Score: 3  Pain Location: BLEs Pain Descriptors / Indicators: Sore Pain Intervention(s): Monitored during session    Home Living                      Prior Function            PT Goals (current goals can now be found in the care plan section) Acute Rehab PT Goals Patient Stated Goal: return home PT Goal Formulation: With patient Time For Goal Achievement: 06/12/16 Potential to Achieve Goals: Good Progress towards PT goals: Progressing toward goals    Frequency    Min 3X/week      PT Plan Current plan remains appropriate    Co-evaluation             End of Session Equipment Utilized During Treatment: Gait belt;Oxygen Activity Tolerance: Patient tolerated treatment well Patient left:  (sitting at sink with set up for bathing, nsg present)     Time: [id number] PT Time Calculation (min) (ACUTE ONLY): 19 min  Charges:  $Gait Training: 8-22 mins                    G Codes:      Duncan Dull 06-26-16, 4:52 PM Alben Deeds,  PT DPT [phone]

## 2016-06-01 NOTE — Progress Notes (Signed)
ANTICOAGULATION CONSULT NOTE - Follow Up Consult  Pharmacy Consult for Heparin Indication: mechanical AVR  No Known Allergies  Patient Measurements: Height: 5\' 2"  (157.5 cm) Weight: 171 lb 3.2 oz (77.7 kg) (scale b) IBW/kg (Calculated) : 50.1 Heparin Dosing Weight: 67kg  Vital Signs: Temp: 97.8 F (36.6 C) (11/20 1259) Temp Source: Oral (11/20 1259) BP: 98/46 (11/20 1259) Pulse Rate: 84 (11/20 1259)  Labs:  Recent Labs  05/30/16 0340 05/30/16 1635 05/31/16 0337  05/31/16 2130 06/01/16 0444 06/01/16 0951 06/01/16 1202 06/01/16 1907  HGB 11.1* 11.7* 11.1*  --   --  11.5*  --   --   --   HCT 34.0* 36.5 34.0*  --   --  35.4*  --   --   --   PLT 348 394 379  --   --  386  --   --   --   LABPROT 13.5  --  13.0  --   --  13.9  --   --   --   INR 1.03  --  0.98  --   --  1.07  --   --   --   HEPARINUNFRC  --   --  0.10*  < > 0.19*  --  0.26*  --  0.62  CREATININE 0.99 1.09* 1.00  --   --   --   --  1.00  --   < > = values in this interval not displayed.  Estimated Creatinine Clearance: 58.4 mL/min (by C-G formula based on SCr of 1 mg/dL).   Medications:  Heparin @ 1600 units/hr  Assessment: 60yof continues on a heparin bridge while INR subtherapeutic for her mechanical AVR. Heparin level low this morning and rate increased. Follow up level is therapeutic at 0.62. No bleeding reported.  Goal of Therapy:  Heparin level 0.3-0.7 units/ml Monitor platelets by anticoagulation protocol: Yes   Plan:  1) Decrease rate slightly to 1550 units/hr  2) Daily heparin level, CBC  Deboraha Sprang 06/01/2016,7:52 PM

## 2016-06-01 NOTE — Progress Notes (Addendum)
PROGRESS NOTE    Elizabeth Morton  [id number]  DOB: 04-Nov-1955  DOA: 05/24/2016 PCP: [name], MD Outpatient Specialists:   Hospital course: 60 yo woman with rectus sheath hematoma during enoxaparin-warfarin "bridging" for mechanical AVR following ventral hernia repair, complicated by moderate acute blood loss anemia, mild acute renal insufficiency and acute exacerbation of chronic diastolic heart failure following volume resuscitation. Background problems include CAD s/p CABG and moderately elevated AV prosthesis gradients.  CT 05/28/16 showed enlarging hematoma and anticoagulation was reversed with FFP and vit K.   Assessment & Plan:    Rectus sheath hematoma - hg holding stable, started on IV heparin and oral warfarin INR subtherapeutic, pharmacy managing dosing - see orders.   CHF - appears well compensated at this time, monitoring weights, I/Os.    ARF - resolved now.    Hypokalemia - treated with oral replacement, recheck daily BMPs.   CAD s/p CABG - appreciated cardiology team assistance, no angina.    Mechanical AVR - resuming anticoagulation as above.     Acute hypoxic respiratory failure - resolved now.   Hypothyroidism - resume levothyroxine.   DVT prophylaxis: heparin/warfarin Code Status: full Family Communication: patient and daughter Disposition Plan: Home when INR>2 per cardiology  Consultants:  Ccm  Cardiology  surgery  Subjective: Pt reports no bleeding complications, seems to be tolerating heparin infusion so far with no bleeding.   Objective: Vitals:   05/31/16 1300 05/31/16 2044 06/01/16 0356 06/01/16 0945  BP: (!) 96/51 (!) 99/58 (!) 104/52 (!) 98/50  Pulse: 76 77 81   Resp: 18 18 18    Temp: 98.6 F (37 C)  97.8 F (36.6 C)   TempSrc: Oral  Oral   SpO2: 93% 95% 96%   Weight:   77.7 kg (171 lb 3.2 oz)   Height:        Intake/Output Summary (Last 24 hours) at 06/01/16 1039 Last data filed at 06/01/16 0838  Gross per 24  hour  Intake          3233.86 ml  Output             1126 ml  Net          2107.86 ml   Filed Weights   05/30/16 0500 05/31/16 0450 06/01/16 0356  Weight: 77 kg (169 lb 12.8 oz) 76.6 kg (168 lb 14.4 oz) 77.7 kg (171 lb 3.2 oz)    Exam:  General exam: sitting up, alert, no distress cooperative Respiratory system: Clear. No increased work of breathing. Cardiovascular system: S1 & S2 heard, with valve click. No JVD, murmurs, gallops, clicks or pedal edema. Gastrointestinal system: Abdomen is nondistended, soft and nontender. Normal bowel sounds heard. Central nervous system: Alert and oriented. No focal neurological deficits. Extremities: no CCE.  Data Reviewed: Basic Metabolic Panel:  Recent Labs Lab 05/27/16 0420  05/28/16 0500  05/28/16 1827 05/29/16 0412 05/30/16 0340 05/30/16 1635 05/31/16 0337  NA 139  < > 142  < > 136 135 136 133* 133*  K 3.1*  < > 2.3*  < > 3.0* 3.6 4.2 3.6 3.3*  CL 103  < > 114*  < > 95* 97* 100* 98* 99*  CO2 25  < > 23  < > 31 29 27 27 25   GLUCOSE 149*  < > 102*  < > 105* 93 98 101* 116*  BUN 18  < > 14  < > 19 19 19 15 15   CREATININE 1.13*  < > 0.73  < >  0.92 0.91 0.99 1.09* 1.00  CALCIUM 8.0*  < > 5.4*  < > 8.6* 8.2* 8.4* 8.6* 8.5*  MG 1.9  --  1.1*  --  3.8*  --   --   --   --   PHOS 3.1  --   --   --   --   --   --   --   --   < > = values in this interval not displayed. Liver Function Tests: No results for input(s): AST, ALT, ALKPHOS, BILITOT, PROT, ALBUMIN in the last 168 hours. No results for input(s): LIPASE, AMYLASE in the last 168 hours. No results for input(s): AMMONIA in the last 168 hours. CBC:  Recent Labs Lab 05/29/16 0412 05/30/16 0340 05/30/16 1635 05/31/16 0337 06/01/16 0444  WBC 10.3 9.7 8.4 8.4 7.6  HGB 10.9* 11.1* 11.7* 11.1* 11.5*  HCT 32.9* 34.0* 36.5 34.0* 35.4*  MCV 86.8 89.0 89.0 88.8 89.4  PLT 332 348 394 379 386   Cardiac Enzymes:  Recent Labs Lab 05/25/16 1429 05/25/16 1841  CKTOTAL 54  --   CKMB  15.0*  --   TROPONINI 0.04* 0.16*   CBG (last 3)  No results for input(s): GLUCAP in the last 72 hours. Recent Results (from the past 240 hour(s))  MRSA PCR Screening     Status: None   Collection Time: 05/25/16  5:56 AM  Result Value Ref Range Status   MRSA by PCR NEGATIVE NEGATIVE Final    Comment:        The GeneXpert MRSA Assay (FDA approved for NASAL specimens only), is one component of a comprehensive MRSA colonization surveillance program. It is not intended to diagnose MRSA infection nor to guide or monitor treatment for MRSA infections.   Culture, blood (routine x 2)     Status: None   Collection Time: 05/26/16 11:19 AM  Result Value Ref Range Status   Specimen Description BLOOD LEFT ARM  Final   Special Requests BOTTLES DRAWN AEROBIC ONLY 5CC  Final   Culture NO GROWTH 5 DAYS  Final   Report Status 05/31/2016 FINAL  Final  Culture, blood (routine x 2)     Status: None   Collection Time: 05/26/16 11:25 AM  Result Value Ref Range Status   Specimen Description BLOOD LEFT ARM  Final   Special Requests BOTTLES DRAWN AEROBIC ONLY 5CC  Final   Culture NO GROWTH 5 DAYS  Final   Report Status 05/31/2016 FINAL  Final  Culture, Urine     Status: None   Collection Time: 05/26/16  2:09 PM  Result Value Ref Range Status   Specimen Description URINE, CATHETERIZED  Final   Special Requests Normal  Final   Culture NO GROWTH  Final   Report Status 05/27/2016 FINAL  Final     Studies: No results found.  Scheduled Meds: . docusate sodium  100 mg Oral BID  . furosemide  60 mg Oral Daily  . levothyroxine  50 mcg Oral QAC breakfast  . pantoprazole  40 mg Oral Daily  . polyethylene glycol  17 g Oral Daily  . potassium chloride  20 mEq Oral BID  . Warfarin - Pharmacist Dosing Inpatient   Does not apply q1800   Continuous Infusions: . heparin 1,450 Units/hr (05/31/16 2349)   Principal Problem:   Acute on chronic diastolic congestive heart failure (HCC) Active Problems:    Warfarin anticoagulation   S/P aortic valve replacement   Acute blood loss anemia   S/P  repair of ventral hernia Nov 2017   Rectus sheath hematoma, initial encounter   Hypotension   Acute respiratory failure (HCC)   Abdominal distension   Bleeding  Time spent:   Irwin Brakeman, MD, FAAFP [facility] Pager [phone]  If 7PM-7AM, please contact night-coverage [email or web address] Password [email or web address] 06/01/2016, 10:39 AM    LOS: 8 days

## 2016-06-02 LAB — BASIC METABOLIC PANEL
ANION GAP: 9 (ref 5–15)
BUN: 16 mg/dL (ref 6–20)
CALCIUM: 9 mg/dL (ref 8.9–10.3)
CO2: 22 mmol/L (ref 22–32)
CREATININE: 1.05 mg/dL — AB (ref 0.44–1.00)
Chloride: 103 mmol/L (ref 101–111)
GFR calc Af Amer: >60 mL/min (ref >60)
GFR, EST NON AFRICAN AMERICAN: 57 mL/min — AB (ref >60)
GLUCOSE: 91 mg/dL (ref 65–99)
Potassium: 3.9 mmol/L (ref 3.5–5.1)
Sodium: 134 mmol/L — ABNORMAL LOW (ref 135–145)

## 2016-06-02 LAB — CBC
HCT: 34.5 % — ABNORMAL LOW (ref 36.0–46.0)
Hemoglobin: 11.2 g/dL — ABNORMAL LOW (ref 12.0–15.0)
MCH: 29.1 pg (ref 26.0–34.0)
MCHC: 32.5 g/dL (ref 30.0–36.0)
MCV: 89.6 fL (ref 78.0–100.0)
PLATELETS: 434 10*3/uL — AB (ref 150–400)
RBC: 3.85 MIL/uL — AB (ref 3.87–5.11)
RDW: 15.8 % — AB (ref 11.5–15.5)
WBC: 8 10*3/uL (ref 4.0–10.5)

## 2016-06-02 LAB — PROTIME-INR
INR: 1.07
Prothrombin Time: 13.9 seconds (ref 11.4–15.2)

## 2016-06-02 LAB — HEPARIN LEVEL (UNFRACTIONATED): HEPARIN UNFRACTIONATED: 0.67 [IU]/mL (ref 0.30–0.70)

## 2016-06-02 MED ORDER — WARFARIN SODIUM 5 MG PO TABS
5.0000 mg | ORAL_TABLET | Freq: Once | ORAL | Status: AC
  Administered 2016-06-02: 5 mg via ORAL
  Filled 2016-06-02: qty 1

## 2016-06-02 MED ORDER — WARFARIN SODIUM 2 MG PO TABS: 4.0000 mg | ORAL_TABLET | Freq: Once | ORAL | Status: DC

## 2016-06-02 MED ORDER — FUROSEMIDE 40 MG PO TABS
40.0000 mg | ORAL_TABLET | Freq: Two times a day (BID) | ORAL | Status: DC
  Administered 2016-06-02 – 2016-06-05 (×6): 40 mg via ORAL
  Filled 2016-06-02 (×6): qty 1

## 2016-06-02 NOTE — Discharge Instructions (Signed)

## 2016-06-02 NOTE — Progress Notes (Signed)
Patient Name: Elizabeth Morton Date of Encounter: 06/02/2016  Primary Cardiologist: [facility] Problem List     Principal Problem:   Acute on chronic diastolic congestive heart failure [facility]) Active Problems:   Warfarin anticoagulation   S/P aortic valve replacement   Acute blood loss anemia   S/P repair of ventral hernia Nov 2017   Rectus sheath hematoma, initial encounter   Hypotension   Acute respiratory failure (HCC)   Abdominal distension   Bleeding    Subjective   Feeling well this morning, complains of mild LE edema, no SOB, does report some generalized weakness. Also on warfarin overlap, otherwise asymptomatic. INR 1.07 today - need to overcome Vit K with warfarin dosing.  Inpatient Medications    Scheduled Meds: . docusate sodium  100 mg Oral BID  . furosemide  60 mg Oral Daily  . levothyroxine  50 mcg Oral QAC breakfast  . pantoprazole  40 mg Oral Daily  . polyethylene glycol  17 g Oral Daily  . potassium chloride  20 mEq Oral BID  . warfarin  5 mg Oral ONCE-1800  . Warfarin - Pharmacist Dosing Inpatient   Does not apply q1800   Continuous Infusions: . heparin 1,550 Units/hr (06/01/16 2151)   PRN Meds: acetaminophen, HYDROmorphone (DILAUDID) injection, ipratropium-albuterol, ondansetron **OR** ondansetron (ZOFRAN) IV, sodium chloride flush   Vital Signs    Vitals:   06/01/16 0945 06/01/16 1259 06/01/16 2120 06/02/16 0552  BP: (!) 98/50 (!) 98/46 (!) 103/51 (!) 99/45  Pulse:  84 85 78  Resp:  18 18 18   Temp:  97.8 F (36.6 C) 97.9 F (36.6 C) 97.8 F (36.6 C)  TempSrc:  Oral Oral Oral  SpO2:  100% 97% 97%  Weight:    168 lb 14.4 oz (76.6 kg)  Height:        Intake/Output Summary (Last 24 hours) at 06/02/16 1010 Last data filed at 06/02/16 0710  Gross per 24 hour  Intake           309.58 ml  Output             1401 ml  Net         -1091.42 ml   Filed Weights   05/31/16 0450 06/01/16 0356 06/02/16 0552  Weight: 168 lb 14.4 oz (76.6  kg) 171 lb 3.2 oz (77.7 kg) 168 lb 14.4 oz (76.6 kg)    Physical Exam   Comfortable, smiling GEN: Well nourished, well developed, in no acute distress.  HEENT: Grossly normal.  Neck: Supple, no JVD, carotid bruits, or masses. Cardiac: RRR, crisp prosthetic valve clicks, 1/6 Ao ejection murmur, no diastolic murmurs, rubs, or gallops. No clubbing, cyanosis, mild LE edema.  Radials/DP/PT 2+ and equal bilaterally.  Respiratory:  Respirations regular and unlabored, clear to auscultation bilaterally. GI: Soft, tender in L flank, nondistended, BS + x 4. MS: no deformity or atrophy. Skin: warm and dry, no rash. Neuro:  Strength and sensation are intact. Psych: AAOx3.  Normal affect.  Labs    CBC  Recent Labs  06/01/16 0444 06/02/16 0546  WBC 7.6 8.0  HGB 11.5* 11.2*  HCT 35.4* 34.5*  MCV 89.4 89.6  PLT 386 [id number]*   Basic Metabolic Panel  Recent Labs  06/01/16 1202 06/02/16 0546  NA 135 134*  K 4.4 3.9  CL 100* 103  CO2 25 22  GLUCOSE 74 91  BUN 14 16  CREATININE 1.00 1.05*  CALCIUM 9.2 9.0     Telemetry  NSR - Personally Reviewed  ECHO    TTE: 05/29/16  Study Conclusions  - Procedure narrative: Transthoracic echocardiography. Image   quality was adequate. The study was technically difficult. - Left ventricle: The cavity size was normal. Wall thickness was   increased in a pattern of mild LVH. Systolic function was normal.   The estimated ejection fraction was in the range of 55% to 60%.   Features are consistent with a pseudonormal left ventricular   filling pattern, with concomitant abnormal relaxation and   increased filling pressure (grade 2 diastolic dysfunction). - Aortic valve: A bioprosthesis was present. There was mild   regurgitation. - Mitral valve: There was mild regurgitation. - Pulmonary arteries: Systolic pressure was mildly increased. PA   peak pressure: 38 mm Hg (S).  Radiology    No results found.   Patient Profile     60 yo woman  with rectus sheath hematoma during enoxaparin-warfarin "bridging" for mechanical AVR following ventral hernia repair, complicated by moderate acute blood loss anemia, mild acute renal insufficiency and acute exacerbation of chronic diastolic heart failure following volume resuscitation. Background problems include CAD s/p CABG and moderately elevated AV prosthesis gradients. CT 05/28/16 showed enlarging hematoma and anticoagulation was reversed with FFP and vit K.   Assessment & Plan    1. CHF: she appears clinically mildly fluid overloaded with LE edema, weight is now down 171>>168lbs. Daily weights and strict in/out.  On po diuretics, 1000 cc yesterday. I will add lasix 60 mg po tonight as well. Crea, K stable and normal.  2. Mechanical AVR: INR 1.07. With her type of valve, risk of thrombosis is not extremely high. On IV heparin without bolus and warfarin until INR>2.0.  3. Rectus sheath hematoma: Hgb 11 and stable after last transfusion.  4. ARF: resolved.   5.CAD s/p CABG: no angina. Had chronic widespread ST changes, no different from before. Minimal increase in troponin is not unexpected and can be attributed to demand ischemia, not true acute coronary syndrome.  6. Hypokalemia: Resolved  Able to be discharged from a cardiology standpoint when INR >2. Today still 1.0.  Reino Bellis, NP-C 06/02/2016, 10:10 AM

## 2016-06-02 NOTE — Progress Notes (Addendum)
ANTICOAGULATION CONSULT NOTE - Follow Up Consult  Pharmacy Consult for Heparin/Warfarin Indication: mechanical AVR  No Known Allergies  Patient Measurements: Height: 5\' 2"  (157.5 cm) Weight: 168 lb 14.4 oz (76.6 kg) (Scale B) IBW/kg (Calculated) : 50.1 Heparin Dosing Weight: 67kg  Vital Signs: Temp: 97.8 F (36.6 C) (11/21 0552) Temp Source: Oral (11/21 0552) BP: 99/45 (11/21 0552) Pulse Rate: 78 (11/21 0552)  Labs:  Recent Labs  05/31/16 0337  06/01/16 0444 06/01/16 0951 06/01/16 1202 06/01/16 1907 06/02/16 0546  HGB 11.1*  --  11.5*  --   --   --  11.2*  HCT 34.0*  --  35.4*  --   --   --  34.5*  PLT 379  --  386  --   --   --  434*  LABPROT 13.0  --  13.9  --   --   --  13.9  INR 0.98  --  1.07  --   --   --  1.07  HEPARINUNFRC 0.10*  < >  --  0.26*  --  0.62 0.67  CREATININE 1.00  --   --   --  1.00  --  1.05*  < > = values in this interval not displayed.  Estimated Creatinine Clearance: 55.3 mL/min (by C-G formula based on SCr of 1.05 mg/dL (H)).   Medications:  Heparin @ 1550 units/hr  Assessment: 60yof continues on a heparin bridge while INR subtherapeutic for her mechanical AVR. Heparin level therapeutic on current rate. CBC is stable. No bleeding noted.  INR remains subtherapeutic with no movement after 5mg  x1 yesterday. Home regimen is 4.5mg  every Friday and 3mg  all other days. INR goal 2 to 2.5 as able due to recent rectus sheath hematoma.  Goal of Therapy:  INR 2-3 Heparin level 0.3-0.7 units/ml Monitor platelets by anticoagulation protocol: Yes   Plan:  1) Continue heparin rate at 1550 units/hr  2) Daily heparin level, CBC 3) Warfarin 5mg  x1 today - overcoming Vitamin K resistance 4) Daily INR 5) Monitor for signs and symptoms of bleeding.   Sloan Leiter, PharmD, BCPS Clinical Pharmacist [phone] until 3:30 PM [phone] after hours 06/02/2016,7:57 AM

## 2016-06-02 NOTE — Progress Notes (Signed)
PROGRESS NOTE    Elizabeth Morton  [id number]  DOB: 1956-02-16  DOA: 05/24/2016 PCP: [name], MD Outpatient Specialists:   Hospital course: 60 yo woman with rectus sheath hematoma during enoxaparin-warfarin "bridging" for mechanical AVR following ventral hernia repair, complicated by moderate acute blood loss anemia, mild acute renal insufficiency and acute exacerbation of chronic diastolic heart failure following volume resuscitation. Background problems include CAD s/p CABG and moderately elevated AV prosthesis gradients.  CT 05/28/16 showed enlarging hematoma and anticoagulation was reversed with FFP and vit K.   Assessment & Plan:    Rectus sheath hematoma - hg holding stable, started on IV heparin and oral warfarin INR subtherapeutic, pharmacy managing dosing - see orders.   CHF - appears well compensated at this time, monitoring weights, I/Os.    ARF - resolved now.    Hypokalemia - treated with oral replacement, recheck daily BMPs.   CAD s/p CABG - appreciated cardiology team assistance, no angina.    Mechanical AVR - resuming anticoagulation as above.  Cardiology says that can be safely discharged when INR>2.     Acute hypoxic respiratory failure - resolved now.   Hypothyroidism - resume levothyroxine.   Warfarin anticoagulation - has been somewhat resistant as patient received vit K earlier, INR still 1.   DVT prophylaxis: heparin/warfarin Code Status: full Family Communication: patient and daughter Disposition Plan: Home when INR>2 per cardiology  Consultants:  Ccm  Cardiology  surgery  Subjective: Pt reports no bleeding complications, seems to be tolerating heparin infusion so far with no bleeding.   Objective: Vitals:   06/01/16 1259 06/01/16 2120 06/02/16 0552 06/02/16 1243  BP: (!) 98/46 (!) 103/51 (!) 99/45 (!) 100/52  Pulse: 84 85 78 80  Resp: 18 18 18 18   Temp: 97.8 F (36.6 C) 97.9 F (36.6 C) 97.8 F (36.6 C) 97.4 F (36.3  C)  TempSrc: Oral Oral Oral Oral  SpO2: 100% 97% 97% 97%  Weight:   76.6 kg (168 lb 14.4 oz)   Height:        Intake/Output Summary (Last 24 hours) at 06/02/16 1517 Last data filed at 06/02/16 1335  Gross per 24 hour  Intake           549.58 ml  Output             1325 ml  Net          -775.42 ml   Filed Weights   05/31/16 0450 06/01/16 0356 06/02/16 0552  Weight: 76.6 kg (168 lb 14.4 oz) 77.7 kg (171 lb 3.2 oz) 76.6 kg (168 lb 14.4 oz)    Exam:  General exam: sitting up, alert, no distress cooperative Respiratory system: Clear. No increased work of breathing. Cardiovascular system: S1 & S2 heard, with valve click. No JVD, murmurs, gallops, clicks or pedal edema. Gastrointestinal system: Abdomen is nondistended, soft and nontender. Normal bowel sounds heard. Central nervous system: Alert and oriented. No focal neurological deficits. Extremities: no CCE.  Data Reviewed: Basic Metabolic Panel:  Recent Labs Lab 05/27/16 0420  05/28/16 0500  05/28/16 1827  05/30/16 0340 05/30/16 1635 05/31/16 0337 06/01/16 1202 06/02/16 0546  NA 139  < > 142  < > 136  < > 136 133* 133* 135 134*  K 3.1*  < > 2.3*  < > 3.0*  < > 4.2 3.6 3.3* 4.4 3.9  CL 103  < > 114*  < > 95*  < > 100* 98* 99* 100* 103  CO2  25  < > 23  < > 31  < > 27 27 25 25 22   GLUCOSE 149*  < > 102*  < > 105*  < > 98 101* 116* 74 91  BUN 18  < > 14  < > 19  < > 19 15 15 14 16   CREATININE 1.13*  < > 0.73  < > 0.92  < > 0.99 1.09* 1.00 1.00 1.05*  CALCIUM 8.0*  < > 5.4*  < > 8.6*  < > 8.4* 8.6* 8.5* 9.2 9.0  MG 1.9  --  1.1*  --  3.8*  --   --   --   --   --   --   PHOS 3.1  --   --   --   --   --   --   --   --   --   --   < > = values in this interval not displayed. Liver Function Tests: No results for input(s): AST, ALT, ALKPHOS, BILITOT, PROT, ALBUMIN in the last 168 hours. No results for input(s): LIPASE, AMYLASE in the last 168 hours. No results for input(s): AMMONIA in the last 168 hours. CBC:  Recent  Labs Lab 05/30/16 0340 05/30/16 1635 05/31/16 0337 06/01/16 0444 06/02/16 0546  WBC 9.7 8.4 8.4 7.6 8.0  HGB 11.1* 11.7* 11.1* 11.5* 11.2*  HCT 34.0* 36.5 34.0* 35.4* 34.5*  MCV 89.0 89.0 88.8 89.4 89.6  PLT 348 394 379 386 434*   Cardiac Enzymes: No results for input(s): CKTOTAL, CKMB, CKMBINDEX, TROPONINI in the last 168 hours. CBG (last 3)  No results for input(s): GLUCAP in the last 72 hours. Recent Results (from the past 240 hour(s))  MRSA PCR Screening     Status: None   Collection Time: 05/25/16  5:56 AM  Result Value Ref Range Status   MRSA by PCR NEGATIVE NEGATIVE Final    Comment:        The GeneXpert MRSA Assay (FDA approved for NASAL specimens only), is one component of a comprehensive MRSA colonization surveillance program. It is not intended to diagnose MRSA infection nor to guide or monitor treatment for MRSA infections.   Culture, blood (routine x 2)     Status: None   Collection Time: 05/26/16 11:19 AM  Result Value Ref Range Status   Specimen Description BLOOD LEFT ARM  Final   Special Requests BOTTLES DRAWN AEROBIC ONLY 5CC  Final   Culture NO GROWTH 5 DAYS  Final   Report Status 05/31/2016 FINAL  Final  Culture, blood (routine x 2)     Status: None   Collection Time: 05/26/16 11:25 AM  Result Value Ref Range Status   Specimen Description BLOOD LEFT ARM  Final   Special Requests BOTTLES DRAWN AEROBIC ONLY 5CC  Final   Culture NO GROWTH 5 DAYS  Final   Report Status 05/31/2016 FINAL  Final  Culture, Urine     Status: None   Collection Time: 05/26/16  2:09 PM  Result Value Ref Range Status   Specimen Description URINE, CATHETERIZED  Final   Special Requests Normal  Final   Culture NO GROWTH  Final   Report Status 05/27/2016 FINAL  Final     Studies: No results found.  Scheduled Meds: . docusate sodium  100 mg Oral BID  . furosemide  40 mg Oral BID  . levothyroxine  50 mcg Oral QAC breakfast  . pantoprazole  40 mg Oral Daily  .  polyethylene glycol  17 g Oral Daily  . potassium chloride  20 mEq Oral BID  . warfarin  5 mg Oral ONCE-1800  . Warfarin - Pharmacist Dosing Inpatient   Does not apply q1800   Continuous Infusions: . heparin 1,550 Units/hr (06/01/16 2151)   Principal Problem:   Acute on chronic diastolic congestive heart failure (HCC) Active Problems:   Warfarin anticoagulation   S/P aortic valve replacement   Acute blood loss anemia   S/P repair of ventral hernia Nov 2017   Rectus sheath hematoma, initial encounter   Hypotension   Acute respiratory failure (HCC)   Abdominal distension   Bleeding  Time spent:   Irwin Brakeman, MD, FAAFP [facility] Pager [phone]  If 7PM-7AM, please contact night-coverage [email or web address] Password [email or web address] 06/02/2016, 3:17 PM    LOS: 9 days

## 2016-06-02 NOTE — Progress Notes (Signed)
Pt iv continues to beep , iv team has come to assess several times there is no sign of occlusion within the line. The pt admits to occulding the line by bending her arm to get staff attention . Pt has heparin infusion .  Pt teaching provided about the importance of continuous heaprin infusion and use of call bell to notify staff for assistance . Pt was very upset. Pt does not attempt self care and wants staff to  Attend to basic adl's . Pt teaching provided about this as well- regarding the importance of self care for home health maintenance .

## 2016-06-03 DIAGNOSIS — J811 Chronic pulmonary edema: Secondary | ICD-10-CM

## 2016-06-03 DIAGNOSIS — I509 Heart failure, unspecified: Secondary | ICD-10-CM

## 2016-06-03 DIAGNOSIS — S301XXD Contusion of abdominal wall, subsequent encounter: Secondary | ICD-10-CM

## 2016-06-03 DIAGNOSIS — J9600 Acute respiratory failure, unspecified whether with hypoxia or hypercapnia: Secondary | ICD-10-CM

## 2016-06-03 LAB — BASIC METABOLIC PANEL
Anion gap: 12 (ref 5–15)
BUN: 13 mg/dL (ref 6–20)
CALCIUM: 9.3 mg/dL (ref 8.9–10.3)
CHLORIDE: 102 mmol/L (ref 101–111)
CO2: 22 mmol/L (ref 22–32)
CREATININE: 1.13 mg/dL — AB (ref 0.44–1.00)
GFR calc non Af Amer: 52 mL/min — ABNORMAL LOW (ref >60)
GLUCOSE: 90 mg/dL (ref 65–99)
Potassium: 4 mmol/L (ref 3.5–5.1)
Sodium: 136 mmol/L (ref 135–145)

## 2016-06-03 LAB — PROTIME-INR
INR: 1.12
Prothrombin Time: 14.4 seconds (ref 11.4–15.2)

## 2016-06-03 LAB — CBC
HEMATOCRIT: 36.6 % (ref 36.0–46.0)
Hemoglobin: 11.8 g/dL — ABNORMAL LOW (ref 12.0–15.0)
MCH: 28.9 pg (ref 26.0–34.0)
MCHC: 32.2 g/dL (ref 30.0–36.0)
MCV: 89.5 fL (ref 78.0–100.0)
PLATELETS: 461 10*3/uL — AB (ref 150–400)
RBC: 4.09 MIL/uL (ref 3.87–5.11)
RDW: 15.8 % — AB (ref 11.5–15.5)
WBC: 7.8 10*3/uL (ref 4.0–10.5)

## 2016-06-03 LAB — HEPARIN LEVEL (UNFRACTIONATED)
HEPARIN UNFRACTIONATED: 0.51 [IU]/mL (ref 0.30–0.70)
Heparin Unfractionated: 0.17 IU/mL — ABNORMAL LOW (ref 0.30–0.70)

## 2016-06-03 MED ORDER — SODIUM CHLORIDE 0.9% FLUSH
10.0000 mL | INTRAVENOUS | Status: DC | PRN
  Administered 2016-06-06: 10 mL
  Filled 2016-06-03: qty 40

## 2016-06-03 MED ORDER — WARFARIN SODIUM 10 MG PO TABS
10.0000 mg | ORAL_TABLET | Freq: Once | ORAL | Status: AC
  Administered 2016-06-03: 10 mg via ORAL
  Filled 2016-06-03: qty 1

## 2016-06-03 NOTE — Progress Notes (Signed)
Pt refused to have bed alarm turned on, but stated she will call if she has any needs, will continue to monitor.

## 2016-06-03 NOTE — Progress Notes (Signed)
Physical Therapy Treatment Patient Details Name: Elizabeth Morton MRN: [id number] DOB: Mar 16, 1956 Today's Date: 06-09-16    History of Present Illness 60 yo admitted with left rectus sheath hematoma 11/12 after ventral hernia repair 11/3. Pt with demand ischemia and SOB since admission. PMHx: CABG, CHF, mech AVR, HTN    PT Comments    Pt doing well with mobility and no further PT needed.  Ready for dc from PT standpoint.    Follow Up Recommendations  No PT follow up     Equipment Recommendations  None recommended by PT    Recommendations for Other Services       Precautions / Restrictions Precautions Precautions: Fall Restrictions Weight Bearing Restrictions: No    Mobility  Bed Mobility Overal bed mobility: Modified Independent             General bed mobility comments: increased time to perform, no physical assist required  Transfers Overall transfer level: Modified independent Equipment used: None             General transfer comment: Incr time  Ambulation/Gait Ambulation/Gait assistance: Modified independent (Device/Increase time) Ambulation Distance (Feet): 300 Feet Assistive device: None Gait Pattern/deviations: Step-through pattern;Decreased stride length Gait velocity: decreased Gait velocity interpretation: Below normal speed for age/gender General Gait Details: No loss of balance   Stairs Stairs: Yes Stairs assistance: Min assist Stair Management: One rail Left;Step to pattern;Forwards Number of Stairs: 2 General stair comments: Rail on 1 side and hand held on the other side. Only attempted 2 stairs due to IV pole  Wheelchair Mobility    Modified Rankin (Stroke Patients Only)       Balance Overall balance assessment: Needs assistance Sitting-balance support: No upper extremity supported Sitting balance-Leahy Scale: Normal     Standing balance support: No upper extremity supported Standing balance-Leahy Scale: Good                       Cognition Arousal/Alertness: Awake/alert Behavior During Therapy: WFL for tasks assessed/performed Overall Cognitive Status: Within Functional Limits for tasks assessed                      Exercises      General Comments        Pertinent Vitals/Pain Pain Assessment: No/denies pain    Home Living                      Prior Function            PT Goals (current goals can now be found in the care plan section) Progress towards PT goals: Goals met/education completed, patient discharged from PT    Frequency           PT Plan Current plan remains appropriate    Co-evaluation             End of Session   Activity Tolerance: Patient tolerated treatment well Patient left: in bed;with call bell/phone within reach     Time: 1156-1208 PT Time Calculation (min) (ACUTE ONLY): 12 min  Charges:  $Gait Training: 8-22 mins                    G CodesShary Decamp [facility] 2016-06-09, 3:55 PM [employer] PT [phone]

## 2016-06-03 NOTE — Progress Notes (Signed)
[place] for heparin / warfarin  Indication: Mechanical AVR  No Known Allergies  Patient Measurements: Height: 5\' 2"  (157.5 cm) Weight: 165 lb 8 oz (75.1 kg) (scale b) IBW/kg (Calculated) : 50.1 Heparin Dosing Weight: 67 kg  Vital Signs: Temp: 97.7 F (36.5 C) (11/22 0631) Temp Source: Oral (11/22 0631) BP: 102/72 (11/22 0631) Pulse Rate: 83 (11/22 0631)  Labs:  Recent Labs  06/01/16 0444  06/01/16 1202 06/01/16 1907 06/02/16 0546 06/03/16 0439  HGB 11.5*  --   --   --  11.2* 11.8*  HCT 35.4*  --   --   --  34.5* 36.6  PLT 386  --   --   --  434* 461*  LABPROT 13.9  --   --   --  13.9 14.4  INR 1.07  --   --   --  1.07 1.12  HEPARINUNFRC  --   < >  --  0.62 0.67 0.17*  CREATININE  --   --  1.00  --  1.05* 1.13*  < > = values in this interval not displayed.  Estimated Creatinine Clearance: 50.9 mL/min (by C-G formula based on SCr of 1.13 mg/dL (H)).   Medical History: Past Medical History:  Diagnosis Date  . Aortic stenosis    Aortic valve replacement 2006  . CAD (coronary artery disease)    a. s/p CABG 2006 at time of AVR.  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 Dyslipidemia   . Gallstones   . GERD (gastroesophageal reflux disease)   . Heart murmur   . Hx of CABG    2006,LIMA to LAD, SVG to diagonal  . Hx of transfusion of packed red blood cells   . Hypertension   . Hypothyroidism   . Iron deficiency anemia   . Lower GI bleed 06/2015  . Pericardial effusion    Insignificant small pericardial effusion seen on echo, November, 2013  . Peripheral vascular disease ([place]) 06   blood clot -stroke  . S/P aortic valve replacement    a. Bentall procedure,#21 St. Jude mechanical valve conduit with reimplantation of the coronaries 2006  . Status post colonoscopy with polypectomy    "bleeding; colonoscopy was ~ 06/18/2015"  . Stroke [facility]) 2006   no deficits   . Warfarin anticoagulation     coumadin therapy     Assessment: 60 yo female with mechanical AVR, she was bridged perioperatively with Lovenox as an outpatient but developed multiple abdominal wall/rectus sheath hematomas and had a 5 g drop in her hemoglobin. She has received multiple units of pRBC and FFP throughout her admission.All anticoagulation was resumed 11/18, now overcoming vitamin K resistance  INR today = 1, low heparin level addressed this AM  PTA warfarin: 4.5 mg qFri, 3 mg all other days  Goal of Therapy:  Heparin level 0.3-0.7 units/ml Monitor platelets by anticoagulation protocol: Yes INR 2-3  Plan:  Heparin at 1600 units / hr Coumadin 10 mg po x 1 dose today 1400 HL Daily labs  Thank you Anette Guarneri, PharmD [phone] 06/03/2016, 8:56 AM

## 2016-06-03 NOTE — Progress Notes (Signed)
[place] for heparin / warfarin  Indication: Mechanical AVR  No Known Allergies  Patient Measurements: Height: 5\' 2"  (157.5 cm) Weight: 165 lb 8 oz (75.1 kg) (scale b) IBW/kg (Calculated) : 50.1 Heparin Dosing Weight: 67 kg  Vital Signs: Temp: 97.3 F (36.3 C) (11/22 1232) Temp Source: Axillary (11/22 1232) BP: 100/44 (11/22 1232) Pulse Rate: 86 (11/22 1232)  Labs:  Recent Labs  06/01/16 0444  06/01/16 1202  06/02/16 0546 06/03/16 0439 06/03/16 1310  HGB 11.5*  --   --   --  11.2* 11.8*  --   HCT 35.4*  --   --   --  34.5* 36.6  --   PLT 386  --   --   --  434* 461*  --   LABPROT 13.9  --   --   --  13.9 14.4  --   INR 1.07  --   --   --  1.07 1.12  --   HEPARINUNFRC  --   < >  --   < > 0.67 0.17* 0.51  CREATININE  --   --  1.00  --  1.05* 1.13*  --   < > = values in this interval not displayed.  Estimated Creatinine Clearance: 50.9 mL/min (by C-G formula based on SCr of 1.13 mg/dL (H)).   Medical History: Past Medical History:  Diagnosis Date  . Aortic stenosis    Aortic valve replacement 2006  . CAD (coronary artery disease)    a. s/p CABG 2006 at time of AVR.  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 Dyslipidemia   . Gallstones   . GERD (gastroesophageal reflux disease)   . Heart murmur   . Hx of CABG    2006,LIMA to LAD, SVG to diagonal  . Hx of transfusion of packed red blood cells   . Hypertension   . Hypothyroidism   . Iron deficiency anemia   . Lower GI bleed 06/2015  . Pericardial effusion    Insignificant small pericardial effusion seen on echo, November, 2013  . Peripheral vascular disease ([place]) 06   blood clot -stroke  . S/P aortic valve replacement    a. Bentall procedure,#21 St. Jude mechanical valve conduit with reimplantation of the coronaries 2006  . Status post colonoscopy with polypectomy    "bleeding; colonoscopy was ~ 06/18/2015"  . Stroke [facility]) 2006   no  deficits   . Warfarin anticoagulation    coumadin therapy     Assessment: 60 yo female with mechanical AVR, she was bridged perioperatively with Lovenox as an outpatient but developed multiple abdominal wall/rectus sheath hematomas and had a 5 g drop in her hemoglobin. She has received multiple units of pRBC and FFP throughout her admission.All anticoagulation was resumed 11/18, now overcoming vitamin K resistance  INR today = 1, low heparin level addressed this AM, PM heparin level therapeutic  PTA warfarin: 4.5 mg qFri, 3 mg all other days  Goal of Therapy:  Heparin level 0.3-0.7 units/ml Monitor platelets by anticoagulation protocol: Yes INR 2-3  Plan:  Continue Heparin at 1600 units / hr Coumadin 10 mg po x 1 dose today Daily INR heparin level, CBC  Thank you Anette Guarneri, PharmD [phone] 06/03/2016, 2:52 PM

## 2016-06-03 NOTE — Progress Notes (Signed)
Patient Name: Elizabeth Morton Date of Encounter: 06/03/2016  Primary Cardiologist: [facility] Problem List     Principal Problem:   Acute on chronic diastolic congestive heart failure [facility]) Active Problems:   Warfarin anticoagulation   S/P aortic valve replacement   Acute blood loss anemia   S/P repair of ventral hernia Nov 2017   Rectus sheath hematoma, initial encounter   Hypotension   Acute respiratory failure (HCC)   Abdominal distension   Bleeding   Subjective   Feeling depressed from being here for solong,improved SOBand LE edema.  INR 1.1 today - need to overcome Vit K with warfarin dosing.  Inpatient Medications    Scheduled Meds: . docusate sodium  100 mg Oral BID  . furosemide  40 mg Oral BID  . levothyroxine  50 mcg Oral QAC breakfast  . pantoprazole  40 mg Oral Daily  . polyethylene glycol  17 g Oral Daily  . potassium chloride  20 mEq Oral BID  . Warfarin - Pharmacist Dosing Inpatient   Does not apply q1800   Continuous Infusions: . heparin 1,600 Units/hr (06/03/16 0635)   PRN Meds: acetaminophen, HYDROmorphone (DILAUDID) injection, ipratropium-albuterol, ondansetron **OR** ondansetron (ZOFRAN) IV, sodium chloride flush   Vital Signs    Vitals:   06/02/16 0552 06/02/16 1243 06/02/16 2148 06/03/16 0631  BP: (!) 99/45 (!) 100/52 116/76 102/72  Pulse: 78 80 88 83  Resp: 18 18 18 18   Temp: 97.8 F (36.6 C) 97.4 F (36.3 C) 98.2 F (36.8 C) 97.7 F (36.5 C)  TempSrc: Oral Oral Oral Oral  SpO2: 97% 97% 100% 98%  Weight: 168 lb 14.4 oz (76.6 kg)   165 lb 8 oz (75.1 kg)  Height:        Intake/Output Summary (Last 24 hours) at 06/03/16 0751 Last data filed at 06/03/16 [id number]  Gross per 24 hour  Intake              852 ml  Output             2400 ml  Net            -1548 ml   Filed Weights   06/01/16 0356 06/02/16 0552 06/03/16 0631  Weight: 171 lb 3.2 oz (77.7 kg) 168 lb 14.4 oz (76.6 kg) 165 lb 8 oz (75.1 kg)    Physical Exam   Comfortable, smiling GEN: Well nourished, well developed, in no acute distress.  HEENT: Grossly normal.  Neck: Supple, no JVD, carotid bruits, or masses. Cardiac: RRR, crisp prosthetic valve clicks, 1/6 Ao ejection murmur, no diastolic murmurs, rubs, or gallops. No clubbing, cyanosis, mild LE edema.  Radials/DP/PT 2+ and equal bilaterally.  Respiratory:  Respirations regular and unlabored, clear to auscultation bilaterally. GI: Soft, tender in L flank, nondistended, BS + x 4. MS: no deformity or atrophy. Skin: warm and dry, no rash. Neuro:  Strength and sensation are intact. Psych: AAOx3.  Normal affect.  Labs    CBC  Recent Labs  06/02/16 0546 06/03/16 0439  WBC 8.0 7.8  HGB 11.2* 11.8*  HCT 34.5* 36.6  MCV 89.6 89.5  PLT 434* [id number]*   Basic Metabolic Panel  Recent Labs  06/02/16 0546 06/03/16 0439  NA 134* 136  K 3.9 4.0  CL 103 102  CO2 22 22  GLUCOSE 91 90  BUN 16 13  CREATININE 1.05* 1.13*  CALCIUM 9.0 9.3     Telemetry    NSR - Personally Reviewed  ECHO  TTE: 05/29/16  Study Conclusions  - Procedure narrative: Transthoracic echocardiography. Image   quality was adequate. The study was technically difficult. - Left ventricle: The cavity size was normal. Wall thickness was   increased in a pattern of mild LVH. Systolic function was normal.   The estimated ejection fraction was in the range of 55% to 60%.   Features are consistent with a pseudonormal left ventricular   filling pattern, with concomitant abnormal relaxation and   increased filling pressure (grade 2 diastolic dysfunction). - Aortic valve: A bioprosthesis was present. There was mild   regurgitation. - Mitral valve: There was mild regurgitation. - Pulmonary arteries: Systolic pressure was mildly increased. PA   peak pressure: 38 mm Hg (S).  Radiology    No results found.   Patient Profile     60 yo woman with rectus sheath hematoma during enoxaparin-warfarin "bridging" for  mechanical AVR following ventral hernia repair, complicated by moderate acute blood loss anemia, mild acute renal insufficiency and acute exacerbation of chronic diastolic heart failure following volume resuscitation. Background problems include CAD s/p CABG and moderately elevated AV prosthesis gradients. CT 05/28/16 showed enlarging hematoma and anticoagulation was reversed with FFP and vit K.   Assessment & Plan    1. Acute on chronic diastolic CHF: she appears clinically mildly fluid overloaded with LE edema, weight is now down 171>>165lbs. Daily weights and strict in/out.  On po diuretics, 1.5 L yesterday. I will add lasix 60 mg po tonight again. Crea, K stable and normal.  2. Mechanical AVR: INR 1.07. With her type of valve, risk of thrombosis is not extremely high. On IV heparin without bolus and warfarin until INR>2.0. I have called the pharmacy as her INR is not moving, they will review coumadin dose.  3. Rectus sheath hematoma: Hgb 11 and stable after last transfusion.  4. ARF: resolved.   5.CAD s/p CABG: no angina. Had chronic widespread ST changes, no different from before. Minimal increase in troponin is not unexpected and can be attributed to demand ischemia, not true acute coronary syndrome.  6. Hypokalemia: Resolved  Able to be discharged from a cardiology standpoint when INR >2. Today still 1.1.  Ena Dawley, MD-C 06/03/2016, 7:51 AM

## 2016-06-03 NOTE — Progress Notes (Signed)
Physical Therapy Discharge Patient Details Name: Elizabeth Morton MRN: [id number] DOB: 10/18/1955 Today's Date: 06/03/2016 Time: 1156-1208 PT Time Calculation (min) (ACUTE ONLY): 12 min  Patient discharged from PT services secondary to goals met and no further PT needs identified.  Please see latest therapy progress note for current level of functioning and progress toward goals.    Progress and discharge plan discussed with patient and/or caregiver: Patient/Caregiver agrees with plan  GP     Cary W Maycok 06/03/2016, 3:56 PM  Cary Maycock PT [phone]  

## 2016-06-03 NOTE — Progress Notes (Signed)
ANTICOAGULATION CONSULT NOTE - Follow Up Consult  Pharmacy Consult for Heparin (while INR <2) Indication: Mechanical AVR  No Known Allergies  Patient Measurements: Height: 5\' 2"  (157.5 cm) Weight: 168 lb 14.4 oz (76.6 kg) (Scale B) IBW/kg (Calculated) : 50.1  Vital Signs: Temp: 98.2 F (36.8 C) (11/21 2148) Temp Source: Oral (11/21 2148) BP: 116/76 (11/21 2148) Pulse Rate: 88 (11/21 2148)  Labs:  Recent Labs  06/01/16 0444  06/01/16 1202 06/01/16 1907 06/02/16 0546 06/03/16 0439  HGB 11.5*  --   --   --  11.2* 11.8*  HCT 35.4*  --   --   --  34.5* 36.6  PLT 386  --   --   --  434* 461*  LABPROT 13.9  --   --   --  13.9 14.4  INR 1.07  --   --   --  1.07 1.12  HEPARINUNFRC  --   < >  --  0.62 0.67 0.17*  CREATININE  --   --  1.00  --  1.05*  --   < > = values in this interval not displayed.  Estimated Creatinine Clearance: 55.3 mL/min (by C-G formula based on SCr of 1.05 mg/dL (H)).    Assessment: 60 y/o F on heparin bridge while INR is low, heparin level is low this AM, possibly some infusion issues overnight, IV beeping frequently per RN, will be cautious with rate increase  Goal of Therapy:  Heparin level 0.3-0.7 units/ml Monitor platelets by anticoagulation protocol: Yes   Plan:  -Inc heparin back to 1600 units/hr (previously therapeutic rate) -1400 HL  Elizabeth Morton 06/03/2016,6:23 AM

## 2016-06-03 NOTE — Progress Notes (Signed)
PROGRESS NOTE  Elizabeth Morton [id number] DOB: 07-Sep-1955 DOA: 05/24/2016 PCP: [name], MD  Brief History:  60 yo woman with severe AS and CAD (s/p CABG x2 : LIMA to LAD; SVG to diagonal, and AVR (mechanical AVR as part of Bentall procedure in 2006), HTN, HLD, CVA (due to thrombosed aortic aneurysm), GERD, Fe defic anemia, lower GI bleed presented with rectus sheath hematoma during enoxaparin-warfarin "bridging" for mechanical AVR following ventral hernia repair, complicated by moderate acute blood loss anemia, mild acute renal insufficiency and acute exacerbation of chronic diastolic heart failure following volume resuscitation.   She underwent a lap-assisted ventral hernia repair by Dr. Hassell Done on 11/3. The surgery and her post-op course were uncomplicated. She was discharged on 11/5 on a lovenox bridge and coumadin, which she takes for her mechanical aortic valve. She has had evolving abdominal wall bruising since surgery, but on 05/24/16 when she looked in the mirror felt her abdomen to be more swollen, and has had worsening abdominal wall and back pain, particularly on the left abdominal wall. She has nausea and generally felt ill, so presented to the ER for evaluation. She was noted to have a 5-point drop in her hemoglobin since it was last checked 5 days agoCT 05/28/16 showed enlarging hematoma and anticoagulation was reversed with FFP and vit K.  Assessment/Plan: Acute on chronic diastolic CHF -Continue furosemide under direction of cardiology -Admission weight 173 pounds -06/03/2016 weight 165 pounds -question I/O accuracy   Mechanical AVR: - INR 1.12. With her type of valve, risk of thrombosis is not extremely high. On IV heparin without bolus and warfarin until INR>2.0  Rectus sheath hematoma:  -Hgb 11 and stable after last transfusion.  Acute Blood Loss Anemia -baseline Hgb ~11 -Hgb nadir 7.0 -transfused 6 units PRBC  AKI -Secondary to hemodynamic  changes -Resolved  CAD s/p CABG:  -no angina. Had chronic widespread ST changes, no different from before. Minimal increase in troponin is not unexpected and can be attributed to demand ischemia, not true acute coronary syndrome.  Acute respiratory failure with hypoxia -Likely secondary to CHF -improved with diuresis -Resolved -Presently stable on room air  Hypothyroidism -Continue levothyroxine   Disposition Plan:   Home when INR >2 Family Communication:   No Family at bedside  Consultants:  Cardiology, general surgery, CCM  Code Status:  FULL  DVT Prophylaxis:  IV Heparin/Coumadin   Procedures: As Listed in Progress Note Above  Antibiotics: None    Subjective: Patient still having some abdominal pain in the flank area but improving. Denies any fevers, chills, chest pain, short of breath, nausea, vomiting, diarrhea, dysuria, hematuria.  Objective: Vitals:   06/02/16 1243 06/02/16 2148 06/03/16 0631 06/03/16 1232  BP: (!) 100/52 116/76 102/72 (!) 100/44  Pulse: 80 88 83 86  Resp: 18 18 18 20   Temp: 97.4 F (36.3 C) 98.2 F (36.8 C) 97.7 F (36.5 C) 97.3 F (36.3 C)  TempSrc: Oral Oral Oral Axillary  SpO2: 97% 100% 98% 99%  Weight:   75.1 kg (165 lb 8 oz)   Height:        Intake/Output Summary (Last 24 hours) at 06/03/16 1837 Last data filed at 06/03/16 1235  Gross per 24 hour  Intake              906 ml  Output             1801 ml  Net             -  895 ml   Weight change: -1.542 kg (-3 lb 6.4 oz) Exam:   General:  Pt is alert, follows commands appropriately, not in acute distress  HEENT: No icterus, No thrush, No neck mass, [place]/AT  Cardiovascular: RRR, S1/S2, no rubs, no gallops  Respiratory: CTA bilaterally, no wheezing, no crackles, no rhonchi  Abdomen: Soft/+BS, non tender, non distended,Ecchymosis scattered in the bilateral flank area.  Extremities: 1 + LE edema, No lymphangitis, No petechiae, No rashes, no synovitis   Data Reviewed: I  have personally reviewed following labs and imaging studies Basic Metabolic Panel:  Recent Labs Lab 05/28/16 0500  05/28/16 1827  05/30/16 1635 05/31/16 0337 06/01/16 1202 06/02/16 0546 06/03/16 0439  NA 142  < > 136  < > 133* 133* 135 134* 136  K 2.3*  < > 3.0*  < > 3.6 3.3* 4.4 3.9 4.0  CL 114*  < > 95*  < > 98* 99* 100* 103 102  CO2 23  < > 31  < > 27 25 25 22 22   GLUCOSE 102*  < > 105*  < > 101* 116* 74 91 90  BUN 14  < > 19  < > 15 15 14 16 13   CREATININE 0.73  < > 0.92  < > 1.09* 1.00 1.00 1.05* 1.13*  CALCIUM 5.4*  < > 8.6*  < > 8.6* 8.5* 9.2 9.0 9.3  MG 1.1*  --  3.8*  --   --   --   --   --   --   < > = values in this interval not displayed. Liver Function Tests: No results for input(s): AST, ALT, ALKPHOS, BILITOT, PROT, ALBUMIN in the last 168 hours. No results for input(s): LIPASE, AMYLASE in the last 168 hours. No results for input(s): AMMONIA in the last 168 hours. Coagulation Profile:  Recent Labs Lab 05/30/16 0340 05/31/16 0337 06/01/16 0444 06/02/16 0546 06/03/16 0439  INR 1.03 0.98 1.07 1.07 1.12   CBC:  Recent Labs Lab 05/30/16 1635 05/31/16 0337 06/01/16 0444 06/02/16 0546 06/03/16 0439  WBC 8.4 8.4 7.6 8.0 7.8  HGB 11.7* 11.1* 11.5* 11.2* 11.8*  HCT 36.5 34.0* 35.4* 34.5* 36.6  MCV 89.0 88.8 89.4 89.6 89.5  PLT 394 379 386 434* 461*   Cardiac Enzymes: No results for input(s): CKTOTAL, CKMB, CKMBINDEX, TROPONINI in the last 168 hours. BNP: Invalid input(s): POCBNP CBG: No results for input(s): GLUCAP in the last 168 hours. HbA1C: No results for input(s): HGBA1C in the last 72 hours. Urine analysis: No results found for: COLORURINE, APPEARANCEUR, LABSPEC, [place], GLUCOSEU, HGBUR, BILIRUBINUR, KETONESUR, PROTEINUR, UROBILINOGEN, NITRITE, LEUKOCYTESUR Sepsis Labs: @LABRCNTIP (procalcitonin:4,lacticidven:4) ) Recent Results (from the past 240 hour(s))  MRSA PCR Screening     Status: None   Collection Time: 05/25/16  5:56 AM  Result  Value Ref Range Status   MRSA by PCR NEGATIVE NEGATIVE Final    Comment:        The GeneXpert MRSA Assay (FDA approved for NASAL specimens only), is one component of a comprehensive MRSA colonization surveillance program. It is not intended to diagnose MRSA infection nor to guide or monitor treatment for MRSA infections.   Culture, blood (routine x 2)     Status: None   Collection Time: 05/26/16 11:19 AM  Result Value Ref Range Status   Specimen Description BLOOD LEFT ARM  Final   Special Requests BOTTLES DRAWN AEROBIC ONLY 5CC  Final   Culture NO GROWTH 5 DAYS  Final   Report Status  05/31/2016 FINAL  Final  Culture, blood (routine x 2)     Status: None   Collection Time: 05/26/16 11:25 AM  Result Value Ref Range Status   Specimen Description BLOOD LEFT ARM  Final   Special Requests BOTTLES DRAWN AEROBIC ONLY 5CC  Final   Culture NO GROWTH 5 DAYS  Final   Report Status 05/31/2016 FINAL  Final  Culture, Urine     Status: None   Collection Time: 05/26/16  2:09 PM  Result Value Ref Range Status   Specimen Description URINE, CATHETERIZED  Final   Special Requests Normal  Final   Culture NO GROWTH  Final   Report Status 05/27/2016 FINAL  Final     Scheduled Meds: . docusate sodium  100 mg Oral BID  . furosemide  40 mg Oral BID  . levothyroxine  50 mcg Oral QAC breakfast  . pantoprazole  40 mg Oral Daily  . polyethylene glycol  17 g Oral Daily  . potassium chloride  20 mEq Oral BID  . Warfarin - Pharmacist Dosing Inpatient   Does not apply q1800   Continuous Infusions: . heparin 1,600 Units/hr (06/03/16 [id number])    Procedures/Studies: Ct Abdomen Pelvis Wo Contrast  Result Date: 05/28/2016 CLINICAL DATA:  60 year old female inpatient status post umbilical hernia repair [id number], complicated by large ventral abdominopelvic wall hematoma, presenting for follow-up. EXAM: CT ABDOMEN AND PELVIS WITHOUT CONTRAST TECHNIQUE: Multidetector CT imaging of the abdomen and pelvis was  performed following the standard protocol without IV contrast. COMPARISON:  05/24/2016 CT abdomen/pelvis. FINDINGS: Lower chest: Small layering right greater than left bilateral pleural effusions, new bilaterally. Mild compressive atelectasis in the dependent lower lobes bilaterally. Patchy mild ground-glass attenuation in the lower parahilar lungs bilaterally appears new. Stable top-normal heart size. Partially visualized aortic valve prosthesis is in place. Visualized lower sternotomy wires appear intact. Hepatobiliary: Normal liver with no liver mass. Cholecystectomy. No biliary ductal dilatation. Pancreas: Normal, with no mass or duct dilation. Spleen: Normal size. No mass. Adrenals/Urinary Tract: Normal adrenals. No right renal stones. Punctate nonobstructing 2 mm upper left renal stone. No hydronephrosis. No contour deforming renal mass. Normal caliber ureters. Bladder is completely collapsed by indwelling Foley catheter. No bladder stones or bladder wall thickening. Stomach/Bowel: Stable small to moderate hiatal hernia. Otherwise grossly normal stomach. Normal caliber small bowel with no small bowel wall thickening. Normal appendix. Normal large bowel with no diverticulosis, large bowel wall thickening or pericolonic fat stranding. Vascular/Lymphatic: Atherosclerotic nonaneurysmal abdominal aorta. No pathologically enlarged lymph nodes in the abdomen or pelvis. Reproductive: Grossly normal uterus.  No adnexal mass. Other: No pneumoperitoneum.  No ascites. Large left rectus sheath hematoma measures 9.7 x 4.0 x 14.7 cm, previously 8.6 x 5.0 x 14.7 cm on 05/24/2016 using similar measurement technique, not definitely changed, although with increased layering hyperdense blood products. Large left anterior pelvic extraperitoneal hematoma measures 13.8 x 10.1 x 8.9 cm (series 2/image 69), previously 11.0 x 8.4 x 8.3 cm using similar measurement technique, increased, with increased internal layering hyperdense blood  products. New left posterior paranephric space retroperitoneal 4.3 x 4.3 x 6.5 cm hematoma extending superiorly from the extraperitoneal left anterior pelvic hematoma (series 2/image 49). Midline extraperitoneal ventral abdominal 8.3 x 7.0 x 8.0 cm hematoma with layering hyperdense blood products (series 2/image 43), previously 8.2 x 7.0 x 7.9 cm, not appreciably changed. Deep subcutaneous hematoma in the left supraumbilical ventral abdominal wall with internal gas measures 8.2 x 2.6 x 6.0 cm (series 2/image 44), previously 8.2  x 2.8 by 6.2 cm using similar measurement technique, not appreciably changed. Musculoskeletal: No aggressive appearing focal osseous lesions. Mild thoracolumbar spondylosis . IMPRESSION: 1. Interval growth of large left anterior pelvic extraperitoneal hematoma as described. New left retroperitoneal hematoma extending superiorly from the left anterior pelvic extraperitoneal hematoma. Findings suggest ongoing extraperitoneal bleeding in the left anterior pelvis/ left retroperitoneum. 2. Large left rectus muscle sheath, extraperitoneal ventral midline abdominal and deep subcutaneous ventral abdominal wall hematomas appear stable. 3. New small bilateral pleural effusions, right greater than left. No ascites. 4. New patchy mild ground-glass attenuation in the lower parahilar lungs bilaterally, which could represent mild pulmonary edema or pneumonitis such as from aspiration. 5. Additional findings include aortic atherosclerosis, punctate nonobstructing left renal stone and small to moderate hiatal hernia. These results were called by telephone at the time of interpretation on 05/28/2016 at 12:11 pm to DR. DAN FEINSTEIN, who verbally acknowledged these results. Electronically Signed   By: Ilona Sorrel M.D.   On: 05/28/2016 12:16   Ct Abdomen Pelvis W Contrast  Result Date: 05/24/2016 CLINICAL DATA:  Left-sided abdominal pain with bruising and nausea since yesterday. Patient had umbilical  hernia surgery on 05/15/2016. EXAM: CT ABDOMEN AND PELVIS WITH CONTRAST TECHNIQUE: Multidetector CT imaging of the abdomen and pelvis was performed using the standard protocol following bolus administration of intravenous contrast. CONTRAST:  92mL ISOVUE-300 IOPAMIDOL (ISOVUE-300) INJECTION 61% COMPARISON:  06/28/2015 FINDINGS: Lower chest: Subsegmental atelectasis both lung bases. The heart is enlarged. Patient is status post CABG. Hepatobiliary: No focal abnormality within the liver parenchyma. Gallbladder surgically absent. Stable mild prominence extrahepatic bile ducts. Pancreas: No focal mass lesion. No dilatation of the main duct. No intraparenchymal cyst. No peripancreatic edema. Spleen: No splenomegaly. No focal mass lesion. Adrenals/Urinary Tract: No adrenal nodule or mass. Kidneys are unremarkable. No evidence for hydroureter. The urinary bladder appears normal for the degree of distention. Stomach/Bowel: Moderate hiatal hernia noted. Stomach otherwise unremarkable. Duodenum is normally positioned as is the ligament of Treitz. No small bowel wall thickening. No small bowel dilatation. The terminal ileum is normal. The appendix is normal. No gross colonic mass. No colonic wall thickening. No substantial diverticular change. Vascular/Lymphatic: There is abdominal aortic atherosclerosis without aneurysm. There is no gastrohepatic or hepatoduodenal ligament lymphadenopathy. No intraperitoneal or retroperitoneal lymphadenopathy. No pelvic sidewall lymphadenopathy. Reproductive: The uterus has normal CT imaging appearance. There is no adnexal mass. Other: Small volume free fluid seen around the liver, in both para colic gutters, and in the anatomic pelvis. Fluid around the liver averages 34 Hounsfield units, consistent with blood. Musculoskeletal: Complex multiloculated anterior abdominal wall hematoma is identified in the abdomen and pelvis. - 3 x 6 x 8 cm collection of blood, fluid, and air is identified in  the subcutaneous fat just to the left of the umbilicus. - 7 x 8 x 9 cm hematoma is identified in the midline abdomen. This is probably extraperitoneal given the focal nature of the hematoma, but loculated within the omentum cannot be entirely excluded. This hematoma appears to communicate with the hematoma described immediately below. - 6 x 9 x 11 cm hematoma is identified in the left rectus sheath and this appears to communicate with the hematoma listed below. - 8 x 10 x 9 cm hematoma in the space of Retzius Multiple areas of gas and fluid are seen in the subcutaneous fat of the anterior abdominal wall. Many of these were seen on the preoperative study from 06/28/2015 and may represent injection sites. Bone windows reveal  no worrisome lytic or sclerotic osseous lesions. IMPRESSION: 1. Multiloculated large hematoma of the anterior abdominal wall extends from the umbilicus down to the symphysis pubis. A Component contained in the left rectus sheath extends down into the space of Retzius. There appears to be communication between the rectus sheath hematoma and a midline hematoma which is probably extraperitoneal anteriorly between the transversalis fascia and the peritoneal lining given the focal appearance although containment within the omentum/gastrocolic ligament is possible but considered less likely. There is also a superficial component of the hematoma seen anterior to the rectus fascia. 2. Small volume hemoperitoneum. 3. Moderate hiatal hernia. Electronically Signed   By: Misty Stanley M.D.   On: 05/24/2016 14:22   Dg Chest Port 1 View  Result Date: 05/28/2016 CLINICAL DATA:  Acute respiratory failure EXAM: PORTABLE CHEST 1 VIEW COMPARISON:  05/27/2016 FINDINGS: 0436 hours. Low volume film. The cardio pericardial silhouette is enlarged. Pulmonary vascular congestion with interstitial and patchy basilar a (right greater than left) airspace disease persists without substantial change. Small bilateral  pleural effusions noted. The visualized bony structures of the thorax are intact. Telemetry leads overlie the chest. IMPRESSION: Cardiomegaly with vascular congestion and patchy bibasilar airspace disease shows no substantial change. Small bilateral pleural effusions. Electronically Signed   By: Misty Stanley M.D.   On: 05/28/2016 08:38   Dg Chest Port 1 View  Result Date: 05/27/2016 CLINICAL DATA:  Shortness of breath/respiratory failure EXAM: PORTABLE CHEST 1 VIEW COMPARISON:  May 26, 2016 FINDINGS: Central catheter tip is in the superior cava. No pneumothorax. There remains patchy airspace disease throughout the right mid lower lung zones. There is new consolidation in the medial left base. There is cardiomegaly with pulmonary vascularity within normal limits. Patient is status post aortic valve replacement. There is atherosclerotic calcification in the aorta. No adenopathy. No bone lesions. IMPRESSION: New consolidation medial left base. Patchy airspace opacity in the right middle lung zone regions remains. These areas are felt most likely represent multifocal pneumonia. Stable cardiac silhouette. There is aortic atherosclerosis. No evident pneumothorax. Electronically Signed   By: Lowella Grip III M.D.   On: 05/27/2016 08:06   Dg Chest Port 1 View  Result Date: 05/26/2016 CLINICAL DATA:  Status post central line placement ; history of CABG, aortic valve replacement EXAM: PORTABLE CHEST 1 VIEW COMPARISON:  Portable chest x-ray of May 26, 2016 at 9:56 a.m. FINDINGS: There has been interval placement of right internal jugular venous catheter. The tip overlies the midportion of the SVC. There is no postprocedure pneumothorax or hemo thorax. The lungs are mildly hypoinflated. The interstitial markings remain increased diffusely with areas of confluence. The cardiac silhouette remains enlarged. IMPRESSION: No postprocedure complication following right internal jugular venous catheter  placement. Electronically Signed   By: Story Vanvranken  [place] M.D.   On: 05/26/2016 14:04   Dg Chest Port 1 View  Result Date: 05/26/2016 CLINICAL DATA:  Shortness of breath, chest pain. History of coronary artery disease, aortic valve replacement for aortic stenosis, previous CVA, former smoker. EXAM: PORTABLE CHEST 1 VIEW COMPARISON:  PA and lateral chest x-ray of July 15, 2012 FINDINGS: The lungs are hypoinflated. The interstitial markings are diffusely increased. The pulmonary vascularity is engorged. The cardiac silhouette is enlarged. There is partial obscuration of the left hemidiaphragm. The sternal wires are intact. The prosthetic aortic valve ring is in stable position. There is calcification in the wall of the thoracic aortic arch. The bony thorax is unremarkable. IMPRESSION: CHF with pulmonary interstitial and  mild alveolar edema. No definite pneumonia. Thoracic aortic atherosclerosis. Electronically Signed   By: Micheala Morissette  [place] M.D.   On: 05/26/2016 10:18   Dg Abd Portable 1v  Result Date: 05/26/2016 CLINICAL DATA:  Abdominal distention EXAM: PORTABLE ABDOMEN - 1 VIEW COMPARISON:  CT from 2 days ago FINDINGS: Hazy density in the central abdomen with paucity of bowel loops correlating with large abdominal wall and extraperitoneal hematoma. No evidence of bowel obstruction. Cholecystectomy clips. IMPRESSION: Mass effect from known abdominal wall and extraperitoneal hematoma. Normal bowel gas pattern. Electronically Signed   By: Monte Fantasia M.D.   On: 05/26/2016 07:47    Dickie Cloe, DO  [facility] Pager [phone]  If 7PM-7AM, please contact night-coverage [email or web address] Password [facility] 06/03/2016, 6:37 PM   LOS: 10 days

## 2016-06-04 DIAGNOSIS — S301XXD Contusion of abdominal wall, subsequent encounter: Secondary | ICD-10-CM

## 2016-06-04 LAB — CBC
HCT: 36.3 % (ref 36.0–46.0)
HEMOGLOBIN: 11.5 g/dL — AB (ref 12.0–15.0)
MCH: 28.5 pg (ref 26.0–34.0)
MCHC: 31.7 g/dL (ref 30.0–36.0)
MCV: 89.9 fL (ref 78.0–100.0)
Platelets: 453 10*3/uL — ABNORMAL HIGH (ref 150–400)
RBC: 4.04 MIL/uL (ref 3.87–5.11)
RDW: 15.7 % — ABNORMAL HIGH (ref 11.5–15.5)
WBC: 7.3 10*3/uL (ref 4.0–10.5)

## 2016-06-04 LAB — PROTIME-INR
INR: 1.37
PROTHROMBIN TIME: 17 s — AB (ref 11.4–15.2)

## 2016-06-04 LAB — BASIC METABOLIC PANEL
ANION GAP: 9 (ref 5–15)
BUN: 15 mg/dL (ref 6–20)
CO2: 24 mmol/L (ref 22–32)
Calcium: 9.5 mg/dL (ref 8.9–10.3)
Chloride: 104 mmol/L (ref 101–111)
Creatinine, Ser: 1.17 mg/dL — ABNORMAL HIGH (ref 0.44–1.00)
GFR, EST AFRICAN AMERICAN: 58 mL/min — AB (ref >60)
GFR, EST NON AFRICAN AMERICAN: 50 mL/min — AB (ref >60)
Glucose, Bld: 99 mg/dL (ref 65–99)
POTASSIUM: 3.8 mmol/L (ref 3.5–5.1)
SODIUM: 137 mmol/L (ref 135–145)

## 2016-06-04 LAB — HEPARIN LEVEL (UNFRACTIONATED)
HEPARIN UNFRACTIONATED: 0.28 [IU]/mL — AB (ref 0.30–0.70)
HEPARIN UNFRACTIONATED: 0.93 [IU]/mL — AB (ref 0.30–0.70)
HEPARIN UNFRACTIONATED: 1.12 [IU]/mL — AB (ref 0.30–0.70)

## 2016-06-04 MED ORDER — HEPARIN (PORCINE) IN NACL 100-0.45 UNIT/ML-% IJ SOLN
1250.0000 [IU]/h | INTRAMUSCULAR | Status: DC
  Administered 2016-06-04 (×2): 1550 [IU]/h via INTRAVENOUS
  Filled 2016-06-04: qty 250

## 2016-06-04 MED ORDER — WARFARIN SODIUM 10 MG PO TABS
10.0000 mg | ORAL_TABLET | Freq: Once | ORAL | Status: AC
  Administered 2016-06-04: 10 mg via ORAL
  Filled 2016-06-04: qty 1

## 2016-06-04 MED ORDER — HEPARIN (PORCINE) IN NACL 100-0.45 UNIT/ML-% IJ SOLN
1050.0000 [IU]/h | INTRAMUSCULAR | Status: DC
  Administered 2016-06-04 – 2016-06-05 (×2): 1250 [IU]/h via INTRAVENOUS
  Administered 2016-06-06: 1050 [IU]/h via INTRAVENOUS
  Filled 2016-06-04 (×3): qty 250

## 2016-06-04 NOTE — Progress Notes (Signed)
ANTICOAGULATION CONSULT NOTE - Follow Up Consult  Pharmacy Consult for heparin Indication: AVR  Labs:  Recent Labs  06/01/16 1202  06/02/16 0546 06/03/16 0439 06/03/16 1310 06/04/16 0519  HGB  --   < > 11.2* 11.8*  --  11.5*  HCT  --   --  34.5* 36.6  --  36.3  PLT  --   --  434* 461*  --  453*  LABPROT  --   --  13.9 14.4  --  17.0*  INR  --   --  1.07 1.12  --  1.37  HEPARINUNFRC  --   < > 0.67 0.17* 0.51 0.28*  CREATININE 1.00  --  1.05* 1.13*  --   --   < > = values in this interval not displayed.   Assessment: 60yo female now slightly subtherapeutic on heparin after one level at goal; RN reports IV line has been beeping frequently overnight but no other issues.  Goal of Therapy:  Heparin level 0.3-0.7 units/ml   Plan:  Will increase heparin gtt slightly to 1700 units/hr and check level in [place], PharmD, BCPS  06/04/2016,6:16 AM

## 2016-06-04 NOTE — Progress Notes (Signed)
[place] for heparin / warfarin  Indication: Mechanical AVR  No Known Allergies  Patient Measurements: Height: 5\' 2"  (157.5 cm) Weight: 164 lb (74.4 kg) (Scale B) IBW/kg (Calculated) : 50.1 Heparin Dosing Weight: 67 kg  Vital Signs: Temp: 97.8 F (36.6 C) (11/23 1221) Temp Source: Oral (11/23 1221) BP: 107/40 (11/23 1221) Pulse Rate: 73 (11/23 1221)  Labs:  Recent Labs  06/02/16 0546 06/03/16 0439 06/03/16 1310 06/04/16 0519 06/04/16 1140  HGB 11.2* 11.8*  --  11.5*  --   HCT 34.5* 36.6  --  36.3  --   PLT 434* 461*  --  453*  --   LABPROT 13.9 14.4  --  17.0*  --   INR 1.07 1.12  --  1.37  --   HEPARINUNFRC 0.67 0.17* 0.51 0.28* 0.93*  CREATININE 1.05* 1.13*  --  1.17*  --     Estimated Creatinine Clearance: 48.9 mL/min (by C-G formula based on SCr of 1.17 mg/dL (H)).   Medical History: Past Medical History:  Diagnosis Date  . Aortic stenosis    Aortic valve replacement 2006  . CAD (coronary artery disease)    a. s/p CABG 2006 at time of AVR.  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 Dyslipidemia   . Gallstones   . GERD (gastroesophageal reflux disease)   . Heart murmur   . Hx of CABG    2006,LIMA to LAD, SVG to diagonal  . Hx of transfusion of packed red blood cells   . Hypertension   . Hypothyroidism   . Iron deficiency anemia   . Lower GI bleed 06/2015  . Pericardial effusion    Insignificant small pericardial effusion seen on echo, November, 2013  . Peripheral vascular disease ([place]) 06   blood clot -stroke  . S/P aortic valve replacement    a. Bentall procedure,#21 [employer] mechanical valve conduit with reimplantation of the coronaries 2006  . Status post colonoscopy with polypectomy    "bleeding; colonoscopy was ~ 06/18/2015"  . Stroke [facility]) 2006   no deficits   . Warfarin anticoagulation    coumadin therapy     Assessment: 60 yo female with mechanical AVR, she  was bridged perioperatively with Lovenox as an outpatient but developed multiple abdominal wall/rectus sheath hematomas and had a 5 g drop in her hemoglobin. She has received multiple units of pRBC and FFP throughout her admission.All anticoagulation was resumed 11/18, now overcoming vitamin K resistance  INR today = 1.37. It's finally trending up. Hep level is elevated this AM. Pt was having some pump issue and this may have led to the elevated level. She got a new module now. We will decrease the rate.    PTA warfarin: 4.5 mg qFri, 3 mg all other days  Goal of Therapy:  Heparin level 0.3-0.7 units/ml Monitor platelets by anticoagulation protocol: Yes INR 2-3  Plan:   Decrease heparin to 1550 units/hr Check 6 hr hep level Coumadin 10 mg po x 1 dose today Daily INR heparin level, CBC  Onnie Boer, PharmD, BCPS, AAHIVP, CPP Infectious Disease Pharmacist Pager: [phone] 06/04/2016 1:44 PM

## 2016-06-04 NOTE — Progress Notes (Addendum)
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  She underwent a lap-assisted ventral hernia repair by Dr. Hassell Done on 11/3. The surgery and her post-op course were uncomplicated. She was discharged on 11/5 on a lovenox bridge and coumadin, which she takes for her mechanical aortic valve. She has had evolving abdominal wall bruising since surgery, but on 05/24/16 when she looked in the mirror felt her abdomen to be more swollen, and has had worsening abdominal wall and back pain, particularly on the left abdominal wall. She has nausea and generally felt ill, so presented to the ER for evaluation. She was noted to have a 5-point drop in her hemoglobin since it was last checked 5 days prior to admisstion.  CT 05/28/16 showed enlarging hematoma and anticoagulation was reversed with FFP and vit K.  Assessment/Plan: Acute on chronic diastolic CHF -Continue furosemide under direction of cardiology -Admission weight 173 pounds -06/04/2016 weight 164 pounds -question I/O accuracy  Mechanical AVR: - INR 1.12. With her type of valve, risk of thrombosis is not extremely high. On IV heparin without bolus and warfarin until INR>2.0  Rectus sheath hematoma:  -Hgb 11 and stable after last transfusion.  Acute Blood Loss Anemia -baseline Hgb ~11 -Hgb nadir 7.0 -transfused 5 units PRBC  AKI -Secondary  to hemodynamic changes -Resolved  CAD s/p CABG:  -no angina. Had chronic widespread ST changes, no different from before. Minimal increase in troponin is not unexpected and can be attributed to demand ischemia, not true acute coronary syndrome.  Acute respiratory failure with hypoxia -Likely secondary to CHF -improved with diuresis -Resolved -Presently stable on room air  Hypothyroidism -Continue levothyroxine   Disposition Plan:   Home when INR >2 Family Communication:  Husband updated at bedside 11/23  Consultants:  Cardiology, general surgery, CCM  Code Status:  FULL  DVT Prophylaxis:  IV Heparin/Coumadin   Procedures: As Listed in Progress Note Above  Antibiotics: None    Subjective: Patient denies fevers, chills, headache, chest pain, dyspnea, nausea, vomiting, diarrhea, abdominal pain, dysuria, hematuria, hematochezia, and melena.   Objective: Vitals:   06/03/16 1232 06/03/16 1859 06/04/16 0505 06/04/16 1221  BP: (!) 100/44 103/61 (!) 114/58 (!) 107/40  Pulse: 86 77 74 73  Resp: 20 20 19 18   Temp: 97.3 F (36.3 C) 97.6 F (36.4 C) 97.3 F (36.3 C) 97.8 F (36.6 C)  TempSrc: Axillary Oral Oral Oral  SpO2: 99% 100% 99% 100%  Weight:   74.4 kg (164 lb)   Height:        Intake/Output Summary (Last 24 hours) at 06/04/16 1701 Last data filed at 06/04/16 1300  Gross per 24 hour  Intake           1606.8 ml  Output              400 ml  Net           1206.8 ml  Weight change: -0.68 kg (-1 lb 8 oz) Exam:   General:  Pt is alert, follows commands appropriately, not in acute distress  HEENT: No icterus, No thrush, No neck mass, [place]/AT  Cardiovascular: RRR, S1/S2, no rubs, no gallops  Respiratory: CTA bilaterally, no wheezing, no crackles, no rhonchi  Abdomen: Soft/+BS, non tender, non distended, no guarding  Extremities: 1 + LE edema, No lymphangitis, No petechiae, No rashes, no synovitis   Data Reviewed: I have personally reviewed  following labs and imaging studies Basic Metabolic Panel:  Recent Labs Lab 05/28/16 1827  05/31/16 0337 06/01/16 1202 06/02/16 0546 06/03/16 0439 06/04/16 0519  NA 136  < > 133* 135 134* 136 137  K 3.0*  < > 3.3* 4.4 3.9 4.0 3.8  CL 95*  < > 99* 100* 103 102 104  CO2 31  < > 25 25 22 22 24   GLUCOSE 105*  < > 116* 74 91 90 99  BUN 19  < > 15 14 16 13 15   CREATININE 0.92  < > 1.00 1.00 1.05* 1.13* 1.17*  CALCIUM 8.6*  < > 8.5* 9.2 9.0 9.3 9.5  MG 3.8*  --   --   --   --   --   --   < > = values in this interval not displayed. Liver Function Tests: No results for input(s): AST, ALT, ALKPHOS, BILITOT, PROT, ALBUMIN in the last 168 hours. No results for input(s): LIPASE, AMYLASE in the last 168 hours. No results for input(s): AMMONIA in the last 168 hours. Coagulation Profile:  Recent Labs Lab 05/31/16 0337 06/01/16 0444 06/02/16 0546 06/03/16 0439 06/04/16 0519  INR 0.98 1.07 1.07 1.12 1.37   CBC:  Recent Labs Lab 05/31/16 0337 06/01/16 0444 06/02/16 0546 06/03/16 0439 06/04/16 0519  WBC 8.4 7.6 8.0 7.8 7.3  HGB 11.1* 11.5* 11.2* 11.8* 11.5*  HCT 34.0* 35.4* 34.5* 36.6 36.3  MCV 88.8 89.4 89.6 89.5 89.9  PLT 379 386 434* 461* 453*   Cardiac Enzymes: No results for input(s): CKTOTAL, CKMB, CKMBINDEX, TROPONINI in the last 168 hours. BNP: Invalid input(s): POCBNP CBG: No results for input(s): GLUCAP in the last 168 hours. HbA1C: No results for input(s): HGBA1C in the last 72 hours. Urine analysis: No results found for: COLORURINE, APPEARANCEUR, LABSPEC, [place], GLUCOSEU, HGBUR, BILIRUBINUR, [place], [place], [place], NITRITE, LEUKOCYTESUR Sepsis Labs: @LABRCNTIP (procalcitonin:4,lacticidven:4) ) Recent Results (from the past 240 hour(s))  Culture, blood (routine x 2)     Status: None   Collection Time: 05/26/16 11:19 AM  Result Value Ref Range Status   Specimen Description BLOOD LEFT ARM  Final   Special Requests BOTTLES DRAWN AEROBIC ONLY 5CC   Final   Culture NO GROWTH 5 DAYS  Final   Report Status 05/31/2016 FINAL  Final  Culture, blood (routine x 2)     Status: None   Collection Time: 05/26/16 11:25 AM  Result Value Ref Range Status   Specimen Description BLOOD LEFT ARM  Final   Special Requests BOTTLES DRAWN AEROBIC ONLY 5CC  Final   Culture NO GROWTH 5 DAYS  Final   Report Status 05/31/2016 FINAL  Final  Culture, Urine     Status: None   Collection Time: 05/26/16  2:09 PM  Result Value Ref Range Status   Specimen Description URINE, CATHETERIZED  Final   Special Requests Normal  Final   Culture NO GROWTH  Final   Report Status 05/27/2016 FINAL  Final     Scheduled Meds: . docusate sodium  100 mg Oral BID  . furosemide  40 mg Oral BID  . levothyroxine  50 mcg Oral QAC breakfast  . pantoprazole  40 mg Oral Daily  . polyethylene glycol  17 g Oral Daily  . potassium chloride  20 mEq Oral BID  . Warfarin - Pharmacist Dosing Inpatient   Does not apply q1800   Continuous Infusions: . heparin 1,550 Units/hr (06/04/16 1606)    Procedures/Studies: Ct Abdomen Pelvis Wo Contrast  Result Date: 05/28/2016 CLINICAL DATA:  60 year old female inpatient status post umbilical hernia repair [id number], complicated by large ventral abdominopelvic wall hematoma, presenting for follow-up. EXAM: CT ABDOMEN AND PELVIS WITHOUT CONTRAST TECHNIQUE: Multidetector CT imaging of the abdomen and pelvis was performed following the standard protocol without IV contrast. COMPARISON:  05/24/2016 CT abdomen/pelvis. FINDINGS: Lower chest: Small layering right greater than left bilateral pleural effusions, new bilaterally. Mild compressive atelectasis in the dependent lower lobes bilaterally. Patchy mild ground-glass attenuation in the lower parahilar lungs bilaterally appears new. Stable top-normal heart size. Partially visualized aortic valve prosthesis is in place. Visualized lower sternotomy wires appear intact. Hepatobiliary: Normal liver with no  liver mass. Cholecystectomy. No biliary ductal dilatation. Pancreas: Normal, with no mass or duct dilation. Spleen: Normal size. No mass. Adrenals/Urinary Tract: Normal adrenals. No right renal stones. Punctate nonobstructing 2 mm upper left renal stone. No hydronephrosis. No contour deforming renal mass. Normal caliber ureters. Bladder is completely collapsed by indwelling Foley catheter. No bladder stones or bladder wall thickening. Stomach/Bowel: Stable small to moderate hiatal hernia. Otherwise grossly normal stomach. Normal caliber small bowel with no small bowel wall thickening. Normal appendix. Normal large bowel with no diverticulosis, large bowel wall thickening or pericolonic fat stranding. Vascular/Lymphatic: Atherosclerotic nonaneurysmal abdominal aorta. No pathologically enlarged lymph nodes in the abdomen or pelvis. Reproductive: Grossly normal uterus.  No adnexal mass. Other: No pneumoperitoneum.  No ascites. Large left rectus sheath hematoma measures 9.7 x 4.0 x 14.7 cm, previously 8.6 x 5.0 x 14.7 cm on 05/24/2016 using similar measurement technique, not definitely changed, although with increased layering hyperdense blood products. Large left anterior pelvic extraperitoneal hematoma measures 13.8 x 10.1 x 8.9 cm (series 2/image 69), previously 11.0 x 8.4 x 8.3 cm using similar measurement technique, increased, with increased internal layering hyperdense blood products. New left posterior paranephric space retroperitoneal 4.3 x 4.3 x 6.5 cm hematoma extending superiorly from the extraperitoneal left anterior pelvic hematoma (series 2/image 49). Midline extraperitoneal ventral abdominal 8.3 x 7.0 x 8.0 cm hematoma with layering hyperdense blood products (series 2/image 43), previously 8.2 x 7.0 x 7.9 cm, not appreciably changed. Deep subcutaneous hematoma in the left supraumbilical ventral abdominal wall with internal gas measures 8.2 x 2.6 x 6.0 cm (series 2/image 44), previously 8.2 x 2.8 by 6.2 cm  using similar measurement technique, not appreciably changed. Musculoskeletal: No aggressive appearing focal osseous lesions. Mild thoracolumbar spondylosis . IMPRESSION: 1. Interval growth of large left anterior pelvic extraperitoneal hematoma as described. New left retroperitoneal hematoma extending superiorly from the left anterior pelvic extraperitoneal hematoma. Findings suggest ongoing extraperitoneal bleeding in the left anterior pelvis/ left retroperitoneum. 2. Large left rectus muscle sheath, extraperitoneal ventral midline abdominal and deep subcutaneous ventral abdominal wall hematomas appear stable. 3. New small bilateral pleural effusions, right greater than left. No ascites. 4. New patchy mild ground-glass attenuation in the lower parahilar lungs bilaterally, which could represent mild pulmonary edema or pneumonitis such as from aspiration. 5. Additional findings include aortic atherosclerosis, punctate nonobstructing left renal stone and small  to moderate hiatal hernia. These results were called by telephone at the time of interpretation on 05/28/2016 at 12:11 pm to DR. DAN FEINSTEIN, who verbally acknowledged these results. Electronically Signed   By: Ilona Sorrel M.D.   On: 05/28/2016 12:16   Ct Abdomen Pelvis W Contrast  Result Date: 05/24/2016 CLINICAL DATA:  Left-sided abdominal pain with bruising and nausea since yesterday. Patient had umbilical hernia surgery on 05/15/2016. EXAM: CT ABDOMEN AND PELVIS WITH CONTRAST TECHNIQUE: Multidetector CT imaging of the abdomen and pelvis was performed using the standard protocol following bolus administration of intravenous contrast. CONTRAST:  50mL ISOVUE-300 IOPAMIDOL (ISOVUE-300) INJECTION 61% COMPARISON:  06/28/2015 FINDINGS: Lower chest: Subsegmental atelectasis both lung bases. The heart is enlarged. Patient is status post CABG. Hepatobiliary: No focal abnormality within the liver parenchyma. Gallbladder surgically absent. Stable mild prominence  extrahepatic bile ducts. Pancreas: No focal mass lesion. No dilatation of the main duct. No intraparenchymal cyst. No peripancreatic edema. Spleen: No splenomegaly. No focal mass lesion. Adrenals/Urinary Tract: No adrenal nodule or mass. Kidneys are unremarkable. No evidence for hydroureter. The urinary bladder appears normal for the degree of distention. Stomach/Bowel: Moderate hiatal hernia noted. Stomach otherwise unremarkable. Duodenum is normally positioned as is the ligament of Treitz. No small bowel wall thickening. No small bowel dilatation. The terminal ileum is normal. The appendix is normal. No gross colonic mass. No colonic wall thickening. No substantial diverticular change. Vascular/Lymphatic: There is abdominal aortic atherosclerosis without aneurysm. There is no gastrohepatic or hepatoduodenal ligament lymphadenopathy. No intraperitoneal or retroperitoneal lymphadenopathy. No pelvic sidewall lymphadenopathy. Reproductive: The uterus has normal CT imaging appearance. There is no adnexal mass. Other: Small volume free fluid seen around the liver, in both para colic gutters, and in the anatomic pelvis. Fluid around the liver averages 34 Hounsfield units, consistent with blood. Musculoskeletal: Complex multiloculated anterior abdominal wall hematoma is identified in the abdomen and pelvis. - 3 x 6 x 8 cm collection of blood, fluid, and air is identified in the subcutaneous fat just to the left of the umbilicus. - 7 x 8 x 9 cm hematoma is identified in the midline abdomen. This is probably extraperitoneal given the focal nature of the hematoma, but loculated within the omentum cannot be entirely excluded. This hematoma appears to communicate with the hematoma described immediately below. - 6 x 9 x 11 cm hematoma is identified in the left rectus sheath and this appears to communicate with the hematoma listed below. - 8 x 10 x 9 cm hematoma in the space of Retzius Multiple areas of gas and fluid are seen in  the subcutaneous fat of the anterior abdominal wall. Many of these were seen on the preoperative study from 06/28/2015 and may represent injection sites. Bone windows reveal no worrisome lytic or sclerotic osseous lesions. IMPRESSION: 1. Multiloculated large hematoma of the anterior abdominal wall extends from the umbilicus down to the symphysis pubis. A Component contained in the left rectus sheath extends down into the space of Retzius. There appears to be communication between the rectus sheath hematoma and a midline hematoma which is probably extraperitoneal anteriorly between the transversalis fascia and the peritoneal lining given the focal appearance although containment within the omentum/gastrocolic ligament is possible but considered less likely. There is also a superficial component of the hematoma seen anterior to the rectus fascia. 2. Small volume hemoperitoneum. 3. Moderate hiatal hernia. Electronically Signed   By: Misty Stanley M.D.   On: 05/24/2016 14:22   Dg Chest [facility]  Result Date: 05/28/2016 CLINICAL DATA:  Acute respiratory failure EXAM: PORTABLE CHEST 1 VIEW COMPARISON:  05/27/2016 FINDINGS: 0436 hours. Low volume film. The cardio pericardial silhouette is enlarged. Pulmonary vascular congestion with interstitial and patchy basilar a (right greater than left) airspace disease persists without substantial change. Small bilateral pleural effusions noted. The visualized bony structures of the thorax are intact. Telemetry leads overlie the chest. IMPRESSION: Cardiomegaly with vascular congestion and patchy bibasilar airspace disease shows no substantial change. Small bilateral pleural effusions. Electronically Signed   By: Misty Stanley M.D.   On: 05/28/2016 08:38   Dg Chest Port 1 View  Result Date: 05/27/2016 CLINICAL DATA:  Shortness of breath/respiratory failure EXAM: PORTABLE CHEST 1 VIEW COMPARISON:  May 26, 2016 FINDINGS: Central catheter tip is in the superior cava. No  pneumothorax. There remains patchy airspace disease throughout the right mid lower lung zones. There is new consolidation in the medial left base. There is cardiomegaly with pulmonary vascularity within normal limits. Patient is status post aortic valve replacement. There is atherosclerotic calcification in the aorta. No adenopathy. No bone lesions. IMPRESSION: New consolidation medial left base. Patchy airspace opacity in the right middle lung zone regions remains. These areas are felt most likely represent multifocal pneumonia. Stable cardiac silhouette. There is aortic atherosclerosis. No evident pneumothorax. Electronically Signed   By: Lowella Grip III M.D.   On: 05/27/2016 08:06   Dg Chest Port 1 View  Result Date: 05/26/2016 CLINICAL DATA:  Status post central line placement ; history of CABG, aortic valve replacement EXAM: PORTABLE CHEST 1 VIEW COMPARISON:  Portable chest x-ray of May 26, 2016 at 9:56 a.m. FINDINGS: There has been interval placement of right internal jugular venous catheter. The tip overlies the midportion of the SVC. There is no postprocedure pneumothorax or hemo thorax. The lungs are mildly hypoinflated. The interstitial markings remain increased diffusely with areas of confluence. The cardiac silhouette remains enlarged. IMPRESSION: No postprocedure complication following right internal jugular venous catheter placement. Electronically Signed   By: Fortune Torosian  [place] M.D.   On: 05/26/2016 14:04   Dg Chest Port 1 View  Result Date: 05/26/2016 CLINICAL DATA:  Shortness of breath, chest pain. History of coronary artery disease, aortic valve replacement for aortic stenosis, previous CVA, former smoker. EXAM: PORTABLE CHEST 1 VIEW COMPARISON:  PA and lateral chest x-ray of July 15, 2012 FINDINGS: The lungs are hypoinflated. The interstitial markings are diffusely increased. The pulmonary vascularity is engorged. The cardiac silhouette is enlarged. There is partial obscuration  of the left hemidiaphragm. The sternal wires are intact. The prosthetic aortic valve ring is in stable position. There is calcification in the wall of the thoracic aortic arch. The bony thorax is unremarkable. IMPRESSION: CHF with pulmonary interstitial and mild alveolar edema. No definite pneumonia. Thoracic aortic atherosclerosis. Electronically Signed   By: Irelynn Schermerhorn  [place] M.D.   On: 05/26/2016 10:18   Dg Abd Portable 1v  Result Date: 05/26/2016 CLINICAL DATA:  Abdominal distention EXAM: PORTABLE ABDOMEN - 1 VIEW COMPARISON:  CT from 2 days ago FINDINGS: Hazy density in the central abdomen with paucity of bowel loops correlating with large abdominal wall and extraperitoneal hematoma. No evidence of bowel obstruction. Cholecystectomy clips. IMPRESSION: Mass effect from known abdominal wall and extraperitoneal hematoma. Normal bowel gas pattern. Electronically Signed   By: Monte Fantasia M.D.   On: 05/26/2016 07:47    Zaila Crew, DO  [facility] Pager [phone]  If 7PM-7AM, please contact night-coverage [email or web address] Password [email or web address] 06/04/2016, 5:01 PM  LOS: 11 days

## 2016-06-04 NOTE — Progress Notes (Signed)
Patient Name: Elizabeth Morton Date of Encounter: 06/04/2016  Primary Cardiologist: [facility] Problem List     Principal Problem:   Acute on chronic diastolic congestive heart failure [facility]) Active Problems:   Warfarin anticoagulation   S/P aortic valve replacement   Acute blood loss anemia   S/P repair of ventral hernia Nov 2017   Rectus sheath hematoma, initial encounter   Hypotension   Acute respiratory failure (HCC)   Abdominal distension   Bleeding   Pulmonary edema   Rectus sheath hematoma, subsequent encounter   Subjective   Feeling depressed from being here for too long, otherwise improved SOB. INR 1.1 today - need to overcome Vit K with warfarin dosing.  Inpatient Medications    Scheduled Meds: . docusate sodium  100 mg Oral BID  . furosemide  40 mg Oral BID  . levothyroxine  50 mcg Oral QAC breakfast  . pantoprazole  40 mg Oral Daily  . polyethylene glycol  17 g Oral Daily  . potassium chloride  20 mEq Oral BID  . Warfarin - Pharmacist Dosing Inpatient   Does not apply q1800   Continuous Infusions: . heparin 1,700 Units/hr (06/04/16 0625)   PRN Meds: acetaminophen, HYDROmorphone (DILAUDID) injection, ipratropium-albuterol, ondansetron **OR** ondansetron (ZOFRAN) IV, sodium chloride flush, sodium chloride flush   Vital Signs    Vitals:   06/03/16 0631 06/03/16 1232 06/03/16 1859 06/04/16 0505  BP: 102/72 (!) 100/44 103/61 (!) 114/58  Pulse: 83 86 77 74  Resp: 18 20 20 19   Temp: 97.7 F (36.5 C) 97.3 F (36.3 C) 97.6 F (36.4 C) 97.3 F (36.3 C)  TempSrc: Oral Axillary Oral Oral  SpO2: 98% 99% 100% 99%  Weight: 165 lb 8 oz (75.1 kg)   164 lb (74.4 kg)  Height:        Intake/Output Summary (Last 24 hours) at 06/04/16 0902 Last data filed at 06/04/16 0841  Gross per 24 hour  Intake           1691.8 ml  Output              701 ml  Net            990.8 ml   Filed Weights   06/02/16 0552 06/03/16 0631 06/04/16 0505  Weight: 168 lb  14.4 oz (76.6 kg) 165 lb 8 oz (75.1 kg) 164 lb (74.4 kg)    Physical Exam   Comfortable, smiling GEN: Well nourished, well developed, in no acute distress.  HEENT: Grossly normal.  Neck: Supple, no JVD, carotid bruits, or masses. Cardiac: RRR, crisp prosthetic valve clicks, 1/6 Ao ejection murmur, no diastolic murmurs, rubs, or gallops. No clubbing, cyanosis, mild LE edema.  Radials/DP/PT 2+ and equal bilaterally.  Respiratory:  Respirations regular and unlabored, clear to auscultation bilaterally. GI: Soft, tender in L flank, nondistended, BS + x 4. MS: no deformity or atrophy. Skin: warm and dry, no rash. Neuro:  Strength and sensation are intact. Psych: AAOx3.  Normal affect.  Labs    CBC  Recent Labs  06/03/16 0439 06/04/16 0519  WBC 7.8 7.3  HGB 11.8* 11.5*  HCT 36.6 36.3  MCV 89.5 89.9  PLT 461* 0000000*   Basic Metabolic Panel  Recent Labs  06/03/16 0439 06/04/16 0519  NA 136 137  K 4.0 3.8  CL 102 104  CO2 22 24  GLUCOSE 90 99  BUN 13 15  CREATININE 1.13* 1.17*  CALCIUM 9.3 9.5     Telemetry  NSR - Personally Reviewed  ECHO    TTE: 05/29/16  Study Conclusions  - Procedure narrative: Transthoracic echocardiography. Image   quality was adequate. The study was technically difficult. - Left ventricle: The cavity size was normal. Wall thickness was   increased in a pattern of mild LVH. Systolic function was normal.   The estimated ejection fraction was in the range of 55% to 60%.   Features are consistent with a pseudonormal left ventricular   filling pattern, with concomitant abnormal relaxation and   increased filling pressure (grade 2 diastolic dysfunction). - Aortic valve: A bioprosthesis was present. There was mild   regurgitation. - Mitral valve: There was mild regurgitation. - Pulmonary arteries: Systolic pressure was mildly increased. PA   peak pressure: 38 mm Hg (S).  Radiology    No results found.   Patient Profile     60 yo  woman with rectus sheath hematoma during enoxaparin-warfarin "bridging" for mechanical AVR following ventral hernia repair, complicated by moderate acute blood loss anemia, mild acute renal insufficiency and acute exacerbation of chronic diastolic heart failure following volume resuscitation. Background problems include CAD s/p CABG and moderately elevated AV prosthesis gradients. CT 05/28/16 showed enlarging hematoma and anticoagulation was reversed with FFP and vit K.   Assessment & Plan    1. Acute on chronic diastolic CHF: she appears clinically euvolemic , weight is down 171>>164 lbs. Daily weights and strict in/out.    2. Mechanical AVR: INR 1.37. With her type of valve, risk of thrombosis is not extremely high. On IV heparin without bolus and warfarin until INR>2.0. I have called the pharmacy as her INR is not moving, they will review coumadin dose.  3. Rectus sheath hematoma: Hgb 11 and stable after last transfusion.  4. ARF: resolved.   5.CAD s/p CABG: no angina. Had chronic widespread ST changes, no different from before. Minimal increase in troponin is not unexpected and can be attributed to demand ischemia, not true acute coronary syndrome.  6. Hypokalemia: Resolved  Able to be discharged from a cardiology standpoint when INR >2. Today still 1.4.  Ena Dawley, MD 06/04/2016, 9:02 AM

## 2016-06-04 NOTE — Progress Notes (Signed)
[place] for heparin / warfarin  Indication: Mechanical AVR  No Known Allergies  Patient Measurements: Height: 5\' 2"  (157.5 cm) Weight: 164 lb (74.4 kg) (Scale B) IBW/kg (Calculated) : 50.1 Heparin Dosing Weight: 67 kg  Vital Signs: Temp: 97.7 F (36.5 C) (11/23 2108) Temp Source: Oral (11/23 2108) BP: 118/73 (11/23 2108) Pulse Rate: 80 (11/23 2108)  Labs:  Recent Labs  06/02/16 0546 06/03/16 0439  06/04/16 0519 06/04/16 1140 06/04/16 2042  HGB 11.2* 11.8*  --  11.5*  --   --   HCT 34.5* 36.6  --  36.3  --   --   PLT 434* 461*  --  453*  --   --   LABPROT 13.9 14.4  --  17.0*  --   --   INR 1.07 1.12  --  1.37  --   --   HEPARINUNFRC 0.67 0.17*  < > 0.28* 0.93* 1.12*  CREATININE 1.05* 1.13*  --  1.17*  --   --   < > = values in this interval not displayed.  Estimated Creatinine Clearance: 48.9 mL/min (by C-G formula based on SCr of 1.17 mg/dL (H)).   Medical History: Past Medical History:  Diagnosis Date  . Aortic stenosis    Aortic valve replacement 2006  . CAD (coronary artery disease)    a. s/p CABG 2006 at time of AVR.  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 Dyslipidemia   . Gallstones   . GERD (gastroesophageal reflux disease)   . Heart murmur   . Hx of CABG    2006,LIMA to LAD, SVG to diagonal  . Hx of transfusion of packed red blood cells   . Hypertension   . Hypothyroidism   . Iron deficiency anemia   . Lower GI bleed 06/2015  . Pericardial effusion    Insignificant small pericardial effusion seen on echo, November, 2013  . Peripheral vascular disease ([place]) 06   blood clot -stroke  . S/P aortic valve replacement    a. Bentall procedure,#21 [employer] mechanical valve conduit with reimplantation of the coronaries 2006  . Status post colonoscopy with polypectomy    "bleeding; colonoscopy was ~ 06/18/2015"  . Stroke [facility]) 2006   no deficits   . Warfarin anticoagulation     coumadin therapy     Assessment: 60 yo female with mechanical AVR, she was bridged perioperatively with Lovenox as an outpatient but developed multiple abdominal wall/rectus sheath hematomas and had a 5 g drop in her hemoglobin. She has received multiple units of pRBC and FFP throughout her admission. All anticoagulation was resumed 11/18, now overcoming vitamin K resistance.  Heparin level remains elevated after rate decrease to 1550 units/h.  No IV line or bleeding issues noted per RN. Communicated plan with RN.   Goal of Therapy:  Heparin level 0.3-0.7 units/ml Monitor platelets by anticoagulation protocol: Yes INR 2-3  Plan:  Hold heparin infusion x 1 hour Decrease heparin to 1250 units/hr - to start at 2330 Check 6 hr hep level Daily INR/heparin level/CBC Monitor for s/sx bleeding   Elicia Lamp, PharmD, BCPS Clinical Pharmacist 06/04/2016 10:17 PM

## 2016-06-05 LAB — CBC
HEMATOCRIT: 35.4 % — AB (ref 36.0–46.0)
HEMOGLOBIN: 11.4 g/dL — AB (ref 12.0–15.0)
MCH: 29.2 pg (ref 26.0–34.0)
MCHC: 32.2 g/dL (ref 30.0–36.0)
MCV: 90.5 fL (ref 78.0–100.0)
Platelets: 417 10*3/uL — ABNORMAL HIGH (ref 150–400)
RBC: 3.91 MIL/uL (ref 3.87–5.11)
RDW: 15.8 % — AB (ref 11.5–15.5)
WBC: 5.6 10*3/uL (ref 4.0–10.5)

## 2016-06-05 LAB — PROTIME-INR
INR: 1.74
PROTHROMBIN TIME: 20.6 s — AB (ref 11.4–15.2)

## 2016-06-05 LAB — HEPARIN LEVEL (UNFRACTIONATED)
HEPARIN UNFRACTIONATED: 0.67 [IU]/mL (ref 0.30–0.70)
HEPARIN UNFRACTIONATED: 0.79 [IU]/mL — AB (ref 0.30–0.70)
HEPARIN UNFRACTIONATED: 0.48 [IU]/mL (ref 0.30–0.70)

## 2016-06-05 MED ORDER — WARFARIN SODIUM 7.5 MG PO TABS: 7.5000 mg | ORAL_TABLET | Freq: Once | ORAL | Status: DC

## 2016-06-05 MED ORDER — FUROSEMIDE 10 MG/ML IJ SOLN
40.0000 mg | Freq: Two times a day (BID) | INTRAMUSCULAR | Status: AC
  Administered 2016-06-05: 40 mg via INTRAVENOUS
  Administered 2016-06-05: 20 mg via INTRAVENOUS
  Filled 2016-06-05 (×2): qty 4

## 2016-06-05 MED ORDER — WARFARIN SODIUM 3 MG PO TABS
6.0000 mg | ORAL_TABLET | Freq: Once | ORAL | Status: AC
  Administered 2016-06-05: 6 mg via ORAL
  Filled 2016-06-05: qty 2

## 2016-06-05 NOTE — Progress Notes (Signed)
ANTICOAGULATION CONSULT NOTE - Follow Up Consult  Pharmacy Consult for Heparin (while INR <2) Indication: Mechanical AVR  No Known Allergies  Patient Measurements: Height: 5\' 2"  (157.5 cm) Weight: 162 lb 3.2 oz (73.6 kg) IBW/kg (Calculated) : 50.1  Vital Signs: Temp: 98.1 F (36.7 C) (11/24 0439) Temp Source: Oral (11/24 0439) BP: 109/39 (11/24 0439) Pulse Rate: 79 (11/24 0439)  Labs:  Recent Labs  06/03/16 0439  06/04/16 0519 06/04/16 1140 06/04/16 2042 06/05/16 0522  HGB 11.8*  --  11.5*  --   --  11.4*  HCT 36.6  --  36.3  --   --  35.4*  PLT 461*  --  453*  --   --  417*  LABPROT 14.4  --  17.0*  --   --  20.6*  INR 1.12  --  1.37  --   --  1.74  HEPARINUNFRC 0.17*  < > 0.28* 0.93* 1.12* 0.67  CREATININE 1.13*  --  1.17*  --   --   --   < > = values in this interval not displayed.  Estimated Creatinine Clearance: 48.6 mL/min (by C-G formula based on SCr of 1.17 mg/dL (H)).    Assessment: 60 y/o F on heparin bridge while INR is low, heparin level is therapeutic this AM after rate decrease  Goal of Therapy:  Heparin level 0.3-0.7 units/ml Monitor platelets by anticoagulation protocol: Yes   Plan:  -Cont heparin at 1250 units/hr  -1200 HL  [name] 06/05/2016,6:22 AM

## 2016-06-05 NOTE — Progress Notes (Signed)
[place] for heparin / warfarin  Indication: Mechanical AVR  No Known Allergies  Patient Measurements: Height: 5\' 2"  (157.5 cm) Weight: 162 lb 3.2 oz (73.6 kg) IBW/kg (Calculated) : 50.1 Heparin Dosing Weight: 67 kg  Vital Signs: Temp: 98.1 F (36.7 C) (11/24 0439) Temp Source: Oral (11/24 0439) BP: 109/39 (11/24 0439) Pulse Rate: 79 (11/24 0439)  Labs:  Recent Labs  06/03/16 0439  06/04/16 0519 06/04/16 1140 06/04/16 2042 06/05/16 0522  HGB 11.8*  --  11.5*  --   --  11.4*  HCT 36.6  --  36.3  --   --  35.4*  PLT 461*  --  453*  --   --  417*  LABPROT 14.4  --  17.0*  --   --  20.6*  INR 1.12  --  1.37  --   --  1.74  HEPARINUNFRC 0.17*  < > 0.28* 0.93* 1.12* 0.67  CREATININE 1.13*  --  1.17*  --   --   --   < > = values in this interval not displayed.  Estimated Creatinine Clearance: 48.6 mL/min (by C-G formula based on SCr of 1.17 mg/dL (H)).   Medical History: Past Medical History:  Diagnosis Date  . Aortic stenosis    Aortic valve replacement 2006  . CAD (coronary artery disease)    a. s/p CABG 2006 at time of AVR.  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 Dyslipidemia   . Gallstones   . GERD (gastroesophageal reflux disease)   . Heart murmur   . Hx of CABG    2006,LIMA to LAD, SVG to diagonal  . Hx of transfusion of packed red blood cells   . Hypertension   . Hypothyroidism   . Iron deficiency anemia   . Lower GI bleed 06/2015  . Pericardial effusion    Insignificant small pericardial effusion seen on echo, November, 2013  . Peripheral vascular disease ([place]) 06   blood clot -stroke  . S/P aortic valve replacement    a. Bentall procedure,#21 [employer] mechanical valve conduit with reimplantation of the coronaries 2006  . Status post colonoscopy with polypectomy    "bleeding; colonoscopy was ~ 06/18/2015"  . Stroke [facility]) 2006   no deficits   . Warfarin anticoagulation    coumadin therapy     Assessment: 60 yo female with mechanical AVR, she was bridged perioperatively with Lovenox as an outpatient but developed multiple abdominal wall/rectus sheath hematomas and had a 5 g drop in her hemoglobin. She has received multiple units of pRBC and FFP throughout her admission. All anticoagulation was resumed 11/18, now overcoming vitamin K resistance.  Heparin level 0.79 after rate decrease to 1250 units/hr.  No IV line or bleeding issues noted per RN. Will decrease rate again. Hopefully can d/c in am.  INR 1.74 this morning and trended up nicely after 2 doses of 10mg . Will decrease warfarin dose tonight to 7.5mg  and plan to transition off of heparin and back to home dose of warfarin in the morning if INR is greater than 2.   PTA warfarin dose is 3mg  every day except 4.5mg  on Friday  Goal of Therapy:  Heparin level 0.3-0.7 units/ml Monitor platelets by anticoagulation protocol: Yes INR 2-3  Plan:  Decrease heparin to 1050 units/hr Warfarin 7.5mg  tonight Daily INR/heparin level/CBC Monitor for s/sx bleeding  Erin Hearing PharmD., BCPS Clinical Pharmacist Pager [phone] 06/05/2016 11:43 AM

## 2016-06-05 NOTE — Progress Notes (Signed)
ANTICOAGULATION CONSULT NOTE - Follow Up Consult  Pharmacy Consult for Heparin Indication: Mechanical AVR  No Known Allergies  Patient Measurements: Height: 5\' 2"  (157.5 cm) Weight: 162 lb 3.2 oz (73.6 kg) IBW/kg (Calculated) : 50.1 Heparin Dosing Weight: 67kg  Vital Signs: Temp: 97.6 F (36.4 C) (11/24 1243) Temp Source: Oral (11/24 1243) BP: 120/68 (11/24 1243) Pulse Rate: 82 (11/24 1243)  Labs:  Recent Labs  06/03/16 0439  06/04/16 0519  06/05/16 0522 06/05/16 1143 06/05/16 2029  HGB 11.8*  --  11.5*  --  11.4*  --   --   HCT 36.6  --  36.3  --  35.4*  --   --   PLT 461*  --  453*  --  417*  --   --   LABPROT 14.4  --  17.0*  --  20.6*  --   --   INR 1.12  --  1.37  --  1.74  --   --   HEPARINUNFRC 0.17*  < > 0.28*  < > 0.67 0.79* 0.48  CREATININE 1.13*  --  1.17*  --   --   --   --   < > = values in this interval not displayed.  Estimated Creatinine Clearance: 48.6 mL/min (by C-G formula based on SCr of 1.17 mg/dL (H)).   Medications:  Heparin @ 1050 units/hr  Assessment: 60yof continues on heparin for her mechanical AVR in setting of subtherapeutic INR. Heparin level is now therapeutic at 0.48 after rate decrease earlier today. No bleeding reported.  Goal of Therapy:  Heparin level 0.3-0.7 units/ml Monitor platelets by anticoagulation protocol: Yes   Plan:  1) Continue heparin at 1050 units/hr 2) Daily heparin level and CBC  Deboraha Sprang 06/05/2016,9:19 PM

## 2016-06-05 NOTE — Progress Notes (Signed)
PROGRESS NOTE  Elizabeth Morton [id number] DOB: [birth year] DOA: 05/24/2016 PCP: [name], MD  Brief History: 60 yo woman with severe AS and CAD (s/p CABG x2 : LIMA to LAD; SVG to diagonal, and AVR (mechanical AVR as part of Bentall procedure in 2006), HTN, HLD, CVA (due to thrombosed aortic aneurysm), GERD, Fe defic anemia, lower GI bleed presented with rectus sheath hematoma during enoxaparin-warfarin "bridging" for mechanical AVR following ventral hernia repair, complicated by moderate acute blood loss anemia, mild acute renal insufficiency and acute exacerbation of chronic diastolic heart failure following volume resuscitation. She underwent a lap-assisted ventral hernia repair by Dr. Hassell Done on 11/3. The surgery and her post-op course were uncomplicated. She was discharged on 11/5 on a lovenox bridge and coumadin, which she takes for her mechanical aortic valve. She has had evolving abdominal wall bruising since surgery, but on 11/12/17when she looked in the mirror felt her abdomen to be more swollen, and has had worsening abdominal wall and back pain, particularly on the left abdominal wall. She has nausea and generally felt ill, so presented to the ER for evaluation. She was noted to have a 5-point drop in her hemoglobin since it was last checked 5 days prior to admisstion.  CT 05/28/16 showed enlarging hematoma and anticoagulation was reversed with FFP and vit K.  Assessment/Plan: Acute on chronic diastolic CHF -Continue furosemide under direction of cardiology -Admission weight 173 pounds -06/05/2016 weight 162 pounds -question I/O accuracy  Mechanical AVR: -INR 1.12. With her type of valve, risk of thrombosis is not extremely high. On IV heparin without bolus and warfarin until INR>2.0  Rectus sheath hematoma:  -Hgb 11 and stable after last transfusion.  Acute Blood Loss Anemia -baseline Hgb ~11 -Hgb nadir 7.0 -transfused 5 units PRBC for the  admission  AKI -Secondary to hemodynamic changes -Resolved  CAD s/p CABG:  -no angina. Had chronic widespread ST changes, no different from before. Minimal increase in troponin is not unexpected and can be attributed to demand ischemia, not true acute coronary syndrome.  Acute respiratory failure with hypoxia -Likely secondary to CHF -improved with diuresis -Resolved -Presently stable on room air  Hypothyroidism -Continue levothyroxine   Disposition Plan: Home when INR >2 Family Communication: Husband updated at bedside 11/23  Consultants: Cardiology, general surgery, CCM  Code Status: FULL  DVT Prophylaxis: IVHeparin/Coumadin   Procedures: As Listed in Progress Note Above  Antibiotics: None   Subjective: Patient denies fevers, chills, headache, chest pain, dyspnea, nausea, vomiting, diarrhea, abdominal pain, dysuria, hematuria, hematochezia, and melena.   Objective: Vitals:   06/04/16 1221 06/04/16 2108 06/05/16 0439 06/05/16 1243  BP: (!) 107/40 118/73 (!) 109/39 120/68  Pulse: 73 80 79 82  Resp: 18 18 18 18   Temp: 97.8 F (36.6 C) 97.7 F (36.5 C) 98.1 F (36.7 C) 97.6 F (36.4 C)  TempSrc: Oral Oral Oral Oral  SpO2: 100% 98% 97% 98%  Weight:   73.6 kg (162 lb 3.2 oz)   Height:        Intake/Output Summary (Last 24 hours) at 06/05/16 1727 Last data filed at 06/05/16 1600  Gross per 24 hour  Intake           978.28 ml  Output             1975 ml  Net          -996.72 ml   Weight change: -0.816 kg (-1 lb 12.8  oz) Exam:   General:  Pt is alert, follows commands appropriately, not in acute distress  HEENT: No icterus, No thrush, No neck mass, [place]/AT  Cardiovascular: RRR, S1/S2, no rubs, no gallops  Respiratory: bibasilar crackles, no wheeze  Abdomen: Soft/+BS, non tender, non distended, no guarding  Extremities:  1 +LE edema, No lymphangitis, No petechiae, No rashes, no synovitis   Data Reviewed: I have personally  reviewed following labs and imaging studies Basic Metabolic Panel:  Recent Labs Lab 05/31/16 0337 06/01/16 1202 06/02/16 0546 06/03/16 0439 06/04/16 0519  NA 133* 135 134* 136 137  K 3.3* 4.4 3.9 4.0 3.8  CL 99* 100* 103 102 104  CO2 25 25 22 22 24   GLUCOSE 116* 74 91 90 99  BUN 15 14 16 13 15   CREATININE 1.00 1.00 1.05* 1.13* 1.17*  CALCIUM 8.5* 9.2 9.0 9.3 9.5   Liver Function Tests: No results for input(s): AST, ALT, ALKPHOS, BILITOT, PROT, ALBUMIN in the last 168 hours. No results for input(s): LIPASE, AMYLASE in the last 168 hours. No results for input(s): AMMONIA in the last 168 hours. Coagulation Profile:  Recent Labs Lab 06/01/16 0444 06/02/16 0546 06/03/16 0439 06/04/16 0519 06/05/16 0522  INR 1.07 1.07 1.12 1.37 1.74   CBC:  Recent Labs Lab 06/01/16 0444 06/02/16 0546 06/03/16 0439 06/04/16 0519 06/05/16 0522  WBC 7.6 8.0 7.8 7.3 5.6  HGB 11.5* 11.2* 11.8* 11.5* 11.4*  HCT 35.4* 34.5* 36.6 36.3 35.4*  MCV 89.4 89.6 89.5 89.9 90.5  PLT 386 434* 461* 453* 417*   Cardiac Enzymes: No results for input(s): CKTOTAL, CKMB, CKMBINDEX, TROPONINI in the last 168 hours. BNP: Invalid input(s): POCBNP CBG: No results for input(s): GLUCAP in the last 168 hours. HbA1C: No results for input(s): HGBA1C in the last 72 hours. Urine analysis: No results found for: COLORURINE, APPEARANCEUR, LABSPEC, PHURINE, GLUCOSEU, HGBUR, BILIRUBINUR, KETONESUR, PROTEINUR, UROBILINOGEN, NITRITE, LEUKOCYTESUR Sepsis Labs: @LABRCNTIP (procalcitonin:4,lacticidven:4) )No results found for this or any previous visit (from the past 240 hour(s)).   Scheduled Meds: . docusate sodium  100 mg Oral BID  . furosemide  40 mg Intravenous BID  . levothyroxine  50 mcg Oral QAC breakfast  . pantoprazole  40 mg Oral Daily  . polyethylene glycol  17 g Oral Daily  . potassium chloride  20 mEq Oral BID  . warfarin  6 mg Oral ONCE-1800  . Warfarin - Pharmacist Dosing Inpatient   Does not apply  q1800   Continuous Infusions: . heparin 1,050 Units/hr (06/05/16 1600)    Procedures/Studies: Ct Abdomen Pelvis Wo Contrast  Result Date: 05/28/2016 CLINICAL DATA:  60 year old female inpatient status post umbilical hernia repair [id number], complicated by large ventral abdominopelvic wall hematoma, presenting for follow-up. EXAM: CT ABDOMEN AND PELVIS WITHOUT CONTRAST TECHNIQUE: Multidetector CT imaging of the abdomen and pelvis was performed following the standard protocol without IV contrast. COMPARISON:  05/24/2016 CT abdomen/pelvis. FINDINGS: Lower chest: Small layering right greater than left bilateral pleural effusions, new bilaterally. Mild compressive atelectasis in the dependent lower lobes bilaterally. Patchy mild ground-glass attenuation in the lower parahilar lungs bilaterally appears new. Stable top-normal heart size. Partially visualized aortic valve prosthesis is in place. Visualized lower sternotomy wires appear intact. Hepatobiliary: Normal liver with no liver mass. Cholecystectomy. No biliary ductal dilatation. Pancreas: Normal, with no mass or duct dilation. Spleen: Normal size. No mass. Adrenals/Urinary Tract: Normal adrenals. No right renal stones. Punctate nonobstructing 2 mm upper left renal stone. No hydronephrosis. No contour deforming renal mass. Normal caliber ureters.  Bladder is completely collapsed by indwelling Foley catheter. No bladder stones or bladder wall thickening. Stomach/Bowel: Stable small to moderate hiatal hernia. Otherwise grossly normal stomach. Normal caliber small bowel with no small bowel wall thickening. Normal appendix. Normal large bowel with no diverticulosis, large bowel wall thickening or pericolonic fat stranding. Vascular/Lymphatic: Atherosclerotic nonaneurysmal abdominal aorta. No pathologically enlarged lymph nodes in the abdomen or pelvis. Reproductive: Grossly normal uterus.  No adnexal mass. Other: No pneumoperitoneum.  No ascites. Large left  rectus sheath hematoma measures 9.7 x 4.0 x 14.7 cm, previously 8.6 x 5.0 x 14.7 cm on 05/24/2016 using similar measurement technique, not definitely changed, although with increased layering hyperdense blood products. Large left anterior pelvic extraperitoneal hematoma measures 13.8 x 10.1 x 8.9 cm (series 2/image 69), previously 11.0 x 8.4 x 8.3 cm using similar measurement technique, increased, with increased internal layering hyperdense blood products. New left posterior paranephric space retroperitoneal 4.3 x 4.3 x 6.5 cm hematoma extending superiorly from the extraperitoneal left anterior pelvic hematoma (series 2/image 49). Midline extraperitoneal ventral abdominal 8.3 x 7.0 x 8.0 cm hematoma with layering hyperdense blood products (series 2/image 43), previously 8.2 x 7.0 x 7.9 cm, not appreciably changed. Deep subcutaneous hematoma in the left supraumbilical ventral abdominal wall with internal gas measures 8.2 x 2.6 x 6.0 cm (series 2/image 44), previously 8.2 x 2.8 by 6.2 cm using similar measurement technique, not appreciably changed. Musculoskeletal: No aggressive appearing focal osseous lesions. Mild thoracolumbar spondylosis . IMPRESSION: 1. Interval growth of large left anterior pelvic extraperitoneal hematoma as described. New left retroperitoneal hematoma extending superiorly from the left anterior pelvic extraperitoneal hematoma. Findings suggest ongoing extraperitoneal bleeding in the left anterior pelvis/ left retroperitoneum. 2. Large left rectus muscle sheath, extraperitoneal ventral midline abdominal and deep subcutaneous ventral abdominal wall hematomas appear stable. 3. New small bilateral pleural effusions, right greater than left. No ascites. 4. New patchy mild ground-glass attenuation in the lower parahilar lungs bilaterally, which could represent mild pulmonary edema or pneumonitis such as from aspiration. 5. Additional findings include aortic atherosclerosis, punctate nonobstructing  left renal stone and small to moderate hiatal hernia. These results were called by telephone at the time of interpretation on 05/28/2016 at 12:11 pm to DR. DAN FEINSTEIN, who verbally acknowledged these results. Electronically Signed   By: Ilona Sorrel M.D.   On: 05/28/2016 12:16   Ct Abdomen Pelvis W Contrast  Result Date: 05/24/2016 CLINICAL DATA:  Left-sided abdominal pain with bruising and nausea since yesterday. Patient had umbilical hernia surgery on 05/15/2016. EXAM: CT ABDOMEN AND PELVIS WITH CONTRAST TECHNIQUE: Multidetector CT imaging of the abdomen and pelvis was performed using the standard protocol following bolus administration of intravenous contrast. CONTRAST:  40mL ISOVUE-300 IOPAMIDOL (ISOVUE-300) INJECTION 61% COMPARISON:  06/28/2015 FINDINGS: Lower chest: Subsegmental atelectasis both lung bases. The heart is enlarged. Patient is status post CABG. Hepatobiliary: No focal abnormality within the liver parenchyma. Gallbladder surgically absent. Stable mild prominence extrahepatic bile ducts. Pancreas: No focal mass lesion. No dilatation of the main duct. No intraparenchymal cyst. No peripancreatic edema. Spleen: No splenomegaly. No focal mass lesion. Adrenals/Urinary Tract: No adrenal nodule or mass. Kidneys are unremarkable. No evidence for hydroureter. The urinary bladder appears normal for the degree of distention. Stomach/Bowel: Moderate hiatal hernia noted. Stomach otherwise unremarkable. Duodenum is normally positioned as is the ligament of Treitz. No small bowel wall thickening. No small bowel dilatation. The terminal ileum is normal. The appendix is normal. No gross colonic mass. No colonic wall thickening. No  substantial diverticular change. Vascular/Lymphatic: There is abdominal aortic atherosclerosis without aneurysm. There is no gastrohepatic or hepatoduodenal ligament lymphadenopathy. No intraperitoneal or retroperitoneal lymphadenopathy. No pelvic sidewall lymphadenopathy.  Reproductive: The uterus has normal CT imaging appearance. There is no adnexal mass. Other: Small volume free fluid seen around the liver, in both para colic gutters, and in the anatomic pelvis. Fluid around the liver averages 34 Hounsfield units, consistent with blood. Musculoskeletal: Complex multiloculated anterior abdominal wall hematoma is identified in the abdomen and pelvis. - 3 x 6 x 8 cm collection of blood, fluid, and air is identified in the subcutaneous fat just to the left of the umbilicus. - 7 x 8 x 9 cm hematoma is identified in the midline abdomen. This is probably extraperitoneal given the focal nature of the hematoma, but loculated within the omentum cannot be entirely excluded. This hematoma appears to communicate with the hematoma described immediately below. - 6 x 9 x 11 cm hematoma is identified in the left rectus sheath and this appears to communicate with the hematoma listed below. - 8 x 10 x 9 cm hematoma in the space of Retzius Multiple areas of gas and fluid are seen in the subcutaneous fat of the anterior abdominal wall. Many of these were seen on the preoperative study from 06/28/2015 and may represent injection sites. Bone windows reveal no worrisome lytic or sclerotic osseous lesions. IMPRESSION: 1. Multiloculated large hematoma of the anterior abdominal wall extends from the umbilicus down to the symphysis pubis. A Component contained in the left rectus sheath extends down into the space of Retzius. There appears to be communication between the rectus sheath hematoma and a midline hematoma which is probably extraperitoneal anteriorly between the transversalis fascia and the peritoneal lining given the focal appearance although containment within the omentum/gastrocolic ligament is possible but considered less likely. There is also a superficial component of the hematoma seen anterior to the rectus fascia. 2. Small volume hemoperitoneum. 3. Moderate hiatal hernia. Electronically Signed    By: Misty Stanley M.D.   On: 05/24/2016 14:22   Dg Chest Port 1 View  Result Date: 05/28/2016 CLINICAL DATA:  Acute respiratory failure EXAM: PORTABLE CHEST 1 VIEW COMPARISON:  05/27/2016 FINDINGS: 0436 hours. Low volume film. The cardio pericardial silhouette is enlarged. Pulmonary vascular congestion with interstitial and patchy basilar a (right greater than left) airspace disease persists without substantial change. Small bilateral pleural effusions noted. The visualized bony structures of the thorax are intact. Telemetry leads overlie the chest. IMPRESSION: Cardiomegaly with vascular congestion and patchy bibasilar airspace disease shows no substantial change. Small bilateral pleural effusions. Electronically Signed   By: Misty Stanley M.D.   On: 05/28/2016 08:38   Dg Chest Port 1 View  Result Date: 05/27/2016 CLINICAL DATA:  Shortness of breath/respiratory failure EXAM: PORTABLE CHEST 1 VIEW COMPARISON:  May 26, 2016 FINDINGS: Central catheter tip is in the superior cava. No pneumothorax. There remains patchy airspace disease throughout the right mid lower lung zones. There is new consolidation in the medial left base. There is cardiomegaly with pulmonary vascularity within normal limits. Patient is status post aortic valve replacement. There is atherosclerotic calcification in the aorta. No adenopathy. No bone lesions. IMPRESSION: New consolidation medial left base. Patchy airspace opacity in the right middle lung zone regions remains. These areas are felt most likely represent multifocal pneumonia. Stable cardiac silhouette. There is aortic atherosclerosis. No evident pneumothorax. Electronically Signed   By: Lowella Grip III M.D.   On: 05/27/2016 08:06  Dg Chest Port 1 View  Result Date: 05/26/2016 CLINICAL DATA:  Status post central line placement ; history of CABG, aortic valve replacement EXAM: PORTABLE CHEST 1 VIEW COMPARISON:  Portable chest x-ray of May 26, 2016 at 9:56  a.m. FINDINGS: There has been interval placement of right internal jugular venous catheter. The tip overlies the midportion of the SVC. There is no postprocedure pneumothorax or hemo thorax. The lungs are mildly hypoinflated. The interstitial markings remain increased diffusely with areas of confluence. The cardiac silhouette remains enlarged. IMPRESSION: No postprocedure complication following right internal jugular venous catheter placement. Electronically Signed   By: Aolanis Crispen  [place] M.D.   On: 05/26/2016 14:04   Dg Chest Port 1 View  Result Date: 05/26/2016 CLINICAL DATA:  Shortness of breath, chest pain. History of coronary artery disease, aortic valve replacement for aortic stenosis, previous CVA, former smoker. EXAM: PORTABLE CHEST 1 VIEW COMPARISON:  PA and lateral chest x-ray of July 15, 2012 FINDINGS: The lungs are hypoinflated. The interstitial markings are diffusely increased. The pulmonary vascularity is engorged. The cardiac silhouette is enlarged. There is partial obscuration of the left hemidiaphragm. The sternal wires are intact. The prosthetic aortic valve ring is in stable position. There is calcification in the wall of the thoracic aortic arch. The bony thorax is unremarkable. IMPRESSION: CHF with pulmonary interstitial and mild alveolar edema. No definite pneumonia. Thoracic aortic atherosclerosis. Electronically Signed   By: Anaya Bovee  [place] M.D.   On: 05/26/2016 10:18   Dg Abd Portable 1v  Result Date: 05/26/2016 CLINICAL DATA:  Abdominal distention EXAM: PORTABLE ABDOMEN - 1 VIEW COMPARISON:  CT from 2 days ago FINDINGS: Hazy density in the central abdomen with paucity of bowel loops correlating with large abdominal wall and extraperitoneal hematoma. No evidence of bowel obstruction. Cholecystectomy clips. IMPRESSION: Mass effect from known abdominal wall and extraperitoneal hematoma. Normal bowel gas pattern. Electronically Signed   By: Monte Fantasia M.D.   On: 05/26/2016 07:47     Cherylanne Ardelean, DO  [facility] Pager [phone]  If 7PM-7AM, please contact night-coverage [email or web address] Password [email or web address] 06/05/2016, 5:27 PM   LOS: 12 days

## 2016-06-05 NOTE — Progress Notes (Signed)
Spoke to Dr. Liane Comber that the patient already took 40 mg PO of lasix this am. As for the new order of IV lasix 40 mg now, she said to give only 20mg  IV.

## 2016-06-05 NOTE — Progress Notes (Signed)
Patient Name: Elizabeth Morton Date of Encounter: 06/05/2016  Primary Cardiologist: [facility] Problem List     Principal Problem:   Acute on chronic diastolic congestive heart failure [facility]) Active Problems:   Warfarin anticoagulation   S/P aortic valve replacement   Acute blood loss anemia   S/P repair of ventral hernia Nov 2017   Rectus sheath hematoma, initial encounter   Hypotension   Acute respiratory failure (HCC)   Abdominal distension   Bleeding   Pulmonary edema   Rectus sheath hematoma, subsequent encounter   Subjective   Feeling better, no CP or SOB, some lower back pain. Mild LE edema.  Inpatient Medications    Scheduled Meds: . docusate sodium  100 mg Oral BID  . furosemide  40 mg Oral BID  . levothyroxine  50 mcg Oral QAC breakfast  . pantoprazole  40 mg Oral Daily  . polyethylene glycol  17 g Oral Daily  . potassium chloride  20 mEq Oral BID  . Warfarin - Pharmacist Dosing Inpatient   Does not apply q1800   Continuous Infusions: . heparin 1,250 Units/hr (06/05/16 0700)   PRN Meds: acetaminophen, HYDROmorphone (DILAUDID) injection, ipratropium-albuterol, ondansetron **OR** ondansetron (ZOFRAN) IV, sodium chloride flush, sodium chloride flush   Vital Signs    Vitals:   06/04/16 0505 06/04/16 1221 06/04/16 2108 06/05/16 0439  BP: (!) 114/58 (!) 107/40 118/73 (!) 109/39  Pulse: 74 73 80 79  Resp: 19 18 18 18   Temp: 97.3 F (36.3 C) 97.8 F (36.6 C) 97.7 F (36.5 C) 98.1 F (36.7 C)  TempSrc: Oral Oral Oral Oral  SpO2: 99% 100% 98% 97%  Weight: 164 lb (74.4 kg)   162 lb 3.2 oz (73.6 kg)  Height:        Intake/Output Summary (Last 24 hours) at 06/05/16 1025 Last data filed at 06/05/16 0805  Gross per 24 hour  Intake           863.33 ml  Output             1475 ml  Net          -611.67 ml   Filed Weights   06/03/16 0631 06/04/16 0505 06/05/16 0439  Weight: 165 lb 8 oz (75.1 kg) 164 lb (74.4 kg) 162 lb 3.2 oz (73.6 kg)     Physical Exam   Comfortable, smiling GEN: Well nourished, well developed, in no acute distress.  HEENT: Grossly normal.  Neck: Supple, no JVD, carotid bruits, or masses. Cardiac: RRR, crisp prosthetic valve clicks, 1/6 Ao ejection murmur, no diastolic murmurs, rubs, or gallops. No clubbing, cyanosis, mild LE edema.  Radials/DP/PT 2+ and equal bilaterally.  Respiratory:  Respirations regular and unlabored, clear to auscultation bilaterally. GI: Soft, tender in L flank, nondistended, BS + x 4. MS: no deformity or atrophy. Skin: warm and dry, no rash. Neuro:  Strength and sensation are intact. Psych: AAOx3.  Normal affect.  Labs    CBC  Recent Labs  06/04/16 0519 06/05/16 0522  WBC 7.3 5.6  HGB 11.5* 11.4*  HCT 36.3 35.4*  MCV 89.9 90.5  PLT 453* [id number]*   Basic Metabolic Panel  Recent Labs  06/03/16 0439 06/04/16 0519  NA 136 137  K 4.0 3.8  CL 102 104  CO2 22 24  GLUCOSE 90 99  BUN 13 15  CREATININE 1.13* 1.17*  CALCIUM 9.3 9.5     Telemetry    NSR - Personally Reviewed  ECHO  TTE: 05/29/16  Study Conclusions  - Procedure narrative: Transthoracic echocardiography. Image   quality was adequate. The study was technically difficult. - Left ventricle: The cavity size was normal. Wall thickness was   increased in a pattern of mild LVH. Systolic function was normal.   The estimated ejection fraction was in the range of 55% to 60%.   Features are consistent with a pseudonormal left ventricular   filling pattern, with concomitant abnormal relaxation and   increased filling pressure (grade 2 diastolic dysfunction). - Aortic valve: A bioprosthesis was present. There was mild   regurgitation. - Mitral valve: There was mild regurgitation. - Pulmonary arteries: Systolic pressure was mildly increased. PA   peak pressure: 38 mm Hg (S).  Radiology    No results found.   Patient Profile     60 yo woman with rectus sheath hematoma during  enoxaparin-warfarin "bridging" for mechanical AVR following ventral hernia repair, complicated by moderate acute blood loss anemia, mild acute renal insufficiency and acute exacerbation of chronic diastolic heart failure following volume resuscitation. Background problems include CAD s/p CABG and moderately elevated AV prosthesis gradients. CT 05/28/16 showed enlarging hematoma and anticoagulation was reversed with FFP and vit K.   Assessment & Plan    1. Acute on chronic diastolic CHF: she appears mildly fluid overloaded , weight is down 171>>162 lbs. Daily weights and strict in/out.  I will give lasix iv 40 mg BID x 2 doses and switch to PO tomorrow.  2. Mechanical AVR: INR 1.7. With her type of valve, risk of thrombosis is not extremely high. On IV heparin without bolus and warfarin until INR>2.0. I have called the pharmacy as her INR is not moving, they will review coumadin dose.  3. Rectus sheath hematoma: Hgb 11 and stable after last transfusion.  4. ARF: resolved.   5.CAD s/p CABG: no angina. Had chronic widespread ST changes, no different from before. Minimal increase in troponin is not unexpected and can be attributed to demand ischemia, not true acute coronary syndrome.  6. Hypokalemia: Resolved  Able to be discharged from a cardiology standpoint when INR >2. Today still 1.7.  Ena Dawley, MD 06/05/2016, 10:25 AM

## 2016-06-05 NOTE — Progress Notes (Signed)
Patient refused bed alarm. Will continue to monitor patirnt.

## 2016-06-06 LAB — PROTIME-INR
INR: 1.96
Prothrombin Time: 22.6 seconds — ABNORMAL HIGH (ref 11.4–15.2)

## 2016-06-06 LAB — BASIC METABOLIC PANEL
Anion gap: 10 (ref 5–15)
BUN: 17 mg/dL (ref 6–20)
CHLORIDE: 101 mmol/L (ref 101–111)
CO2: 25 mmol/L (ref 22–32)
CREATININE: 1.33 mg/dL — AB (ref 0.44–1.00)
Calcium: 9.8 mg/dL (ref 8.9–10.3)
GFR calc Af Amer: 50 mL/min — ABNORMAL LOW (ref >60)
GFR calc non Af Amer: 43 mL/min — ABNORMAL LOW (ref >60)
Glucose, Bld: 95 mg/dL (ref 65–99)
Potassium: 4.1 mmol/L (ref 3.5–5.1)
Sodium: 136 mmol/L (ref 135–145)

## 2016-06-06 LAB — CBC
HCT: 38 % (ref 36.0–46.0)
Hemoglobin: 12.4 g/dL (ref 12.0–15.0)
MCH: 29.5 pg (ref 26.0–34.0)
MCHC: 32.6 g/dL (ref 30.0–36.0)
MCV: 90.3 fL (ref 78.0–100.0)
Platelets: 439 10*3/uL — ABNORMAL HIGH (ref 150–400)
RBC: 4.21 MIL/uL (ref 3.87–5.11)
RDW: 15.7 % — ABNORMAL HIGH (ref 11.5–15.5)
WBC: 6.1 10*3/uL (ref 4.0–10.5)

## 2016-06-06 LAB — HEPARIN LEVEL (UNFRACTIONATED): Heparin Unfractionated: 0.44 IU/mL (ref 0.30–0.70)

## 2016-06-06 LAB — MAGNESIUM: Magnesium: 2.1 mg/dL (ref 1.7–2.4)

## 2016-06-06 MED ORDER — POTASSIUM CHLORIDE CRYS ER 20 MEQ PO TBCR: 20.0000 meq | EXTENDED_RELEASE_TABLET | Freq: Every day | ORAL | Status: DC

## 2016-06-06 MED ORDER — FUROSEMIDE 40 MG PO TABS
40.0000 mg | ORAL_TABLET | Freq: Every day | ORAL | Status: DC
  Administered 2016-06-07: 40 mg via ORAL
  Filled 2016-06-06: qty 1

## 2016-06-06 MED ORDER — WARFARIN SODIUM 5 MG PO TABS
5.0000 mg | ORAL_TABLET | Freq: Once | ORAL | Status: AC
  Administered 2016-06-06: 5 mg via ORAL
  Filled 2016-06-06: qty 1

## 2016-06-06 NOTE — Discharge Summary (Addendum)
Physician Discharge Summary  Elizabeth Morton [id number] DOB: 11/11/1955 DOA: 05/24/2016  PCP: [name], MD  Admit date: 05/24/2016 Discharge date: 06/07/16  Admitted From: Home Disposition:  Home  Recommendations for Outpatient Follow-up:  1. Follow up with PCP in 1-2 weeks 2. Please obtain BMP/CBC in one week 3. 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 She underwent a lap-assisted ventral hernia repair by Dr. Hassell Done on 11/3. The surgery and her post-op course were uncomplicated. She was discharged on 11/5 on a lovenox bridge and coumadin, which she takes for her mechanical aortic valve. She has had evolving abdominal wall bruising since surgery, but on 11/12/17when she looked in the mirror felt her abdomen to be more swollen, and has had worsening abdominal wall and back pain, particularly on the left abdominal wall. She has nausea and generally felt ill, so presented to the ER for evaluation. She was noted to have a 5-point drop in her hemoglobin since it was last checked 5 days prior to admisstion. CT 05/28/16 showed enlarging hematoma and anticoagulation was reversed with FFP and vit K.  Discharge Diagnoses:  Acute on chronic diastolic CHF -Continue furosemide under direction of cardiology -Admission weight 173  pounds -discharge weight 161pounds -question I/O accuracy -home with lasix po 40 mg daily  Mechanical AVR: -INR 1.12. With her type of valve, risk of thrombosis is not extremely high. On IV heparin without bolus and warfarin until INR>2.0 -would avoid any future bridging with lovenox -please check INR on 06/10/16 -INR 2.19 on day of dc -PharmD recommended d/c home with previous dose of coumadin  Rectus sheath hematoma:  -Hgb 11 and stable after last transfusion.  Acute Blood Loss Anemia -baseline Hgb ~11 -Hgb nadir 7.0 -transfused 5units PRBC for the admission -Hgb stable since last PRBC transfusion  AKI -Secondary to hemodynamic changes -Resolved -baseline serum creatinine 0.9-1.1 -serum creatinine 1.03 on day of d/c  CAD s/p CABG:  -no angina. Had chronic widespread ST changes, no different from before. Minimal increase in troponin is not unexpected and is attributed to demand ischemia  Acute respiratory failure with hypoxia -Likely secondary to CHF -improved with diuresis -Resolved -Presently stable on room air  Hypothyroidism -Continue levothyroxine   Discharge Instructions  Discharge Instructions    Diet - low sodium heart healthy    Complete by:  As directed    Increase activity slowly    Complete by:  As directed        Medication List    STOP taking these medications   enoxaparin 80 MG/0.8ML injection Commonly known as:  LOVENOX   triazolam 0.25 MG tablet Commonly known as:  HALCION     TAKE these medications   acetaminophen 500 MG tablet Commonly known as:  TYLENOL Take 500-1,000 mg by mouth every 6 (six) hours as needed (for pain).   CALCIUM 600+D 600-400 MG-UNIT tablet Generic drug:  Calcium Carbonate-Vitamin D Take 1 tablet by  mouth 2 (two) times daily.   furosemide 40 MG tablet Commonly known as:  LASIX Take 1 tablet (40 mg total) by mouth daily. Start taking on:  06/08/2016 What changed:  medication strength  how  much to take   levothyroxine 50 MCG tablet Commonly known as:  SYNTHROID, LEVOTHROID Take 50 mcg by mouth daily before breakfast.   omeprazole 20 MG capsule Commonly known as:  PRILOSEC Take 20 mg by mouth daily.   oxyCODONE 5 MG immediate release tablet Commonly known as:  Oxy IR/ROXICODONE Take 1 tablet (5 mg total) by mouth every 4 (four) hours as needed for moderate pain.   PHAZYME MAXIMUM STRENGTH 250 MG Caps Generic drug:  Simethicone Take 250 mg by mouth 2 (two) times daily as needed (for gas/flatulence).   potassium chloride 10 MEQ CR capsule Commonly known as:  MICRO-K TAKE 1 TABLET BY MOUTH EVERY DAY   simvastatin 40 MG tablet Commonly known as:  ZOCOR Take 1 tablet (40 mg total) by mouth at bedtime.   warfarin 3 MG tablet Commonly known as:  COUMADIN Take 1-1.5 tablets (3-4.5 mg total) by mouth See admin instructions. Take 1 1/2 tablets (4.5 mg) by mouth on Friday evening (between 7pm and 7:30pm), take 1 tablet (3 mg) on all other evenings of the week (Sat thru Thurs) What changed:  how much to take  when to take this  additional instructions       No Known Allergies  Consultations:  Cardiology  General surgery  CCM   Procedures/Studies: Ct Abdomen Pelvis Wo Contrast  Result Date: 05/28/2016 CLINICAL DATA:  60 year old female inpatient status post umbilical hernia repair [id number], complicated by large ventral abdominopelvic wall hematoma, presenting for follow-up. EXAM: CT ABDOMEN AND PELVIS WITHOUT CONTRAST TECHNIQUE: Multidetector CT imaging of the abdomen and pelvis was performed following the standard protocol without IV contrast. COMPARISON:  05/24/2016 CT abdomen/pelvis. FINDINGS: Lower chest: Small layering right greater than left bilateral pleural effusions, new bilaterally. Mild compressive atelectasis in the dependent lower lobes bilaterally. Patchy mild ground-glass attenuation in the lower parahilar lungs bilaterally appears new. Stable  top-normal heart size. Partially visualized aortic valve prosthesis is in place. Visualized lower sternotomy wires appear intact. Hepatobiliary: Normal liver with no liver mass. Cholecystectomy. No biliary ductal dilatation. Pancreas: Normal, with no mass or duct dilation. Spleen: Normal size. No mass. Adrenals/Urinary Tract: Normal adrenals. No right renal stones. Punctate nonobstructing 2 mm upper left renal stone. No hydronephrosis. No contour deforming renal mass. Normal caliber ureters. Bladder is completely collapsed by indwelling Foley catheter. No bladder stones or bladder wall thickening. Stomach/Bowel: Stable small to moderate hiatal hernia. Otherwise grossly normal stomach. Normal caliber small bowel with no small bowel wall thickening. Normal appendix. Normal large bowel with no diverticulosis, large bowel wall thickening or pericolonic fat stranding. Vascular/Lymphatic: Atherosclerotic nonaneurysmal abdominal aorta. No pathologically enlarged lymph nodes in the abdomen or pelvis. Reproductive: Grossly normal uterus.  No adnexal mass. Other: No pneumoperitoneum.  No ascites. Large left rectus sheath hematoma measures 9.7 x 4.0 x 14.7 cm, previously 8.6 x 5.0 x 14.7 cm on 05/24/2016 using similar measurement technique, not definitely changed, although with increased layering hyperdense blood products. Large left anterior pelvic extraperitoneal hematoma measures 13.8 x 10.1 x 8.9 cm (series 2/image 69), previously 11.0 x 8.4 x 8.3 cm using similar measurement technique, increased, with increased internal layering hyperdense blood products. New left posterior paranephric space retroperitoneal 4.3 x 4.3 x 6.5 cm hematoma extending superiorly from the extraperitoneal left anterior  pelvic hematoma (series 2/image 49). Midline extraperitoneal ventral abdominal 8.3 x 7.0 x 8.0 cm hematoma with layering hyperdense blood products (series 2/image 43), previously 8.2 x 7.0 x 7.9 cm, not appreciably changed. Deep  subcutaneous hematoma in the left supraumbilical ventral abdominal wall with internal gas measures 8.2 x 2.6 x 6.0 cm (series 2/image 44), previously 8.2 x 2.8 by 6.2 cm using similar measurement technique, not appreciably changed. Musculoskeletal: No aggressive appearing focal osseous lesions. Mild thoracolumbar spondylosis . IMPRESSION: 1. Interval growth of large left anterior pelvic extraperitoneal hematoma as described. New left retroperitoneal hematoma extending superiorly from the left anterior pelvic extraperitoneal hematoma. Findings suggest ongoing extraperitoneal bleeding in the left anterior pelvis/ left retroperitoneum. 2. Large left rectus muscle sheath, extraperitoneal ventral midline abdominal and deep subcutaneous ventral abdominal wall hematomas appear stable. 3. New small bilateral pleural effusions, right greater than left. No ascites. 4. New patchy mild ground-glass attenuation in the lower parahilar lungs bilaterally, which could represent mild pulmonary edema or pneumonitis such as from aspiration. 5. Additional findings include aortic atherosclerosis, punctate nonobstructing left renal stone and small to moderate hiatal hernia. These results were called by telephone at the time of interpretation on 05/28/2016 at 12:11 pm to DR. DAN FEINSTEIN, who verbally acknowledged these results. Electronically Signed   By: Ilona Sorrel M.D.   On: 05/28/2016 12:16   Ct Abdomen Pelvis W Contrast  Result Date: 05/24/2016 CLINICAL DATA:  Left-sided abdominal pain with bruising and nausea since yesterday. Patient had umbilical hernia surgery on 05/15/2016. EXAM: CT ABDOMEN AND PELVIS WITH CONTRAST TECHNIQUE: Multidetector CT imaging of the abdomen and pelvis was performed using the standard protocol following bolus administration of intravenous contrast. CONTRAST:  46mL ISOVUE-300 IOPAMIDOL (ISOVUE-300) INJECTION 61% COMPARISON:  06/28/2015 FINDINGS: Lower chest: Subsegmental atelectasis both lung bases.  The heart is enlarged. Patient is status post CABG. Hepatobiliary: No focal abnormality within the liver parenchyma. Gallbladder surgically absent. Stable mild prominence extrahepatic bile ducts. Pancreas: No focal mass lesion. No dilatation of the main duct. No intraparenchymal cyst. No peripancreatic edema. Spleen: No splenomegaly. No focal mass lesion. Adrenals/Urinary Tract: No adrenal nodule or mass. Kidneys are unremarkable. No evidence for hydroureter. The urinary bladder appears normal for the degree of distention. Stomach/Bowel: Moderate hiatal hernia noted. Stomach otherwise unremarkable. Duodenum is normally positioned as is the ligament of Treitz. No small bowel wall thickening. No small bowel dilatation. The terminal ileum is normal. The appendix is normal. No gross colonic mass. No colonic wall thickening. No substantial diverticular change. Vascular/Lymphatic: There is abdominal aortic atherosclerosis without aneurysm. There is no gastrohepatic or hepatoduodenal ligament lymphadenopathy. No intraperitoneal or retroperitoneal lymphadenopathy. No pelvic sidewall lymphadenopathy. Reproductive: The uterus has normal CT imaging appearance. There is no adnexal mass. Other: Small volume free fluid seen around the liver, in both para colic gutters, and in the anatomic pelvis. Fluid around the liver averages 34 Hounsfield units, consistent with blood. Musculoskeletal: Complex multiloculated anterior abdominal wall hematoma is identified in the abdomen and pelvis. - 3 x 6 x 8 cm collection of blood, fluid, and air is identified in the subcutaneous fat just to the left of the umbilicus. - 7 x 8 x 9 cm hematoma is identified in the midline abdomen. This is probably extraperitoneal given the focal nature of the hematoma, but loculated within the omentum cannot be entirely excluded. This hematoma appears to communicate with the hematoma described immediately below. - 6 x 9 x 11 cm hematoma is identified in the left  rectus  sheath and this appears to communicate with the hematoma listed below. - 8 x 10 x 9 cm hematoma in the space of Retzius Multiple areas of gas and fluid are seen in the subcutaneous fat of the anterior abdominal wall. Many of these were seen on the preoperative study from 06/28/2015 and may represent injection sites. Bone windows reveal no worrisome lytic or sclerotic osseous lesions. IMPRESSION: 1. Multiloculated large hematoma of the anterior abdominal wall extends from the umbilicus down to the symphysis pubis. A Component contained in the left rectus sheath extends down into the space of Retzius. There appears to be communication between the rectus sheath hematoma and a midline hematoma which is probably extraperitoneal anteriorly between the transversalis fascia and the peritoneal lining given the focal appearance although containment within the omentum/gastrocolic ligament is possible but considered less likely. There is also a superficial component of the hematoma seen anterior to the rectus fascia. 2. Small volume hemoperitoneum. 3. Moderate hiatal hernia. Electronically Signed   By: Misty Stanley M.D.   On: 05/24/2016 14:22   Dg Chest Port 1 View  Result Date: 05/28/2016 CLINICAL DATA:  Acute respiratory failure EXAM: PORTABLE CHEST 1 VIEW COMPARISON:  05/27/2016 FINDINGS: 0436 hours. Low volume film. The cardio pericardial silhouette is enlarged. Pulmonary vascular congestion with interstitial and patchy basilar a (right greater than left) airspace disease persists without substantial change. Small bilateral pleural effusions noted. The visualized bony structures of the thorax are intact. Telemetry leads overlie the chest. IMPRESSION: Cardiomegaly with vascular congestion and patchy bibasilar airspace disease shows no substantial change. Small bilateral pleural effusions. Electronically Signed   By: Misty Stanley M.D.   On: 05/28/2016 08:38   Dg Chest Port 1 View  Result Date:  05/27/2016 CLINICAL DATA:  Shortness of breath/respiratory failure EXAM: PORTABLE CHEST 1 VIEW COMPARISON:  May 26, 2016 FINDINGS: Central catheter tip is in the superior cava. No pneumothorax. There remains patchy airspace disease throughout the right mid lower lung zones. There is new consolidation in the medial left base. There is cardiomegaly with pulmonary vascularity within normal limits. Patient is status post aortic valve replacement. There is atherosclerotic calcification in the aorta. No adenopathy. No bone lesions. IMPRESSION: New consolidation medial left base. Patchy airspace opacity in the right middle lung zone regions remains. These areas are felt most likely represent multifocal pneumonia. Stable cardiac silhouette. There is aortic atherosclerosis. No evident pneumothorax. Electronically Signed   By: Lowella Grip III M.D.   On: 05/27/2016 08:06   Dg Chest Port 1 View  Result Date: 05/26/2016 CLINICAL DATA:  Status post central line placement ; history of CABG, aortic valve replacement EXAM: PORTABLE CHEST 1 VIEW COMPARISON:  Portable chest x-ray of May 26, 2016 at 9:56 a.m. FINDINGS: There has been interval placement of right internal jugular venous catheter. The tip overlies the midportion of the SVC. There is no postprocedure pneumothorax or hemo thorax. The lungs are mildly hypoinflated. The interstitial markings remain increased diffusely with areas of confluence. The cardiac silhouette remains enlarged. IMPRESSION: No postprocedure complication following right internal jugular venous catheter placement. Electronically Signed   By: Burgundy Matuszak  [place] M.D.   On: 05/26/2016 14:04   Dg Chest Port 1 View  Result Date: 05/26/2016 CLINICAL DATA:  Shortness of breath, chest pain. History of coronary artery disease, aortic valve replacement for aortic stenosis, previous CVA, former smoker. EXAM: PORTABLE CHEST 1 VIEW COMPARISON:  PA and lateral chest x-ray of July 15, 2012  FINDINGS: The lungs are hypoinflated.  The interstitial markings are diffusely increased. The pulmonary vascularity is engorged. The cardiac silhouette is enlarged. There is partial obscuration of the left hemidiaphragm. The sternal wires are intact. The prosthetic aortic valve ring is in stable position. There is calcification in the wall of the thoracic aortic arch. The bony thorax is unremarkable. IMPRESSION: CHF with pulmonary interstitial and mild alveolar edema. No definite pneumonia. Thoracic aortic atherosclerosis. Electronically Signed   By: Lucresia Simic  [place] M.D.   On: 05/26/2016 10:18   Dg Abd Portable 1v  Result Date: 05/26/2016 CLINICAL DATA:  Abdominal distention EXAM: PORTABLE ABDOMEN - 1 VIEW COMPARISON:  CT from 2 days ago FINDINGS: Hazy density in the central abdomen with paucity of bowel loops correlating with large abdominal wall and extraperitoneal hematoma. No evidence of bowel obstruction. Cholecystectomy clips. IMPRESSION: Mass effect from known abdominal wall and extraperitoneal hematoma. Normal bowel gas pattern. Electronically Signed   By: Monte Fantasia M.D.   On: 05/26/2016 07:47        Discharge Exam: Vitals:   06/07/16 0532 06/07/16 1236  BP: (!) 111/54 (!) 110/50  Pulse: 68 69  Resp: 18 18  Temp: 97.9 F (36.6 C) 97.8 F (36.6 C)   Vitals:   06/06/16 1230 06/06/16 2100 06/07/16 0532 06/07/16 1236  BP: (!) 110/46 (!) 109/50 (!) 111/54 (!) 110/50  Pulse: 70 73 68 69  Resp: 20 18 18 18   Temp: 98.1 F (36.7 C) 98.1 F (36.7 C) 97.9 F (36.6 C) 97.8 F (36.6 C)  TempSrc: Oral Oral Oral Oral  SpO2: 100% 100% 96% 100%  Weight:   73.1 kg (161 lb 1.6 oz)   Height:        General: Pt is alert, awake, not in acute distress Cardiovascular: RRR, S1/S2 +, no rubs, no gallops Respiratory: CTA bilaterally, no wheezing, no rhonchi Abdominal: Soft, NT, ND, bowel sounds + Extremities: no edema, no cyanosis   The results of significant diagnostics from this  hospitalization (including imaging, microbiology, ancillary and laboratory) are listed below for reference.    Significant Diagnostic Studies: Ct Abdomen Pelvis Wo Contrast  Result Date: 05/28/2016 CLINICAL DATA:  60 year old female inpatient status post umbilical hernia repair [id number], complicated by large ventral abdominopelvic wall hematoma, presenting for follow-up. EXAM: CT ABDOMEN AND PELVIS WITHOUT CONTRAST TECHNIQUE: Multidetector CT imaging of the abdomen and pelvis was performed following the standard protocol without IV contrast. COMPARISON:  05/24/2016 CT abdomen/pelvis. FINDINGS: Lower chest: Small layering right greater than left bilateral pleural effusions, new bilaterally. Mild compressive atelectasis in the dependent lower lobes bilaterally. Patchy mild ground-glass attenuation in the lower parahilar lungs bilaterally appears new. Stable top-normal heart size. Partially visualized aortic valve prosthesis is in place. Visualized lower sternotomy wires appear intact. Hepatobiliary: Normal liver with no liver mass. Cholecystectomy. No biliary ductal dilatation. Pancreas: Normal, with no mass or duct dilation. Spleen: Normal size. No mass. Adrenals/Urinary Tract: Normal adrenals. No right renal stones. Punctate nonobstructing 2 mm upper left renal stone. No hydronephrosis. No contour deforming renal mass. Normal caliber ureters. Bladder is completely collapsed by indwelling Foley catheter. No bladder stones or bladder wall thickening. Stomach/Bowel: Stable small to moderate hiatal hernia. Otherwise grossly normal stomach. Normal caliber small bowel with no small bowel wall thickening. Normal appendix. Normal large bowel with no diverticulosis, large bowel wall thickening or pericolonic fat stranding. Vascular/Lymphatic: Atherosclerotic nonaneurysmal abdominal aorta. No pathologically enlarged lymph nodes in the abdomen or pelvis. Reproductive: Grossly normal uterus.  No adnexal mass. Other: No  pneumoperitoneum.  No ascites. Large left rectus sheath hematoma measures 9.7 x 4.0 x 14.7 cm, previously 8.6 x 5.0 x 14.7 cm on 05/24/2016 using similar measurement technique, not definitely changed, although with increased layering hyperdense blood products. Large left anterior pelvic extraperitoneal hematoma measures 13.8 x 10.1 x 8.9 cm (series 2/image 69), previously 11.0 x 8.4 x 8.3 cm using similar measurement technique, increased, with increased internal layering hyperdense blood products. New left posterior paranephric space retroperitoneal 4.3 x 4.3 x 6.5 cm hematoma extending superiorly from the extraperitoneal left anterior pelvic hematoma (series 2/image 49). Midline extraperitoneal ventral abdominal 8.3 x 7.0 x 8.0 cm hematoma with layering hyperdense blood products (series 2/image 43), previously 8.2 x 7.0 x 7.9 cm, not appreciably changed. Deep subcutaneous hematoma in the left supraumbilical ventral abdominal wall with internal gas measures 8.2 x 2.6 x 6.0 cm (series 2/image 44), previously 8.2 x 2.8 by 6.2 cm using similar measurement technique, not appreciably changed. Musculoskeletal: No aggressive appearing focal osseous lesions. Mild thoracolumbar spondylosis . IMPRESSION: 1. Interval growth of large left anterior pelvic extraperitoneal hematoma as described. New left retroperitoneal hematoma extending superiorly from the left anterior pelvic extraperitoneal hematoma. Findings suggest ongoing extraperitoneal bleeding in the left anterior pelvis/ left retroperitoneum. 2. Large left rectus muscle sheath, extraperitoneal ventral midline abdominal and deep subcutaneous ventral abdominal wall hematomas appear stable. 3. New small bilateral pleural effusions, right greater than left. No ascites. 4. New patchy mild ground-glass attenuation in the lower parahilar lungs bilaterally, which could represent mild pulmonary edema or pneumonitis such as from aspiration. 5. Additional findings include aortic  atherosclerosis, punctate nonobstructing left renal stone and small to moderate hiatal hernia. These results were called by telephone at the time of interpretation on 05/28/2016 at 12:11 pm to DR. DAN FEINSTEIN, who verbally acknowledged these results. Electronically Signed   By: Ilona Sorrel M.D.   On: 05/28/2016 12:16   Ct Abdomen Pelvis W Contrast  Result Date: 05/24/2016 CLINICAL DATA:  Left-sided abdominal pain with bruising and nausea since yesterday. Patient had umbilical hernia surgery on 05/15/2016. EXAM: CT ABDOMEN AND PELVIS WITH CONTRAST TECHNIQUE: Multidetector CT imaging of the abdomen and pelvis was performed using the standard protocol following bolus administration of intravenous contrast. CONTRAST:  2mL ISOVUE-300 IOPAMIDOL (ISOVUE-300) INJECTION 61% COMPARISON:  06/28/2015 FINDINGS: Lower chest: Subsegmental atelectasis both lung bases. The heart is enlarged. Patient is status post CABG. Hepatobiliary: No focal abnormality within the liver parenchyma. Gallbladder surgically absent. Stable mild prominence extrahepatic bile ducts. Pancreas: No focal mass lesion. No dilatation of the main duct. No intraparenchymal cyst. No peripancreatic edema. Spleen: No splenomegaly. No focal mass lesion. Adrenals/Urinary Tract: No adrenal nodule or mass. Kidneys are unremarkable. No evidence for hydroureter. The urinary bladder appears normal for the degree of distention. Stomach/Bowel: Moderate hiatal hernia noted. Stomach otherwise unremarkable. Duodenum is normally positioned as is the ligament of Treitz. No small bowel wall thickening. No small bowel dilatation. The terminal ileum is normal. The appendix is normal. No gross colonic mass. No colonic wall thickening. No substantial diverticular change. Vascular/Lymphatic: There is abdominal aortic atherosclerosis without aneurysm. There is no gastrohepatic or hepatoduodenal ligament lymphadenopathy. No intraperitoneal or retroperitoneal lymphadenopathy. No  pelvic sidewall lymphadenopathy. Reproductive: The uterus has normal CT imaging appearance. There is no adnexal mass. Other: Small volume free fluid seen around the liver, in both para colic gutters, and in the anatomic pelvis. Fluid around the liver averages 34 Hounsfield units, consistent with blood. Musculoskeletal: Complex multiloculated anterior abdominal wall hematoma is  identified in the abdomen and pelvis. - 3 x 6 x 8 cm collection of blood, fluid, and air is identified in the subcutaneous fat just to the left of the umbilicus. - 7 x 8 x 9 cm hematoma is identified in the midline abdomen. This is probably extraperitoneal given the focal nature of the hematoma, but loculated within the omentum cannot be entirely excluded. This hematoma appears to communicate with the hematoma described immediately below. - 6 x 9 x 11 cm hematoma is identified in the left rectus sheath and this appears to communicate with the hematoma listed below. - 8 x 10 x 9 cm hematoma in the space of Retzius Multiple areas of gas and fluid are seen in the subcutaneous fat of the anterior abdominal wall. Many of these were seen on the preoperative study from 06/28/2015 and may represent injection sites. Bone windows reveal no worrisome lytic or sclerotic osseous lesions. IMPRESSION: 1. Multiloculated large hematoma of the anterior abdominal wall extends from the umbilicus down to the symphysis pubis. A Component contained in the left rectus sheath extends down into the space of Retzius. There appears to be communication between the rectus sheath hematoma and a midline hematoma which is probably extraperitoneal anteriorly between the transversalis fascia and the peritoneal lining given the focal appearance although containment within the omentum/gastrocolic ligament is possible but considered less likely. There is also a superficial component of the hematoma seen anterior to the rectus fascia. 2. Small volume hemoperitoneum. 3. Moderate  hiatal hernia. Electronically Signed   By: Misty Stanley M.D.   On: 05/24/2016 14:22   Dg Chest Port 1 View  Result Date: 05/28/2016 CLINICAL DATA:  Acute respiratory failure EXAM: PORTABLE CHEST 1 VIEW COMPARISON:  05/27/2016 FINDINGS: 0436 hours. Low volume film. The cardio pericardial silhouette is enlarged. Pulmonary vascular congestion with interstitial and patchy basilar a (right greater than left) airspace disease persists without substantial change. Small bilateral pleural effusions noted. The visualized bony structures of the thorax are intact. Telemetry leads overlie the chest. IMPRESSION: Cardiomegaly with vascular congestion and patchy bibasilar airspace disease shows no substantial change. Small bilateral pleural effusions. Electronically Signed   By: Misty Stanley M.D.   On: 05/28/2016 08:38   Dg Chest Port 1 View  Result Date: 05/27/2016 CLINICAL DATA:  Shortness of breath/respiratory failure EXAM: PORTABLE CHEST 1 VIEW COMPARISON:  May 26, 2016 FINDINGS: Central catheter tip is in the superior cava. No pneumothorax. There remains patchy airspace disease throughout the right mid lower lung zones. There is new consolidation in the medial left base. There is cardiomegaly with pulmonary vascularity within normal limits. Patient is status post aortic valve replacement. There is atherosclerotic calcification in the aorta. No adenopathy. No bone lesions. IMPRESSION: New consolidation medial left base. Patchy airspace opacity in the right middle lung zone regions remains. These areas are felt most likely represent multifocal pneumonia. Stable cardiac silhouette. There is aortic atherosclerosis. No evident pneumothorax. Electronically Signed   By: Lowella Grip III M.D.   On: 05/27/2016 08:06   Dg Chest Port 1 View  Result Date: 05/26/2016 CLINICAL DATA:  Status post central line placement ; history of CABG, aortic valve replacement EXAM: PORTABLE CHEST 1 VIEW COMPARISON:  Portable  chest x-ray of May 26, 2016 at 9:56 a.m. FINDINGS: There has been interval placement of right internal jugular venous catheter. The tip overlies the midportion of the SVC. There is no postprocedure pneumothorax or hemo thorax. The lungs are mildly hypoinflated. The interstitial markings remain  increased diffusely with areas of confluence. The cardiac silhouette remains enlarged. IMPRESSION: No postprocedure complication following right internal jugular venous catheter placement. Electronically Signed   By: Cuahutemoc Attar  [place] M.D.   On: 05/26/2016 14:04   Dg Chest Port 1 View  Result Date: 05/26/2016 CLINICAL DATA:  Shortness of breath, chest pain. History of coronary artery disease, aortic valve replacement for aortic stenosis, previous CVA, former smoker. EXAM: PORTABLE CHEST 1 VIEW COMPARISON:  PA and lateral chest x-ray of July 15, 2012 FINDINGS: The lungs are hypoinflated. The interstitial markings are diffusely increased. The pulmonary vascularity is engorged. The cardiac silhouette is enlarged. There is partial obscuration of the left hemidiaphragm. The sternal wires are intact. The prosthetic aortic valve ring is in stable position. There is calcification in the wall of the thoracic aortic arch. The bony thorax is unremarkable. IMPRESSION: CHF with pulmonary interstitial and mild alveolar edema. No definite pneumonia. Thoracic aortic atherosclerosis. Electronically Signed   By: Woodard Perrell  [place] M.D.   On: 05/26/2016 10:18   Dg Abd Portable 1v  Result Date: 05/26/2016 CLINICAL DATA:  Abdominal distention EXAM: PORTABLE ABDOMEN - 1 VIEW COMPARISON:  CT from 2 days ago FINDINGS: Hazy density in the central abdomen with paucity of bowel loops correlating with large abdominal wall and extraperitoneal hematoma. No evidence of bowel obstruction. Cholecystectomy clips. IMPRESSION: Mass effect from known abdominal wall and extraperitoneal hematoma. Normal bowel gas pattern. Electronically Signed   By:  Monte Fantasia M.D.   On: 05/26/2016 07:47     Microbiology: No results found for this or any previous visit (from the past 240 hour(s)).   Labs: Basic Metabolic Panel:  Recent Labs Lab 06/03/16 0439 06/04/16 0519 06/06/16 0516 06/07/16 0548 06/07/16 1017  NA 136 137 136 138 135  K 4.0 3.8 4.1 5.9* 4.4  CL 102 104 101 106 104  CO2 22 24 25 24  21*  GLUCOSE 90 99 95 95 90  BUN 13 15 17 14 12   CREATININE 1.13* 1.17* 1.33* 1.14* 1.03*  CALCIUM 9.3 9.5 9.8 9.7 9.4  MG  --   --  2.1  --   --    Liver Function Tests: No results for input(s): AST, ALT, ALKPHOS, BILITOT, PROT, ALBUMIN in the last 168 hours. No results for input(s): LIPASE, AMYLASE in the last 168 hours. No results for input(s): AMMONIA in the last 168 hours. CBC:  Recent Labs Lab 06/03/16 0439 06/04/16 0519 06/05/16 0522 06/06/16 0516 06/07/16 0548  WBC 7.8 7.3 5.6 6.1 5.3  HGB 11.8* 11.5* 11.4* 12.4 11.5*  HCT 36.6 36.3 35.4* 38.0 35.9*  MCV 89.5 89.9 90.5 90.3 91.6  PLT 461* 453* 417* 439* 381   Cardiac Enzymes: No results for input(s): CKTOTAL, CKMB, CKMBINDEX, TROPONINI in the last 168 hours. BNP: Invalid input(s): POCBNP CBG: No results for input(s): GLUCAP in the last 168 hours.  Time coordinating discharge:  Greater than 30 minutes  Signed:  Kadrian Partch, DO [facility] Pager: [phone] 06/07/2016, 2:18 PM

## 2016-06-06 NOTE — Progress Notes (Signed)
Patient Name: Elizabeth Morton Date of Encounter: 06/06/2016  Primary Cardiologist: [facility] Problem List     Principal Problem:   Acute on chronic diastolic congestive heart failure [facility]) Active Problems:   Warfarin anticoagulation   S/P aortic valve replacement   Acute blood loss anemia   S/P repair of ventral hernia Nov 2017   Rectus sheath hematoma, initial encounter   Hypotension   Acute respiratory failure (HCC)   Abdominal distension   Bleeding   Pulmonary edema   Rectus sheath hematoma, subsequent encounter     Subjective   Feels well. No abdominal pain.  Hgb 12.4  Inpatient Medications    Scheduled Meds: . docusate sodium  100 mg Oral BID  . levothyroxine  50 mcg Oral QAC breakfast  . pantoprazole  40 mg Oral Daily  . polyethylene glycol  17 g Oral Daily  . potassium chloride  20 mEq Oral BID  . warfarin  5 mg Oral ONCE-1800  . Warfarin - Pharmacist Dosing Inpatient   Does not apply q1800   Continuous Infusions: . heparin 1,050 Units/hr (06/06/16 0852)   PRN Meds: acetaminophen, HYDROmorphone (DILAUDID) injection, ipratropium-albuterol, ondansetron **OR** ondansetron (ZOFRAN) IV, sodium chloride flush, sodium chloride flush   Vital Signs    Vitals:   06/05/16 1243 06/05/16 2043 06/06/16 0456 06/06/16 0850  BP: 120/68 (!) 108/51 101/62 (!) 114/43  Pulse: 82 80 75 74  Resp: 18 18 18    Temp: 97.6 F (36.4 C) 98.6 F (37 C) 98 F (36.7 C) 97.8 F (36.6 C)  TempSrc: Oral Oral Oral Oral  SpO2: 98% 98% 97% 99%  Weight:   159 lb 14.4 oz (72.5 kg)   Height:        Intake/Output Summary (Last 24 hours) at 06/06/16 1135 Last data filed at 06/06/16 0900  Gross per 24 hour  Intake          1158.76 ml  Output             1950 ml  Net          -791.24 ml   Filed Weights   06/04/16 0505 06/05/16 0439 06/06/16 0456  Weight: 164 lb (74.4 kg) 162 lb 3.2 oz (73.6 kg) 159 lb 14.4 oz (72.5 kg)    Physical Exam    GEN: Well nourished, well  developed, in no acute distress.  HEENT: Grossly normal.  Neck: Supple, no JVD, carotid bruits, or masses. Cardiac: RRR, crisp prosthetic clicks, no murmurs, rubs, or gallops. No clubbing, cyanosis, edema.  Radials/DP/PT 2+ and equal bilaterally.  Respiratory:  Respirations regular and unlabored, clear to auscultation bilaterally. GI: Soft, nontender, nondistended, BS + x 4. MS: no deformity or atrophy. Skin: warm and dry, no rash. Neuro:  Strength and sensation are intact. Psych: AAOx3.  Normal affect.  Labs    CBC  Recent Labs  06/05/16 0522 06/06/16 0516  WBC 5.6 6.1  HGB 11.4* 12.4  HCT 35.4* 38.0  MCV 90.5 90.3  PLT 417* [id number]*   Basic Metabolic Panel  Recent Labs  06/04/16 0519 06/06/16 0516  NA 137 136  K 3.8 4.1  CL 104 101  CO2 24 25  GLUCOSE 99 95  BUN 15 17  CREATININE 1.17* 1.33*  CALCIUM 9.5 9.8  MG  --  2.1     Telemetry    NSR - Personally Reviewed  Patient Profile     60 yo woman with rectus sheath hematoma during enoxaparin-warfarin "bridging" for mechanical  AVR following ventral hernia repair, complicated by acute blood loss anemia, mild acute renal insufficiency and acute exacerbation of chronic diastolic heart failure following volume resuscitation. Background problems include CAD s/p CABG and moderately elevated AV prosthesis gradients. CT 05/28/16 showed enlarging hematoma and anticoagulation was reversed with FFP and vit K.  Assessment & Plan    1. CHF: she appears clinically close to euvolemic status, weight is under previous estimated dry weight. Approximately 1L net diuresis yesterday. Place on home dose of diuretic at DC.  2. Mechanical AVR: Tomorrow expect INR>2. 3. Rectus sheath hematoma: Hgb 12 and stable after last transfusion. 4. ARF: resolved. 5.CAD s/p CABG: no angina.  Probably ready for DC tomorrow from Cardiology tomorrow.  Signed, Sanda Klein, MD  06/06/2016, 11:35 AM

## 2016-06-06 NOTE — Progress Notes (Signed)
Patient refused bed alarm. Will continue to monitor.  

## 2016-06-06 NOTE — Progress Notes (Signed)
PROGRESS NOTE  Elizabeth Morton [id number] DOB: 1956/01/14 DOA: 05/24/2016 PCP: [name], MD  Brief History: 60 yo woman with severe AS and CAD (s/p CABG x2 : LIMA to LAD; SVG to diagonal, and AVR (mechanical AVR as part of Bentall procedure in 2006), HTN, HLD, CVA (due to thrombosed aortic aneurysm), GERD, Fe defic anemia, lower GI bleed presented with rectus sheath hematoma during enoxaparin-warfarin "bridging" for mechanical AVR following ventral hernia repair, complicated by moderate acute blood loss anemia, mild acute renal insufficiency and acute exacerbation of chronic diastolic heart failure following volume resuscitation. She underwent a lap-assisted ventral hernia repair by Dr. Hassell Done on 11/3. The surgery and her post-op course were uncomplicated. She was discharged on 11/5 on a lovenox bridge and coumadin, which she takes for her mechanical aortic valve. She has had evolving abdominal wall bruising since surgery, but on 11/12/17when she looked in the mirror felt her abdomen to be more swollen, and has had worsening abdominal wall and back pain, particularly on the left abdominal wall. She has nausea and generally felt ill, so presented to the ER for evaluation. She was noted to have a 5-point drop in her hemoglobin since it was last checked 5 days prior to admisstion. CT 05/28/16 showed enlarging hematoma and anticoagulation was reversed with FFP and vit K.  Assessment/Plan: Acute on chronic diastolic CHF -Continue furosemide under direction of cardiology -Admission weight 173 pounds -06/06/2016 weight 159pounds -question I/O accuracy -plan to restart po lasix 11/26  Mechanical AVR: -INR 1.12. With her type of valve, risk of thrombosis is not extremely high. On IV heparin without bolus and warfarin until INR>2.0  Rectus sheath hematoma:  -Hgb 11 and stable after last transfusion.  Acute Blood Loss Anemia -baseline Hgb ~11 -Hgb nadir  7.0 -transfused 5units PRBC for the admission  AKI -Secondary to hemodynamic changes -Resolved  CAD s/p CABG:  -no angina. Had chronic widespread ST changes, no different from before. Minimal increase in troponin is not unexpected and can be attributed to demand ischemia, not true acute coronary syndrome.  Acute respiratory failure with hypoxia -Likely secondary to CHF -improved with diuresis -Resolved -Presently stable on room air  Hypothyroidism -Continue levothyroxine   Disposition Plan: Home 06/07/16 Family Communication: Husband updatedat bedside 11/23  Consultants: Cardiology, general surgery, CCM  Code Status: FULL  DVT Prophylaxis: IVHeparin/Coumadin   Procedures: As Listed in Progress Note Above  Antibiotics: None   Subjective: Patient denies fevers, chills, headache, chest pain, dyspnea, nausea, vomiting, diarrhea, abdominal pain, dysuria, hematuria, hematochezia, and melena.   Objective: Vitals:   06/05/16 2043 06/06/16 0456 06/06/16 0850 06/06/16 1230  BP: (!) 108/51 101/62 (!) 114/43 (!) 110/46  Pulse: 80 75 74 70  Resp: 18 18  20   Temp: 98.6 F (37 C) 98 F (36.7 C) 97.8 F (36.6 C) 98.1 F (36.7 C)  TempSrc: Oral Oral Oral Oral  SpO2: 98% 97% 99% 100%  Weight:  72.5 kg (159 lb 14.4 oz)    Height:        Intake/Output Summary (Last 24 hours) at 06/06/16 1551 Last data filed at 06/06/16 1300  Gross per 24 hour  Intake          1107.51 ml  Output             2250 ml  Net         -1142.49 ml   Weight change: -1.043 kg (-2 lb 4.8 oz)  Exam:   General:  Pt is alert, follows commands appropriately, not in acute distress  HEENT: No icterus, No thrush, No neck mass, [place]/AT  Cardiovascular: RRR, S1/S2, no rubs, no gallops  Respiratory: CTA bilaterally, no wheezing, no crackles, no rhonchi  Abdomen: Soft/+BS, non tender, non distended, no guarding  Extremities: 1 + LE edema, No lymphangitis, No petechiae, No rashes,  no synovitis   Data Reviewed: I have personally reviewed following labs and imaging studies Basic Metabolic Panel:  Recent Labs Lab 06/01/16 1202 06/02/16 0546 06/03/16 0439 06/04/16 0519 06/06/16 0516  NA 135 134* 136 137 136  K 4.4 3.9 4.0 3.8 4.1  CL 100* 103 102 104 101  CO2 25 22 22 24 25   GLUCOSE 74 91 90 99 95  BUN 14 16 13 15 17   CREATININE 1.00 1.05* 1.13* 1.17* 1.33*  CALCIUM 9.2 9.0 9.3 9.5 9.8  MG  --   --   --   --  2.1   Liver Function Tests: No results for input(s): AST, ALT, ALKPHOS, BILITOT, PROT, ALBUMIN in the last 168 hours. No results for input(s): LIPASE, AMYLASE in the last 168 hours. No results for input(s): AMMONIA in the last 168 hours. Coagulation Profile:  Recent Labs Lab 06/02/16 0546 06/03/16 0439 06/04/16 0519 06/05/16 0522 06/06/16 0516  INR 1.07 1.12 1.37 1.74 1.96   CBC:  Recent Labs Lab 06/02/16 0546 06/03/16 0439 06/04/16 0519 06/05/16 0522 06/06/16 0516  WBC 8.0 7.8 7.3 5.6 6.1  HGB 11.2* 11.8* 11.5* 11.4* 12.4  HCT 34.5* 36.6 36.3 35.4* 38.0  MCV 89.6 89.5 89.9 90.5 90.3  PLT 434* 461* 453* 417* 439*   Cardiac Enzymes: No results for input(s): CKTOTAL, CKMB, CKMBINDEX, TROPONINI in the last 168 hours. BNP: Invalid input(s): POCBNP CBG: No results for input(s): GLUCAP in the last 168 hours. HbA1C: No results for input(s): HGBA1C in the last 72 hours. Urine analysis: No results found for: COLORURINE, APPEARANCEUR, LABSPEC, PHURINE, GLUCOSEU, HGBUR, BILIRUBINUR, KETONESUR, PROTEINUR, UROBILINOGEN, NITRITE, LEUKOCYTESUR Sepsis Labs: @LABRCNTIP (procalcitonin:4,lacticidven:4) )No results found for this or any previous visit (from the past 240 hour(s)).   Scheduled Meds: . docusate sodium  100 mg Oral BID  . [START ON 06/07/2016] furosemide  40 mg Oral Daily  . levothyroxine  50 mcg Oral QAC breakfast  . pantoprazole  40 mg Oral Daily  . polyethylene glycol  17 g Oral Daily  . [START ON 06/07/2016] potassium  chloride  20 mEq Oral Daily  . warfarin  5 mg Oral ONCE-1800  . Warfarin - Pharmacist Dosing Inpatient   Does not apply q1800   Continuous Infusions: . heparin 1,050 Units/hr (06/06/16 0852)    Procedures/Studies: Ct Abdomen Pelvis Wo Contrast  Result Date: 05/28/2016 CLINICAL DATA:  60 year old female inpatient status post umbilical hernia repair [id number], complicated by large ventral abdominopelvic wall hematoma, presenting for follow-up. EXAM: CT ABDOMEN AND PELVIS WITHOUT CONTRAST TECHNIQUE: Multidetector CT imaging of the abdomen and pelvis was performed following the standard protocol without IV contrast. COMPARISON:  05/24/2016 CT abdomen/pelvis. FINDINGS: Lower chest: Small layering right greater than left bilateral pleural effusions, new bilaterally. Mild compressive atelectasis in the dependent lower lobes bilaterally. Patchy mild ground-glass attenuation in the lower parahilar lungs bilaterally appears new. Stable top-normal heart size. Partially visualized aortic valve prosthesis is in place. Visualized lower sternotomy wires appear intact. Hepatobiliary: Normal liver with no liver mass. Cholecystectomy. No biliary ductal dilatation. Pancreas: Normal, with no mass or duct dilation. Spleen: Normal size. No mass. Adrenals/Urinary Tract:  Normal adrenals. No right renal stones. Punctate nonobstructing 2 mm upper left renal stone. No hydronephrosis. No contour deforming renal mass. Normal caliber ureters. Bladder is completely collapsed by indwelling Foley catheter. No bladder stones or bladder wall thickening. Stomach/Bowel: Stable small to moderate hiatal hernia. Otherwise grossly normal stomach. Normal caliber small bowel with no small bowel wall thickening. Normal appendix. Normal large bowel with no diverticulosis, large bowel wall thickening or pericolonic fat stranding. Vascular/Lymphatic: Atherosclerotic nonaneurysmal abdominal aorta. No pathologically enlarged lymph nodes in the abdomen  or pelvis. Reproductive: Grossly normal uterus.  No adnexal mass. Other: No pneumoperitoneum.  No ascites. Large left rectus sheath hematoma measures 9.7 x 4.0 x 14.7 cm, previously 8.6 x 5.0 x 14.7 cm on 05/24/2016 using similar measurement technique, not definitely changed, although with increased layering hyperdense blood products. Large left anterior pelvic extraperitoneal hematoma measures 13.8 x 10.1 x 8.9 cm (series 2/image 69), previously 11.0 x 8.4 x 8.3 cm using similar measurement technique, increased, with increased internal layering hyperdense blood products. New left posterior paranephric space retroperitoneal 4.3 x 4.3 x 6.5 cm hematoma extending superiorly from the extraperitoneal left anterior pelvic hematoma (series 2/image 49). Midline extraperitoneal ventral abdominal 8.3 x 7.0 x 8.0 cm hematoma with layering hyperdense blood products (series 2/image 43), previously 8.2 x 7.0 x 7.9 cm, not appreciably changed. Deep subcutaneous hematoma in the left supraumbilical ventral abdominal wall with internal gas measures 8.2 x 2.6 x 6.0 cm (series 2/image 44), previously 8.2 x 2.8 by 6.2 cm using similar measurement technique, not appreciably changed. Musculoskeletal: No aggressive appearing focal osseous lesions. Mild thoracolumbar spondylosis . IMPRESSION: 1. Interval growth of large left anterior pelvic extraperitoneal hematoma as described. New left retroperitoneal hematoma extending superiorly from the left anterior pelvic extraperitoneal hematoma. Findings suggest ongoing extraperitoneal bleeding in the left anterior pelvis/ left retroperitoneum. 2. Large left rectus muscle sheath, extraperitoneal ventral midline abdominal and deep subcutaneous ventral abdominal wall hematomas appear stable. 3. New small bilateral pleural effusions, right greater than left. No ascites. 4. New patchy mild ground-glass attenuation in the lower parahilar lungs bilaterally, which could represent mild pulmonary edema or  pneumonitis such as from aspiration. 5. Additional findings include aortic atherosclerosis, punctate nonobstructing left renal stone and small to moderate hiatal hernia. These results were called by telephone at the time of interpretation on 05/28/2016 at 12:11 pm to DR. DAN FEINSTEIN, who verbally acknowledged these results. Electronically Signed   By: Ilona Sorrel M.D.   On: 05/28/2016 12:16   Ct Abdomen Pelvis W Contrast  Result Date: 05/24/2016 CLINICAL DATA:  Left-sided abdominal pain with bruising and nausea since yesterday. Patient had umbilical hernia surgery on 05/15/2016. EXAM: CT ABDOMEN AND PELVIS WITH CONTRAST TECHNIQUE: Multidetector CT imaging of the abdomen and pelvis was performed using the standard protocol following bolus administration of intravenous contrast. CONTRAST:  49mL ISOVUE-300 IOPAMIDOL (ISOVUE-300) INJECTION 61% COMPARISON:  06/28/2015 FINDINGS: Lower chest: Subsegmental atelectasis both lung bases. The heart is enlarged. Patient is status post CABG. Hepatobiliary: No focal abnormality within the liver parenchyma. Gallbladder surgically absent. Stable mild prominence extrahepatic bile ducts. Pancreas: No focal mass lesion. No dilatation of the main duct. No intraparenchymal cyst. No peripancreatic edema. Spleen: No splenomegaly. No focal mass lesion. Adrenals/Urinary Tract: No adrenal nodule or mass. Kidneys are unremarkable. No evidence for hydroureter. The urinary bladder appears normal for the degree of distention. Stomach/Bowel: Moderate hiatal hernia noted. Stomach otherwise unremarkable. Duodenum is normally positioned as is the ligament of Treitz. No small bowel  wall thickening. No small bowel dilatation. The terminal ileum is normal. The appendix is normal. No gross colonic mass. No colonic wall thickening. No substantial diverticular change. Vascular/Lymphatic: There is abdominal aortic atherosclerosis without aneurysm. There is no gastrohepatic or hepatoduodenal ligament  lymphadenopathy. No intraperitoneal or retroperitoneal lymphadenopathy. No pelvic sidewall lymphadenopathy. Reproductive: The uterus has normal CT imaging appearance. There is no adnexal mass. Other: Small volume free fluid seen around the liver, in both para colic gutters, and in the anatomic pelvis. Fluid around the liver averages 34 Hounsfield units, consistent with blood. Musculoskeletal: Complex multiloculated anterior abdominal wall hematoma is identified in the abdomen and pelvis. - 3 x 6 x 8 cm collection of blood, fluid, and air is identified in the subcutaneous fat just to the left of the umbilicus. - 7 x 8 x 9 cm hematoma is identified in the midline abdomen. This is probably extraperitoneal given the focal nature of the hematoma, but loculated within the omentum cannot be entirely excluded. This hematoma appears to communicate with the hematoma described immediately below. - 6 x 9 x 11 cm hematoma is identified in the left rectus sheath and this appears to communicate with the hematoma listed below. - 8 x 10 x 9 cm hematoma in the space of Retzius Multiple areas of gas and fluid are seen in the subcutaneous fat of the anterior abdominal wall. Many of these were seen on the preoperative study from 06/28/2015 and may represent injection sites. Bone windows reveal no worrisome lytic or sclerotic osseous lesions. IMPRESSION: 1. Multiloculated large hematoma of the anterior abdominal wall extends from the umbilicus down to the symphysis pubis. A Component contained in the left rectus sheath extends down into the space of Retzius. There appears to be communication between the rectus sheath hematoma and a midline hematoma which is probably extraperitoneal anteriorly between the transversalis fascia and the peritoneal lining given the focal appearance although containment within the omentum/gastrocolic ligament is possible but considered less likely. There is also a superficial component of the hematoma seen  anterior to the rectus fascia. 2. Small volume hemoperitoneum. 3. Moderate hiatal hernia. Electronically Signed   By: Misty Stanley M.D.   On: 05/24/2016 14:22   Dg Chest Port 1 View  Result Date: 05/28/2016 CLINICAL DATA:  Acute respiratory failure EXAM: PORTABLE CHEST 1 VIEW COMPARISON:  05/27/2016 FINDINGS: 0436 hours. Low volume film. The cardio pericardial silhouette is enlarged. Pulmonary vascular congestion with interstitial and patchy basilar a (right greater than left) airspace disease persists without substantial change. Small bilateral pleural effusions noted. The visualized bony structures of the thorax are intact. Telemetry leads overlie the chest. IMPRESSION: Cardiomegaly with vascular congestion and patchy bibasilar airspace disease shows no substantial change. Small bilateral pleural effusions. Electronically Signed   By: Misty Stanley M.D.   On: 05/28/2016 08:38   Dg Chest Port 1 View  Result Date: 05/27/2016 CLINICAL DATA:  Shortness of breath/respiratory failure EXAM: PORTABLE CHEST 1 VIEW COMPARISON:  May 26, 2016 FINDINGS: Central catheter tip is in the superior cava. No pneumothorax. There remains patchy airspace disease throughout the right mid lower lung zones. There is new consolidation in the medial left base. There is cardiomegaly with pulmonary vascularity within normal limits. Patient is status post aortic valve replacement. There is atherosclerotic calcification in the aorta. No adenopathy. No bone lesions. IMPRESSION: New consolidation medial left base. Patchy airspace opacity in the right middle lung zone regions remains. These areas are felt most likely represent multifocal pneumonia. Stable cardiac  silhouette. There is aortic atherosclerosis. No evident pneumothorax. Electronically Signed   By: Lowella Grip III M.D.   On: 05/27/2016 08:06   Dg Chest Port 1 View  Result Date: 05/26/2016 CLINICAL DATA:  Status post central line placement ; history of CABG,  aortic valve replacement EXAM: PORTABLE CHEST 1 VIEW COMPARISON:  Portable chest x-ray of May 26, 2016 at 9:56 a.m. FINDINGS: There has been interval placement of right internal jugular venous catheter. The tip overlies the midportion of the SVC. There is no postprocedure pneumothorax or hemo thorax. The lungs are mildly hypoinflated. The interstitial markings remain increased diffusely with areas of confluence. The cardiac silhouette remains enlarged. IMPRESSION: No postprocedure complication following right internal jugular venous catheter placement. Electronically Signed   By: Jun Osment  [place] M.D.   On: 05/26/2016 14:04   Dg Chest Port 1 View  Result Date: 05/26/2016 CLINICAL DATA:  Shortness of breath, chest pain. History of coronary artery disease, aortic valve replacement for aortic stenosis, previous CVA, former smoker. EXAM: PORTABLE CHEST 1 VIEW COMPARISON:  PA and lateral chest x-ray of July 15, 2012 FINDINGS: The lungs are hypoinflated. The interstitial markings are diffusely increased. The pulmonary vascularity is engorged. The cardiac silhouette is enlarged. There is partial obscuration of the left hemidiaphragm. The sternal wires are intact. The prosthetic aortic valve ring is in stable position. There is calcification in the wall of the thoracic aortic arch. The bony thorax is unremarkable. IMPRESSION: CHF with pulmonary interstitial and mild alveolar edema. No definite pneumonia. Thoracic aortic atherosclerosis. Electronically Signed   By: Legrande Hao  [place] M.D.   On: 05/26/2016 10:18   Dg Abd Portable 1v  Result Date: 05/26/2016 CLINICAL DATA:  Abdominal distention EXAM: PORTABLE ABDOMEN - 1 VIEW COMPARISON:  CT from 2 days ago FINDINGS: Hazy density in the central abdomen with paucity of bowel loops correlating with large abdominal wall and extraperitoneal hematoma. No evidence of bowel obstruction. Cholecystectomy clips. IMPRESSION: Mass effect from known abdominal wall and  extraperitoneal hematoma. Normal bowel gas pattern. Electronically Signed   By: Monte Fantasia M.D.   On: 05/26/2016 07:47    Harwood Nall, DO  [facility] Pager [phone]  If 7PM-7AM, please contact night-coverage [email or web address] Password [email or web address] 06/06/2016, 3:51 PM   LOS: 13 days

## 2016-06-06 NOTE — Progress Notes (Signed)
[place] for heparin / warfarin  Indication: Mechanical AVR  No Known Allergies  Patient Measurements: Height: 5\' 2"  (157.5 cm) Weight: 159 lb 14.4 oz (72.5 kg) (Scale B) IBW/kg (Calculated) : 50.1 Heparin Dosing Weight: 65 kg  Vital Signs: Temp: 98 F (36.7 C) (11/25 0456) Temp Source: Oral (11/25 0456) BP: 101/62 (11/25 0456) Pulse Rate: 75 (11/25 0456)  Labs:  Recent Labs  06/04/16 0519  06/05/16 0522 06/05/16 1143 06/05/16 2029 06/06/16 0516  HGB 11.5*  --  11.4*  --   --  12.4  HCT 36.3  --  35.4*  --   --  38.0  PLT 453*  --  417*  --   --  439*  LABPROT 17.0*  --  20.6*  --   --  22.6*  INR 1.37  --  1.74  --   --  1.96  HEPARINUNFRC 0.28*  < > 0.67 0.79* 0.48 0.44  CREATININE 1.17*  --   --   --   --  1.33*  < > = values in this interval not displayed.  Estimated Creatinine Clearance: 42.5 mL/min (by C-G formula based on SCr of 1.33 mg/dL (H)).   Medical History: Past Medical History:  Diagnosis Date  . Aortic stenosis    Aortic valve replacement 2006  . CAD (coronary artery disease)    a. s/p CABG 2006 at time of AVR.  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 Dyslipidemia   . Gallstones   . GERD (gastroesophageal reflux disease)   . Heart murmur   . Hx of CABG    2006,LIMA to LAD, SVG to diagonal  . Hx of transfusion of packed red blood cells   . Hypertension   . Hypothyroidism   . Iron deficiency anemia   . Lower GI bleed 06/2015  . Pericardial effusion    Insignificant small pericardial effusion seen on echo, November, 2013  . Peripheral vascular disease ([place]) 06   blood clot -stroke  . S/P aortic valve replacement    a. Bentall procedure,#21 [employer] mechanical valve conduit with reimplantation of the coronaries 2006  . Status post colonoscopy with polypectomy    "bleeding; colonoscopy was ~ 06/18/2015"  . Stroke [facility]) 2006   no deficits   . Warfarin  anticoagulation    coumadin therapy     Assessment: 60 yo female with mechanical AVR, she was bridged perioperatively with Lovenox as an outpatient but developed multiple abdominal wall/rectus sheath hematomas and had a 5 g drop in her hemoglobin. She has received multiple units of pRBC and FFP throughout her admission. All anticoagulation was resumed 11/18, now overcoming vitamin K resistance.  INR 1.96 this morning, HL 0.44 (therapeutic), Hgb stable 12.4, Plt 439.  No s/sx of bleeding noted.  PTA warfarin dose is 3mg  every day except 4.5mg  on Friday  Goal of Therapy:  Heparin level 0.3-0.7 units/ml Monitor platelets by anticoagulation protocol: Yes INR 2-3  Plan:  Continue heparin 1050 units/hr Warfarin 5mg  tonight Daily INR/heparin level/CBC Monitor for s/sx bleeding Follow up d/c heparin once INR >2  Dimitri Ped, PharmD. PGY-2 Infectious Diseases Pharmacy Resident Pager: [phone] 06/06/2016 7:37 AM

## 2016-06-07 LAB — BASIC METABOLIC PANEL
Anion gap: 8 (ref 5–15)
Anion gap: 10 (ref 5–15)
BUN: 14 mg/dL (ref 6–20)
BUN: 12 mg/dL (ref 6–20)
CALCIUM: 9.7 mg/dL (ref 8.9–10.3)
CALCIUM: 9.4 mg/dL (ref 8.9–10.3)
CHLORIDE: 104 mmol/L (ref 101–111)
CO2: 24 mmol/L (ref 22–32)
CO2: 21 mmol/L — AB (ref 22–32)
CREATININE: 1.03 mg/dL — AB (ref 0.44–1.00)
Chloride: 106 mmol/L (ref 101–111)
Creatinine, Ser: 1.14 mg/dL — ABNORMAL HIGH (ref 0.44–1.00)
GFR calc Af Amer: 60 mL/min — ABNORMAL LOW (ref >60)
GFR calc Af Amer: >60 mL/min (ref >60)
GFR calc non Af Amer: 58 mL/min — ABNORMAL LOW (ref >60)
GFR, EST NON AFRICAN AMERICAN: 52 mL/min — AB (ref >60)
GLUCOSE: 95 mg/dL (ref 65–99)
GLUCOSE: 90 mg/dL (ref 65–99)
Potassium: 5.9 mmol/L — ABNORMAL HIGH (ref 3.5–5.1)
Potassium: 4.4 mmol/L (ref 3.5–5.1)
Sodium: 138 mmol/L (ref 135–145)
Sodium: 135 mmol/L (ref 135–145)

## 2016-06-07 LAB — CBC
HCT: 35.9 % — ABNORMAL LOW (ref 36.0–46.0)
Hemoglobin: 11.5 g/dL — ABNORMAL LOW (ref 12.0–15.0)
MCH: 29.3 pg (ref 26.0–34.0)
MCHC: 32 g/dL (ref 30.0–36.0)
MCV: 91.6 fL (ref 78.0–100.0)
Platelets: 381 K/uL (ref 150–400)
RBC: 3.92 MIL/uL (ref 3.87–5.11)
RDW: 16.1 % — ABNORMAL HIGH (ref 11.5–15.5)
WBC: 5.3 K/uL (ref 4.0–10.5)

## 2016-06-07 LAB — PROTIME-INR
INR: 2.19
Prothrombin Time: 24.7 s — ABNORMAL HIGH (ref 11.4–15.2)

## 2016-06-07 LAB — HEPARIN LEVEL (UNFRACTIONATED): Heparin Unfractionated: 0.43 IU/mL (ref 0.30–0.70)

## 2016-06-07 MED ORDER — WARFARIN SODIUM 3 MG PO TABS: 3.0000 mg | ORAL_TABLET | ORAL | 0 refills | Status: DC

## 2016-06-07 MED ORDER — FUROSEMIDE 40 MG PO TABS: 40.0000 mg | ORAL_TABLET | Freq: Every day | ORAL | 1 refills | Status: DC

## 2016-06-07 NOTE — Progress Notes (Signed)
Patient alert and oriented, denies pain,v/s stable, iv and tele d/c. Discharge instruction explain and given to the patient. All questions answered, patient verbalized understanding. D/c pt. Home per order.

## 2016-06-07 NOTE — Progress Notes (Signed)
Patient Name: Elizabeth Morton Date of Encounter: 06/07/2016  Primary Cardiologist: [facility] Problem List     Principal Problem:   Acute on chronic diastolic congestive heart failure [facility]) Active Problems:   Warfarin anticoagulation   S/P aortic valve replacement   Acute blood loss anemia   S/P repair of ventral hernia Nov 2017   Rectus sheath hematoma, initial encounter   Hypotension   Acute respiratory failure (HCC)   Abdominal distension   Bleeding   Pulmonary edema   Rectus sheath hematoma, subsequent encounter     Subjective   No complaints, eager to go home.  Inpatient Medications    Scheduled Meds: . docusate sodium  100 mg Oral BID  . furosemide  40 mg Oral Daily  . levothyroxine  50 mcg Oral QAC breakfast  . pantoprazole  40 mg Oral Daily  . polyethylene glycol  17 g Oral Daily  . Warfarin - Pharmacist Dosing Inpatient   Does not apply q1800   Continuous Infusions:  PRN Meds: acetaminophen, HYDROmorphone (DILAUDID) injection, ipratropium-albuterol, ondansetron **OR** ondansetron (ZOFRAN) IV, sodium chloride flush, sodium chloride flush   Vital Signs    Vitals:   06/06/16 0850 06/06/16 1230 06/06/16 2100 06/07/16 0532  BP: (!) 114/43 (!) 110/46 (!) 109/50 (!) 111/54  Pulse: 74 70 73 68  Resp:  20 18 18   Temp: 97.8 F (36.6 C) 98.1 F (36.7 C) 98.1 F (36.7 C) 97.9 F (36.6 C)  TempSrc: Oral Oral Oral Oral  SpO2: 99% 100% 100% 96%  Weight:    161 lb 1.6 oz (73.1 kg)  Height:        Intake/Output Summary (Last 24 hours) at 06/07/16 1204 Last data filed at 06/07/16 0535  Gross per 24 hour  Intake              864 ml  Output             1800 ml  Net             -936 ml   Filed Weights   06/05/16 0439 06/06/16 0456 06/07/16 0532  Weight: 162 lb 3.2 oz (73.6 kg) 159 lb 14.4 oz (72.5 kg) 161 lb 1.6 oz (73.1 kg)    Physical Exam    GEN: Well nourished, well developed, in no acute distress.  HEENT: Grossly normal.  Neck: Supple,  no JVD, carotid bruits, or masses. Cardiac: RRR, no murmurs, rubs, or gallops. No clubbing, cyanosis, edema.  Radials/DP/PT 2+ and equal bilaterally.  Respiratory:  Respirations regular and unlabored, clear to auscultation bilaterally. GI: Soft, nontender, nondistended, BS + x 4. MS: no deformity or atrophy. Skin: warm and dry, no rash. Neuro:  Strength and sensation are intact. Psych: AAOx3.  Normal affect.  Labs    CBC  Recent Labs  06/06/16 0516 06/07/16 0548  WBC 6.1 5.3  HGB 12.4 11.5*  HCT 38.0 35.9*  MCV 90.3 91.6  PLT 439* [id number]   Basic Metabolic Panel  Recent Labs  06/06/16 0516 06/07/16 0548 06/07/16 1017  NA 136 138 135  K 4.1 5.9* 4.4  CL 101 106 104  CO2 25 24 21*  GLUCOSE 95 95 90  BUN 17 14 12   CREATININE 1.33* 1.14* 1.03*  CALCIUM 9.8 9.7 9.4  MG 2.1  --   --      Telemetry    NSR - Personally Reviewed  Patient Profile     60 yo woman with rectus sheath hematoma during enoxaparin-warfarin "  bridging" for mechanical AVR following ventral hernia repair, complicated by acute blood loss anemia, mild acute renal insufficiency and acute exacerbation of chronic diastolic heart failure following volume resuscitation. Background problems include CAD s/p CABG and moderately elevated AV prosthesis gradients. CT 05/28/16 showed enlarging hematoma and anticoagulation was reversed with FFP and vit K.   Assessment & Plan    1. CHF: weight is at previous estimated dry weight.  Place on home dose of diuretic at DC.  2. Mechanical AVR: INR>2. Stop heparin IV. OK for DC from Cardiology standpoint. 3. Rectus sheath hematoma: Hgb 12 and stable after last transfusion. 4.CAD s/p CABG: no angina.   Signed, Sanda Klein, MD  06/07/2016, 12:04 PM

## 2016-06-08 ENCOUNTER — Ambulatory Visit: Payer: Commercial Managed Care - HMO

## 2016-06-08 ENCOUNTER — Telehealth (HOSPITAL_COMMUNITY): Payer: Self-pay | Admitting: *Deleted

## 2016-06-08 NOTE — Telephone Encounter (Signed)
-----   Message from Cheryln Manly, NP sent at 06/07/2016  1:09 PM EST ----- Hey,  This patient will need follow up in the AF clinic in 1-2 weeks. Thanks!   Reino Bellis NP-c

## 2016-06-08 NOTE — Telephone Encounter (Signed)
I cld pt per follow up instructions.  Pt stated that there was no mention of Afib and she has several other appts to sched at this time.  She will not follow up with our clinic at this time.  She sees Dr. Meda Coffee in December

## 2016-06-17 ENCOUNTER — Ambulatory Visit (INDEPENDENT_AMBULATORY_CARE_PROVIDER_SITE_OTHER): Payer: Commercial Managed Care - HMO | Admitting: Pharmacist

## 2016-06-17 DIAGNOSIS — I639 Cerebral infarction, unspecified: Secondary | ICD-10-CM

## 2016-06-17 DIAGNOSIS — I359 Nonrheumatic aortic valve disorder, unspecified: Secondary | ICD-10-CM | POA: Diagnosis not present

## 2016-06-17 DIAGNOSIS — I6350 Cerebral infarction due to unspecified occlusion or stenosis of unspecified cerebral artery: Secondary | ICD-10-CM

## 2016-06-17 DIAGNOSIS — Z952 Presence of prosthetic heart valve: Secondary | ICD-10-CM

## 2016-06-17 DIAGNOSIS — Z7901 Long term (current) use of anticoagulants: Secondary | ICD-10-CM | POA: Diagnosis not present

## 2016-06-17 LAB — POCT INR: INR: 3.4

## 2016-07-01 ENCOUNTER — Ambulatory Visit (INDEPENDENT_AMBULATORY_CARE_PROVIDER_SITE_OTHER): Payer: Commercial Managed Care - HMO | Admitting: *Deleted

## 2016-07-01 DIAGNOSIS — I6350 Cerebral infarction due to unspecified occlusion or stenosis of unspecified cerebral artery: Secondary | ICD-10-CM

## 2016-07-01 DIAGNOSIS — Z7901 Long term (current) use of anticoagulants: Secondary | ICD-10-CM | POA: Diagnosis not present

## 2016-07-01 DIAGNOSIS — Z952 Presence of prosthetic heart valve: Secondary | ICD-10-CM | POA: Diagnosis not present

## 2016-07-01 DIAGNOSIS — I359 Nonrheumatic aortic valve disorder, unspecified: Secondary | ICD-10-CM | POA: Diagnosis not present

## 2016-07-01 DIAGNOSIS — I639 Cerebral infarction, unspecified: Secondary | ICD-10-CM | POA: Diagnosis not present

## 2016-07-01 LAB — POCT INR: INR: 3.3

## 2016-07-07 ENCOUNTER — Ambulatory Visit: Payer: Commercial Managed Care - HMO | Admitting: Cardiology

## 2016-07-07 ENCOUNTER — Other Ambulatory Visit (HOSPITAL_COMMUNITY): Payer: Commercial Managed Care - HMO

## 2016-07-24 ENCOUNTER — Ambulatory Visit (INDEPENDENT_AMBULATORY_CARE_PROVIDER_SITE_OTHER): Payer: Commercial Managed Care - HMO | Admitting: *Deleted

## 2016-07-24 DIAGNOSIS — Z952 Presence of prosthetic heart valve: Secondary | ICD-10-CM | POA: Diagnosis not present

## 2016-07-24 LAB — POCT INR: INR: 2.4

## 2016-08-20 ENCOUNTER — Ambulatory Visit (INDEPENDENT_AMBULATORY_CARE_PROVIDER_SITE_OTHER): Payer: Commercial Managed Care - HMO | Admitting: Cardiology

## 2016-08-20 ENCOUNTER — Encounter (INDEPENDENT_AMBULATORY_CARE_PROVIDER_SITE_OTHER): Payer: Self-pay

## 2016-08-20 ENCOUNTER — Ambulatory Visit (INDEPENDENT_AMBULATORY_CARE_PROVIDER_SITE_OTHER): Payer: Commercial Managed Care - HMO | Admitting: *Deleted

## 2016-08-20 VITALS — BP 122/58 | HR 64 | Ht 62.0 in | Wt 150.0 lb

## 2016-08-20 DIAGNOSIS — Z789 Other specified health status: Secondary | ICD-10-CM | POA: Diagnosis not present

## 2016-08-20 DIAGNOSIS — Z952 Presence of prosthetic heart valve: Secondary | ICD-10-CM

## 2016-08-20 DIAGNOSIS — Z7901 Long term (current) use of anticoagulants: Secondary | ICD-10-CM | POA: Diagnosis not present

## 2016-08-20 DIAGNOSIS — Z951 Presence of aortocoronary bypass graft: Secondary | ICD-10-CM | POA: Diagnosis not present

## 2016-08-20 DIAGNOSIS — I2510 Atherosclerotic heart disease of native coronary artery without angina pectoris: Secondary | ICD-10-CM

## 2016-08-20 DIAGNOSIS — I350 Nonrheumatic aortic (valve) stenosis: Secondary | ICD-10-CM

## 2016-08-20 LAB — POCT INR: INR: 1.9

## 2016-08-20 NOTE — Progress Notes (Signed)
Cardiology Office Note    Date:  08/20/2016  ID:  Elizabeth Morton, DOB Nov 11, 1955, MRN [id number] PCP:  [name], MD  Cardiologist:  Dr. Meda Coffee   Chief Complaint: pre-op evaluation  History of Present Illness:  Elizabeth Morton is a 61 y.o. 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 Her anticoagulation is being managed with Lovenox bridging through our Coumadin clinic. Dr. Meda Coffee advised repeat echo prior to surgery given h/o elevated gradients on last year's echo - 2D ccho 05/08/16 showed EF 60-65%, grade 1 DD, moderate aortic stenosis, transvalvular velocity increased compared to 2013 but unchanged from 2016 (57mmHg mean, 28mmHg peak), mod LAE, trivial pericardial effusion. Last CBC 08/2015 with Hgb 12.5, last Cr 1.08 in 06/2015.  She presented to the clinic in 03/2016 for pre-operative evaluation. She has not had any chest pain, SOB, chest pressure, dizziness, pre-syncope or syncope. No LEE, orthopnea. She has noticed some weight gain which she attributes to her hernia. She admits she hasn't been as active as she should be, but has not noticed any exertional limitation with ADLs and moderate exertion such as walking all the way from one end of [employer] to the other uninterrupted.  08/20/2016 - The patient was hospitalized in November 2017 for elective ventral hernia repair that was complicated by acute blood loss with significant rectal sheath hematoma. These happen when the patient was being bridged with Lovenox. This is now a second occasion when this happened and patient was advised not to use  Lovenox injections in the future. Her bleeding required use of FFP's and vitamin K.  Today she states that she has been recovering from this hospitalization finally feels stronger denies any significant dyspnea on exertion other than her baseline. Denies any chest pain or lower extremity edema orthopnea or paroxysmal nocturnal dyspnea. She is now back on her right has no further bleeding.   Past Medical History:  Diagnosis Date  . Aortic stenosis    Aortic valve replacement 2006  . CAD (coronary artery disease)    a. s/p CABG 2006 at time of AVR.  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 Dyslipidemia   . Gallstones   . GERD (gastroesophageal reflux disease)   . Heart murmur   . Hx of CABG    2006,LIMA to LAD, SVG to diagonal  . Hx of transfusion of packed red blood cells   . Hypertension   . Hypothyroidism   . Iron deficiency anemia   . Lower GI bleed 06/2015  . Pericardial effusion    Insignificant small pericardial effusion seen on echo, November, 2013  . Peripheral vascular disease ([place]) 06   blood clot -stroke  . S/P aortic valve replacement    a. Bentall procedure,#21 [employer] mechanical valve conduit with reimplantation of the coronaries 2006  . Status post colonoscopy with polypectomy    "bleeding; colonoscopy was ~ 06/18/2015"  . Stroke [facility]) 2006   no deficits   . Warfarin anticoagulation    coumadin therapy    Past Surgical History:  Procedure Laterality Date  . AORTIC VALVE REPLACEMENT  2006  . BACK SURGERY    .  CARDIAC CATHETERIZATION  ~ 2006  . CARDIAC VALVE REPLACEMENT    . CESAREAN SECTION  1985  . COLONOSCOPY N/A 06/26/2015   Procedure: COLONOSCOPY;  Surgeon: Gatha Mayer, MD;  Location: [facility];  Service: Endoscopy;  Laterality: N/A;  . COLONOSCOPY W/ BIOPSIES AND POLYPECTOMY  06/18/2015  . CORONARY ARTERY BYPASS GRAFT  2006  . ESOPHAGOGASTRODUODENOSCOPY  06/18/2015  . INSERTION OF MESH  05/15/2016   Procedure:  INSERTION OF MESH;  Surgeon: Johnathan Hausen, MD;  Location: [facility];  Service: General;;  . LAPAROSCOPIC ASSISTED VENTRAL HERNIA REPAIR N/A 05/15/2016   Procedure: LAPAROSCOPIC ASSISTED VENTRAL HERNIA REPAIR WITH MESH;  Surgeon: Johnathan Hausen, MD;  Location: [facility];  Service: General;  Laterality: N/A;  . LAPAROSCOPIC CHOLECYSTECTOMY SINGLE PORT  07/21/2012   Procedure: LAPAROSCOPIC CHOLECYSTECTOMY SINGLE PORT;  Surgeon: Adin Hector, MD;  Location: [facility];  Service: General;  Laterality: N/A;  . LUMBAR FUSION  ~ 1973  . TONSILLECTOMY      Current Medications: Current Outpatient Prescriptions  Medication Sig Dispense Refill  . acetaminophen (TYLENOL) 500 MG tablet Take 500-1,000 mg by mouth every 6 (six) hours as needed (for pain).    . Calcium Carbonate-Vitamin D (CALCIUM 600+D) 600-400 MG-UNIT per tablet Take 1 tablet by mouth 2 (two) times daily.    . furosemide (LASIX) 40 MG tablet Take 1 tablet (40 mg total) by mouth daily. 30 tablet 1  . levothyroxine (SYNTHROID, LEVOTHROID) 50 MCG tablet Take 50 mcg by mouth daily before breakfast.    . omeprazole (PRILOSEC) 20 MG capsule Take 20 mg by mouth daily.      . potassium chloride (MICRO-K) 10 MEQ CR capsule TAKE 1 TABLET BY MOUTH EVERY DAY 15 capsule 0  . simvastatin (ZOCOR) 40 MG tablet Take 1 tablet (40 mg total) by mouth at bedtime. 15 tablet 0  . triazolam (HALCION) 0.25 MG tablet Take 1 tablet by mouth at bedtime.    Marland Kitchen warfarin (COUMADIN) 3 MG tablet Take 1-1.5 tablets (3-4.5 mg total) by mouth See admin instructions. Take 1 1/2 tablets (4.5 mg) by mouth on Friday evening (between 7pm and 7:30pm), take 1 tablet (3 mg) on all other evenings of the week (Sat thru Thurs) 30 tablet 0   No current facility-administered medications for this visit.      Allergies:   Patient has no known allergies.   Social History   Social History  . Marital status: Married    Spouse name: N/A  . Number of children: 1  . Years of education: N/A    Occupational History  . homemaker    Social History Main Topics  . Smoking status: Former Smoker    Packs/day: 1.00    Years: 25.00    Types: Cigarettes    Quit date: 07/15/2004  . Smokeless tobacco: Never Used  . Alcohol use No  . Drug use: No  . Sexual activity: No   Other Topics Concern  . Not on file   Social History Narrative   She lives in [place] with her husband and son. She previously smoked 37-pack-years,but quit following her stroke in April of 2006. She denies any alcohol or drugs. She has not been routinely exercising.     Family History:  The patient's family history includes Alcohol abuse in her father; Heart disease in her mother; Hyperlipidemia in her father; Hypertension in her father; Stroke in her brother, father, and mother.   ROS:   Please see the history of present illness.  All other systems are reviewed and otherwise negative.    PHYSICAL EXAM:   VS:  BP (!) 122/58   Pulse 64   Ht 5\' 2"  (1.575 m)   Wt 150 lb (68 kg)   SpO2 99%   BMI 27.44 kg/m   BMI: Body mass index is 27.44 kg/m. GEN: Well nourished, well developed WF, in no acute distress  HEENT: normocephalic, atraumatic Neck: no JVD, carotid bruits, or masses Cardiac: RRR; crisp valve click, 2/6 SEM localized at RUSB, no rubs or gallops, no edema  Respiratory:  clear to auscultation bilaterally, normal work of breathing GI: soft, nontender, nondistended, + BS MS: no deformity or atrophy  Skin: warm and dry, no rash Neuro:  Alert and Oriented x 3, Strength and sensation are intact, follows commands Psych: euthymic mood, full affect  Wt Readings from Last 3 Encounters:  08/20/16 150 lb (68 kg)  06/07/16 161 lb 1.6 oz (73.1 kg)  05/15/16 164 lb (74.4 kg)      Studies/Labs Reviewed:   EKG:  EKG was ordered today and personally reviewed by me and demonstrates NSR 82bpm, nonspecific ST-T changes, similar to prior going back to 2010 even. (baseline wander noted)  Recent  Labs: 05/24/2016: ALT 16 05/28/2016: B Natriuretic Peptide 2,012.1 06/06/2016: Magnesium 2.1 06/07/2016: BUN 12; Creatinine, Ser 1.03; Hemoglobin 11.5; Platelets 381; Potassium 4.4; Sodium 135   Lipid Panel No results found for: CHOL, TRIG, HDL, CHOLHDL, VLDL, LDLCALC, LDLDIRECT  Additional studies/ records that were reviewed today include: Summarized above.      ASSESSMENT & PLAN:   1. Aortic stenosis s/p mechanical AVR - increased transaortic gradients - on the most recent echocardiogram on 05/29/2016 mean gradient 41 mmHg. The patient denies any worsening symptoms. On Warfarin with no signs of bleeding. She has had her blood work checked today at her primary care office we will request the results. We will follow the patient in 6 months and perform an echocardiogram prior to that appointment. It is possible the patient had prosthesis-patient mismatch causing elevated velocities. However mean gradient 15 mmHg in 2011, 30 mmHg in 2016 and 41 mmHg in 2017. 2. CAD s/p CABG - no recent anginal sx. Not on ASA due to h/o bleeding issues while on concomitant warfarin (reviewed with Dr. Meda Coffee). Continue statin. At f/u OV would recommend obtaining a copy of f/u lipids (monitored by PCP) and considering titration to higher intensity statin. 3. Essential HTN - controlled.  Disposition: F/u with Dr. Meda Coffee in 6 months. Echocardiogram prior to the appointment.   Medication Adjustments/Labs and Tests Ordered: Current medicines are reviewed at length with the patient today.  Concerns regarding medicines are outlined above. Medication changes, Labs and Tests ordered today are summarized above and listed in the Patient Instructions accessible in Encounters.   Signed, Ena Dawley, MD 08/20/2016 11:51 AM    [address], [place], [place]  [address] Phone: [phone]; Fax: [phone]

## 2016-08-20 NOTE — Patient Instructions (Signed)
Medication Instructions:   Your physician recommends that you continue on your current medications as directed. Please refer to the Current Medication list given to you today.    Testing/Procedures:  Your physician has requested that you have an echocardiogram. Echocardiography is a painless test that uses sound waves to create images of your heart. It provides your doctor with information about the size and shape of your heart and how well your heart's chambers and valves are working. This procedure takes approximately one hour. There are no restrictions for this procedure. PLEASE SCHEDULE ECHO PRIOR TO 6 MONTH FOLLOW-UP APPOINTMENT WITH DR Meda Coffee     Follow-Up:  Your physician wants you to follow-up in: [place] will receive a reminder letter in the mail two months in advance. If you don't receive a letter, please call our office to schedule the follow-up appointment.  PLEASE SCHEDULE ECHO A WEEK OR 2 PRIOR TO THIS APPOINTMENT        If you need a refill on your cardiac medications before your next appointment, please call your pharmacy.

## 2016-09-17 ENCOUNTER — Ambulatory Visit (INDEPENDENT_AMBULATORY_CARE_PROVIDER_SITE_OTHER): Payer: Commercial Managed Care - HMO | Admitting: *Deleted

## 2016-09-17 DIAGNOSIS — Z952 Presence of prosthetic heart valve: Secondary | ICD-10-CM | POA: Diagnosis not present

## 2016-09-17 LAB — POCT INR: INR: 2.3

## 2016-10-15 ENCOUNTER — Ambulatory Visit (INDEPENDENT_AMBULATORY_CARE_PROVIDER_SITE_OTHER): Payer: Commercial Managed Care - HMO | Admitting: *Deleted

## 2016-10-15 DIAGNOSIS — Z952 Presence of prosthetic heart valve: Secondary | ICD-10-CM

## 2016-10-15 LAB — POCT INR: INR: 1.9

## 2016-11-01 ENCOUNTER — Other Ambulatory Visit: Payer: Self-pay | Admitting: Cardiology

## 2016-11-12 ENCOUNTER — Ambulatory Visit (INDEPENDENT_AMBULATORY_CARE_PROVIDER_SITE_OTHER): Payer: Commercial Managed Care - HMO | Admitting: *Deleted

## 2016-11-12 ENCOUNTER — Encounter (INDEPENDENT_AMBULATORY_CARE_PROVIDER_SITE_OTHER): Payer: Self-pay

## 2016-11-12 DIAGNOSIS — Z952 Presence of prosthetic heart valve: Secondary | ICD-10-CM | POA: Diagnosis not present

## 2016-11-12 DIAGNOSIS — Z7901 Long term (current) use of anticoagulants: Secondary | ICD-10-CM

## 2016-11-12 LAB — POCT INR: INR: 2.1

## 2016-12-11 ENCOUNTER — Ambulatory Visit (INDEPENDENT_AMBULATORY_CARE_PROVIDER_SITE_OTHER): Payer: Commercial Managed Care - HMO

## 2016-12-11 DIAGNOSIS — Z952 Presence of prosthetic heart valve: Secondary | ICD-10-CM | POA: Diagnosis not present

## 2016-12-11 DIAGNOSIS — Z7901 Long term (current) use of anticoagulants: Secondary | ICD-10-CM

## 2016-12-11 LAB — POCT INR: INR: 2

## 2017-01-26 ENCOUNTER — Encounter (INDEPENDENT_AMBULATORY_CARE_PROVIDER_SITE_OTHER): Payer: Self-pay

## 2017-01-26 ENCOUNTER — Ambulatory Visit (INDEPENDENT_AMBULATORY_CARE_PROVIDER_SITE_OTHER): Payer: 59 | Admitting: Pharmacist Clinician (PhC)/ Clinical Pharmacy Specialist

## 2017-01-26 DIAGNOSIS — Z7901 Long term (current) use of anticoagulants: Secondary | ICD-10-CM

## 2017-01-26 DIAGNOSIS — Z952 Presence of prosthetic heart valve: Secondary | ICD-10-CM

## 2017-01-26 LAB — POCT INR: INR: 1.6

## 2017-02-10 ENCOUNTER — Ambulatory Visit (INDEPENDENT_AMBULATORY_CARE_PROVIDER_SITE_OTHER): Payer: 59 | Admitting: *Deleted

## 2017-02-10 ENCOUNTER — Ambulatory Visit (HOSPITAL_COMMUNITY): Payer: 59 | Attending: Cardiology

## 2017-02-10 ENCOUNTER — Other Ambulatory Visit: Payer: Self-pay

## 2017-02-10 DIAGNOSIS — I350 Nonrheumatic aortic (valve) stenosis: Secondary | ICD-10-CM | POA: Insufficient documentation

## 2017-02-10 DIAGNOSIS — Z951 Presence of aortocoronary bypass graft: Secondary | ICD-10-CM | POA: Insufficient documentation

## 2017-02-10 DIAGNOSIS — I340 Nonrheumatic mitral (valve) insufficiency: Secondary | ICD-10-CM | POA: Insufficient documentation

## 2017-02-10 DIAGNOSIS — Z7901 Long term (current) use of anticoagulants: Secondary | ICD-10-CM | POA: Diagnosis not present

## 2017-02-10 DIAGNOSIS — Z952 Presence of prosthetic heart valve: Secondary | ICD-10-CM

## 2017-02-10 LAB — POCT INR: INR: 2.3

## 2017-03-04 ENCOUNTER — Ambulatory Visit (INDEPENDENT_AMBULATORY_CARE_PROVIDER_SITE_OTHER): Payer: 59

## 2017-03-04 ENCOUNTER — Encounter (INDEPENDENT_AMBULATORY_CARE_PROVIDER_SITE_OTHER): Payer: Self-pay

## 2017-03-04 DIAGNOSIS — Z952 Presence of prosthetic heart valve: Secondary | ICD-10-CM

## 2017-03-04 DIAGNOSIS — Z7901 Long term (current) use of anticoagulants: Secondary | ICD-10-CM

## 2017-03-04 LAB — POCT INR: INR: 2.5

## 2017-04-01 ENCOUNTER — Ambulatory Visit (INDEPENDENT_AMBULATORY_CARE_PROVIDER_SITE_OTHER): Payer: 59 | Admitting: *Deleted

## 2017-04-01 ENCOUNTER — Encounter (INDEPENDENT_AMBULATORY_CARE_PROVIDER_SITE_OTHER): Payer: Self-pay

## 2017-04-01 DIAGNOSIS — Z952 Presence of prosthetic heart valve: Secondary | ICD-10-CM | POA: Diagnosis not present

## 2017-04-01 DIAGNOSIS — Z5181 Encounter for therapeutic drug level monitoring: Secondary | ICD-10-CM | POA: Insufficient documentation

## 2017-04-01 DIAGNOSIS — Z7901 Long term (current) use of anticoagulants: Secondary | ICD-10-CM

## 2017-04-01 LAB — POCT INR: INR: 2

## 2017-04-02 ENCOUNTER — Other Ambulatory Visit: Payer: Self-pay | Admitting: Cardiology

## 2017-04-02 ENCOUNTER — Other Ambulatory Visit: Payer: Self-pay | Admitting: Internal Medicine

## 2017-04-02 DIAGNOSIS — M810 Age-related osteoporosis without current pathological fracture: Secondary | ICD-10-CM

## 2017-04-21 ENCOUNTER — Other Ambulatory Visit: Payer: 59

## 2017-04-21 ENCOUNTER — Ambulatory Visit: Payer: 59

## 2017-05-04 ENCOUNTER — Ambulatory Visit (INDEPENDENT_AMBULATORY_CARE_PROVIDER_SITE_OTHER): Payer: 59 | Admitting: Internal Medicine

## 2017-05-04 ENCOUNTER — Encounter: Payer: Self-pay | Admitting: Internal Medicine

## 2017-05-04 DIAGNOSIS — B182 Chronic viral hepatitis C: Secondary | ICD-10-CM | POA: Insufficient documentation

## 2017-05-04 NOTE — Progress Notes (Signed)
Megan  I'll forward this to you since the patient comes into the [facility] clinic.  In the past I had one patient on Harvoni and we did find that it altered his INR.  Haven't seen anyone on Epclusa, although if we're treating the liver one would expect some alteration of INR levels.

## 2017-05-04 NOTE — Patient Instructions (Signed)
Date 05/04/17  Dear Ms Remsen, As discussed in the [facility], your hepatitis C therapy will include highly effective medication(s) for treatment and will vary based on the type of hepatitis C and insurance approval.  Potential medications include:          Harvoni (sofosbuvir 90mg /ledipasvir 400mg ) tablet oral daily          OR     Epclusa (sofosbuvir 400mg /velpatasvir 100mg ) tablet oral daily          OR      Mavyret (glecaprevir 100 mg/pibrentasvir 40 mg): Take 3 tablets oral daily          OR     Zepatier (elbasvir 50 mg/grazoprevir 100 mg) oral daily, +/- ribavirin              Medications are typically for 8 or 12 weeks total ---------------------------------------------------------------- Your HCV Treatment Start Date: You will be notified by our office once the medication is approved and where you can pick it up (or if mailed)   ---------------------------------------------------------------- [place]:   [facility] [place], [place] [address] Phone: [phone] Hours: Monday to Friday 7:30 am to 6:00 pm   Please always contact your pharmacy at least 3-4 business days before you run out of medications to ensure your next month's medication is ready or 1 week prior to running out if you receive it by mail.  Remember, each prescription is for 28 days. ---------------------------------------------------------------- GENERAL NOTES REGARDING YOUR HEPATITIS C MEDICATION:  Some medications have the following interactions:  - Acid reducing agents such as H2 blockers (ie. Pepcid (famotidine), Zantac (ranitidine), Tagamet (cimetidine), Axid (nizatidine) and proton pump inhibitors (ie. Prilosec (omeprazole), Protonix (pantoprazole), Nexium (esomeprazole), or Aciphex (rabeprazole)). Do not take until you have discussed with a health care provider.    -Antacids that contain magnesium and/or aluminum hydroxide (ie. Milk of Magensia, Rolaids,  Gaviscon, Maalox, Mylanta, an dArthritis Pain Formula).  -Calcium carbonate (calcium supplements or antacids such as Tums, Caltrate, Os-Cal).  -St. John's wort or any products that contain St. John's wort like some herbal supplements  Please inform the office prior to starting any of these medications.  - The common side effects associated with Harvoni include:      1. Fatigue      2. Headache      3. Nausea      4. Diarrhea      5. Insomnia  Please note that this only lists the most common side effects and is NOT a comprehensive list of the potential side effects of these medications. For more information, please review the drug information sheets that come with your medication package from the pharmacy.  ---------------------------------------------------------------- GENERAL HELPFUL HINTS ON HCV THERAPY: 1. Stay well-hydrated. 2. Notify the [facility] of any changes in your other over-the-counter/herbal or prescription medications. 3. If you miss a dose of your medication, take the missed dose as soon as you remember. Return to your regular time/dose schedule the next day.  4.  Do not stop taking your medications without first talking with your healthcare provider. 5.  You may take Tylenol (acetaminophen), as long as the dose is less than 2000 mg (OR no more than 4 tablets of the Tylenol Extra Strengths 500mg  tablet) in 24 hours. 6.  You will see our pharmacist-specialist within the first 2 weeks of starting your medication to monitor for any possible side effects. 7.  You will have labs once during treatment, soon  after treatment completion and one final lab 6 months after treatment completion to verify the virus is out of your system.  Scharlene Gloss, [place] for [place], [place]  [address] [phone]

## 2017-05-04 NOTE — Progress Notes (Signed)
[place] for Infectious Disease   CC: consideration for treatment for chronic hepatitis C  HPI:  +Elizabeth Morton is a 61 y.o. female who presents for initial evaluation and management of chronic hepatitis C.  Patient tested positive last month during routine screening. Hepatitis C-associated risk factors present are: none. Patient denies history of blood transfusion, intranasal drug use, IV drug abuse, sexual contact with person with liver disease, tattoos. Patient has had other studies performed. Results: hepatitis C RNA by PCR, result: positive. Patient has not had prior treatment for Hepatitis C. Patient does not have a past history of liver disease. Patient does not have a family history of liver disease. Patient does not  have associated signs or symptoms related to liver disease.  Labs reviewed and confirm chronic hepatitis C with a positive viral load.   Records reviewed from Epic, she is on coumadin for valve replacement and followed regularly.       Patient does not have documented immunity to Hepatitis A. Patient does not have documented immunity to Hepatitis B.    Review of Systems:  Constitutional: negative for fatigue and malaise Gastrointestinal: negative for diarrhea Integument/breast: negative for rash All other systems reviewed and are negative       Past Medical History:  Diagnosis Date  . Aortic stenosis    Aortic valve replacement 2006  . CAD (coronary artery disease)    a. s/p CABG 2006 at time of AVR.  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 Dyslipidemia   . Gallstones   . GERD (gastroesophageal reflux disease)   . Heart murmur   . Hx of CABG    2006,LIMA to LAD, SVG to diagonal  . Hx of transfusion of packed red blood cells   . Hypertension   . Hypothyroidism   . Iron deficiency anemia   . Lower GI bleed 06/2015  . Pericardial effusion    Insignificant small pericardial effusion seen on echo, November,  2013  . Peripheral vascular disease ([place]) 06   blood clot -stroke  . S/P aortic valve replacement    a. Bentall procedure,#21 St. Jude mechanical valve conduit with reimplantation of the coronaries 2006  . Status post colonoscopy with polypectomy    "bleeding; colonoscopy was ~ 06/18/2015"  . Stroke [facility]) 2006   no deficits   . Warfarin anticoagulation    coumadin therapy    Prior to Admission medications   Medication Sig Start Date End Date Taking? Authorizing Provider  acetaminophen (TYLENOL) 500 MG tablet Take 500-1,000 mg by mouth every 6 (six) hours as needed (for pain).    [provider]  Calcium Carbonate-Vitamin D (CALCIUM 600+D) 600-400 MG-UNIT per tablet Take 1 tablet by mouth 2 (two) times daily.    [provider]  furosemide (LASIX) 40 MG tablet Take 1 tablet (40 mg total) by mouth daily. 06/08/16   Orson Eva, MD  levothyroxine (SYNTHROID, LEVOTHROID) 50 MCG tablet Take 50 mcg by mouth daily before breakfast.    [provider]  omeprazole (PRILOSEC) 20 MG capsule Take 20 mg by mouth daily.      [provider]  potassium chloride (MICRO-K) 10 MEQ CR capsule TAKE 1 TABLET BY MOUTH EVERY DAY 05/02/14   Carlena Bjornstad, MD  simvastatin (ZOCOR) 40 MG tablet Take 1 tablet (40 mg total) by mouth at bedtime. 07/14/13   Carlena Bjornstad, MD  triazolam (HALCION) 0.25 MG tablet Take 1  tablet by mouth at bedtime. 08/12/16   [provider]  warfarin (COUMADIN) 3 MG tablet TAKE 1 TABLET (3 MG TOTAL) BY MOUTH AS DIRECTED. 04/02/17   Dorothy Spark, MD    No Known Allergies  Social History  Substance Use Topics  . Smoking status: Former Smoker    Packs/day: 1.00    Years: 25.00    Types: Cigarettes    Quit date: 07/15/2004  . Smokeless tobacco: Never Used  . Alcohol use No    Family History  Problem Relation Age of Onset  . Stroke Mother   . Heart disease Mother   . Alcohol abuse Father   . Hypertension Father   . Stroke Father     . Hyperlipidemia Father   . Stroke Brother        she has three and at least one of them has had a stroke  . Colon cancer Neg Hx   no cirrhosis   Objective:  Constitutional: in no apparent distress,  Vitals:   05/04/17 1347  Weight: 154 lb (69.9 kg)  Height: 5\' 2"  (1.575 m)   Eyes: anicteric Cardiovascular: Cor RRR Respiratory: CTA B; normal respiratory effort Gastrointestinal: Bowel sounds are normal, liver is not enlarged, spleen is not enlarged Musculoskeletal: no pedal edema noted Skin: negatives: no rash; no porphyria cutanea tarda Lymphatic: no cervical lymphadenopathy   Laboratory Genotype: No results found for: HCVGENOTYPE HCV viral load: No results found for: HCVQUANT Lab Results  Component Value Date   WBC 5.3 06/07/2016   HGB 11.5 (L) 06/07/2016   HCT 35.9 (L) 06/07/2016   MCV 91.6 06/07/2016   PLT 381 06/07/2016    Lab Results  Component Value Date   CREATININE 1.03 (H) 06/07/2016   BUN 12 06/07/2016   NA 135 06/07/2016   K 4.4 06/07/2016   CL 104 06/07/2016   CO2 21 (L) 06/07/2016    Lab Results  Component Value Date   ALT 16 05/24/2016   AST 44 (H) 05/24/2016   ALKPHOS 74 05/24/2016     Labs and history reviewed and show CHILD-PUGH unknown  5-6 points: Child class A 7-9 points: Child class B 10-15 points: Child class C  Lab Results  Component Value Date   INR 2.0 04/01/2017   BILITOT 1.1 05/24/2016   ALBUMIN 3.1 (L) 05/24/2016     Assessment: New Patient with Chronic Hepatitis C genotype unknown, untreated.  I discussed with the patient the lab findings that confirm chronic hepatitis C as well as the natural history and progression of disease including about 30% of people who develop cirrhosis of the liver if left untreated and once cirrhosis is established there is a 2-7% risk per year of liver cancer and liver failure.  I discussed the importance of treatment and benefits in reducing the risk, even if significant liver fibrosis exists.    Plan: 1) Patient counseled extensively on limiting acetaminophen to no more than 2 grams daily, avoidance of alcohol. 2) Transmission discussed with patient including sexual transmission, sharing razors and toothbrush.   3) Will need referral to gastroenterology if concern for cirrhosis 4) Will need referral for substance abuse counseling: No.; Further work up to include urine drug screen  No. 5) Will prescribe appropriate medication based on genotype and coverage  6) Hepatitis A and B titers 7) Pneumovax vaccine given by PCP 9) Further work up to include liver staging with elastography - she wants to defer this for now 10) will follow up after  starting medication

## 2017-05-05 LAB — COMPLETE METABOLIC PANEL WITH GFR
AG Ratio: 1.5 (calc) (ref 1.0–2.5)
ALKALINE PHOSPHATASE (APISO): 105 U/L (ref 33–130)
ALT: 22 U/L (ref 6–29)
AST: 24 U/L (ref 10–35)
Albumin: 4.4 g/dL (ref 3.6–5.1)
BILIRUBIN TOTAL: 0.5 mg/dL (ref 0.2–1.2)
BUN/Creatinine Ratio: 13 (calc) (ref 6–22)
BUN: 14 mg/dL (ref 7–25)
CHLORIDE: 102 mmol/L (ref 98–110)
CO2: 29 mmol/L (ref 20–32)
Calcium: 10.1 mg/dL (ref 8.6–10.4)
Creat: 1.08 mg/dL — ABNORMAL HIGH (ref 0.50–1.05)
GFR, Est African American: 65 mL/min/{1.73_m2} (ref >=60)
GFR, Est Non African American: 56 mL/min/{1.73_m2} — ABNORMAL LOW (ref >=60)
GLUCOSE: 107 mg/dL — AB (ref 65–99)
Globulin: 2.9 g/dL (calc) (ref 1.9–3.7)
Potassium: 4 mmol/L (ref 3.5–5.3)
Sodium: 140 mmol/L (ref 135–146)
Total Protein: 7.3 g/dL (ref 6.1–8.1)

## 2017-05-05 LAB — HEPATITIS B SURFACE ANTIBODY,QUALITATIVE: HEP B S AB: NONREACTIVE

## 2017-05-05 LAB — HEPATITIS B SURFACE ANTIGEN: HEP B S AG: NONREACTIVE

## 2017-05-05 LAB — HIV ANTIBODY (ROUTINE TESTING W REFLEX): HIV: NONREACTIVE

## 2017-05-05 LAB — HEPATITIS A ANTIBODY, TOTAL: Hepatitis A AB,Total: REACTIVE — AB

## 2017-05-05 LAB — HEPATITIS B CORE ANTIBODY, TOTAL: Hep B Core Total Ab: NONREACTIVE

## 2017-05-06 ENCOUNTER — Ambulatory Visit (INDEPENDENT_AMBULATORY_CARE_PROVIDER_SITE_OTHER): Payer: 59 | Admitting: Pharmacist

## 2017-05-06 DIAGNOSIS — Z7901 Long term (current) use of anticoagulants: Secondary | ICD-10-CM

## 2017-05-06 DIAGNOSIS — Z5181 Encounter for therapeutic drug level monitoring: Secondary | ICD-10-CM

## 2017-05-06 DIAGNOSIS — Z952 Presence of prosthetic heart valve: Secondary | ICD-10-CM | POA: Diagnosis not present

## 2017-05-06 LAB — POCT INR: INR: 2

## 2017-05-07 LAB — LIVER FIBROSIS, FIBROTEST-ACTITEST
ALPHA-2-MACROGLOBULIN: 220 mg/dL (ref 106–279)
ALT: 20 U/L (ref 6–29)
Apolipoprotein A1: 175 mg/dL (ref 101–198)
BILIRUBIN: 0.3 mg/dL (ref 0.2–1.2)
Fibrosis Score: 0.45
GGT: 27 U/L (ref 3–70)
NECROINFLAMMAT ACT SCORE: 0.1
Reference ID: 2176022

## 2017-05-08 LAB — HEPATITIS C GENOTYPE

## 2017-05-09 ENCOUNTER — Other Ambulatory Visit: Payer: Self-pay | Admitting: Internal Medicine

## 2017-05-09 MED ORDER — LEDIPASVIR-SOFOSBUVIR 90-400 MG PO TABS: 1.0000 | ORAL_TABLET | Freq: Every day | ORAL | 2 refills | Status: DC

## 2017-05-10 ENCOUNTER — Ambulatory Visit (HOSPITAL_COMMUNITY): Payer: 59

## 2017-05-18 ENCOUNTER — Other Ambulatory Visit: Payer: Self-pay | Admitting: Pharmacist

## 2017-05-18 ENCOUNTER — Telehealth: Payer: Self-pay | Admitting: Pharmacist

## 2017-05-18 MED ORDER — ONDANSETRON 4 MG PO TBDP: 4.0000 mg | ORAL_TABLET | Freq: Three times a day (TID) | ORAL | 1 refills | Status: DC | PRN

## 2017-05-18 MED FILL — HARVONI 90-400 MG TABLET: 90-400 | 28 days supply | Qty: 28 | Fill #0

## 2017-05-18 NOTE — Telephone Encounter (Signed)
thanks

## 2017-05-18 NOTE — Telephone Encounter (Signed)
-----   Message from Orpah Melter, CPhT sent at 05/18/2017  3:11 PM EST ----- Per Dr. Henreitta Leber request, I am messaging you both that Elizabeth Morton has been approved for 8 weeks of Harvoni (denied for 12 wks).  She is aware and Cassie spoke with her.  For coumadin monitoring.   ----- Message ----- From: Thayer Headings, MD Sent: 05/09/2017   1:55 PM To: Orpah Melter, CPhT  Sent in [place]. Inez Catalina - Can you let Elizabeth Morton and Elizabeth Morton know when she is approved and starting - they are monitoring her coumadin and will monitor more closely.  Thanks Rob

## 2017-05-18 NOTE — Telephone Encounter (Signed)
Pt currently scheduled for next INR check in 1 month. Called pt to move up INR check ~2 weeks after she starts Harvoni due to expected improvement in liver function. Expected start date is 11/9 since she has not received her shipment yet). Pt states she is following up in ID clinic on 12/5 and requests that her INR be checked that day as well since she lives out of town. Appt has been made.

## 2017-05-18 NOTE — Telephone Encounter (Signed)
Patient has been approved for Harvoni to treat her Hep C infection.  Spoke to patient and counseled her on how to take Harvoni - one tablet PO daily. She is also on warfarin and omeprazole.  She is taking omeprazole every day and feels like she needs it.  After some discussion, we decided she would stop the omeprazole and anything else for acid reflux while on Harvoni and call me if it bothers her significantly to come up with a plan.  Also told her that the warfarin clinic would have to monitor her INR close on this medication.  Elizabeth Morton will ship out the medication to her tomorrow, and she will call our clinic as well as her warfarin clinic when she receives medicine and starts it. Also went through her other medications and she requested something for nausea.  Will send in some Zofran to [facility] in case she needs it.

## 2017-05-20 ENCOUNTER — Ambulatory Visit
Admission: RE | Admit: 2017-05-20 | Discharge: 2017-05-20 | Disposition: A | Discharge status: Home / Self Care | Payer: 59 | Source: Ambulatory Visit | Attending: Internal Medicine | Admitting: Internal Medicine

## 2017-05-20 DIAGNOSIS — B182 Chronic viral hepatitis C: Secondary | ICD-10-CM

## 2017-05-21 ENCOUNTER — Telehealth: Payer: Self-pay | Admitting: Pharmacist

## 2017-05-21 NOTE — Telephone Encounter (Signed)
Elizabeth Morton called this afternoon. She started her Harvoni today. She stopped taking her omeprazole 20 mg. I talked to her about what to do if she needs to go back on acid suppressive therapy. Recommended that she could either take the omeprazole at the same time as the Harvoni on an empty stomach or do Tums 4 hours separate from the medications. Asked her to please call us if she needs to re-start acid suppression.

## 2017-05-24 ENCOUNTER — Encounter: Payer: Self-pay | Admitting: Pharmacy Technician

## 2017-06-14 MED FILL — HARVONI 90-400 MG TABLET: 90-400 | 28 days supply | Qty: 28 | Fill #1

## 2017-06-15 ENCOUNTER — Ambulatory Visit
Admission: RE | Admit: 2017-06-15 | Discharge: 2017-06-15 | Disposition: A | Discharge status: Home / Self Care | Payer: 59 | Source: Ambulatory Visit | Attending: Internal Medicine | Admitting: Internal Medicine

## 2017-06-15 DIAGNOSIS — M810 Age-related osteoporosis without current pathological fracture: Secondary | ICD-10-CM

## 2017-06-15 DIAGNOSIS — Z1231 Encounter for screening mammogram for malignant neoplasm of breast: Secondary | ICD-10-CM

## 2017-06-16 ENCOUNTER — Ambulatory Visit (INDEPENDENT_AMBULATORY_CARE_PROVIDER_SITE_OTHER): Payer: 59 | Admitting: Pharmacist

## 2017-06-16 ENCOUNTER — Ambulatory Visit (INDEPENDENT_AMBULATORY_CARE_PROVIDER_SITE_OTHER): Payer: 59 | Admitting: *Deleted

## 2017-06-16 DIAGNOSIS — B182 Chronic viral hepatitis C: Secondary | ICD-10-CM | POA: Diagnosis not present

## 2017-06-16 DIAGNOSIS — Z5181 Encounter for therapeutic drug level monitoring: Secondary | ICD-10-CM

## 2017-06-16 DIAGNOSIS — Z952 Presence of prosthetic heart valve: Secondary | ICD-10-CM

## 2017-06-16 DIAGNOSIS — Z7901 Long term (current) use of anticoagulants: Secondary | ICD-10-CM

## 2017-06-16 LAB — POCT INR: INR: 2.6

## 2017-06-16 NOTE — Progress Notes (Signed)
HPI: Elizabeth Morton is a 61 y.o. female who presents to the [facility] clinic for Hep C follow-up.  She has genotype 1a, F0/F1, and started 8 weeks of Harvoni on 11/9.  Lab Results  Component Value Date   HCVGENOTYPE 1a 05/04/2017    Allergies: No Known Allergies  Past Medical History: Past Medical History:  Diagnosis Date  . Aortic stenosis    Aortic valve replacement 2006  . CAD (coronary artery disease)    a. s/p CABG 2006 at time of AVR.  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 Dyslipidemia   . Gallstones   . GERD (gastroesophageal reflux disease)   . Heart murmur   . Hx of CABG    2006,LIMA to LAD, SVG to diagonal  . Hx of transfusion of packed red blood cells   . Hypertension   . Hypothyroidism   . Iron deficiency anemia   . Lower GI bleed 06/2015  . Pericardial effusion    Insignificant small pericardial effusion seen on echo, November, 2013  . Peripheral vascular disease ([place]) 06   blood clot -stroke  . S/P aortic valve replacement    a. Bentall procedure,#21 St. Jude mechanical valve conduit with reimplantation of the coronaries 2006  . Status post colonoscopy with polypectomy    "bleeding; colonoscopy was ~ 06/18/2015"  . Stroke [facility]) 2006   no deficits   . Warfarin anticoagulation    coumadin therapy    Social History: Social History   Socioeconomic History  . Marital status: Married    Spouse name: Not on file  . Number of children: 1  . Years of education: Not on file  . Highest education level: Not on file  Social Needs  . Financial resource strain: Not on file  . Food insecurity - worry: Not on file  . Food insecurity - inability: Not on file  . Transportation needs - medical: Not on file  . Transportation needs - non-medical: Not on file  Occupational History  . Occupation: homemaker  Tobacco Use  . Smoking status: Former Smoker    Packs/day: 1.00    Years: 25.00    Pack years: 25.00    Types:  Cigarettes    Last attempt to quit: 07/15/2004    Years since quitting: 12.9  . Smokeless tobacco: Never Used  Substance and Sexual Activity  . Alcohol use: No  . Drug use: No  . Sexual activity: No  Other Topics Concern  . Not on file  Social History Narrative   She lives in [place] with her husband and son. She previously smoked 37-pack-years,but quit following her stroke in April of 2006. She denies any alcohol or drugs. She has not been routinely exercising.    Labs: Hep B S Ab (no units)  Date Value  05/04/2017 NON-REACTIVE   Hepatitis B Surface Ag (no units)  Date Value  05/04/2017 NON-REACTIVE    Lab Results  Component Value Date   HCVGENOTYPE 1a 05/04/2017    No flowsheet data found.  AST (U/L)  Date Value  05/04/2017 24  05/24/2016 44 (H)  05/16/2016 45 (H)   ALT (U/L)  Date Value  05/04/2017 22  05/04/2017 20  05/24/2016 16  05/16/2016 29   POC INR (no units)  Date Value  09/26/2010 3.0  09/12/2010 4.6  08/22/2010 4.4   INR (no units)  Date Value  06/16/2017 2.6  05/06/2017 2.0  04/01/2017 2.0  06/07/2016 2.19  06/06/2016 1.96  06/05/2016 1.74    CrCl: CrCl cannot be calculated (Patient's most recent lab result is older than the maximum 21 days allowed.).  Fibrosis Score: F0/F1 as assessed by ARFI   Previous Treatment Regimen: None  Assessment: Elizabeth Morton is here today to follow-up for her Hep C infection. She started 8 weeks of Harvoni ~11/9. She just received her 2nd bottle from [facility]. She is doing quite well on the medication - no side effects and no missed doses.  She is feeling a little fatigued but is relating that to the holidays. She just came from the Coumadin clinic across the road and her INR was within the range of 2-3 at 2.6. No issues there.  She is getting some acid reflux but has not taken her omeprazole.  She is taking Tums some days and is separating it from her Harvoni by at least 4 hours when she does take it.  She  feels she can get by with this for the remainder of her time on Harvoni.  I will get a Hep C viral load and see her back after the new year for her EOT visit.   She has already had her flu shot this year.  She does not have any documented immunity to Hepatitis B, but states her PCP, Dr. Nyoka Cowden, keeps up with her immunizations and she will have him check/give her the vaccination series.     Plans: - Continue Harvoni x 8 weeks - Separate Tums by 4 hours from [place] - HCV RNA today - F/u with me again 07/28/17 at 130pm  Elizabeth Morton L. Rokia Bosket, PharmD, [place], [place] for Infectious Disease 06/16/2017, 2:13 PM

## 2017-06-16 NOTE — Patient Instructions (Signed)
Continue taking 1 tablet daily except 1.5 tablets each Friday.  Recheck INR in 6 weeks.Has been taking Harvoni times 1 month  Call coumadin clinic with any new medications scheduled for any procedures or with any questions [phone].

## 2017-06-18 LAB — HEPATITIS C RNA QUANTITATIVE
HCV Quantitative Log: <1.18 Log IU/mL
HCV RNA, PCR, QN: NOT DETECTED [IU]/mL

## 2017-07-28 ENCOUNTER — Ambulatory Visit (INDEPENDENT_AMBULATORY_CARE_PROVIDER_SITE_OTHER): Payer: 59 | Admitting: *Deleted

## 2017-07-28 ENCOUNTER — Ambulatory Visit (INDEPENDENT_AMBULATORY_CARE_PROVIDER_SITE_OTHER): Payer: 59 | Admitting: Pharmacist

## 2017-07-28 DIAGNOSIS — Z951 Presence of aortocoronary bypass graft: Secondary | ICD-10-CM | POA: Diagnosis not present

## 2017-07-28 DIAGNOSIS — B182 Chronic viral hepatitis C: Secondary | ICD-10-CM

## 2017-07-28 DIAGNOSIS — Z5181 Encounter for therapeutic drug level monitoring: Secondary | ICD-10-CM | POA: Diagnosis not present

## 2017-07-28 DIAGNOSIS — Z952 Presence of prosthetic heart valve: Secondary | ICD-10-CM

## 2017-07-28 DIAGNOSIS — Z7901 Long term (current) use of anticoagulants: Secondary | ICD-10-CM | POA: Diagnosis not present

## 2017-07-28 LAB — POCT INR: INR: 1.8

## 2017-07-28 NOTE — Progress Notes (Signed)
[place] for Infectious Disease Pharmacy Visit  HPI: Elizabeth Morton is a 62 y.o. female who presents to the [facility] clinic for Hep C follow-up.  She has genotype 1a, F0/F1, and started 8 weeks of Harvoni ~11/9.  Patient Active Problem List   Diagnosis Date Noted  . Chronic hepatitis C without hepatic coma ([place]) 05/04/2017  . Encounter for therapeutic drug monitoring 04/01/2017  . Rectus sheath hematoma, subsequent encounter 06/03/2016  . Pulmonary edema   . Bleeding   . Abdominal distension   . Acute on chronic diastolic congestive heart failure ([place]) 05/26/2016  . Hypotension   . Acute respiratory failure ([place])   . Rectus sheath hematoma, initial encounter 05/24/2016  . S/P repair of ventral hernia Nov 2017 05/15/2016  . Ventral hernia 05/15/2016  . Lower GI bleed   . Hypokalemia 06/26/2015  . Acute GI bleeding 06/25/2015  . Chronic congestive heart failure with left ventricular diastolic dysfunction ([place]) 06/25/2015  . Hypothyroidism 06/25/2015  . Acute blood loss anemia 06/25/2015  . Long term (current) use of anticoagulants 06/02/2012  . Preop cardiovascular exam   . Pericardial effusion   . CAD (coronary artery disease)   . S/P aortic valve replacement   . Aortic stenosis   . Ejection fraction   . Ecchymosis   . Chronic cholecystitis with calculus 05/25/2012  . Constipation, chronic 05/25/2012  . Needs screening Colonoscopy - Pt refused 05/25/2012  . Warfarin anticoagulation   . Hypertension   . Hx of CABG   . CVA (cerebral infarction)   . Dyslipidemia   . Shortness of breath 04/16/2010    Patient's Medications  New Prescriptions   No medications on file  Previous Medications   ACETAMINOPHEN (TYLENOL) 500 MG TABLET    Take 500-1,000 mg by mouth every 6 (six) hours as needed (for pain).   CALCIUM CARBONATE-VITAMIN D (CALCIUM 600+D) 600-400 MG-UNIT PER TABLET    Take 1 tablet by mouth 2 (two) times daily.   FUROSEMIDE (LASIX) 20 MG TABLET    Take 20  mg by mouth daily.   LEVOTHYROXINE (SYNTHROID, LEVOTHROID) 50 MCG TABLET    Take 50 mcg by mouth daily before breakfast.   OMEPRAZOLE (PRILOSEC) 20 MG CAPSULE    Take 20 mg by mouth daily.     ONDANSETRON (ZOFRAN ODT) 4 MG DISINTEGRATING TABLET    Take 1 tablet (4 mg total) every 8 (eight) hours as needed by mouth for nausea or vomiting.   POTASSIUM CHLORIDE (MICRO-K) 10 MEQ CR CAPSULE    TAKE 1 TABLET BY MOUTH EVERY DAY   SIMVASTATIN (ZOCOR) 40 MG TABLET    Take 1 tablet (40 mg total) by mouth at bedtime.   TRIAZOLAM (HALCION) 0.25 MG TABLET    Take 1 tablet by mouth at bedtime.   WARFARIN (COUMADIN) 3 MG TABLET    TAKE 1 TABLET (3 MG TOTAL) BY MOUTH AS DIRECTED.  Modified Medications   No medications on file  Discontinued Medications   LEDIPASVIR-SOFOSBUVIR (HARVONI) 90-400 MG TABS    Take 1 tablet by mouth daily.    Allergies: No Known Allergies  Past Medical History: Past Medical History:  Diagnosis Date  . Aortic stenosis    Aortic valve replacement 2006  . CAD (coronary artery disease)    a. s/p CABG 2006 at time of AVR.  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 Dyslipidemia   . Gallstones   . GERD (gastroesophageal reflux disease)   .  Heart murmur   . Hx of CABG    2006,LIMA to LAD, SVG to diagonal  . Hx of transfusion of packed red blood cells   . Hypertension   . Hypothyroidism   . Iron deficiency anemia   . Lower GI bleed 06/2015  . Pericardial effusion    Insignificant small pericardial effusion seen on echo, November, 2013  . Peripheral vascular disease ([place]) 06   blood clot -stroke  . S/P aortic valve replacement    a. Bentall procedure,#21 St. Jude mechanical valve conduit with reimplantation of the coronaries 2006  . Status post colonoscopy with polypectomy    "bleeding; colonoscopy was ~ 06/18/2015"  . Stroke [facility]) 2006   no deficits   . Warfarin anticoagulation    coumadin therapy    Social History: Social History    Socioeconomic History  . Marital status: Married    Spouse name: Not on file  . Number of children: 1  . Years of education: Not on file  . Highest education level: Not on file  Social Needs  . Financial resource strain: Not on file  . Food insecurity - worry: Not on file  . Food insecurity - inability: Not on file  . Transportation needs - medical: Not on file  . Transportation needs - non-medical: Not on file  Occupational History  . Occupation: homemaker  Tobacco Use  . Smoking status: Former Smoker    Packs/day: 1.00    Years: 25.00    Pack years: 25.00    Types: Cigarettes    Last attempt to quit: 07/15/2004    Years since quitting: 13.0  . Smokeless tobacco: Never Used  Substance and Sexual Activity  . Alcohol use: No  . Drug use: No  . Sexual activity: No  Other Topics Concern  . Not on file  Social History Narrative   She lives in [place] with her husband and son. She previously smoked 37-pack-years,but quit following her stroke in April of 2006. She denies any alcohol or drugs. She has not been routinely exercising.    Labs: Hep B S Ab (no units)  Date Value  05/04/2017 NON-REACTIVE   Hepatitis B Surface Ag (no units)  Date Value  05/04/2017 NON-REACTIVE    Lab Results  Component Value Date   HCVGENOTYPE 1a 05/04/2017    Hepatitis C RNA quantitative Latest Ref Rng & Units 06/16/2017  HCV Quantitative Log NOT DETECT Log IU/mL <1.18 NOT DETECTED    AST (U/L)  Date Value  05/04/2017 24  05/24/2016 44 (H)  05/16/2016 45 (H)   ALT (U/L)  Date Value  05/04/2017 22  05/04/2017 20  05/24/2016 16  05/16/2016 29   POC INR (no units)  Date Value  09/26/2010 3.0  09/12/2010 4.6  08/22/2010 4.4   INR (no units)  Date Value  07/28/2017 1.8  06/16/2017 2.6  05/06/2017 2.0  06/07/2016 2.19  06/06/2016 1.96  06/05/2016 1.74    Fibrosis Score: F0/F1 as assessed by ARFI   Assessment: Elizabeth Morton is here today to follow-up for her Hep C  infection. She completed 8 weeks of Harvoni on 1/10.  She states she felt a little fatigued throughout the treatment course, but it is getting better since she completed therapy.  She went to the coumadin clinic today and her INR was a tad low, so she had her dose adjusted and will follow-up with them again in 2 weeks.  She did not miss any doses of her Harvoni. I explained her  fibrosis score to her again and the rest of the follow-up process.  Her early on treatment Hep C viral load on 12/5 was already undetectable.  I told her she had a very high chance for cure. I will get another Hep C viral load today and have her come back and see me in 3 months for her cure visit.   Plan: - Completed 8 weeks of Harvoni - Hep C viral load today - F/u with me again for cure visit 4/17 at 130pm  Milt Coye L. Lissete Maestas, PharmD, [place], [place] for Infectious Disease 07/28/2017, 2:31 PM

## 2017-07-28 NOTE — Patient Instructions (Signed)
Description   Today January 16th take 1 and 1/2 tablets (4.5mg )  of coumadin then continue taking 1 tablet daily except 1 and 1/2 tablets each Friday.  Recheck INR in 2 weeks.Finished  Harvoni last Thursday or Friday  Call coumadin clinic with any new medications scheduled for any procedures or with any questions [phone].

## 2017-07-30 LAB — HEPATITIS C RNA QUANTITATIVE
HCV Quantitative Log: <1.18 Log IU/mL
HCV RNA, PCR, QN: NOT DETECTED [IU]/mL

## 2017-08-02 ENCOUNTER — Other Ambulatory Visit: Payer: Self-pay | Admitting: Cardiology

## 2017-08-11 ENCOUNTER — Ambulatory Visit (INDEPENDENT_AMBULATORY_CARE_PROVIDER_SITE_OTHER): Payer: 59 | Admitting: *Deleted

## 2017-08-11 DIAGNOSIS — Z7901 Long term (current) use of anticoagulants: Secondary | ICD-10-CM

## 2017-08-11 DIAGNOSIS — Z952 Presence of prosthetic heart valve: Secondary | ICD-10-CM

## 2017-08-11 DIAGNOSIS — Z5181 Encounter for therapeutic drug level monitoring: Secondary | ICD-10-CM | POA: Diagnosis not present

## 2017-08-11 LAB — POCT INR: INR: 1.8

## 2017-08-11 NOTE — Patient Instructions (Signed)
Description   Today January 30th  take 1 and 1/2 tablets (4.5mg )  of coumadin then change dose of coumadin to 1 tablet daily except 1 and 1/2 tablets each Monday and  Friday.  Recheck INR in 2 weeks.but pt states she will come back in 3 weeks   Call coumadin clinic with any new medications scheduled for any procedures or with any questions [phone].

## 2017-09-01 ENCOUNTER — Ambulatory Visit (INDEPENDENT_AMBULATORY_CARE_PROVIDER_SITE_OTHER): Payer: 59 | Admitting: *Deleted

## 2017-09-01 DIAGNOSIS — Z5181 Encounter for therapeutic drug level monitoring: Secondary | ICD-10-CM

## 2017-09-01 DIAGNOSIS — Z7901 Long term (current) use of anticoagulants: Secondary | ICD-10-CM | POA: Diagnosis not present

## 2017-09-01 DIAGNOSIS — Z952 Presence of prosthetic heart valve: Secondary | ICD-10-CM | POA: Diagnosis not present

## 2017-09-01 LAB — POCT INR: INR: 3.5

## 2017-09-01 NOTE — Patient Instructions (Signed)
Description   Today Feb 20th do not take coumadin then continue same  dose of coumadin  1 tablet daily except 1 and 1/2 tablets each Monday and  Friday.  Recheck INR in 2 weeks.but pt states she will come back in 3 weeks   Call coumadin clinic with any new medications scheduled for any procedures or with any questions [phone].Do serving of dark leafy greens today and try to do 1 serving a week

## 2017-09-22 ENCOUNTER — Ambulatory Visit (INDEPENDENT_AMBULATORY_CARE_PROVIDER_SITE_OTHER): Payer: 59 | Admitting: *Deleted

## 2017-09-22 DIAGNOSIS — Z7901 Long term (current) use of anticoagulants: Secondary | ICD-10-CM | POA: Diagnosis not present

## 2017-09-22 DIAGNOSIS — Z952 Presence of prosthetic heart valve: Secondary | ICD-10-CM

## 2017-09-22 DIAGNOSIS — Z5181 Encounter for therapeutic drug level monitoring: Secondary | ICD-10-CM | POA: Diagnosis not present

## 2017-09-22 LAB — POCT INR: INR: 2.7

## 2017-09-22 NOTE — Patient Instructions (Signed)
Description   Continue same  dose of coumadin  1 tablet daily except 1 and 1/2 tablets each Monday and  Friday.  Recheck INR in 4 weeks.   Call coumadin clinic with any new medications scheduled for any procedures or with any questions [phone].Try to do 1 serving of dark leafy greens  a week

## 2017-09-27 IMAGING — CR DG CHEST 1V PORT
1 series · 1 of 1 positions shown · non-contrast
Comparison: 05/27/2016

CLINICAL DATA: Acute respiratory failure

EXAM:
PORTABLE CHEST 1 VIEW

[AP]
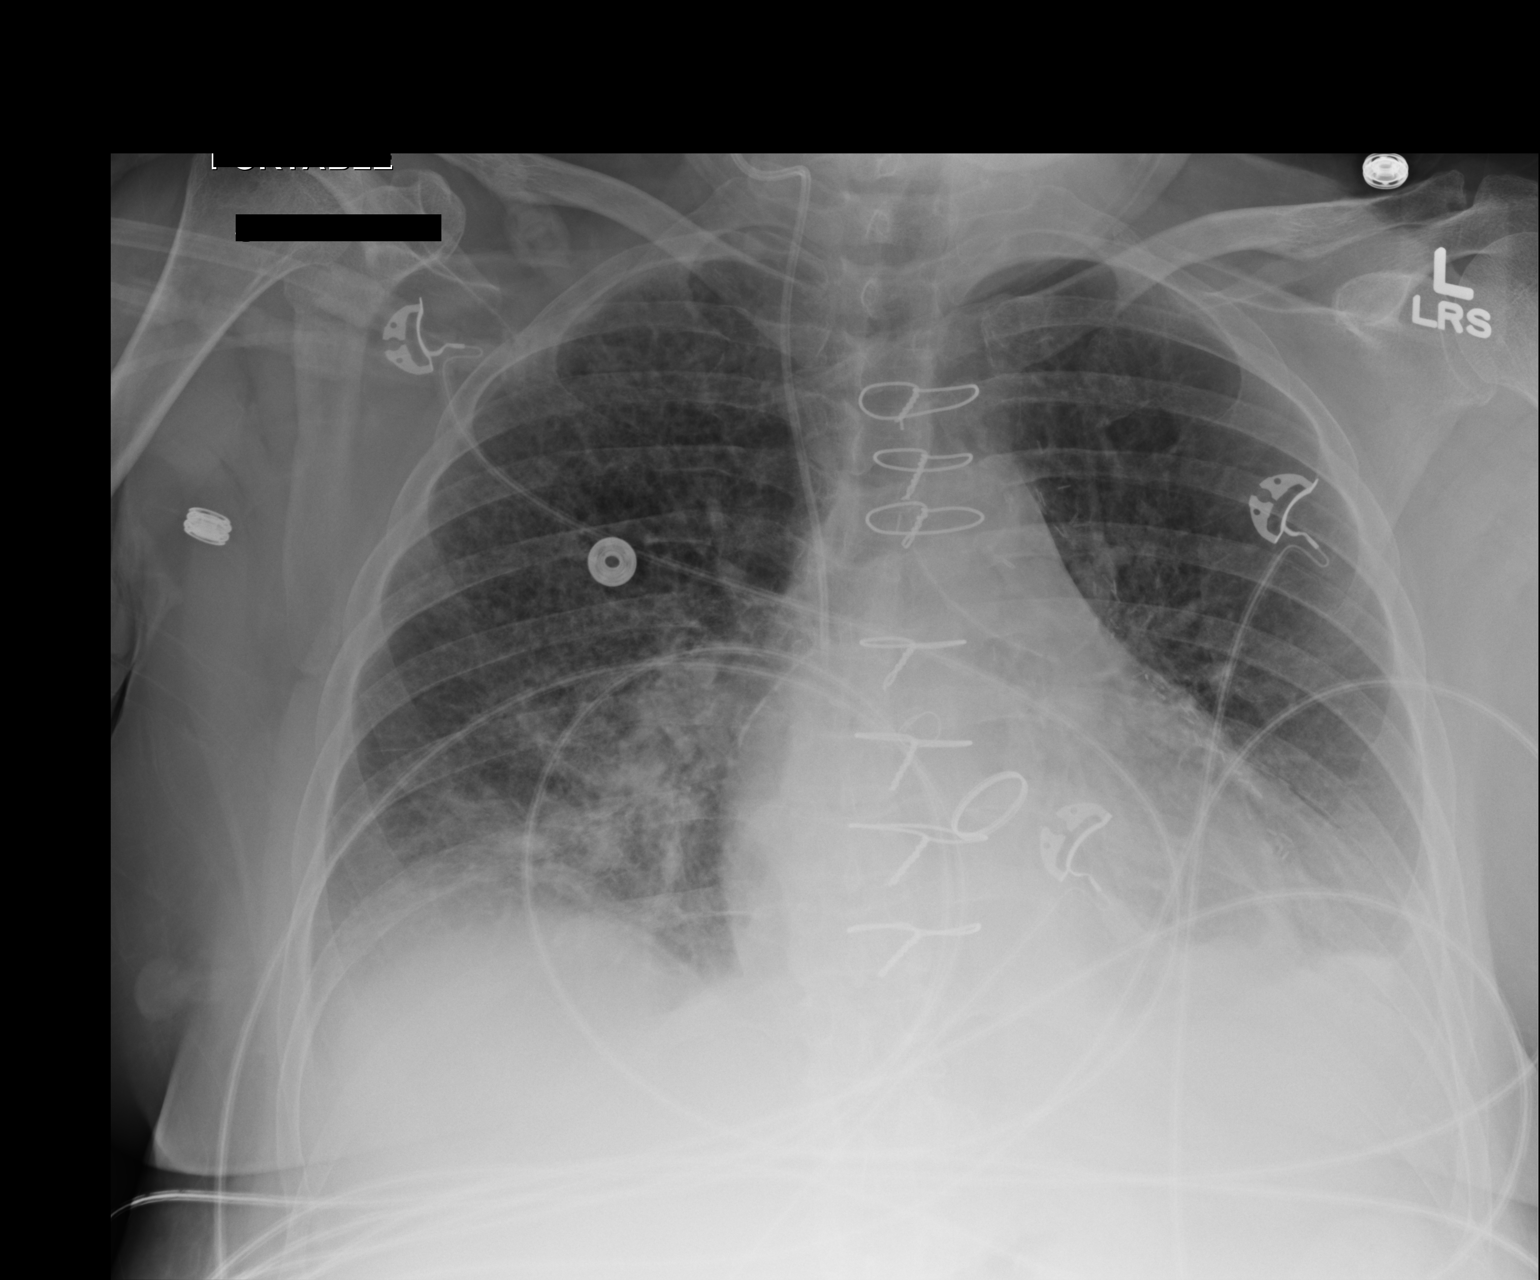

[1 of 1 positions shown; findings below may reference images not displayed]

FINDINGS: 3364 hours. Low volume film. The cardio pericardial silhouette is
enlarged. Pulmonary vascular congestion with interstitial and patchy
basilar a (right greater than left) airspace disease persists
without substantial change. Small bilateral pleural effusions noted.
The visualized bony structures of the thorax are intact. Telemetry
leads overlie the chest.
IMPRESSION: Cardiomegaly with vascular congestion and patchy bibasilar airspace
disease shows no substantial change.

Small bilateral pleural effusions.

## 2017-09-27 IMAGING — CT CT ABD-PELV W/O CM
2 of 4 series · 15 of 46 positions shown, 17 images · non-contrast
Comparison: 05/24/2016 CT abdomen/pelvis.

CLINICAL DATA: 60-year-old female inpatient status post umbilical
hernia repair 05/15/2016, complicated by large ventral
abdominopelvic wall hematoma, presenting for follow-up.

EXAM:
CT ABDOMEN AND PELVIS WITHOUT CONTRAST
TECHNIQUE: Multidetector CT imaging of the abdomen and pelvis was performed
following the standard protocol without IV contrast.

[Series 2: a/p w/o 5mm · axial · non-contrast · 0.76mm/px · z∈[+594,+1009]mm · 12 of 91 slices shown, 14 images]
[im 4/91  soft-tissue]
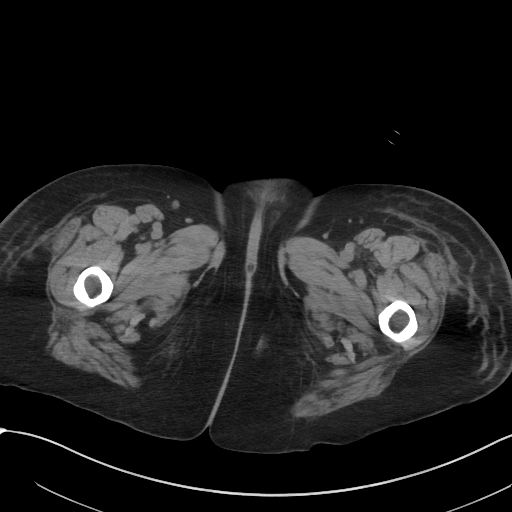
[im 4/91  bone]
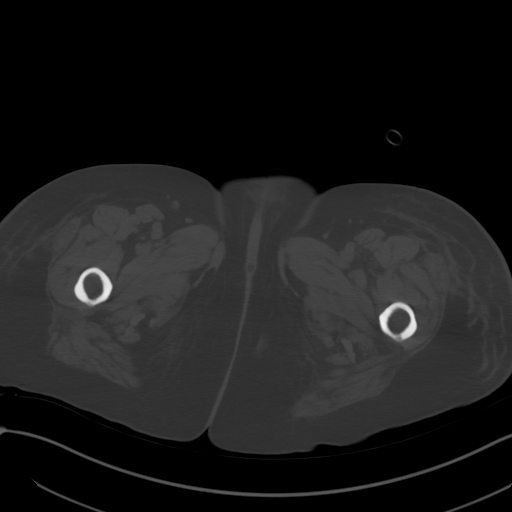
[im 12/91  soft-tissue]
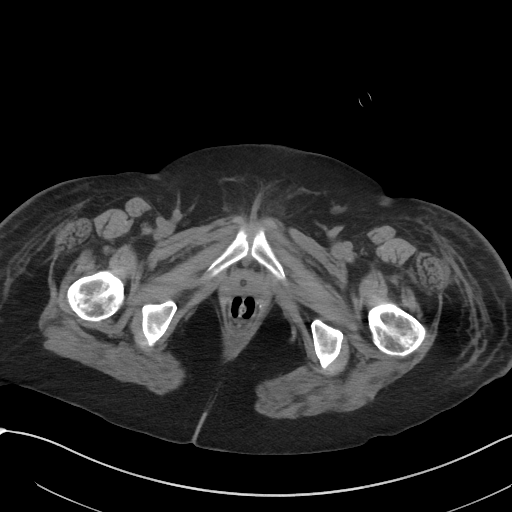
[im 20/91  soft-tissue]
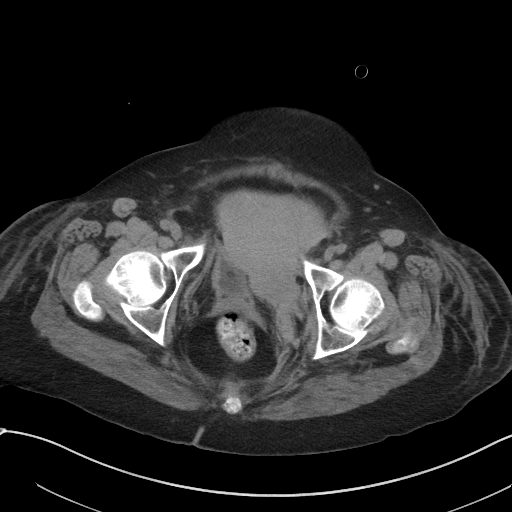
[im 28/91  soft-tissue]
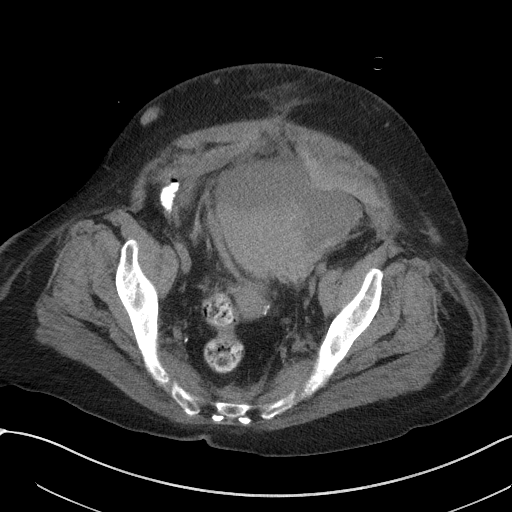
[im 36/91  soft-tissue]
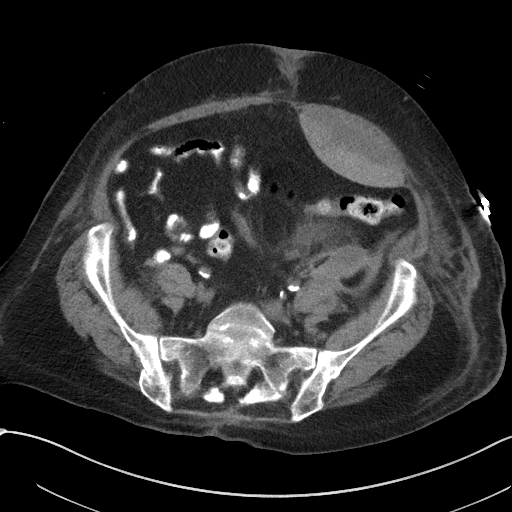
[im 44/91  soft-tissue]
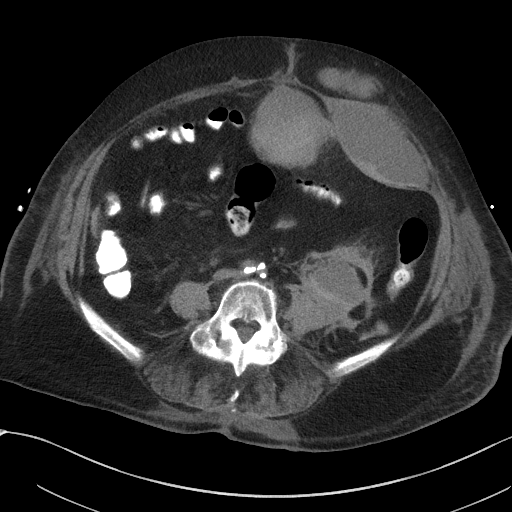
[im 47/91  soft-tissue]
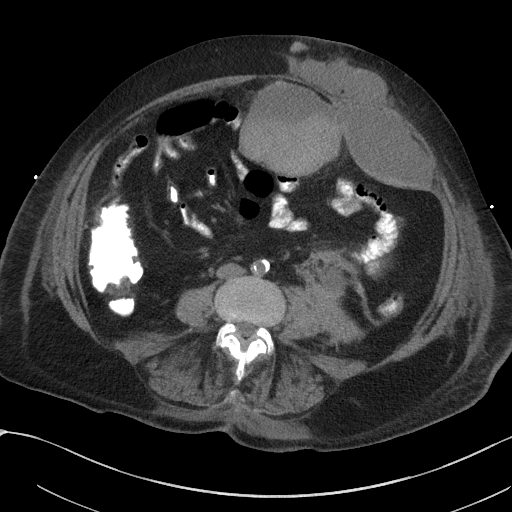
[im 55/91  soft-tissue]
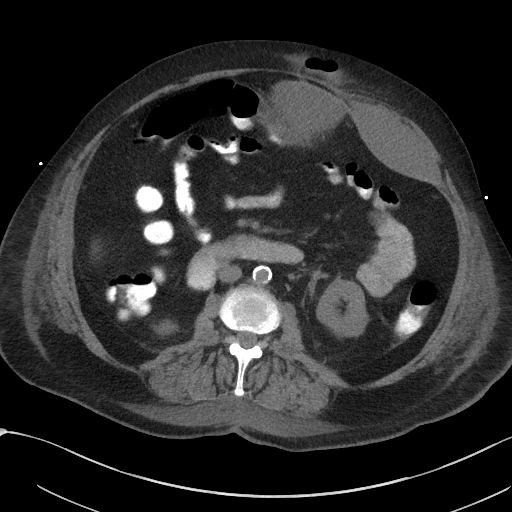
[im 63/91  soft-tissue]
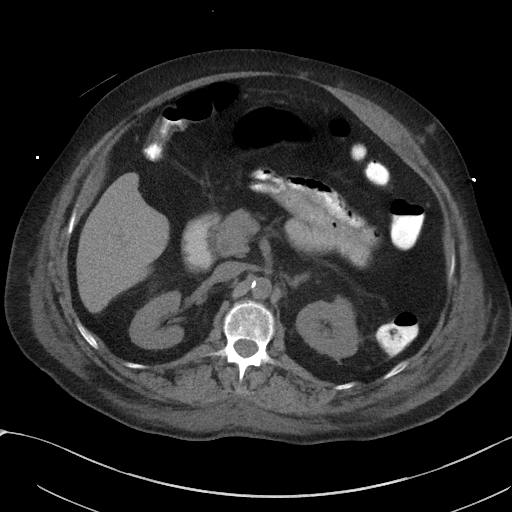
[im 63/91  bone]
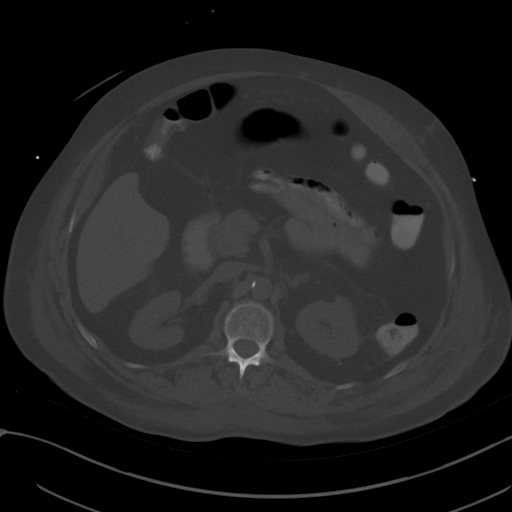
[im 71/91  soft-tissue]
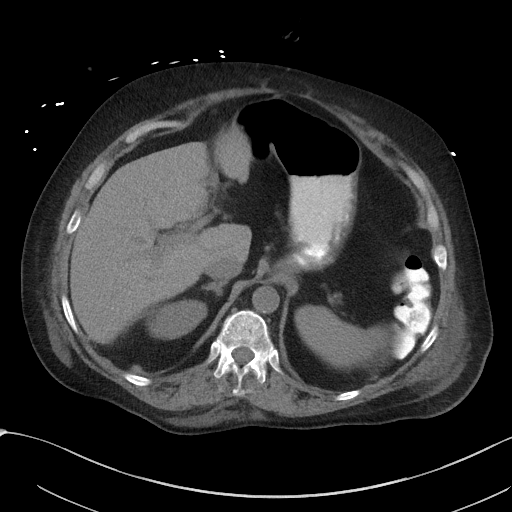
[im 79/91  soft-tissue]
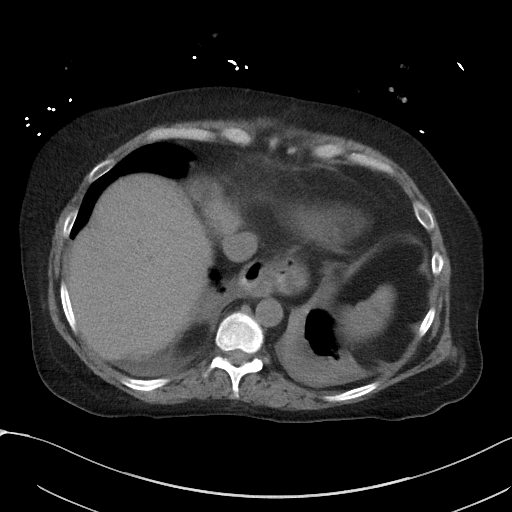
[im 87/91  soft-tissue]
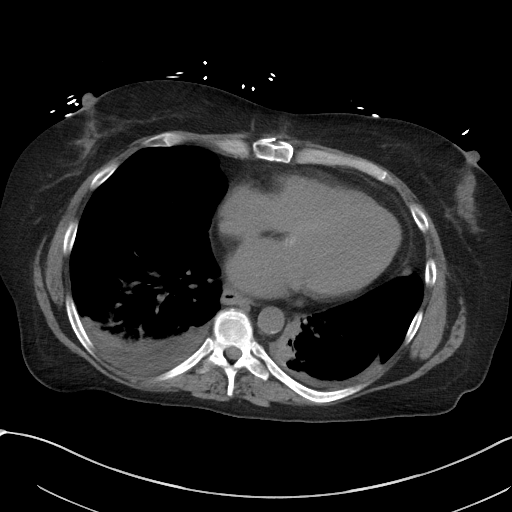

[Series 5: a/p w/o cor · coronal · non-contrast · 0.84mm/px · 3 of 155 slices shown]
[im 52/155  soft-tissue]
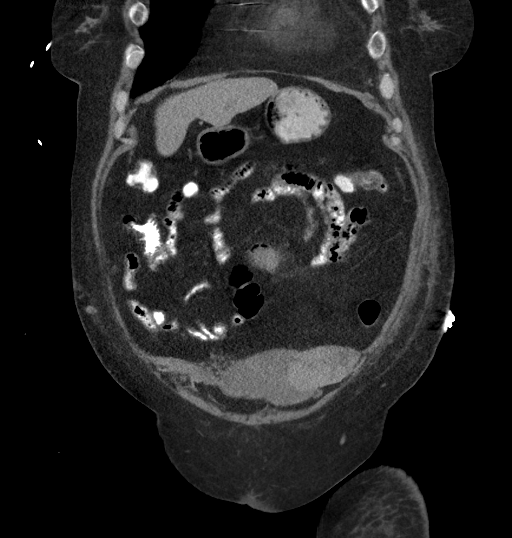
[im 69/155  soft-tissue]
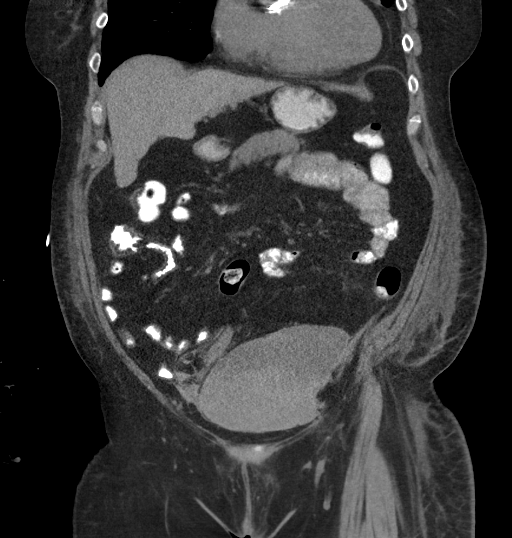
[im 86/155  soft-tissue]
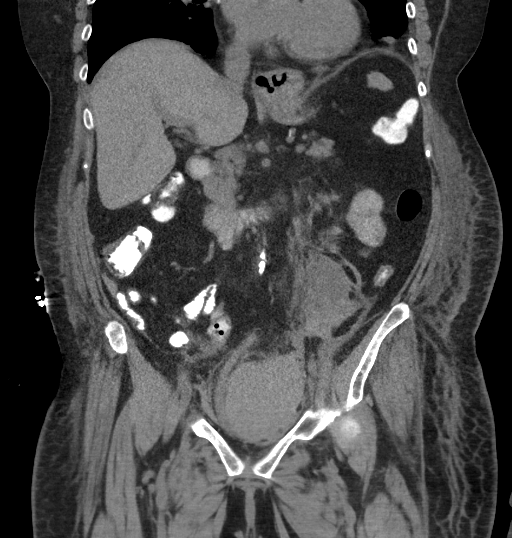

[15 of 46 positions shown; findings below may reference images not displayed]

FINDINGS: Lower chest: Small layering right greater than left bilateral
pleural effusions, new bilaterally. Mild compressive atelectasis in
the dependent lower lobes bilaterally. Patchy mild ground-glass
attenuation in the lower parahilar lungs bilaterally appears new.
Stable top-normal heart size. Partially visualized aortic valve
prosthesis is in place. Visualized lower sternotomy wires appear
intact.

Hepatobiliary: Normal liver with no liver mass. Cholecystectomy. No
biliary ductal dilatation.

Pancreas: Normal, with no mass or duct dilation.

Spleen: Normal size. No mass.

Adrenals/Urinary Tract: Normal adrenals. No right renal stones.
Punctate nonobstructing 2 mm upper left renal stone. No
hydronephrosis. No contour deforming renal mass. Normal caliber
ureters. Bladder is completely collapsed by indwelling Foley
catheter. No bladder stones or bladder wall thickening.

Stomach/Bowel: Stable small to moderate hiatal hernia. Otherwise
grossly normal stomach. Normal caliber small bowel with no small
bowel wall thickening. Normal appendix. Normal large bowel with no
diverticulosis, large bowel wall thickening or pericolonic fat
stranding.

Vascular/Lymphatic: Atherosclerotic nonaneurysmal abdominal aorta.
No pathologically enlarged lymph nodes in the abdomen or pelvis.

Reproductive: Grossly normal uterus.  No adnexal mass.

Other: No pneumoperitoneum.  No ascites.

Large left rectus sheath hematoma measures 9.7 x 4.0 x 14.7 cm,
previously 8.6 x 5.0 x 14.7 cm on 05/24/2016 using similar
measurement technique, not definitely changed, although with
increased layering hyperdense blood products.

Large left anterior pelvic extraperitoneal hematoma measures 13.8 x
10.1 x 8.9 cm (series 2/image 69), previously 11.0 x 8.4 x 8.3 cm
using similar measurement technique, increased, with increased
internal layering hyperdense blood products.

New left posterior paranephric space retroperitoneal 4.3 x 4.3 x
cm hematoma extending superiorly from the extraperitoneal left
anterior pelvic hematoma (series 2/image 49).

Midline extraperitoneal ventral abdominal 8.3 x 7.0 x 8.0 cm
hematoma with layering hyperdense blood products (series 2/image
43), previously 8.2 x 7.0 x 7.9 cm, not appreciably changed.

Deep subcutaneous hematoma in the left supraumbilical ventral
abdominal wall with internal gas measures 8.2 x 2.6 x 6.0 cm (series
2/image 44), previously 8.2 x 2.8 by 6.2 cm using similar
measurement technique, not appreciably changed.

Musculoskeletal: No aggressive appearing focal osseous lesions. Mild
thoracolumbar spondylosis .
IMPRESSION: 1. Interval growth of large left anterior pelvic extraperitoneal
hematoma as described. New left retroperitoneal hematoma extending
superiorly from the left anterior pelvic extraperitoneal hematoma.
Findings suggest ongoing extraperitoneal bleeding in the left
anterior pelvis/ left retroperitoneum.
2. Large left rectus muscle sheath, extraperitoneal ventral midline
abdominal and deep subcutaneous ventral abdominal wall hematomas
appear stable.
3. New small bilateral pleural effusions, right greater than left.
No ascites.
4. New patchy mild ground-glass attenuation in the lower parahilar
lungs bilaterally, which could represent mild pulmonary edema or
pneumonitis such as from aspiration.
5. Additional findings include aortic atherosclerosis, punctate
nonobstructing left renal stone and small to moderate hiatal hernia.

These results were called by telephone at the time of interpretation
acknowledged these results.

## 2017-10-20 ENCOUNTER — Ambulatory Visit (INDEPENDENT_AMBULATORY_CARE_PROVIDER_SITE_OTHER): Payer: 59 | Admitting: *Deleted

## 2017-10-20 DIAGNOSIS — Z952 Presence of prosthetic heart valve: Secondary | ICD-10-CM | POA: Diagnosis not present

## 2017-10-20 DIAGNOSIS — Z5181 Encounter for therapeutic drug level monitoring: Secondary | ICD-10-CM | POA: Diagnosis not present

## 2017-10-20 DIAGNOSIS — Z7901 Long term (current) use of anticoagulants: Secondary | ICD-10-CM | POA: Diagnosis not present

## 2017-10-20 LAB — POCT INR: INR: 2.5

## 2017-10-20 NOTE — Patient Instructions (Signed)
Description   Continue same  dose of coumadin  1 tablet daily except 1 and 1/2 tablets each Monday and  Friday.  Recheck INR in 4 weeks.   Call coumadin clinic with any new medications scheduled for any procedures or with any questions [phone].Try to do 1 serving of dark leafy greens  a week

## 2017-10-27 ENCOUNTER — Ambulatory Visit (INDEPENDENT_AMBULATORY_CARE_PROVIDER_SITE_OTHER): Payer: 59 | Admitting: Pharmacist

## 2017-10-27 DIAGNOSIS — B182 Chronic viral hepatitis C: Secondary | ICD-10-CM

## 2017-10-27 NOTE — Progress Notes (Signed)
HPI: Elizabeth Morton is a 62 y.o. female who presents to the [facility] for her Hep C cure visit.  Patient Active Problem List   Diagnosis Date Noted  . Chronic hepatitis C without hepatic coma ([place]) 05/04/2017  . Encounter for therapeutic drug monitoring 04/01/2017  . Rectus sheath hematoma, subsequent encounter 06/03/2016  . Pulmonary edema   . Bleeding   . Abdominal distension   . Acute on chronic diastolic congestive heart failure ([place]) 05/26/2016  . Hypotension   . Acute respiratory failure ([place])   . Rectus sheath hematoma, initial encounter 05/24/2016  . S/P repair of ventral hernia Nov 2017 05/15/2016  . Ventral hernia 05/15/2016  . Lower GI bleed   . Hypokalemia 06/26/2015  . Acute GI bleeding 06/25/2015  . Chronic congestive heart failure with left ventricular diastolic dysfunction ([place]) 06/25/2015  . Hypothyroidism 06/25/2015  . Acute blood loss anemia 06/25/2015  . Long term (current) use of anticoagulants 06/02/2012  . Preop cardiovascular exam   . Pericardial effusion   . CAD (coronary artery disease)   . S/P aortic valve replacement   . Aortic stenosis   . Ejection fraction   . Ecchymosis   . Chronic cholecystitis with calculus 05/25/2012  . Constipation, chronic 05/25/2012  . Needs screening Colonoscopy - Pt refused 05/25/2012  . Warfarin anticoagulation   . Hypertension   . Hx of CABG   . CVA (cerebral infarction)   . Dyslipidemia   . Shortness of breath 04/16/2010    Patient's Medications  New Prescriptions   No medications on file  Previous Medications   ACETAMINOPHEN (TYLENOL) 500 MG TABLET    Take 500-1,000 mg by mouth every 6 (six) hours as needed (for pain).   CALCIUM CARBONATE-VITAMIN D (CALCIUM 600+D) 600-400 MG-UNIT PER TABLET    Take 1 tablet by mouth 2 (two) times daily.   FUROSEMIDE (LASIX) 20 MG TABLET    Take 20 mg by mouth daily.   LEVOTHYROXINE (SYNTHROID, LEVOTHROID) 50 MCG TABLET    Take 50 mcg by mouth daily before  breakfast.   OMEPRAZOLE (PRILOSEC) 20 MG CAPSULE    Take 20 mg by mouth daily.     ONDANSETRON (ZOFRAN ODT) 4 MG DISINTEGRATING TABLET    Take 1 tablet (4 mg total) every 8 (eight) hours as needed by mouth for nausea or vomiting.   POTASSIUM CHLORIDE (MICRO-K) 10 MEQ CR CAPSULE    TAKE 1 TABLET BY MOUTH EVERY DAY   SIMVASTATIN (ZOCOR) 40 MG TABLET    Take 1 tablet (40 mg total) by mouth at bedtime.   TRIAZOLAM (HALCION) 0.25 MG TABLET    Take 1 tablet by mouth at bedtime.   WARFARIN (COUMADIN) 3 MG TABLET    TAKE 1 TABLET (3 MG TOTAL) BY MOUTH AS DIRECTED.  Modified Medications   No medications on file  Discontinued Medications   No medications on file    Allergies: No Known Allergies  Past Medical History: Past Medical History:  Diagnosis Date  . Aortic stenosis    Aortic valve replacement 2006  . CAD (coronary artery disease)    a. s/p CABG 2006 at time of AVR.  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 Dyslipidemia   . Gallstones   . GERD (gastroesophageal reflux disease)   . Heart murmur   . Hx of CABG    2006,LIMA to LAD, SVG to diagonal  . Hx of transfusion of packed red  blood cells   . Hypertension   . Hypothyroidism   . Iron deficiency anemia   . Lower GI bleed 06/2015  . Pericardial effusion    Insignificant small pericardial effusion seen on echo, November, 2013  . Peripheral vascular disease ([place]) 06   blood clot -stroke  . S/P aortic valve replacement    a. Bentall procedure,#21 [employer] mechanical valve conduit with reimplantation of the coronaries 2006  . Status post colonoscopy with polypectomy    "bleeding; colonoscopy was ~ 06/18/2015"  . Stroke [facility]) 2006   no deficits   . Warfarin anticoagulation    coumadin therapy    Social History: Social History   Socioeconomic History  . Marital status: Married    Spouse name: Not on file  . Number of children: 1  . Years of education: Not on file  . Highest education  level: Not on file  Occupational History  . Occupation: homemaker  Social Needs  . Financial resource strain: Not on file  . Food insecurity:    Worry: Not on file    Inability: Not on file  . Transportation needs:    Medical: Not on file    Non-medical: Not on file  Tobacco Use  . Smoking status: Former Smoker    Packs/day: 1.00    Years: 25.00    Pack years: 25.00    Types: Cigarettes    Last attempt to quit: 07/15/2004    Years since quitting: 13.2  . Smokeless tobacco: Never Used  Substance and Sexual Activity  . Alcohol use: No  . Drug use: No  . Sexual activity: Never  Lifestyle  . Physical activity:    Days per week: Not on file    Minutes per session: Not on file  . Stress: Not on file  Relationships  . Social connections:    Talks on phone: Not on file    Gets together: Not on file    Attends religious service: Not on file    Active member of club or organization: Not on file    Attends meetings of clubs or organizations: Not on file    Relationship status: Not on file  Other Topics Concern  . Not on file  Social History Narrative   She lives in [place] with her husband and son. She previously smoked 37-pack-years,but quit following her stroke in April of 2006. She denies any alcohol or drugs. She has not been routinely exercising.    Labs: Hepatitis C Lab Results  Component Value Date   HCVGENOTYPE 1a 05/04/2017   HCVRNAPCRQN <15 NOT DETECTED 07/28/2017   HCVRNAPCRQN <15 NOT DETECTED 06/16/2017   FIBROSTAGE F1-F2 05/04/2017    Hepatitis B Lab Results  Component Value Date   HEPBSAB NON-REACTIVE 05/04/2017   HEPBSAG NON-REACTIVE 05/04/2017   HEPBCAB NON-REACTIVE 05/04/2017    Hepatitis A Lab Results  Component Value Date   HAV REACTIVE (A) 05/04/2017    HIV Lab Results  Component Value Date   HIV NON-REACTIVE 05/04/2017    AST (U/L)  Date Value  05/04/2017 24  05/24/2016 44 (H)  05/16/2016 45 (H)   ALT (U/L)  Date Value    05/04/2017 22  05/04/2017 20  05/24/2016 16  05/16/2016 29   POC INR (no units)  Date Value  09/26/2010 3.0  09/12/2010 4.6  08/22/2010 4.4   INR (no units)  Date Value  10/20/2017 2.5  09/22/2017 2.7  09/01/2017 3.5  06/07/2016 2.19  06/06/2016 1.96  06/05/2016 1.74  Fibrosis Score: F0/F1  Assessment: Amamda is here for her Hep C cure visit.  She completed 8 weeks of Harvoni back in January. Her early on treatment and end of treatment Hep C viral loads were both undetectable.  I will get a final Hep C viral load today and call her with the results.   Plan: - Hep C VL SVR12 - Call with results  Cassie L. Kuppelweiser, PharmD, [place], [facility] 10/27/2017, 1:49 PM

## 2017-10-30 LAB — HEPATITIS C RNA QUANTITATIVE
HCV QUANT LOG: NOT DETECTED {Log_IU}/mL
HCV RNA, PCR, QN: NOT DETECTED [IU]/mL

## 2017-11-01 ENCOUNTER — Telehealth: Payer: Self-pay | Admitting: Pharmacist

## 2017-11-01 NOTE — Telephone Encounter (Signed)
Called Elizabeth Morton to let her know that her SVR12 lab was undetectable and she is cured of her Hep C.

## 2017-11-17 ENCOUNTER — Ambulatory Visit (INDEPENDENT_AMBULATORY_CARE_PROVIDER_SITE_OTHER): Payer: 59 | Admitting: Pharmacist

## 2017-11-17 DIAGNOSIS — Z952 Presence of prosthetic heart valve: Secondary | ICD-10-CM

## 2017-11-17 DIAGNOSIS — Z5181 Encounter for therapeutic drug level monitoring: Secondary | ICD-10-CM | POA: Diagnosis not present

## 2017-11-17 DIAGNOSIS — Z7901 Long term (current) use of anticoagulants: Secondary | ICD-10-CM

## 2017-11-17 LAB — POCT INR: INR: 3.1

## 2017-11-17 NOTE — Patient Instructions (Signed)
Description   Take 1/2 tablet today, then continue same  dose of coumadin 1 tablet daily except 1 and 1/2 tablets each Monday and Friday.  Recheck INR in 4 weeks.  Call coumadin clinic with any new medications scheduled for any procedures or with any questions [phone].Try to do 1 serving of dark leafy greens  a week

## 2017-12-15 ENCOUNTER — Ambulatory Visit (INDEPENDENT_AMBULATORY_CARE_PROVIDER_SITE_OTHER): Payer: 59 | Admitting: *Deleted

## 2017-12-15 DIAGNOSIS — Z952 Presence of prosthetic heart valve: Secondary | ICD-10-CM | POA: Diagnosis not present

## 2017-12-15 DIAGNOSIS — Z7901 Long term (current) use of anticoagulants: Secondary | ICD-10-CM

## 2017-12-15 DIAGNOSIS — Z5181 Encounter for therapeutic drug level monitoring: Secondary | ICD-10-CM

## 2017-12-15 LAB — POCT INR: INR: 3 (ref 2.0–3.0)

## 2017-12-15 NOTE — Patient Instructions (Addendum)
Description   Take 1/2 tablet today, then continue same  dose of coumadin 1 tablet daily except 1 and 1/2 tablets each Monday and Friday.  Recheck INR in 4 weeks.  Call coumadin clinic with any new medications scheduled for any procedures or with any questions [phone]. Continue eating 1 serving of dark leafy greens  a week

## 2018-01-10 ENCOUNTER — Ambulatory Visit (INDEPENDENT_AMBULATORY_CARE_PROVIDER_SITE_OTHER): Payer: 59 | Admitting: *Deleted

## 2018-01-10 DIAGNOSIS — Z5181 Encounter for therapeutic drug level monitoring: Secondary | ICD-10-CM

## 2018-01-10 DIAGNOSIS — Z7901 Long term (current) use of anticoagulants: Secondary | ICD-10-CM

## 2018-01-10 DIAGNOSIS — Z952 Presence of prosthetic heart valve: Secondary | ICD-10-CM | POA: Diagnosis not present

## 2018-01-10 LAB — POCT INR: INR: 2.6 (ref 2.0–3.0)

## 2018-01-10 NOTE — Patient Instructions (Signed)
Description   Continue same dose of coumadin 1 tablet daily except 1 and 1/2 tablets each Monday and Friday.  Recheck INR in 5 weeks.  Call coumadin clinic with any new medications scheduled for any procedures or with any questions [phone]. Continue eating 1 serving of dark leafy greens a week.

## 2018-01-16 ENCOUNTER — Other Ambulatory Visit: Payer: Self-pay | Admitting: Cardiology

## 2018-02-14 ENCOUNTER — Ambulatory Visit (INDEPENDENT_AMBULATORY_CARE_PROVIDER_SITE_OTHER): Payer: 59 | Admitting: *Deleted

## 2018-02-14 ENCOUNTER — Encounter (INDEPENDENT_AMBULATORY_CARE_PROVIDER_SITE_OTHER): Payer: Self-pay

## 2018-02-14 DIAGNOSIS — Z5181 Encounter for therapeutic drug level monitoring: Secondary | ICD-10-CM

## 2018-02-14 DIAGNOSIS — Z7901 Long term (current) use of anticoagulants: Secondary | ICD-10-CM | POA: Diagnosis not present

## 2018-02-14 DIAGNOSIS — Z952 Presence of prosthetic heart valve: Secondary | ICD-10-CM | POA: Diagnosis not present

## 2018-02-14 LAB — POCT INR: INR: 2.4 (ref 2.0–3.0)

## 2018-02-14 NOTE — Patient Instructions (Signed)
Description   Continue same dose of coumadin 1 tablet daily except 1 and 1/2 tablets each Monday and Friday.  Recheck INR in 6 weeks.  Call coumadin clinic with any new medications scheduled for any procedures or with any questions [phone]. Continue eating 1 serving of dark leafy greens a week.

## 2018-03-28 ENCOUNTER — Ambulatory Visit (INDEPENDENT_AMBULATORY_CARE_PROVIDER_SITE_OTHER): Payer: 59

## 2018-03-28 DIAGNOSIS — Z5181 Encounter for therapeutic drug level monitoring: Secondary | ICD-10-CM

## 2018-03-28 DIAGNOSIS — Z7901 Long term (current) use of anticoagulants: Secondary | ICD-10-CM

## 2018-03-28 DIAGNOSIS — Z952 Presence of prosthetic heart valve: Secondary | ICD-10-CM | POA: Diagnosis not present

## 2018-03-28 LAB — POCT INR: INR: 2.1 (ref 2.0–3.0)

## 2018-03-28 NOTE — Patient Instructions (Signed)
Description   Continue same dose of coumadin 1 tablet daily except 1.5 tablets on Mondays and Fridays.  Recheck INR in 6 weeks.  Call coumadin clinic with any new medications scheduled for any procedures or with any questions #[phone].      

## 2018-05-09 ENCOUNTER — Ambulatory Visit (INDEPENDENT_AMBULATORY_CARE_PROVIDER_SITE_OTHER): Payer: 59 | Admitting: *Deleted

## 2018-05-09 DIAGNOSIS — Z5181 Encounter for therapeutic drug level monitoring: Secondary | ICD-10-CM

## 2018-05-09 DIAGNOSIS — Z952 Presence of prosthetic heart valve: Secondary | ICD-10-CM

## 2018-05-09 DIAGNOSIS — Z7901 Long term (current) use of anticoagulants: Secondary | ICD-10-CM

## 2018-05-09 LAB — POCT INR: INR: 2.5 (ref 2.0–3.0)

## 2018-05-09 NOTE — Patient Instructions (Signed)
Description   Continue same dose of coumadin 1 tablet daily except 1.5 tablets on Mondays and Fridays.  Recheck INR in 6 weeks.  Call coumadin clinic with any new medications scheduled for any procedures or with any questions #[phone].      

## 2018-05-10 ENCOUNTER — Other Ambulatory Visit: Payer: Self-pay | Admitting: Internal Medicine

## 2018-05-10 DIAGNOSIS — Z1231 Encounter for screening mammogram for malignant neoplasm of breast: Secondary | ICD-10-CM

## 2018-05-14 ENCOUNTER — Other Ambulatory Visit: Payer: Self-pay | Admitting: Cardiology

## 2018-06-22 ENCOUNTER — Ambulatory Visit (INDEPENDENT_AMBULATORY_CARE_PROVIDER_SITE_OTHER): Payer: 59 | Admitting: Pharmacist

## 2018-06-22 ENCOUNTER — Ambulatory Visit
Admission: RE | Admit: 2018-06-22 | Discharge: 2018-06-22 | Disposition: A | Discharge status: Home / Self Care | Payer: 59 | Source: Ambulatory Visit | Attending: Internal Medicine | Admitting: Internal Medicine

## 2018-06-22 DIAGNOSIS — Z1231 Encounter for screening mammogram for malignant neoplasm of breast: Secondary | ICD-10-CM

## 2018-06-22 DIAGNOSIS — Z7901 Long term (current) use of anticoagulants: Secondary | ICD-10-CM | POA: Diagnosis not present

## 2018-06-22 DIAGNOSIS — Z952 Presence of prosthetic heart valve: Secondary | ICD-10-CM | POA: Diagnosis not present

## 2018-06-22 DIAGNOSIS — Z5181 Encounter for therapeutic drug level monitoring: Secondary | ICD-10-CM

## 2018-06-22 LAB — POCT INR: INR: 3.9 — AB (ref 2.0–3.0)

## 2018-06-22 NOTE — Patient Instructions (Signed)
Hold warfarin tonight then continue same dose of coumadin 1 tablet daily except 1.5 tablets on Mondays and Fridays.  Recheck INR in 2 weeks.  Call coumadin clinic with any new medications scheduled for any procedures or with any questions [phone].

## 2018-07-07 ENCOUNTER — Ambulatory Visit (INDEPENDENT_AMBULATORY_CARE_PROVIDER_SITE_OTHER): Payer: 59 | Admitting: *Deleted

## 2018-07-07 DIAGNOSIS — Z952 Presence of prosthetic heart valve: Secondary | ICD-10-CM

## 2018-07-07 DIAGNOSIS — Z7901 Long term (current) use of anticoagulants: Secondary | ICD-10-CM | POA: Diagnosis not present

## 2018-07-07 DIAGNOSIS — Z5181 Encounter for therapeutic drug level monitoring: Secondary | ICD-10-CM | POA: Diagnosis not present

## 2018-07-07 LAB — POCT INR: INR: 2.3 (ref 2.0–3.0)

## 2018-07-07 NOTE — Patient Instructions (Signed)
Description   Continue same dose of coumadin 1 tablet daily except 1.5 tablets on Mondays and Fridays.  Recheck INR in 4 weeks.  Call coumadin clinic with any new medications scheduled for any procedures or with any questions [phone].

## 2018-08-04 ENCOUNTER — Ambulatory Visit (INDEPENDENT_AMBULATORY_CARE_PROVIDER_SITE_OTHER): Payer: 59

## 2018-08-04 ENCOUNTER — Encounter (INDEPENDENT_AMBULATORY_CARE_PROVIDER_SITE_OTHER): Payer: Self-pay

## 2018-08-04 DIAGNOSIS — Z5181 Encounter for therapeutic drug level monitoring: Secondary | ICD-10-CM

## 2018-08-04 DIAGNOSIS — Z952 Presence of prosthetic heart valve: Secondary | ICD-10-CM | POA: Diagnosis not present

## 2018-08-04 DIAGNOSIS — Z7901 Long term (current) use of anticoagulants: Secondary | ICD-10-CM

## 2018-08-04 LAB — POCT INR: INR: 2.7 (ref 2.0–3.0)

## 2018-08-04 NOTE — Patient Instructions (Signed)
Description   Continue same dose of coumadin 1 tablet daily except 1.5 tablets on Mondays and Fridays.  Recheck INR in 6 weeks.  Call coumadin clinic with any new medications scheduled for any procedures or with any questions [phone].

## 2018-09-15 ENCOUNTER — Encounter (INDEPENDENT_AMBULATORY_CARE_PROVIDER_SITE_OTHER): Payer: Self-pay

## 2018-09-15 ENCOUNTER — Other Ambulatory Visit: Payer: Self-pay | Admitting: Cardiology

## 2018-09-15 ENCOUNTER — Ambulatory Visit (INDEPENDENT_AMBULATORY_CARE_PROVIDER_SITE_OTHER): Payer: 59 | Admitting: Pharmacist

## 2018-09-15 DIAGNOSIS — Z952 Presence of prosthetic heart valve: Secondary | ICD-10-CM

## 2018-09-15 DIAGNOSIS — Z5181 Encounter for therapeutic drug level monitoring: Secondary | ICD-10-CM | POA: Diagnosis not present

## 2018-09-15 DIAGNOSIS — Z7901 Long term (current) use of anticoagulants: Secondary | ICD-10-CM

## 2018-09-15 LAB — POCT INR: INR: 3.6 — AB (ref 2.0–3.0)

## 2018-09-15 NOTE — Patient Instructions (Signed)
Description   Hold tonight's dose, then continue same dose of coumadin 1 tablet daily except 1.5 tablets on Mondays and Fridays.  Recheck INR in 4 weeks.  Call coumadin clinic with any new medications scheduled for any procedures or with any questions [phone].

## 2018-10-11 ENCOUNTER — Telehealth: Payer: Self-pay

## 2018-10-11 NOTE — Telephone Encounter (Signed)

## 2018-10-13 ENCOUNTER — Other Ambulatory Visit: Payer: Self-pay

## 2018-10-13 ENCOUNTER — Ambulatory Visit (INDEPENDENT_AMBULATORY_CARE_PROVIDER_SITE_OTHER): Payer: 59 | Admitting: Pharmacist

## 2018-10-13 DIAGNOSIS — Z5181 Encounter for therapeutic drug level monitoring: Secondary | ICD-10-CM | POA: Diagnosis not present

## 2018-10-13 DIAGNOSIS — Z7901 Long term (current) use of anticoagulants: Secondary | ICD-10-CM

## 2018-10-13 DIAGNOSIS — Z952 Presence of prosthetic heart valve: Secondary | ICD-10-CM | POA: Diagnosis not present

## 2018-10-13 LAB — POCT INR: INR: 3.8 — AB (ref 2.0–3.0)

## 2018-11-09 ENCOUNTER — Telehealth: Payer: Self-pay

## 2018-11-09 NOTE — Telephone Encounter (Signed)

## 2018-11-10 ENCOUNTER — Other Ambulatory Visit: Payer: Self-pay

## 2018-11-10 ENCOUNTER — Ambulatory Visit (INDEPENDENT_AMBULATORY_CARE_PROVIDER_SITE_OTHER): Payer: 59 | Admitting: Pharmacist

## 2018-11-10 DIAGNOSIS — Z7901 Long term (current) use of anticoagulants: Secondary | ICD-10-CM | POA: Diagnosis not present

## 2018-11-10 DIAGNOSIS — Z952 Presence of prosthetic heart valve: Secondary | ICD-10-CM | POA: Diagnosis not present

## 2018-11-10 DIAGNOSIS — Z5181 Encounter for therapeutic drug level monitoring: Secondary | ICD-10-CM | POA: Diagnosis not present

## 2018-11-10 LAB — POCT INR: INR: 3 (ref 2.0–3.0)

## 2018-12-12 ENCOUNTER — Telehealth: Payer: Self-pay

## 2018-12-12 NOTE — Telephone Encounter (Signed)

## 2018-12-12 NOTE — Telephone Encounter (Signed)
lmom for reschedule and to prescreen

## 2018-12-21 ENCOUNTER — Encounter (INDEPENDENT_AMBULATORY_CARE_PROVIDER_SITE_OTHER): Payer: Self-pay

## 2018-12-21 ENCOUNTER — Ambulatory Visit (INDEPENDENT_AMBULATORY_CARE_PROVIDER_SITE_OTHER): Payer: 59 | Admitting: Pharmacist

## 2018-12-21 ENCOUNTER — Other Ambulatory Visit: Payer: Self-pay

## 2018-12-21 DIAGNOSIS — Z5181 Encounter for therapeutic drug level monitoring: Secondary | ICD-10-CM

## 2018-12-21 DIAGNOSIS — Z7901 Long term (current) use of anticoagulants: Secondary | ICD-10-CM

## 2018-12-21 DIAGNOSIS — Z952 Presence of prosthetic heart valve: Secondary | ICD-10-CM

## 2018-12-21 LAB — POCT INR: INR: 3 (ref 2.0–3.0)

## 2018-12-21 NOTE — Patient Instructions (Signed)
Description   Continue same dose of coumadin 1 tablet daily except 1.5 tablets on Mondays and Fridays.  Recheck INR in 6 weeks.  Continue eating coleslaw, Call coumadin clinic with any new medications scheduled for any procedures or with any questions [phone].

## 2019-01-07 ENCOUNTER — Other Ambulatory Visit: Payer: Self-pay | Admitting: Cardiology

## 2019-01-17 ENCOUNTER — Emergency Department (HOSPITAL_COMMUNITY): Payer: 59

## 2019-01-17 ENCOUNTER — Encounter (HOSPITAL_COMMUNITY): Payer: Self-pay | Admitting: *Deleted

## 2019-01-17 ENCOUNTER — Inpatient Hospital Stay (HOSPITAL_COMMUNITY)
Admission: EM | Admit: 2019-01-17 | Discharge: 2019-01-21 | DRG: 871 | Disposition: A | Discharge status: Home / Self Care | Payer: 59 | Attending: Family Medicine | Admitting: Family Medicine

## 2019-01-17 ENCOUNTER — Other Ambulatory Visit: Payer: Self-pay

## 2019-01-17 DIAGNOSIS — E785 Hyperlipidemia, unspecified: Secondary | ICD-10-CM | POA: Diagnosis present

## 2019-01-17 DIAGNOSIS — Z79890 Hormone replacement therapy (postmenopausal): Secondary | ICD-10-CM

## 2019-01-17 DIAGNOSIS — Z87891 Personal history of nicotine dependence: Secondary | ICD-10-CM

## 2019-01-17 DIAGNOSIS — A4101 Sepsis due to Methicillin susceptible Staphylococcus aureus: Secondary | ICD-10-CM | POA: Diagnosis not present

## 2019-01-17 DIAGNOSIS — B182 Chronic viral hepatitis C: Secondary | ICD-10-CM | POA: Diagnosis present

## 2019-01-17 DIAGNOSIS — Z8673 Personal history of transient ischemic attack (TIA), and cerebral infarction without residual deficits: Secondary | ICD-10-CM

## 2019-01-17 DIAGNOSIS — Z811 Family history of alcohol abuse and dependence: Secondary | ICD-10-CM

## 2019-01-17 DIAGNOSIS — Z8349 Family history of other endocrine, nutritional and metabolic diseases: Secondary | ICD-10-CM

## 2019-01-17 DIAGNOSIS — R4182 Altered mental status, unspecified: Secondary | ICD-10-CM | POA: Diagnosis not present

## 2019-01-17 DIAGNOSIS — Z8249 Family history of ischemic heart disease and other diseases of the circulatory system: Secondary | ICD-10-CM

## 2019-01-17 DIAGNOSIS — N179 Acute kidney failure, unspecified: Secondary | ICD-10-CM | POA: Diagnosis present

## 2019-01-17 DIAGNOSIS — Z7901 Long term (current) use of anticoagulants: Secondary | ICD-10-CM

## 2019-01-17 DIAGNOSIS — R509 Fever, unspecified: Secondary | ICD-10-CM

## 2019-01-17 DIAGNOSIS — Z951 Presence of aortocoronary bypass graft: Secondary | ICD-10-CM

## 2019-01-17 DIAGNOSIS — Z1159 Encounter for screening for other viral diseases: Secondary | ICD-10-CM

## 2019-01-17 DIAGNOSIS — I350 Nonrheumatic aortic (valve) stenosis: Secondary | ICD-10-CM

## 2019-01-17 DIAGNOSIS — R6521 Severe sepsis with septic shock: Secondary | ICD-10-CM | POA: Diagnosis present

## 2019-01-17 DIAGNOSIS — I10 Essential (primary) hypertension: Secondary | ICD-10-CM | POA: Diagnosis present

## 2019-01-17 DIAGNOSIS — D696 Thrombocytopenia, unspecified: Secondary | ICD-10-CM | POA: Diagnosis present

## 2019-01-17 DIAGNOSIS — A419 Sepsis, unspecified organism: Secondary | ICD-10-CM | POA: Diagnosis present

## 2019-01-17 DIAGNOSIS — R7881 Bacteremia: Secondary | ICD-10-CM

## 2019-01-17 DIAGNOSIS — E039 Hypothyroidism, unspecified: Secondary | ICD-10-CM | POA: Diagnosis present

## 2019-01-17 DIAGNOSIS — Z823 Family history of stroke: Secondary | ICD-10-CM

## 2019-01-17 DIAGNOSIS — E860 Dehydration: Secondary | ICD-10-CM | POA: Diagnosis present

## 2019-01-17 DIAGNOSIS — E871 Hypo-osmolality and hyponatremia: Secondary | ICD-10-CM | POA: Diagnosis present

## 2019-01-17 DIAGNOSIS — K219 Gastro-esophageal reflux disease without esophagitis: Secondary | ICD-10-CM | POA: Diagnosis present

## 2019-01-17 DIAGNOSIS — R739 Hyperglycemia, unspecified: Secondary | ICD-10-CM | POA: Diagnosis present

## 2019-01-17 DIAGNOSIS — I739 Peripheral vascular disease, unspecified: Secondary | ICD-10-CM | POA: Diagnosis present

## 2019-01-17 DIAGNOSIS — Z952 Presence of prosthetic heart valve: Secondary | ICD-10-CM

## 2019-01-17 DIAGNOSIS — Z981 Arthrodesis status: Secondary | ICD-10-CM

## 2019-01-17 DIAGNOSIS — B9561 Methicillin susceptible Staphylococcus aureus infection as the cause of diseases classified elsewhere: Secondary | ICD-10-CM | POA: Diagnosis present

## 2019-01-17 DIAGNOSIS — I2510 Atherosclerotic heart disease of native coronary artery without angina pectoris: Secondary | ICD-10-CM | POA: Diagnosis present

## 2019-01-17 DIAGNOSIS — E876 Hypokalemia: Secondary | ICD-10-CM | POA: Diagnosis present

## 2019-01-17 LAB — COMPREHENSIVE METABOLIC PANEL
ALT: 96 U/L — ABNORMAL HIGH (ref 0–44)
AST: 133 U/L — ABNORMAL HIGH (ref 15–41)
Albumin: 3.9 g/dL (ref 3.5–5.0)
Alkaline Phosphatase: 102 U/L (ref 38–126)
Anion gap: 14 (ref 5–15)
BUN: 17 mg/dL (ref 8–23)
CO2: 17 mmol/L — ABNORMAL LOW (ref 22–32)
Calcium: 9.3 mg/dL (ref 8.9–10.3)
Chloride: 103 mmol/L (ref 98–111)
Creatinine, Ser: 1.67 mg/dL — ABNORMAL HIGH (ref 0.44–1.00)
GFR calc Af Amer: 38 mL/min — ABNORMAL LOW (ref >60)
GFR calc non Af Amer: 33 mL/min — ABNORMAL LOW (ref >60)
Glucose, Bld: 186 mg/dL — ABNORMAL HIGH (ref 70–99)
Potassium: 3.4 mmol/L — ABNORMAL LOW (ref 3.5–5.1)
Sodium: 134 mmol/L — ABNORMAL LOW (ref 135–145)
Total Bilirubin: 1.5 mg/dL — ABNORMAL HIGH (ref 0.3–1.2)
Total Protein: 7.6 g/dL (ref 6.5–8.1)

## 2019-01-17 LAB — CBC WITH DIFFERENTIAL/PLATELET
Abs Immature Granulocytes: 0.07 10*3/uL (ref 0.00–0.07)
Basophils Absolute: 0 10*3/uL (ref 0.0–0.1)
Basophils Relative: 0 %
Eosinophils Absolute: 0 10*3/uL (ref 0.0–0.5)
Eosinophils Relative: 0 %
HCT: 46.3 % — ABNORMAL HIGH (ref 36.0–46.0)
Hemoglobin: 15.9 g/dL — ABNORMAL HIGH (ref 12.0–15.0)
Immature Granulocytes: 1 %
Lymphocytes Relative: 5 %
Lymphs Abs: 0.5 10*3/uL — ABNORMAL LOW (ref 0.7–4.0)
MCH: 30.6 pg (ref 26.0–34.0)
MCHC: 34.3 g/dL (ref 30.0–36.0)
MCV: 89 fL (ref 80.0–100.0)
Monocytes Absolute: 0.3 10*3/uL (ref 0.1–1.0)
Monocytes Relative: 3 %
Neutro Abs: 8.2 10*3/uL — ABNORMAL HIGH (ref 1.7–7.7)
Neutrophils Relative %: 91 %
Platelets: 148 10*3/uL — ABNORMAL LOW (ref 150–400)
RBC: 5.2 MIL/uL — ABNORMAL HIGH (ref 3.87–5.11)
RDW: 13 % (ref 11.5–15.5)
WBC: 9 10*3/uL (ref 4.0–10.5)
nRBC: 0 % (ref 0.0–0.2)

## 2019-01-17 LAB — URINALYSIS, ROUTINE W REFLEX MICROSCOPIC
Bilirubin Urine: NEGATIVE
Glucose, UA: NEGATIVE mg/dL
Ketones, ur: NEGATIVE mg/dL
Leukocytes,Ua: NEGATIVE
Nitrite: NEGATIVE
Protein, ur: >=300 mg/dL — AB
Specific Gravity, Urine: 1.018 (ref 1.005–1.030)
pH: 7 (ref 5.0–8.0)

## 2019-01-17 LAB — PROTIME-INR
INR: 2 — ABNORMAL HIGH (ref 0.8–1.2)
Prothrombin Time: 22.7 seconds — ABNORMAL HIGH (ref 11.4–15.2)

## 2019-01-17 LAB — LACTIC ACID, PLASMA: Lactic Acid, Venous: 3.7 mmol/L (ref 0.5–1.9)

## 2019-01-17 MED ORDER — VANCOMYCIN HCL IN DEXTROSE 1-5 GM/200ML-% IV SOLN
1000.0000 mg | Freq: Once | INTRAVENOUS | Status: AC
  Administered 2019-01-18: 1000 mg via INTRAVENOUS
  Filled 2019-01-17: qty 200

## 2019-01-17 MED ORDER — LACTATED RINGERS IV BOLUS (SEPSIS)
250.0000 mL | Freq: Once | INTRAVENOUS | Status: AC
  Administered 2019-01-18: 250 mL via INTRAVENOUS

## 2019-01-17 MED ORDER — METRONIDAZOLE IN NACL 5-0.79 MG/ML-% IV SOLN
500.0000 mg | Freq: Once | INTRAVENOUS | Status: AC
  Administered 2019-01-18: 500 mg via INTRAVENOUS
  Filled 2019-01-17: qty 100

## 2019-01-17 MED ORDER — LACTATED RINGERS IV BOLUS (SEPSIS)
1000.0000 mL | Freq: Once | INTRAVENOUS | Status: AC
  Administered 2019-01-18: 1000 mL via INTRAVENOUS

## 2019-01-17 MED ORDER — VANCOMYCIN HCL IN DEXTROSE 1-5 GM/200ML-% IV SOLN: 1000.0000 mg | Freq: Once | INTRAVENOUS | Status: DC

## 2019-01-17 MED ORDER — SODIUM CHLORIDE 0.9 % IV SOLN
2.0000 g | Freq: Once | INTRAVENOUS | Status: AC
  Administered 2019-01-18: 2 g via INTRAVENOUS
  Filled 2019-01-17: qty 2

## 2019-01-17 MED ORDER — SODIUM CHLORIDE 0.9 % IV SOLN: 2.0000 g | Freq: Once | INTRAVENOUS | Status: DC

## 2019-01-17 MED ORDER — SODIUM CHLORIDE 0.9% FLUSH
3.0000 mL | Freq: Once | INTRAVENOUS | Status: AC
  Administered 2019-01-18: 06:00:00 3 mL via INTRAVENOUS

## 2019-01-17 MED ORDER — ACETAMINOPHEN 325 MG PO TABS
650.0000 mg | ORAL_TABLET | Freq: Once | ORAL | Status: AC
  Administered 2019-01-17: 650 mg via ORAL
  Filled 2019-01-17: qty 2

## 2019-01-17 NOTE — ED Notes (Signed)
Pt was  walking around waiting room very confused. Tech has sat pt down several times due to unsteadiness while walking. Pt's husband is with to help communicate for her.

## 2019-01-17 NOTE — ED Triage Notes (Signed)
Pt presents with her spouse. Spouse reporting fever last night and c/o back pain. She is disoriented today and incontinent of stool and urine. She also has been weak and not eating today> Pt is oriented to self.

## 2019-01-17 NOTE — ED Provider Notes (Signed)
[facility] EMERGENCY DEPARTMENT Provider Note   CSN: [id number] Arrival date & time: 01/17/19  2049     History   Chief Complaint Chief Complaint  Patient presents with  . Altered Mental Status    HPI Elizabeth Morton is a 63 y.o. female.     HPI  Patient is a 63 year old female with a history of aortic stenosis status post aortic valve replacement, anticoagulated on warfarin, CAD, hypertension, GI bleeding presenting for fever and altered mental status.  She presents with her husband who assist in history taking.  According the patient and her husband, she was complaining of some low back pain yesterday.  Denies any overt weakness or numbness.  Earlier today, husband did say she had an episode of incontinence of both stool and bowel.  She is complained no further of back or abdominal pain today.  Patient's husband does not know what her temperature was at home but felt that she was "warm" last night.  No cough, shortness of breath, or chest pain.  Patient does not have a history of IVDU.  She is edentulous since her open heart surgery.   Past Medical History:  Diagnosis Date  . Aortic stenosis    Aortic valve replacement 2006  . CAD (coronary artery disease)    a. s/p CABG 2006 at time of AVR.  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 Dyslipidemia   . Gallstones   . GERD (gastroesophageal reflux disease)   . Heart murmur   . Hx of CABG    2006,LIMA to LAD, SVG to diagonal  . Hx of transfusion of packed red blood cells   . Hypertension   . Hypothyroidism   . Iron deficiency anemia   . Lower GI bleed 06/2015  . Pericardial effusion    Insignificant small pericardial effusion seen on echo, November, 2013  . Peripheral vascular disease ([place]) 06   blood clot -stroke  . S/P aortic valve replacement    a. [place] procedure,#21 St. Jude mechanical valve conduit with reimplantation of the coronaries 2006  . Status post  colonoscopy with polypectomy    "bleeding; colonoscopy was ~ 06/18/2015"  . Stroke [facility]) 2006   no deficits   . Warfarin anticoagulation    coumadin therapy    Patient Active Problem List   Diagnosis Date Noted  . Chronic hepatitis C without hepatic coma ([place]) 05/04/2017  . Encounter for therapeutic drug monitoring 04/01/2017  . Rectus sheath hematoma, subsequent encounter 06/03/2016  . Pulmonary edema   . Bleeding   . Abdominal distension   . Acute on chronic diastolic congestive heart failure ([place]) 05/26/2016  . Hypotension   . Acute respiratory failure ([place])   . Rectus sheath hematoma, initial encounter 05/24/2016  . S/P repair of ventral hernia Nov 2017 05/15/2016  . Ventral hernia 05/15/2016  . Lower GI bleed   . Hypokalemia 06/26/2015  . Acute GI bleeding 06/25/2015  . Chronic congestive heart failure with left ventricular diastolic dysfunction ([place]) 06/25/2015  . Hypothyroidism 06/25/2015  . Acute blood loss anemia 06/25/2015  . Long term (current) use of anticoagulants 06/02/2012  . Preop cardiovascular exam   . Pericardial effusion   . CAD (coronary artery disease)   . S/P aortic valve replacement   . Aortic stenosis   . Ejection fraction   . Ecchymosis   . Chronic cholecystitis with calculus 05/25/2012  . Constipation, chronic 05/25/2012  . Needs screening Colonoscopy -  Pt refused 05/25/2012  . Warfarin anticoagulation   . Hypertension   . Hx of CABG   . CVA (cerebral infarction)   . Dyslipidemia   . Shortness of breath 04/16/2010    Past Surgical History:  Procedure Laterality Date  . AORTIC VALVE REPLACEMENT  2006  . BACK SURGERY    . CARDIAC CATHETERIZATION  ~ 2006  . CARDIAC VALVE REPLACEMENT    . CESAREAN SECTION  1985  . COLONOSCOPY N/A 06/26/2015   Procedure: COLONOSCOPY;  Surgeon: Gatha Mayer, MD;  Location: [place];  Service: Endoscopy;  Laterality: N/A;  . COLONOSCOPY W/ BIOPSIES AND POLYPECTOMY  06/18/2015  . CORONARY ARTERY BYPASS  GRAFT  2006  . ESOPHAGOGASTRODUODENOSCOPY  06/18/2015  . INSERTION OF MESH  05/15/2016   Procedure: INSERTION OF MESH;  Surgeon: Johnathan Hausen, MD;  Location: [facility];  Service: General;;  . LAPAROSCOPIC ASSISTED VENTRAL HERNIA REPAIR N/A 05/15/2016   Procedure: LAPAROSCOPIC ASSISTED VENTRAL HERNIA REPAIR WITH MESH;  Surgeon: Johnathan Hausen, MD;  Location: [facility];  Service: General;  Laterality: N/A;  . LAPAROSCOPIC CHOLECYSTECTOMY SINGLE PORT  07/21/2012   Procedure: LAPAROSCOPIC CHOLECYSTECTOMY SINGLE PORT;  Surgeon: Adin Hector, MD;  Location: [place];  Service: General;  Laterality: N/A;  . LUMBAR FUSION  ~ 1973  . TONSILLECTOMY       OB History   No obstetric history on file.      Home Medications    Prior to Admission medications   Medication Sig Start Date End Date Taking? Authorizing Provider  acetaminophen (TYLENOL) 500 MG tablet Take 500-1,000 mg by mouth every 6 (six) hours as needed (for pain).    [provider]  Calcium Carbonate-Vitamin D (CALCIUM 600+D) 600-400 MG-UNIT per tablet Take 1 tablet by mouth 2 (two) times daily.    [provider]  furosemide (LASIX) 20 MG tablet Take 20 mg by mouth daily. 04/18/17   [provider]  levothyroxine (SYNTHROID, LEVOTHROID) 50 MCG tablet Take 50 mcg by mouth daily before breakfast.    [provider]  Multiple Vitamins-Minerals (PRESERVISION/LUTEIN PO) Take by mouth.    [provider]  omeprazole (PRILOSEC) 20 MG capsule Take 20 mg by mouth daily.      [provider]  ondansetron (ZOFRAN ODT) 4 MG disintegrating tablet Take 1 tablet (4 mg total) every 8 (eight) hours as needed by mouth for nausea or vomiting. 05/18/17   Kuppelweiser, Cassie L, RPH-CPP  potassium chloride (MICRO-K) 10 MEQ CR capsule TAKE 1 TABLET BY MOUTH EVERY DAY 05/02/14   Carlena Bjornstad, MD  simvastatin (ZOCOR) 40 MG tablet Take 1 tablet (40 mg total) by mouth at bedtime. 07/14/13   Carlena Bjornstad, MD   triazolam (HALCION) 0.25 MG tablet Take 1 tablet by mouth at bedtime. 08/12/16   [provider]  warfarin (COUMADIN) 3 MG tablet TAKE 1 TABLET (3 MG TOTAL) BY MOUTH AS DIRECTED. 01/09/19   Dorothy Spark, MD  KLOR-CON M10 10 MEQ tablet TAKE 1 TABLET BY MOUTH EVERY DAY 03/02/14 04/01/14  Carlena Bjornstad, MD    Family History Family History  Problem Relation Age of Onset  . Stroke Mother   . Heart disease Mother   . Alcohol abuse Father   . Hypertension Father   . Stroke Father   . Hyperlipidemia Father   . Stroke Brother        she has three and at least one of them has had a stroke  .  Colon cancer Neg Hx     Social History Social History   Tobacco Use  . Smoking status: Former Smoker    Packs/day: 1.00    Years: 25.00    Pack years: 25.00    Types: Cigarettes    Quit date: 07/15/2004    Years since quitting: 14.5  . Smokeless tobacco: Never Used  Substance Use Topics  . Alcohol use: No  . Drug use: No     Allergies   Patient has no known allergies.   Review of Systems Review of Systems  Constitutional: Positive for chills, diaphoresis, fatigue and fever.  HENT: Negative for congestion and rhinorrhea.   Respiratory: Negative for cough and shortness of breath.   Cardiovascular: Negative for chest pain.  Psychiatric/Behavioral: Positive for confusion.   Level 5 caveat confusion/AMS  Physical Exam Updated Vital Signs BP 117/62 (BP Location: Right Arm)   Pulse (!) 128   Temp (!) 103.7 F (39.8 C) (Rectal)   Resp (!) 30   Ht 5\' 2"  (1.575 m)   Wt 69.9 kg   SpO2 94%   BMI 28.19 kg/m   Physical Exam Vitals signs and nursing note reviewed.  Constitutional:      General: She is not in acute distress.    Appearance: She is well-developed.  HENT:     Head: Normocephalic and atraumatic.     Mouth/Throat:     Mouth: Mucous membranes are dry.  Eyes:     Conjunctiva/sclera: Conjunctivae normal.     Pupils: Pupils are equal, round, and reactive to  light.  Neck:     Musculoskeletal: Normal range of motion and neck supple.  Cardiovascular:     Rate and Rhythm: Regular rhythm. Tachycardia present.     Heart sounds: S1 normal and S2 normal. Murmur present.     Comments: Click of mechanical valve. Pulmonary:     Effort: Pulmonary effort is normal.     Breath sounds: Normal breath sounds. No wheezing or rales.  Abdominal:     General: There is no distension.     Palpations: Abdomen is soft.     Tenderness: There is abdominal tenderness. There is no guarding.     Comments: Mild lower abdominal tenderness without guarding or rebound.   Genitourinary:    Comments: Rectal examination performed with RN chaperone present.  Decreased rectal tone initially, however patient is able to follow commands and produced rectal tone.  Soft brown stool present in rectal vault. Musculoskeletal: Normal range of motion.        General: Tenderness present. No deformity.     Comments: Patient has diffuse paraspinal muscular tenderness of thoracic and lumbar spine.  Lymphadenopathy:     Cervical: No cervical adenopathy.  Skin:    General: Skin is warm and dry.     Findings: No erythema or rash.  Neurological:     Mental Status: She is alert.     Comments: Cranial nerves grossly intact. Patient moves extremities symmetrically and with good coordination.  Psychiatric:        Behavior: Behavior normal.        Thought Content: Thought content normal.        Judgment: Judgment normal.      ED Treatments / Results  Labs (all labs ordered are listed, but only abnormal results are displayed) Labs Reviewed  COMPREHENSIVE METABOLIC PANEL - Abnormal; Notable for the following components:      Result Value   Sodium 134 (*)  Potassium 3.4 (*)    CO2 17 (*)    Glucose, Bld 186 (*)    Creatinine, Ser 1.67 (*)    AST 133 (*)    ALT 96 (*)    Total Bilirubin 1.5 (*)    GFR calc non Af Amer 33 (*)    GFR calc Af Amer 38 (*)    All other components  within normal limits  LACTIC ACID, PLASMA - Abnormal; Notable for the following components:   Lactic Acid, Venous 3.7 (*)    All other components within normal limits  CBC WITH DIFFERENTIAL/PLATELET - Abnormal; Notable for the following components:   RBC 5.20 (*)    Hemoglobin 15.9 (*)    HCT 46.3 (*)    Platelets 148 (*)    Neutro Abs 8.2 (*)    Lymphs Abs 0.5 (*)    All other components within normal limits  PROTIME-INR - Abnormal; Notable for the following components:   Prothrombin Time 22.7 (*)    INR 2.0 (*)    All other components within normal limits  CULTURE, BLOOD (ROUTINE X 2)  CULTURE, BLOOD (ROUTINE X 2)  SARS CORONAVIRUS 2 (HOSPITAL ORDER, [facility])  LACTIC ACID, PLASMA  URINALYSIS, ROUTINE W REFLEX MICROSCOPIC  TROPONIN I (HIGH SENSITIVITY)  TROPONIN I (HIGH SENSITIVITY)    EKG EKG Interpretation  Date/Time:  Tuesday January 17 2019 23:33:37 EDT Ventricular Rate:  119 PR Interval:    QRS Duration: 60 QT Interval:  272 QTC Calculation: 383 R Axis:   31 Text Interpretation:  Sinus tachycardia Abnormal R-wave progression, early transition Repol abnrm suggests ischemia, anterolateral Otherwise no significant change Confirmed by Addison Lank [phone]) on 01/17/2019 11:47:47 PM   Radiology Dg Chest 2 View  Result Date: 01/17/2019 CLINICAL DATA:  Suspected sepsis. Fever, back pain. History of hypertension, CAD, CABG. EXAM: CHEST - 2 VIEW COMPARISON:  Chest radiograph 05/28/2016 FINDINGS: Postsurgical changes from prior CABG and aortic valve replacement. Sternotomy wires remain intact and aligned. Cardiomediastinal contours are otherwise unremarkable. Streaky areas of subsegmental atelectasis at the bases. Lungs are otherwise clear. Multilevel discogenic and changes in the spine. Cholecystectomy clips in the right upper quadrant. No acute osseous or soft tissue abnormality. IMPRESSION: Bibasilar atelectasis.  No other acute cardiopulmonary  abnormality. Electronically Signed   By: MD Lovena Le   On: 01/17/2019 21:23    Procedures Procedures (including critical care time)  CRITICAL CARE Performed by: Albesa Seen   Total critical care time: 35 minutes  Critical care time was exclusive of separately billable procedures and treating other patients.  Critical care was necessary to treat or prevent imminent or life-threatening deterioration.  Critical care was time spent personally by me on the following activities: development of treatment plan with patient and/or surrogate as well as nursing, discussions with consultants, evaluation of patient's response to treatment, examination of patient, obtaining history from patient or surrogate, ordering and performing treatments and interventions, ordering and review of laboratory studies, ordering and review of radiographic studies, pulse oximetry and re-evaluation of patient's condition.   Medications Ordered in ED Medications  sodium chloride flush (NS) 0.9 % injection 3 mL (has no administration in time range)  lactated ringers bolus 1,000 mL (has no administration in time range)    And  lactated ringers bolus 1,000 mL (has no administration in time range)    And  lactated ringers bolus 250 mL (has no administration in time range)  metroNIDAZOLE (FLAGYL) IVPB 500  mg (has no administration in time range)  ceFEPIme (MAXIPIME) 2 g in sodium chloride 0.9 % 100 mL IVPB (has no administration in time range)  vancomycin (VANCOCIN) IVPB 1000 mg/200 mL premix (has no administration in time range)  acetaminophen (TYLENOL) tablet 650 mg (650 mg Oral Given 01/17/19 2353)     Initial Impression / Assessment and Plan / ED Course  I have reviewed the triage vital signs and the nursing notes.  Pertinent labs & imaging results that were available during my care of the patient were reviewed by me and considered in my medical decision making (see chart for details).  Clinical Course as  of Jan 18 36  Tue Jan 17, 2019  2306 Rectal temp 103.7. Code sepsis called. Possible pyelo given back pain. Also considering epidural abscess. Rectal tone present but diminished. Possibly due to generalized weakness.   [AM]  Wed Jan 18, 2019  0007 Spoke with Dr. Hal Hope who will come see pt but would like to order MRI T-spine/L-spine. Appreciate his involvement.    [AM]    Clinical Course User Index [AM] Albesa Seen, PA-C       Patient is critically ill and requiring a higher level of care. Sepsis suspected. Code sepsis called. Patient meeting SIRS criteria based on fever, tachycardia, tachypnea. See vitals below. Suspected source of infection possibly pyelonephritis versus other intra-abdominal versus spinal abscess based on exam and history. Lactic acid 3.7. Signs of end organ dysfunction include AKI, elevated liver enzymes.  Vitals:   01/17/19 2054 01/17/19 2104 01/17/19 2316 01/17/19 2316  BP: (!) 151/76 132/73  117/62  Pulse: (!) 130 (!) 133  (!) 128  Resp: 20 (!) 24  (!) 30  Temp: 99.2 F (37.3 C) 97.7 F (36.5 C)  (!) 103.7 F (39.8 C)  TempSrc: Oral Oral  Rectal  SpO2: 95% 94%    Weight:   69.9 kg   Height:   5\' 2"  (1.575 m)      Broad spectrum antibiotics initiated based on suspected source of infection. 30 cc/kilo fluid bolus administered due to lactic acidosis.  Obtaining CT abdomen and pelvis without contrast and awaiting urinalysis.  Patient's examination is not entirely consistent with spinal epidural abscess.   Patient is not a candidate for LP at this time should all other infectious etiologies be ruled out due to warfarin anticoagulation. INR 2.0 today.  Patient's family does not have her St. Jude mechanical heart valve card. This was obtained from the operative note on her valve replacement on 03/25/2005: "This had model number [id number] model number [id number]."  Dr. Addison Lank to follow up on imaging and proceed with admission.  Final Clinical  Impressions(s) / ED Diagnoses   Final diagnoses:  Sepsis with encephalopathy and septic shock, due to unspecified organism [facility])  Fever in adult    ED Discharge Orders    None       Tamala Julian 01/18/19 0103    Fatima Blank, MD 01/18/19 [id number]

## 2019-01-18 ENCOUNTER — Inpatient Hospital Stay (HOSPITAL_COMMUNITY): Payer: 59

## 2019-01-18 ENCOUNTER — Emergency Department (HOSPITAL_COMMUNITY): Payer: 59

## 2019-01-18 ENCOUNTER — Encounter (HOSPITAL_COMMUNITY): Payer: Self-pay | Admitting: Internal Medicine

## 2019-01-18 DIAGNOSIS — R4182 Altered mental status, unspecified: Secondary | ICD-10-CM | POA: Diagnosis present

## 2019-01-18 DIAGNOSIS — N179 Acute kidney failure, unspecified: Secondary | ICD-10-CM | POA: Diagnosis present

## 2019-01-18 DIAGNOSIS — Z8249 Family history of ischemic heart disease and other diseases of the circulatory system: Secondary | ICD-10-CM | POA: Diagnosis not present

## 2019-01-18 DIAGNOSIS — E871 Hypo-osmolality and hyponatremia: Secondary | ICD-10-CM | POA: Diagnosis present

## 2019-01-18 DIAGNOSIS — R739 Hyperglycemia, unspecified: Secondary | ICD-10-CM | POA: Diagnosis present

## 2019-01-18 DIAGNOSIS — R011 Cardiac murmur, unspecified: Secondary | ICD-10-CM | POA: Diagnosis not present

## 2019-01-18 DIAGNOSIS — R7881 Bacteremia: Secondary | ICD-10-CM

## 2019-01-18 DIAGNOSIS — Z951 Presence of aortocoronary bypass graft: Secondary | ICD-10-CM

## 2019-01-18 DIAGNOSIS — R6521 Severe sepsis with septic shock: Secondary | ICD-10-CM | POA: Diagnosis present

## 2019-01-18 DIAGNOSIS — A419 Sepsis, unspecified organism: Secondary | ICD-10-CM | POA: Diagnosis not present

## 2019-01-18 DIAGNOSIS — Z7901 Long term (current) use of anticoagulants: Secondary | ICD-10-CM | POA: Diagnosis not present

## 2019-01-18 DIAGNOSIS — Z8349 Family history of other endocrine, nutritional and metabolic diseases: Secondary | ICD-10-CM | POA: Diagnosis not present

## 2019-01-18 DIAGNOSIS — A4101 Sepsis due to Methicillin susceptible Staphylococcus aureus: Secondary | ICD-10-CM | POA: Diagnosis present

## 2019-01-18 DIAGNOSIS — Z8619 Personal history of other infectious and parasitic diseases: Secondary | ICD-10-CM

## 2019-01-18 DIAGNOSIS — I350 Nonrheumatic aortic (valve) stenosis: Secondary | ICD-10-CM | POA: Diagnosis not present

## 2019-01-18 DIAGNOSIS — Z8673 Personal history of transient ischemic attack (TIA), and cerebral infarction without residual deficits: Secondary | ICD-10-CM | POA: Diagnosis not present

## 2019-01-18 DIAGNOSIS — E876 Hypokalemia: Secondary | ICD-10-CM | POA: Diagnosis present

## 2019-01-18 DIAGNOSIS — B9561 Methicillin susceptible Staphylococcus aureus infection as the cause of diseases classified elsewhere: Secondary | ICD-10-CM

## 2019-01-18 DIAGNOSIS — K219 Gastro-esophageal reflux disease without esophagitis: Secondary | ICD-10-CM | POA: Diagnosis present

## 2019-01-18 DIAGNOSIS — Z87891 Personal history of nicotine dependence: Secondary | ICD-10-CM | POA: Diagnosis not present

## 2019-01-18 DIAGNOSIS — Z981 Arthrodesis status: Secondary | ICD-10-CM | POA: Diagnosis not present

## 2019-01-18 DIAGNOSIS — Z952 Presence of prosthetic heart valve: Secondary | ICD-10-CM | POA: Diagnosis not present

## 2019-01-18 DIAGNOSIS — Z811 Family history of alcohol abuse and dependence: Secondary | ICD-10-CM | POA: Diagnosis not present

## 2019-01-18 DIAGNOSIS — I10 Essential (primary) hypertension: Secondary | ICD-10-CM | POA: Diagnosis present

## 2019-01-18 DIAGNOSIS — E785 Hyperlipidemia, unspecified: Secondary | ICD-10-CM | POA: Diagnosis present

## 2019-01-18 DIAGNOSIS — Z823 Family history of stroke: Secondary | ICD-10-CM | POA: Diagnosis not present

## 2019-01-18 DIAGNOSIS — Z1159 Encounter for screening for other viral diseases: Secondary | ICD-10-CM | POA: Diagnosis not present

## 2019-01-18 DIAGNOSIS — I2510 Atherosclerotic heart disease of native coronary artery without angina pectoris: Secondary | ICD-10-CM | POA: Diagnosis present

## 2019-01-18 DIAGNOSIS — E039 Hypothyroidism, unspecified: Secondary | ICD-10-CM | POA: Diagnosis present

## 2019-01-18 DIAGNOSIS — I340 Nonrheumatic mitral (valve) insufficiency: Secondary | ICD-10-CM | POA: Diagnosis not present

## 2019-01-18 DIAGNOSIS — D696 Thrombocytopenia, unspecified: Secondary | ICD-10-CM

## 2019-01-18 DIAGNOSIS — Z79890 Hormone replacement therapy (postmenopausal): Secondary | ICD-10-CM | POA: Diagnosis not present

## 2019-01-18 DIAGNOSIS — I739 Peripheral vascular disease, unspecified: Secondary | ICD-10-CM | POA: Diagnosis present

## 2019-01-18 LAB — COMPREHENSIVE METABOLIC PANEL
ALT: 68 U/L — ABNORMAL HIGH (ref 0–44)
AST: 88 U/L — ABNORMAL HIGH (ref 15–41)
Albumin: 3.1 g/dL — ABNORMAL LOW (ref 3.5–5.0)
Alkaline Phosphatase: 80 U/L (ref 38–126)
Anion gap: 12 (ref 5–15)
BUN: 21 mg/dL (ref 8–23)
CO2: 19 mmol/L — ABNORMAL LOW (ref 22–32)
Calcium: 8.5 mg/dL — ABNORMAL LOW (ref 8.9–10.3)
Chloride: 105 mmol/L (ref 98–111)
Creatinine, Ser: 1.75 mg/dL — ABNORMAL HIGH (ref 0.44–1.00)
GFR calc Af Amer: 36 mL/min — ABNORMAL LOW (ref >60)
GFR calc non Af Amer: 31 mL/min — ABNORMAL LOW (ref >60)
Glucose, Bld: 128 mg/dL — ABNORMAL HIGH (ref 70–99)
Potassium: 3.5 mmol/L (ref 3.5–5.1)
Sodium: 136 mmol/L (ref 135–145)
Total Bilirubin: 0.9 mg/dL (ref 0.3–1.2)
Total Protein: 6.3 g/dL — ABNORMAL LOW (ref 6.5–8.1)

## 2019-01-18 LAB — BLOOD CULTURE ID PANEL (REFLEXED)

## 2019-01-18 LAB — CBC WITH DIFFERENTIAL/PLATELET
Abs Immature Granulocytes: 0.05 10*3/uL (ref 0.00–0.07)
Basophils Absolute: 0 10*3/uL (ref 0.0–0.1)
Basophils Relative: 0 %
Eosinophils Absolute: 0 10*3/uL (ref 0.0–0.5)
Eosinophils Relative: 0 %
HCT: 42.7 % (ref 36.0–46.0)
Hemoglobin: 14.6 g/dL (ref 12.0–15.0)
Immature Granulocytes: 1 %
Lymphocytes Relative: 8 %
Lymphs Abs: 0.6 10*3/uL — ABNORMAL LOW (ref 0.7–4.0)
MCH: 30.5 pg (ref 26.0–34.0)
MCHC: 34.2 g/dL (ref 30.0–36.0)
MCV: 89.3 fL (ref 80.0–100.0)
Monocytes Absolute: 0.2 10*3/uL (ref 0.1–1.0)
Monocytes Relative: 2 %
Neutro Abs: 6.2 10*3/uL (ref 1.7–7.7)
Neutrophils Relative %: 89 %
Platelets: 111 10*3/uL — ABNORMAL LOW (ref 150–400)
RBC: 4.78 MIL/uL (ref 3.87–5.11)
RDW: 13.1 % (ref 11.5–15.5)
WBC: 7 10*3/uL (ref 4.0–10.5)
nRBC: 0 % (ref 0.0–0.2)

## 2019-01-18 LAB — PROTIME-INR
INR: 2 — ABNORMAL HIGH (ref 0.8–1.2)
Prothrombin Time: 22.6 seconds — ABNORMAL HIGH (ref 11.4–15.2)

## 2019-01-18 LAB — LACTIC ACID, PLASMA
Lactic Acid, Venous: 1.5 mmol/L (ref 0.5–1.9)
Lactic Acid, Venous: 2.4 mmol/L (ref 0.5–1.9)
Lactic Acid, Venous: 2.9 mmol/L (ref 0.5–1.9)
Lactic Acid, Venous: 3.3 mmol/L (ref 0.5–1.9)

## 2019-01-18 LAB — BASIC METABOLIC PANEL
Anion gap: 10 (ref 5–15)
BUN: 19 mg/dL (ref 8–23)
CO2: 18 mmol/L — ABNORMAL LOW (ref 22–32)
Calcium: 8.1 mg/dL — ABNORMAL LOW (ref 8.9–10.3)
Chloride: 110 mmol/L (ref 98–111)
Creatinine, Ser: 1.42 mg/dL — ABNORMAL HIGH (ref 0.44–1.00)
GFR calc Af Amer: 46 mL/min — ABNORMAL LOW (ref >60)
GFR calc non Af Amer: 40 mL/min — ABNORMAL LOW (ref >60)
Glucose, Bld: 130 mg/dL — ABNORMAL HIGH (ref 70–99)
Potassium: 3.2 mmol/L — ABNORMAL LOW (ref 3.5–5.1)
Sodium: 138 mmol/L (ref 135–145)

## 2019-01-18 LAB — ECHOCARDIOGRAM COMPLETE
Height: 63 in
Weight: 2553.81 oz

## 2019-01-18 LAB — TROPONIN I (HIGH SENSITIVITY)
Troponin I (High Sensitivity): 535 ng/L (ref <18)
Troponin I (High Sensitivity): 679 ng/L (ref <18)

## 2019-01-18 LAB — APTT: aPTT: 45 seconds — ABNORMAL HIGH (ref 24–36)

## 2019-01-18 LAB — SARS CORONAVIRUS 2 BY RT PCR (HOSPITAL ORDER, PERFORMED IN ~~LOC~~ HOSPITAL LAB): SARS Coronavirus 2: NEGATIVE

## 2019-01-18 LAB — PROCALCITONIN: Procalcitonin: 108.03 ng/mL

## 2019-01-18 LAB — HEMOGLOBIN A1C
Hgb A1c MFr Bld: 5.4 % (ref 4.8–5.6)
Mean Plasma Glucose: 108.28 mg/dL

## 2019-01-18 MED ORDER — ONDANSETRON HCL 4 MG/2ML IJ SOLN: 4.0000 mg | Freq: Four times a day (QID) | INTRAMUSCULAR | Status: DC | PRN

## 2019-01-18 MED ORDER — VANCOMYCIN HCL IN DEXTROSE 750-5 MG/150ML-% IV SOLN: 750.0000 mg | INTRAVENOUS | Status: DC

## 2019-01-18 MED ORDER — ONDANSETRON HCL 4 MG PO TABS: 4.0000 mg | ORAL_TABLET | Freq: Four times a day (QID) | ORAL | Status: DC | PRN

## 2019-01-18 MED ORDER — WARFARIN SODIUM 4 MG PO TABS
4.5000 mg | ORAL_TABLET | Freq: Once | ORAL | Status: AC
  Administered 2019-01-18: 4.5 mg via ORAL
  Filled 2019-01-18: qty 1

## 2019-01-18 MED ORDER — PANTOPRAZOLE SODIUM 40 MG PO TBEC
40.0000 mg | DELAYED_RELEASE_TABLET | Freq: Every day | ORAL | Status: DC
  Administered 2019-01-18 – 2019-01-21 (×4): 40 mg via ORAL
  Filled 2019-01-18 (×4): qty 1

## 2019-01-18 MED ORDER — WARFARIN - PHARMACIST DOSING INPATIENT: Freq: Every day | Status: DC

## 2019-01-18 MED ORDER — POTASSIUM CHLORIDE CRYS ER 20 MEQ PO TBCR
40.0000 meq | EXTENDED_RELEASE_TABLET | Freq: Once | ORAL | Status: AC
  Administered 2019-01-18: 40 meq via ORAL
  Filled 2019-01-18: qty 2

## 2019-01-18 MED ORDER — LEVOTHYROXINE SODIUM 50 MCG PO TABS
50.0000 ug | ORAL_TABLET | Freq: Every day | ORAL | Status: DC
  Administered 2019-01-18 – 2019-01-21 (×4): 50 ug via ORAL
  Filled 2019-01-18 (×4): qty 1

## 2019-01-18 MED ORDER — GADOBUTROL 1 MMOL/ML IV SOLN
7.0000 mL | Freq: Once | INTRAVENOUS | Status: AC | PRN
  Administered 2019-01-18: 7 mL via INTRAVENOUS

## 2019-01-18 MED ORDER — SODIUM CHLORIDE 0.9 % IV SOLN
INTRAVENOUS | Status: DC
  Administered 2019-01-18 – 2019-01-19 (×2): via INTRAVENOUS

## 2019-01-18 MED ORDER — SODIUM CHLORIDE 0.9 % IV BOLUS
500.0000 mL | Freq: Once | INTRAVENOUS | Status: AC
  Administered 2019-01-18: 500 mL via INTRAVENOUS

## 2019-01-18 MED ORDER — SIMVASTATIN 20 MG PO TABS
40.0000 mg | ORAL_TABLET | Freq: Every day | ORAL | Status: DC
  Administered 2019-01-18 – 2019-01-20 (×3): 40 mg via ORAL
  Filled 2019-01-18 (×3): qty 2

## 2019-01-18 MED ORDER — SODIUM CHLORIDE 0.9 % IV SOLN
2.0000 g | Freq: Two times a day (BID) | INTRAVENOUS | Status: DC
  Administered 2019-01-18: 2 g via INTRAVENOUS
  Filled 2019-01-18: qty 2

## 2019-01-18 MED ORDER — CEFAZOLIN SODIUM-DEXTROSE 2-4 GM/100ML-% IV SOLN
2.0000 g | Freq: Three times a day (TID) | INTRAVENOUS | Status: DC
  Administered 2019-01-18 – 2019-01-21 (×9): 2 g via INTRAVENOUS
  Filled 2019-01-18 (×11): qty 100

## 2019-01-18 MED ORDER — ACETAMINOPHEN 650 MG RE SUPP: 650.0000 mg | Freq: Four times a day (QID) | RECTAL | Status: DC | PRN

## 2019-01-18 MED ORDER — LACTATED RINGERS IV SOLN
INTRAVENOUS | Status: DC
  Administered 2019-01-18: 06:00:00 via INTRAVENOUS

## 2019-01-18 MED ORDER — METRONIDAZOLE IN NACL 5-0.79 MG/ML-% IV SOLN: 500.0000 mg | Freq: Three times a day (TID) | INTRAVENOUS | Status: DC

## 2019-01-18 MED ORDER — ACETAMINOPHEN 325 MG PO TABS: 650.0000 mg | ORAL_TABLET | Freq: Four times a day (QID) | ORAL | Status: DC | PRN

## 2019-01-18 NOTE — ED Notes (Signed)
ED TO INPATIENT HANDOFF REPORT  ED Nurse Name and Phone #: Tray [place], [phone]  S Name/Age/Gender Elizabeth Morton 63 y.o. female Room/Bed: 027C/027C  Code Status   Code Status: Prior  Home/SNF/Other Home Patient oriented to: self and place Is this baseline? No   Triage Complete: Triage complete  Chief Complaint FEVER, DISORIENTED AND WEAKNESS  Triage Note Pt presents with her spouse. Spouse reporting fever last night and c/o back pain. She is disoriented today and incontinent of stool and urine. She also has been weak and not eating today> Pt is oriented to self.   Allergies No Known Allergies  Level of Care/Admitting Diagnosis ED Disposition    ED Disposition Condition [facility] Area: [place] [100100]  Level of Care: Progressive [102]  Covid Evaluation: Person Under Investigation (PUI)  Diagnosis: Sepsis [facility]) [[id number]]  Admitting Physician: Rise Patience [phone]  Attending Physician: Rise Patience [phone]  Estimated length of stay: past midnight tomorrow  Certification:: I certify this patient will need inpatient services for at least 2 midnights  PT Class (Do Not Modify): Inpatient [101]  PT Acc Code (Do Not Modify): Private [1]       B Medical/Surgery History Past Medical History:  Diagnosis Date  . Aortic stenosis    Aortic valve replacement 2006  . CAD (coronary artery disease)    a. s/p CABG 2006 at time of AVR.  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 Dyslipidemia   . Gallstones   . GERD (gastroesophageal reflux disease)   . Heart murmur   . Hx of CABG    2006,LIMA to LAD, SVG to diagonal  . Hx of transfusion of packed red blood cells   . Hypertension   . Hypothyroidism   . Iron deficiency anemia   . Lower GI bleed 06/2015  . Pericardial effusion    Insignificant small pericardial effusion seen on echo, November, 2013  . Peripheral vascular disease ([place])  06   blood clot -stroke  . S/P aortic valve replacement    a. [place] procedure,#21 St. Jude mechanical valve conduit with reimplantation of the coronaries 2006  . Status post colonoscopy with polypectomy    "bleeding; colonoscopy was ~ 06/18/2015"  . Stroke [facility]) 2006   no deficits   . Warfarin anticoagulation    coumadin therapy   Past Surgical History:  Procedure Laterality Date  . AORTIC VALVE REPLACEMENT  2006  . BACK SURGERY    . CARDIAC CATHETERIZATION  ~ 2006  . CARDIAC VALVE REPLACEMENT    . CESAREAN SECTION  1985  . COLONOSCOPY N/A 06/26/2015   Procedure: COLONOSCOPY;  Surgeon: Gatha Mayer, MD;  Location: [facility];  Service: Endoscopy;  Laterality: N/A;  . COLONOSCOPY W/ BIOPSIES AND POLYPECTOMY  06/18/2015  . CORONARY ARTERY BYPASS GRAFT  2006  . ESOPHAGOGASTRODUODENOSCOPY  06/18/2015  . INSERTION OF MESH  05/15/2016   Procedure: INSERTION OF MESH;  Surgeon: Johnathan Hausen, MD;  Location: [facility];  Service: General;;  . LAPAROSCOPIC ASSISTED VENTRAL HERNIA REPAIR N/A 05/15/2016   Procedure: LAPAROSCOPIC ASSISTED VENTRAL HERNIA REPAIR WITH MESH;  Surgeon: Johnathan Hausen, MD;  Location: [facility];  Service: General;  Laterality: N/A;  . LAPAROSCOPIC CHOLECYSTECTOMY SINGLE PORT  07/21/2012   Procedure: LAPAROSCOPIC CHOLECYSTECTOMY SINGLE PORT;  Surgeon: Adin Hector, MD;  Location: [facility];  Service: General;  Laterality: N/A;  . LUMBAR FUSION  ~ 1973  .  TONSILLECTOMY       A IV Location/Drains/Wounds Patient Lines/Drains/Airways Status   Active Line/Drains/Airways    Name:   Placement date:   Placement time:   Site:   Days:   Peripheral IV 01/18/19 Left Forearm   01/18/19    0100    Forearm   less than 1   Peripheral IV 01/18/19 Left Wrist   01/18/19    0100    Wrist   less than 1   Peripheral IV 01/18/19 Left Antecubital   01/18/19    0109    Antecubital   less than 1   Incision (Closed) 05/15/16 Abdomen   05/15/16    1548     978   Incision - 1 Port Abdomen 1:  Umbilicus   52/84/13    -     2372          Intake/Output Last 24 hours  Intake/Output Summary (Last 24 hours) at 01/18/2019 0221 Last data filed at 01/18/2019 0210 Gross per 24 hour  Intake 1192.03 ml  Output -  Net 1192.03 ml    Labs/Imaging Results for orders placed or performed during the hospital encounter of 01/17/19 (from the past 48 hour(s))  Comprehensive metabolic panel     Status: Abnormal   Collection Time: 01/17/19  9:07 PM  Result Value Ref Range   Sodium 134 (L) 135 - 145 mmol/L   Potassium 3.4 (L) 3.5 - 5.1 mmol/L   Chloride 103 98 - 111 mmol/L   CO2 17 (L) 22 - 32 mmol/L   Glucose, Bld 186 (H) 70 - 99 mg/dL   BUN 17 8 - 23 mg/dL   Creatinine, Ser 1.67 (H) 0.44 - 1.00 mg/dL   Calcium 9.3 8.9 - 10.3 mg/dL   Total Protein 7.6 6.5 - 8.1 g/dL   Albumin 3.9 3.5 - 5.0 g/dL   AST 133 (H) 15 - 41 U/L   ALT 96 (H) 0 - 44 U/L   Alkaline Phosphatase 102 38 - 126 U/L   Total Bilirubin 1.5 (H) 0.3 - 1.2 mg/dL   GFR calc non Af Amer 33 (L) >60 mL/min   GFR calc Af Amer 38 (L) >60 mL/min   Anion gap 14 5 - 15    Comment: Performed at [facility], [address] [place], [place] [address]  CBC with Differential     Status: Abnormal   Collection Time: 01/17/19  9:07 PM  Result Value Ref Range   WBC 9.0 4.0 - 10.5 K/uL   RBC 5.20 (H) 3.87 - 5.11 MIL/uL   Hemoglobin 15.9 (H) 12.0 - 15.0 g/dL   HCT 46.3 (H) 36.0 - 46.0 %   MCV 89.0 80.0 - 100.0 fL   MCH 30.6 26.0 - 34.0 pg   MCHC 34.3 30.0 - 36.0 g/dL   RDW 13.0 11.5 - 15.5 %   Platelets 148 (L) 150 - 400 K/uL   nRBC 0.0 0.0 - 0.2 %   Neutrophils Relative % 91 %   Neutro Abs 8.2 (H) 1.7 - 7.7 K/uL   Lymphocytes Relative 5 %   Lymphs Abs 0.5 (L) 0.7 - 4.0 K/uL   Monocytes Relative 3 %   Monocytes Absolute 0.3 0.1 - 1.0 K/uL   Eosinophils Relative 0 %   Eosinophils Absolute 0.0 0.0 - 0.5 K/uL   Basophils Relative 0 %   Basophils Absolute 0.0 0.0 - 0.1 K/uL   WBC Morphology VACUOLATED NEUTROPHILS     Immature Granulocytes 1 %  Abs Immature Granulocytes 0.07 0.00 - 0.07 K/uL    Comment: Performed at [facility], [place] [address], [place], [place] [address]  Protime-INR     Status: Abnormal   Collection Time: 01/17/19  9:07 PM  Result Value Ref Range   Prothrombin Time 22.7 (H) 11.4 - 15.2 seconds   INR 2.0 (H) 0.8 - 1.2    Comment: (NOTE) INR goal varies based on device and disease states. Performed at [facility], [place] [address] [place], [place] [address]   Lactic acid, plasma     Status: Abnormal   Collection Time: 01/17/19  9:08 PM  Result Value Ref Range   Lactic Acid, Venous 3.7 (HH) 0.5 - 1.9 mmol/L    Comment: CRITICAL RESULT CALLED TO, READ BACK BY AND VERIFIED WITH: S.CREWS,RN 2144 01/17/2019 M.CAMPBELL Performed at [facility], [place] [address] [place], [place] [address]   Urinalysis, Routine w reflex microscopic     Status: Abnormal   Collection Time: 01/17/19 11:13 PM  Result Value Ref Range   Color, Urine AMBER (A) YELLOW    Comment: BIOCHEMICALS MAY BE AFFECTED BY COLOR   APPearance HAZY (A) CLEAR   Specific Gravity, Urine 1.018 1.005 - 1.030   pH 7.0 5.0 - 8.0   Glucose, UA NEGATIVE NEGATIVE mg/dL   Hgb urine dipstick LARGE (A) NEGATIVE   Bilirubin Urine NEGATIVE NEGATIVE   Ketones, ur NEGATIVE NEGATIVE mg/dL   Protein, ur >=300 (A) NEGATIVE mg/dL   Nitrite NEGATIVE NEGATIVE   Leukocytes,Ua NEGATIVE NEGATIVE   RBC / HPF 21-50 0 - 5 RBC/hpf   WBC, UA 0-5 0 - 5 WBC/hpf   Bacteria, UA RARE (A) NONE SEEN   Squamous Epithelial / LPF 0-5 0 - 5   Mucus PRESENT     Comment: Performed at [facility], [address], [place], [place] [address]  Lactic acid, plasma     Status: Abnormal   Collection Time: 01/18/19 12:04 AM  Result Value Ref Range   Lactic Acid, Venous 3.3 (HH) 0.5 - 1.9 mmol/L    Comment: CRITICAL RESULT CALLED TO, READ BACK BY AND VERIFIED WITH: T.Shamari Lofquist,RN 3710 01/18/2019 M.CAMPBELL Performed at [facility], [place] [address], [place], [place] [address]   Troponin I (High Sensitivity)     Status: Abnormal   Collection Time: 01/18/19 12:04 AM  Result Value Ref Range   Troponin I (High Sensitivity) 535 (HH) <18 ng/L    Comment: CRITICAL RESULT CALLED TO, READ BACK BY AND VERIFIED WITH: T.Piccola Arico,RN 0113 01/18/2019 M.CAMPBELL (NOTE) Elevated high sensitivity troponin I (hsTnI) values and significant  changes across serial measurements may suggest ACS but many other  chronic and acute conditions are known to elevate hsTnI results.  Refer to the Links section for chest pain algorithms and additional  guidance. Performed at [facility], [place] [address] [place], [place] [address]    Ct Abdomen Pelvis Wo Contrast  Result Date: 01/18/2019 CLINICAL DATA:  Fever and back pain EXAM: CT ABDOMEN AND PELVIS WITHOUT CONTRAST TECHNIQUE: Multidetector CT imaging of the abdomen and pelvis was performed following the standard protocol without IV contrast. COMPARISON:  CT dated May 28, 2016. FINDINGS: Lower chest: The lung bases are clear. The heart size is normal. Hepatobiliary: There is likely borderline hepatic steatosis. Status post cholecystectomy.There is no biliary ductal dilation. Pancreas: Normal contours without ductal dilatation. No peripancreatic fluid collection. Spleen: No splenic laceration or hematoma. Adrenals/Urinary Tract: --Adrenal  glands: No adrenal hemorrhage. --Right kidney/ureter: No hydronephrosis or perinephric hematoma. --Left kidney/ureter: There is some fat stranding about the left kidney which is slightly increased from prior study. There may be punctate nonobstructing stones in the interpolar region of the left kidney. There is no left-sided hydronephrosis. --Urinary bladder: Unremarkable. Stomach/Bowel: --Stomach/Duodenum: There is a moderate-sized hiatal hernia. --Small bowel: No dilatation or inflammation. --Colon: The colon is decompressed which limits evaluation. There is no  definite CT evidence for colitis. --Appendix: Normal. Vascular/Lymphatic: Atherosclerotic calcification is present within the non-aneurysmal abdominal aorta, without hemodynamically significant stenosis. --No retroperitoneal lymphadenopathy. --No mesenteric lymphadenopathy. --No pelvic or inguinal lymphadenopathy. Reproductive: Unremarkable Other: No ascites or free air. There is a small fat containing ventral wall hernia. Musculoskeletal. Multilevel degenerative disc disease and facet arthrosis. No bony spinal canal stenosis. IMPRESSION: 1. Slight interval increase in fat stranding about the left kidney without a clear identifiable cause. There is no left-sided hydronephrosis. There is a punctate nonobstructing stone in the interpolar region of the left kidney. Correlation with urinalysis is recommended to help exclude an ascending urinary tract infection. 2. Multiple chronic findings as above. Electronically Signed   By: Constance Holster M.D.   On: 01/18/2019 00:53   Dg Chest 2 View  Result Date: 01/17/2019 CLINICAL DATA:  Suspected sepsis. Fever, back pain. History of hypertension, CAD, CABG. EXAM: CHEST - 2 VIEW COMPARISON:  Chest radiograph 05/28/2016 FINDINGS: Postsurgical changes from prior CABG and aortic valve replacement. Sternotomy wires remain intact and aligned. Cardiomediastinal contours are otherwise unremarkable. Streaky areas of subsegmental atelectasis at the bases. Lungs are otherwise clear. Multilevel discogenic and changes in the spine. Cholecystectomy clips in the right upper quadrant. No acute osseous or soft tissue abnormality. IMPRESSION: Bibasilar atelectasis.  No other acute cardiopulmonary abnormality. Electronically Signed   By: MD Lovena Le   On: 01/17/2019 21:23   Ct Head Wo Contrast  Result Date: 01/18/2019 CLINICAL DATA:  Altered level of consciousness. EXAM: CT HEAD WITHOUT CONTRAST TECHNIQUE: Contiguous axial images were obtained from the base of the skull through the  vertex without intravenous contrast. COMPARISON:  None. FINDINGS: Brain: No evidence of acute infarction, hemorrhage, hydrocephalus, extra-axial collection or mass lesion/mass effect. There is an old right MCA territory infarct. There is volume loss with chronic microvascular ischemic changes. Vascular: No hyperdense vessel or unexpected calcification. Skull: Normal. Negative for fracture or focal lesion. Sinuses/Orbits: No acute finding. Other: None. IMPRESSION: 1. No acute intracranial abnormality. 2. Old right MCA territory infarct. Electronically Signed   By: Constance Holster M.D.   On: 01/18/2019 00:58    Pending Labs Unresulted Labs (From admission, onward)    Start     Ordered   01/17/19 2336  Troponin I (High Sensitivity)  STAT Now then every 2 hours,   STAT     01/17/19 2335   01/17/19 2308  SARS Coronavirus 2 (CEPHEID- Performed in [place] hospital lab), Hosp Order  (Symptomatic Patients Labs with Precautions )  Once,   STAT     01/17/19 2307   01/17/19 2103  Culture, blood (Routine x 2)  BLOOD CULTURE X 2,   STAT     01/17/19 2103          Vitals/Pain Today's Vitals   01/17/19 2316 01/17/19 2316 01/17/19 2330 01/17/19 2345  BP:  117/62 127/64 114/67  Pulse:  (!) 128 (!) 120 (!) 126  Resp:  (!) 30    Temp:  (!) 103.7 F (39.8 C)    TempSrc:  Rectal    SpO2:   95% 95%  Weight: 69.9 kg     Height: 5\' 2"  (1.575 m)       Isolation Precautions Droplet and Contact precautions  Medications Medications  sodium chloride flush (NS) 0.9 % injection 3 mL (has no administration in time range)  vancomycin (VANCOCIN) IVPB 1000 mg/200 mL premix (1,000 mg Intravenous New Bag/Given 01/18/19 0137)  lactated ringers bolus 1,000 mL (1,000 mLs Intravenous New Bag/Given 01/18/19 0101)    And  lactated ringers bolus 1,000 mL (0 mLs Intravenous Stopped 01/18/19 0207)    And  lactated ringers bolus 250 mL (250 mLs Intravenous New Bag/Given 01/18/19 0209)  metroNIDAZOLE (FLAGYL) IVPB 500 mg  (0 mg Intravenous Stopped 01/18/19 0210)  acetaminophen (TYLENOL) tablet 650 mg (650 mg Oral Given 01/17/19 2353)  ceFEPIme (MAXIPIME) 2 g in sodium chloride 0.9 % 100 mL IVPB (0 g Intravenous Stopped 01/18/19 0132)    Mobility walks with device High fall risk   Focused Assessments Neuro Assessment Handoff:  Swallow screen pass? Yes  Cardiac Rhythm: Sinus tachycardia       Neuro Assessment: Exceptions to WDL Neuro Checks:      Last Documented NIHSS Modified Score:   Has TPA been given? No If patient is a Neuro Trauma and patient is going to OR before floor call report to [place] nurse: [phone] or [phone]     R Recommendations: See Admitting Provider Note  Report given to:   Additional Notes:

## 2019-01-18 NOTE — Progress Notes (Signed)
PHARMACY - PHYSICIAN COMMUNICATION CRITICAL VALUE ALERT - BLOOD CULTURE IDENTIFICATION (BCID)  Elizabeth Morton is an 63 y.o. female who presented to [facility] on [facility]/17/2019 with a chief complaint of confusion, fever, and chills. Febrile with elevated LA in the ED, but initial work up for infectious source negative. Started on antibiotics for sepsis of unknown source. 3/3 BCx now growing GPCs, BCID MSSA.  Name of physician (or Provider) Contacted: Adhikari  Current antibiotics: Vancomycin, cefepime, metronidazole  Changes to prescribed antibiotics recommended:  D/c current antibiotics and start cefazolin  Results for orders placed or performed during the hospital encounter of 01/17/19  Blood Culture ID Panel (Reflexed) (Collected: 01/17/2019  9:08 PM)  Result Value Ref Range   Enterococcus species NOT DETECTED NOT DETECTED   Listeria monocytogenes NOT DETECTED NOT DETECTED   Staphylococcus species DETECTED (A) NOT DETECTED   Staphylococcus aureus (BCID) DETECTED (A) NOT DETECTED   Methicillin resistance NOT DETECTED NOT DETECTED   Streptococcus species NOT DETECTED NOT DETECTED   Streptococcus agalactiae NOT DETECTED NOT DETECTED   Streptococcus pneumoniae NOT DETECTED NOT DETECTED   Streptococcus pyogenes NOT DETECTED NOT DETECTED   Acinetobacter baumannii NOT DETECTED NOT DETECTED   Enterobacteriaceae species NOT DETECTED NOT DETECTED   Enterobacter cloacae complex NOT DETECTED NOT DETECTED   Escherichia coli NOT DETECTED NOT DETECTED   Klebsiella oxytoca NOT DETECTED NOT DETECTED   Klebsiella pneumoniae NOT DETECTED NOT DETECTED   Proteus species NOT DETECTED NOT DETECTED   Serratia marcescens NOT DETECTED NOT DETECTED   Haemophilus influenzae NOT DETECTED NOT DETECTED   Neisseria meningitidis NOT DETECTED NOT DETECTED   Pseudomonas aeruginosa NOT DETECTED NOT DETECTED   Candida albicans NOT DETECTED NOT DETECTED   Candida glabrata NOT DETECTED NOT DETECTED   Candida krusei NOT  DETECTED NOT DETECTED   Candida parapsilosis NOT DETECTED NOT DETECTED   Candida tropicalis NOT DETECTED NOT DETECTED    N. Gerarda Fraction, PharmD, [place] PGY2 Infectious Diseases Pharmacy Resident Phone: [phone] 01/18/2019  10:15 AM

## 2019-01-18 NOTE — Progress Notes (Signed)
Two episodes of stool incontinence overnight.   Upon shift assessment patient incontinent of stool, unaware had passed stool. Reminded patient of purwick when asked if she could go to bathroom. Patient indicates no pain and feels alright, however, appears fatigued. Patient then stated just doesn't feel well. HR elevates to 110's and 120's with arousal and activity (moving in bed), however, returns to 80's and 90's with rest. Will continue to monitor.   During reassessment at 0055 observed patient sleeping, however, bed soiled with stool. Upon arousing patient, informed patient she was soiled and patient inquired about reason for incontinence. Informed day RN of incontinence.

## 2019-01-18 NOTE — H&P (Addendum)
History and Physical    Elizabeth Morton KPT:[id number] DOB: [birth year] DOA: 01/17/2019  PCP: Levin Erp, MD  Patient coming from: Home.  History obtained from patient's husband.  Chief Complaint: Confusion and fever.  HPI: Elizabeth Morton is a 63 y.o. female with history of CAD status post CABG, mechanical aortic valve, hypothyroidism, CVA and hypertension was brought to the ER after patient was found to be increasingly confused with fever and chills.  Patient symptoms were ongoing from January 16, 2019 evening.  Has not had any nausea vomiting abdominal pain chest pain or shortness of breath or productive cough.  On the evening of January 26, 2019 patient became febrile and became confused.  Then yesterday patient had at least 2 episodes of stools which were loose with notable to control.  Patient was feeling weak but able to move all extremities.  Became more confused by evening.  Was brought to the ER.  No change in medications recently.  By evening patient started complaining of low mid back pain.  ED Course: In the ER patient is tachycardic febrile with 103 F and on exam patient is not tender on the spine able to move all extremities oriented to her name and place no obvious rash or abdominal tenderness or any breathing difficulties.  CT head is unremarkable CT abdomen shows nonspecific stranding around the left kidney with no obstruction.  UA shows large amount of RBCs but otherwise negative for WBCs.  Chest x-ray unremarkable.  COVID-19 test was negative.  Lactate was elevated 3.7 WBC count was 9 hemoglobin 15.9 creatinine 1.6 glucose 186 potassium 3.4 sodium 134 high-sensitivity troponin was 535 EKG showed normal sinus rhythm with anterolateral ST-T changes.  Given the low back pain and persistent fever MRI of the L-spine and T-spine has been ordered which is pending.  Patient is started on empiric antibiotics after blood cultures obtained and admitted for sepsis.  Patient was given sepsis protocol  IV fluid bolus.  Review of Systems: As per HPI, rest all negative.   Past Medical History:  Diagnosis Date  . Aortic stenosis    Aortic valve replacement 2006  . CAD (coronary artery disease)    a. s/p CABG 2006 at time of AVR.  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 Dyslipidemia   . Gallstones   . GERD (gastroesophageal reflux disease)   . Heart murmur   . Hx of CABG    2006,LIMA to LAD, SVG to diagonal  . Hx of transfusion of packed red blood cells   . Hypertension   . Hypothyroidism   . Iron deficiency anemia   . Lower GI bleed 06/2015  . Pericardial effusion    Insignificant small pericardial effusion seen on echo, November, 2013  . Peripheral vascular disease ([place]) 06   blood clot -stroke  . S/P aortic valve replacement    a. [place] procedure,#21 [employer] mechanical valve conduit with reimplantation of the coronaries 2006  . Status post colonoscopy with polypectomy    "bleeding; colonoscopy was ~ 06/18/2015"  . Stroke [facility]) 2006   no deficits   . Warfarin anticoagulation    coumadin therapy    Past Surgical History:  Procedure Laterality Date  . AORTIC VALVE REPLACEMENT  2006  . BACK SURGERY    . CARDIAC CATHETERIZATION  ~ 2006  . CARDIAC VALVE REPLACEMENT    . CESAREAN SECTION  1985  . COLONOSCOPY N/A 06/26/2015   Procedure: COLONOSCOPY;  Surgeon: Gatha Mayer, MD;  Location: [facility] ENDOSCOPY;  Service: Endoscopy;  Laterality: N/A;  . COLONOSCOPY W/ BIOPSIES AND POLYPECTOMY  06/18/2015  . CORONARY ARTERY BYPASS GRAFT  2006  . ESOPHAGOGASTRODUODENOSCOPY  06/18/2015  . INSERTION OF MESH  05/15/2016   Procedure: INSERTION OF MESH;  Surgeon: Johnathan Hausen, MD;  Location: [facility] ORS;  Service: General;;  . LAPAROSCOPIC ASSISTED VENTRAL HERNIA REPAIR N/A 05/15/2016   Procedure: LAPAROSCOPIC ASSISTED VENTRAL HERNIA REPAIR WITH MESH;  Surgeon: Johnathan Hausen, MD;  Location: [facility] ORS;  Service: General;  Laterality: N/A;  . LAPAROSCOPIC  CHOLECYSTECTOMY SINGLE PORT  07/21/2012   Procedure: LAPAROSCOPIC CHOLECYSTECTOMY SINGLE PORT;  Surgeon: Adin Hector, MD;  Location: [place];  Service: General;  Laterality: N/A;  . LUMBAR FUSION  ~ 1973  . TONSILLECTOMY       reports that she quit smoking about 14 years ago. Her smoking use included cigarettes. She has a 25.00 pack-year smoking history. She has never used smokeless tobacco. She reports that she does not drink alcohol or use drugs.  No Known Allergies  Family History  Problem Relation Age of Onset  . Stroke Mother   . Heart disease Mother   . Alcohol abuse Father   . Hypertension Father   . Stroke Father   . Hyperlipidemia Father   . Stroke Brother        she has three and at least one of them has had a stroke  . Colon cancer Neg Hx     Prior to Admission medications   Medication Sig Start Date End Date Taking? Authorizing Provider  acetaminophen (TYLENOL) 500 MG tablet Take 500-1,000 mg by mouth every 6 (six) hours as needed (for pain).    [provider]  Calcium Carbonate-Vitamin D (CALCIUM 600+D) 600-400 MG-UNIT per tablet Take 1 tablet by mouth 2 (two) times daily.    [provider]  furosemide (LASIX) 20 MG tablet Take 20 mg by mouth daily. 04/18/17   [provider]  levothyroxine (SYNTHROID, LEVOTHROID) 50 MCG tablet Take 50 mcg by mouth daily before breakfast.    [provider]  Multiple Vitamins-Minerals (PRESERVISION/LUTEIN PO) Take by mouth.    [provider]  omeprazole (PRILOSEC) 20 MG capsule Take 20 mg by mouth daily.      [provider]  ondansetron (ZOFRAN ODT) 4 MG disintegrating tablet Take 1 tablet (4 mg total) every 8 (eight) hours as needed by mouth for nausea or vomiting. 05/18/17   Kuppelweiser, Cassie L, RPH-CPP  potassium chloride (MICRO-K) 10 MEQ CR capsule TAKE 1 TABLET BY MOUTH EVERY DAY 05/02/14   Carlena Bjornstad, MD  simvastatin (ZOCOR) 40 MG tablet Take 1 tablet (40 mg total) by  mouth at bedtime. 07/14/13   Carlena Bjornstad, MD  triazolam (HALCION) 0.25 MG tablet Take 1 tablet by mouth at bedtime. 08/12/16   [provider]  warfarin (COUMADIN) 3 MG tablet TAKE 1 TABLET (3 MG TOTAL) BY MOUTH AS DIRECTED. 01/09/19   Dorothy Spark, MD  KLOR-CON M10 10 MEQ tablet TAKE 1 TABLET BY MOUTH EVERY DAY 03/02/14 04/01/14  Carlena Bjornstad, MD    Physical Exam: Constitutional: Moderately built and nourished. Vitals:   01/17/19 2316 01/17/19 2330 01/17/19 2345 01/18/19 0245  BP: 117/62 127/64 114/67 (!) 101/56  Pulse: (!) 128 (!) 120 (!) 126 100  Resp: (!) 30     Temp: (!) 103.7 F (39.8 C)     TempSrc: Rectal  SpO2:  95% 95% 94%  Weight:      Height:       Eyes: Anicteric no pallor. ENMT: No discharge from the ears eyes nose and mouth. Neck: No mass felt.  No neck rigidity. Respiratory: No rhonchi or crepitations. Cardiovascular: S1-S2 heard. Abdomen: Soft nontender bowel sounds present. Musculoskeletal: No edema.  No joint effusion. Skin: No rash. Neurologic: Alert awake oriented to name and place.  Moves all extremities 5 x 5. Psychiatric: Oriented to name and place.   Labs on Admission: I have personally reviewed following labs and imaging studies  CBC: Recent Labs  Lab 01/17/19 2107  WBC 9.0  NEUTROABS 8.2*  HGB 15.9*  HCT 46.3*  MCV 89.0  PLT 371*   Basic Metabolic Panel: Recent Labs  Lab 01/17/19 2107  NA 134*  K 3.4*  CL 103  CO2 17*  GLUCOSE 186*  BUN 17  CREATININE 1.67*  CALCIUM 9.3   GFR: Estimated Creatinine Clearance: 32.4 mL/min (A) (by C-G formula based on SCr of 1.67 mg/dL (H)). Liver Function Tests: Recent Labs  Lab 01/17/19 2107  AST 133*  ALT 96*  ALKPHOS 102  BILITOT 1.5*  PROT 7.6  ALBUMIN 3.9   No results for input(s): LIPASE, AMYLASE in the last 168 hours. No results for input(s): AMMONIA in the last 168 hours. Coagulation Profile: Recent Labs  Lab 01/17/19 2107  INR 2.0*   Cardiac Enzymes:  No results for input(s): CKTOTAL, CKMB, CKMBINDEX, TROPONINI in the last 168 hours. BNP (last 3 results) No results for input(s): PROBNP in the last 8760 hours. HbA1C: No results for input(s): HGBA1C in the last 72 hours. CBG: No results for input(s): GLUCAP in the last 168 hours. Lipid Profile: No results for input(s): CHOL, HDL, LDLCALC, TRIG, CHOLHDL, LDLDIRECT in the last 72 hours. Thyroid Function Tests: No results for input(s): TSH, T4TOTAL, FREET4, T3FREE, THYROIDAB in the last 72 hours. Anemia Panel: No results for input(s): VITAMINB12, FOLATE, FERRITIN, TIBC, IRON, RETICCTPCT in the last 72 hours. Urine analysis:    Component Value Date/Time   COLORURINE AMBER (A) 01/17/2019 2313   APPEARANCEUR HAZY (A) 01/17/2019 2313   LABSPEC 1.018 01/17/2019 2313   PHURINE 7.0 01/17/2019 2313   GLUCOSEU NEGATIVE 01/17/2019 2313   HGBUR LARGE (A) 01/17/2019 2313   BILIRUBINUR NEGATIVE 01/17/2019 2313   KETONESUR NEGATIVE 01/17/2019 2313   PROTEINUR >=300 (A) 01/17/2019 2313   NITRITE NEGATIVE 01/17/2019 2313   LEUKOCYTESUR NEGATIVE 01/17/2019 2313   Sepsis Labs: @LABRCNTIP (procalcitonin:4,lacticidven:4) ) Recent Results (from the past 240 hour(s))  SARS Coronavirus 2 (CEPHEID- Performed in [place] hospital lab), Hosp Order     Status: None   Collection Time: 01/18/19  2:02 AM   Specimen: Nasopharyngeal Swab  Result Value Ref Range Status   SARS Coronavirus 2 NEGATIVE NEGATIVE Final    Comment: (NOTE) If result is NEGATIVE SARS-CoV-2 target nucleic acids are NOT DETECTED. The SARS-CoV-2 RNA is generally detectable in upper and lower  respiratory specimens during the acute phase of infection. The lowest  concentration of SARS-CoV-2 viral copies this assay can detect is 250  copies / mL. A negative result does not preclude SARS-CoV-2 infection  and should not be used as the sole basis for treatment or other  patient management decisions.  A negative result may occur with   improper specimen collection / handling, submission of specimen other  than nasopharyngeal swab, presence of viral mutation(s) within the  areas targeted by this assay, and inadequate number of  viral copies  (<250 copies / mL). A negative result must be combined with clinical  observations, patient history, and epidemiological information. If result is POSITIVE SARS-CoV-2 target nucleic acids are DETECTED. The SARS-CoV-2 RNA is generally detectable in upper and lower  respiratory specimens dur ing the acute phase of infection.  Positive  results are indicative of active infection with SARS-CoV-2.  Clinical  correlation with patient history and other diagnostic information is  necessary to determine patient infection status.  Positive results do  not rule out bacterial infection or co-infection with other viruses. If result is PRESUMPTIVE POSTIVE SARS-CoV-2 nucleic acids MAY BE PRESENT.   A presumptive positive result was obtained on the submitted specimen  and confirmed on repeat testing.  While 2019 novel coronavirus  (SARS-CoV-2) nucleic acids may be present in the submitted sample  additional confirmatory testing may be necessary for epidemiological  and / or clinical management purposes  to differentiate between  SARS-CoV-2 and other Sarbecovirus currently known to infect humans.  If clinically indicated additional testing with an alternate test  methodology [id number]) is advised. The SARS-CoV-2 RNA is generally  detectable in upper and lower respiratory sp ecimens during the acute  phase of infection. The expected result is Negative. Fact Sheet for Patients:  StrictlyIdeas.no Fact Sheet for Healthcare Providers: BankingDealers.co.za This test is not yet approved or cleared by the [place] FDA and has been authorized for detection and/or diagnosis of SARS-CoV-2 by FDA under an Emergency Use Authorization (EUA).  This EUA will  remain in effect (meaning this test can be used) for the duration of the COVID-19 declaration under Section 564(b)(1) of the Act, 21 U.S.C. section 360bbb-3(b)(1), unless the authorization is terminated or revoked sooner. Performed at [facility], [place] [address], [place], [place] [address]      Radiological Exams on Admission: Ct Abdomen Pelvis Wo Contrast  Result Date: 01/18/2019 CLINICAL DATA:  Fever and back pain EXAM: CT ABDOMEN AND PELVIS WITHOUT CONTRAST TECHNIQUE: Multidetector CT imaging of the abdomen and pelvis was performed following the standard protocol without IV contrast. COMPARISON:  CT dated May 28, 2016. FINDINGS: Lower chest: The lung bases are clear. The heart size is normal. Hepatobiliary: There is likely borderline hepatic steatosis. Status post cholecystectomy.There is no biliary ductal dilation. Pancreas: Normal contours without ductal dilatation. No peripancreatic fluid collection. Spleen: No splenic laceration or hematoma. Adrenals/Urinary Tract: --Adrenal glands: No adrenal hemorrhage. --Right kidney/ureter: No hydronephrosis or perinephric hematoma. --Left kidney/ureter: There is some fat stranding about the left kidney which is slightly increased from prior study. There may be punctate nonobstructing stones in the interpolar region of the left kidney. There is no left-sided hydronephrosis. --Urinary bladder: Unremarkable. Stomach/Bowel: --Stomach/Duodenum: There is a moderate-sized hiatal hernia. --Small bowel: No dilatation or inflammation. --Colon: The colon is decompressed which limits evaluation. There is no definite CT evidence for colitis. --Appendix: Normal. Vascular/Lymphatic: Atherosclerotic calcification is present within the non-aneurysmal abdominal aorta, without hemodynamically significant stenosis. --No retroperitoneal lymphadenopathy. --No mesenteric lymphadenopathy. --No pelvic or inguinal lymphadenopathy. Reproductive: Unremarkable Other: No  ascites or free air. There is a small fat containing ventral wall hernia. Musculoskeletal. Multilevel degenerative disc disease and facet arthrosis. No bony spinal canal stenosis. IMPRESSION: 1. Slight interval increase in fat stranding about the left kidney without a clear identifiable cause. There is no left-sided hydronephrosis. There is a punctate nonobstructing stone in the interpolar region of the left kidney. Correlation with urinalysis is recommended to help exclude an ascending urinary tract infection.  2. Multiple chronic findings as above. Electronically Signed   By: Constance Holster M.D.   On: 01/18/2019 00:53   Dg Chest 2 View  Result Date: 01/17/2019 CLINICAL DATA:  Suspected sepsis. Fever, back pain. History of hypertension, CAD, CABG. EXAM: CHEST - 2 VIEW COMPARISON:  Chest radiograph 05/28/2016 FINDINGS: Postsurgical changes from prior CABG and aortic valve replacement. Sternotomy wires remain intact and aligned. Cardiomediastinal contours are otherwise unremarkable. Streaky areas of subsegmental atelectasis at the bases. Lungs are otherwise clear. Multilevel discogenic and changes in the spine. Cholecystectomy clips in the right upper quadrant. No acute osseous or soft tissue abnormality. IMPRESSION: Bibasilar atelectasis.  No other acute cardiopulmonary abnormality. Electronically Signed   By: MD Lovena Le   On: 01/17/2019 21:23   Ct Head Wo Contrast  Result Date: 01/18/2019 CLINICAL DATA:  Altered level of consciousness. EXAM: CT HEAD WITHOUT CONTRAST TECHNIQUE: Contiguous axial images were obtained from the base of the skull through the vertex without intravenous contrast. COMPARISON:  None. FINDINGS: Brain: No evidence of acute infarction, hemorrhage, hydrocephalus, extra-axial collection or mass lesion/mass effect. There is an old right MCA territory infarct. There is volume loss with chronic microvascular ischemic changes. Vascular: No hyperdense vessel or unexpected calcification.  Skull: Normal. Negative for fracture or focal lesion. Sinuses/Orbits: No acute finding. Other: None. IMPRESSION: 1. No acute intracranial abnormality. 2. Old right MCA territory infarct. Electronically Signed   By: Constance Holster M.D.   On: 01/18/2019 00:58    EKG: Independently reviewed.  Sinus tachycardia with ST-T changes in anterolateral leads.  Assessment/Plan Principal Problem:   Sepsis ([place]) Active Problems:   Hx of CABG   S/P aortic valve replacement   Hypothyroidism    1. Sepsis source not clear -presently MRI of the L-spine and T-spine are pending.  Patient will be continued on empiric antibiotics follow-up blood cultures lactate levels procalcitonin levels and IV fluids.  I also ordered 2D echo. 2. Elevated troponin with history of CABG-patient denies any chest pain.  Discussed with on-call cardiologist who at this time feel the patient's elevated troponins likely from sepsis.  Patient is on anticoagulation statins.  Will check 2D echo.  Cardiology consult. 3. History of mechanical aortic valve replacement on Coumadin.  Likely will place patient on heparin until we find a source of sepsis. 4. Hypothyroidism on Synthroid. 5. Acute renal failure likely from sepsis.  Repeat metabolic panel after hydration. 6. History of CVA on anticoagulation and statins. 7. Mild hypokalemia and hyponatremia likely from dehydration and sepsis.  Patient also takes Lasix.  Lasix on hold. 8. Hyperglycemia -we will check hemoglobin A1c.   Given the complexity of patient being febrile septic tachycardic confused with acute renal failure and source of sepsis unclear patient will need inpatient status.   DVT prophylaxis: Patient takes Coumadin INR is around 2.  We will start heparin if L-spine T-spine does not show any acute. Code Status: Full code. Family Communication: Patient husband. Disposition Plan: Home. Consults called: Consult cardiology. Admission status: Inpatient.   Rise Patience MD [facility] Pager [phone].  If 7PM-7AM, please contact night-coverage [email or web address] Password [facility]  01/18/2019, 4:24 AM

## 2019-01-18 NOTE — Consult Note (Addendum)
Cardiology Consultation:   Patient ID: Elizabeth Morton MRN: [id number]; DOB: 08/15/1955  Admit date: 01/17/2019 Date of Consult: 01/18/2019  Primary Care Provider: Levin Erp, MD Primary Cardiologist:Dr. Meda Coffee   Patient Profile:   Elizabeth Morton is a 63 y.o. 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 Hal Hope.   The patient was hospitalized in November 2017 for elective ventral hernia repair that was complicated by acute blood loss with significant rectal sheath hematoma. These happen when the patient was being bridged with Lovenox. This is now a second occasion when this happened and patient was advised not to use Lovenox injections in the future. Her bleeding required use of FFP's and vitamin K.  She was doing well on cardiac stand point when last evaluated by Dr. Meda Coffee 08/2016. No follow up since then for unknown reason.   Last echo 02/2017 showed LVEF of 60-65%, grade 1 DD, normal functioning aortic valve with mean gradient of 27mmg Hg from 41 mm Hg.   History of Present Illness:   Elizabeth Morton presented with 2 days hx of progressive worsening weakness, AMS and fever. No chest pain, shortness of breath, palpitations, orthopnea, PND, syncope or LE edema. In ER noted tachycardic, febrile with elevated lactic acid. CT of head and CT of abdomen unremarkable. Admitted for sepsis for unknown source. Started empirically on abx. Blood culture growing GPCs, BCID MSSA. No recent surgery or injury.   Cardiology is asked to evaluate the patient for elevated Hs-  troponin 535>>679 in setting of bacteremia. No chest pain prior to presentation. Compliant with medications. INR 2.0. Her mental status has been improved.   Heart Pathway Score:     Past Medical History:  Diagnosis Date   Aortic stenosis    Aortic valve replacement 2006   CAD (coronary artery disease)    a. s/p CABG 2006 at time of AVR.   CVA (cerebral infarction)    related to thrombosed aortic root aneurysm   Dyslipidemia    Gallstones    GERD (gastroesophageal reflux disease)    Heart murmur    Hx of CABG    2006,LIMA to LAD, SVG to diagonal   Hx of transfusion of packed red blood cells    Hypertension    Hypothyroidism    Iron deficiency anemia    Lower GI bleed 06/2015   Pericardial effusion    Insignificant small pericardial effusion seen on echo, November, 2013   Peripheral vascular disease ([place]) 06   blood clot -stroke   S/P aortic valve replacement    a. Bentall procedure,#21 [employer] mechanical valve conduit with reimplantation of the coronaries 2006   Status post colonoscopy with polypectomy    "bleeding; colonoscopy was ~ 06/18/2015"   Stroke [facility]) 2006   no deficits    Warfarin anticoagulation    coumadin therapy    Past Surgical History:  Procedure Laterality Date   AORTIC VALVE REPLACEMENT  2006   BACK SURGERY     CARDIAC CATHETERIZATION  ~ 2006   [place]   COLONOSCOPY N/A 06/26/2015  Procedure: COLONOSCOPY;  Surgeon: Gatha Mayer, MD;  Location: [place];  Service: Endoscopy;  Laterality: N/A;   COLONOSCOPY W/ BIOPSIES AND POLYPECTOMY  06/18/2015   CORONARY ARTERY BYPASS GRAFT  2006   ESOPHAGOGASTRODUODENOSCOPY  06/18/2015   INSERTION OF MESH  05/15/2016   Procedure: INSERTION OF MESH;  Surgeon: Johnathan Hausen, MD;  Location: [facility];  Service: General;;   LAPAROSCOPIC ASSISTED VENTRAL HERNIA REPAIR N/A 05/15/2016   Procedure: LAPAROSCOPIC ASSISTED VENTRAL HERNIA REPAIR WITH  MESH;  Surgeon: Johnathan Hausen, MD;  Location: [facility];  Service: General;  Laterality: N/A;   LAPAROSCOPIC CHOLECYSTECTOMY SINGLE PORT  07/21/2012   Procedure: LAPAROSCOPIC CHOLECYSTECTOMY SINGLE PORT;  Surgeon: Adin Hector, MD;  Location: [place];  Service: General;  Laterality: N/A;   LUMBAR FUSION  ~ 1973   TONSILLECTOMY       Inpatient Medications: Scheduled Meds:  levothyroxine  50 mcg Oral QAC breakfast   pantoprazole  40 mg Oral Daily   simvastatin  40 mg Oral QHS   Continuous Infusions:  sodium chloride 100 mL/hr at 01/18/19 1037    ceFAZolin (ANCEF) IV     PRN Meds: acetaminophen **OR** acetaminophen, ondansetron **OR** ondansetron (ZOFRAN) IV  Allergies:   No Known Allergies  Social History:   Social History   Socioeconomic History   Marital status: Married    Spouse name: Not on file   Number of children: 1   Years of education: Not on file   Highest education level: Not on file  Occupational History   Occupation: homemaker  Social Needs   Financial resource strain: Not on file   Food insecurity    Worry: Not on file    Inability: Not on file   Transportation needs    Medical: Not on file    Non-medical: Not on file  Tobacco Use   Smoking status: Former Smoker    Packs/day: 1.00    Years: 25.00    Pack years: 25.00    Types: Cigarettes    Quit date: 07/15/2004    Years since quitting: 14.5   Smokeless tobacco: Never Used  Substance and Sexual Activity   Alcohol use: No   Drug use: No   Sexual activity: Never  Lifestyle   Physical activity    Days per week: Not on file    Minutes per session: Not on file   Stress: Not on file  Relationships   Social connections    Talks on phone: Not on file    Gets together: Not on file    Attends religious service: Not on file    Active member of club or organization: Not on file    Attends meetings of clubs or organizations: Not on file    Relationship status: Not on file    Intimate partner violence    Fear of current or ex partner: Not on file    Emotionally abused: Not on file    Physically abused: Not on file    Forced sexual activity: Not on file  Other Topics Concern   Not on file  Social History Narrative   She lives in [place] with her husband and son. She previously smoked 37-pack-years,but quit following her stroke in April of 2006. She denies any alcohol or drugs. She has not been routinely exercising.    Family History:   Family History  Problem Relation Age of Onset   Stroke Mother    Heart disease Mother    Alcohol abuse Father  Hypertension Father    Stroke Father    Hyperlipidemia Father    Stroke Brother        she has three and at least one of them has had a stroke   Colon cancer Neg Hx      ROS:  Please see the history of present illness. All other ROS reviewed and negative.     Physical Exam/Data:   Vitals:   01/18/19 0738 01/18/19 0900 01/18/19 1000 01/18/19 1025  BP: (!) 110/51 (!) 128/59 100/62 (!) 104/56  Pulse: 100 (!) 104 (!) 105 (!) 103  Resp: (!) 26 (!) 25 19 (!) 23  Temp:    98.4 F (36.9 C)  TempSrc:    Oral  SpO2: 93% 94% 95% 95%  Weight:      Height:        Intake/Output Summary (Last 24 hours) at 01/18/2019 1038 Last data filed at 01/18/2019 0237 Gross per 24 hour  Intake 2637.47 ml  Output --  Net 2637.47 ml   Last 3 Weights 01/18/2019 01/17/2019 05/04/2017  Weight (lbs) 159 lb 9.8 oz 154 lb 1.6 oz 154 lb  Weight (kg) 72.4 kg 69.9 kg 69.854 kg     Body mass index is 28.27 kg/m.  General:  Well nourished, well developed, in no acute distress HEENT: normal Lymph: no adenopathy Neck: no JVD Endocrine:  No thryomegaly Vascular: No carotid bruits; FA pulses 2+ bilaterally without bruits  Cardiac:  normal S1, S2; regular tachycardic, crisp mechanical valve sound Lungs:  clear to auscultation bilaterally, no wheezing, rhonchi or rales  Abd: soft, nontender, no hepatomegaly  Ext: no  edema Musculoskeletal:  No deformities, BUE and BLE strength normal and equal Skin: warm and dry  Neuro:  CNs 2-12 intact, no focal abnormalities noted Psych:  Normal affect   EKG:  The EKG was personally reviewed and demonstrates:  Sinus tachycardia at rate of 119 bpm, anterolateral ST/T wave abnormality - similar to prior EKG in 2017 Telemetry:  Telemetry was personally reviewed and demonstrates:  Sinus rhythm with HR 90-120s  Relevant CV Studies:  Echo 02/10/2017 Study Conclusions  - Left ventricle: The cavity size was normal. There was moderate   concentric hypertrophy. Systolic function was normal. The   estimated ejection fraction was in the range of 60% to 65%. Wall   motion was normal; there were no regional wall motion   abnormalities. Doppler parameters are consistent with abnormal   left ventricular relaxation (grade 1 diastolic dysfunction). - Aortic valve: Poorly visualized. A prosthesis was present and   demonstrates elevated transvalvular gradient. The prosthesis had   a normal range of motion. The sewing ring appeared normal, had no   rocking motion, and showed no evidence of dehiscence. Peak   velocity (S): 347 cm/s. Mean gradient (S): 26 mm Hg. - Mitral valve: Calcified annulus. Mildly thickened leaflets .   There was trivial regurgitation.  Impressions:  - Prior echo: Peak velocity aortic valve was 4.86m/s, mean of   71mmHg. Current echo improved.  Laboratory Data:  High Sensitivity Troponin:   Recent Labs  Lab 01/18/19 0004 01/18/19 0312  TROPONINIHS 535* 679*      Chemistry Recent Labs  Lab 01/17/19 2107 01/18/19 0543  NA 134* 136  K 3.4* 3.5  CL 103 105  CO2 17* 19*  GLUCOSE 186* 128*  BUN 17 21  CREATININE 1.67* 1.75*  CALCIUM 9.3 8.5*  GFRNONAA 33* 31*  GFRAA 38* 36*  ANIONGAP 14 12  Recent Labs  Lab 01/17/19 2107 01/18/19 0543  PROT 7.6 6.3*  ALBUMIN 3.9 3.1*  AST 133* 88*  ALT 96* 68*  ALKPHOS 102 80  BILITOT 1.5* 0.9    Hematology Recent Labs  Lab 01/17/19 2107 01/18/19 0543  WBC 9.0 7.0  RBC 5.20* 4.78  HGB 15.9* 14.6  HCT 46.3* 42.7  MCV 89.0 89.3  MCH 30.6 30.5  MCHC 34.3 34.2  RDW 13.0 13.1  PLT 148* 111*    Radiology/Studies:  Ct Abdomen Pelvis Wo Contrast  Result Date: 01/18/2019 CLINICAL DATA:  Fever and back pain EXAM: CT ABDOMEN AND PELVIS WITHOUT CONTRAST TECHNIQUE: Multidetector CT imaging of the abdomen and pelvis was performed following the standard protocol without IV contrast. COMPARISON:  CT dated May 28, 2016. FINDINGS: Lower chest: The lung bases are clear. The heart size is normal. Hepatobiliary: There is likely borderline hepatic steatosis. Status post cholecystectomy.There is no biliary ductal dilation. Pancreas: Normal contours without ductal dilatation. No peripancreatic fluid collection. Spleen: No splenic laceration or hematoma. Adrenals/Urinary Tract: --Adrenal glands: No adrenal hemorrhage. --Right kidney/ureter: No hydronephrosis or perinephric hematoma. --Left kidney/ureter: There is some fat stranding about the left kidney which is slightly increased from prior study. There may be punctate nonobstructing stones in the interpolar region of the left kidney. There is no left-sided hydronephrosis. --Urinary bladder: Unremarkable. Stomach/Bowel: --Stomach/Duodenum: There is a moderate-sized hiatal hernia. --Small bowel: No dilatation or inflammation. --Colon: The colon is decompressed which limits evaluation. There is no definite CT evidence for colitis. --Appendix: Normal. Vascular/Lymphatic: Atherosclerotic calcification is present within the non-aneurysmal abdominal aorta, without hemodynamically significant stenosis. --No retroperitoneal lymphadenopathy. --No mesenteric lymphadenopathy. --No pelvic or inguinal lymphadenopathy. Reproductive: Unremarkable Other: No ascites or free air. There is a small fat containing ventral wall hernia. Musculoskeletal. Multilevel degenerative  disc disease and facet arthrosis. No bony spinal canal stenosis. IMPRESSION: 1. Slight interval increase in fat stranding about the left kidney without a clear identifiable cause. There is no left-sided hydronephrosis. There is a punctate nonobstructing stone in the interpolar region of the left kidney. Correlation with urinalysis is recommended to help exclude an ascending urinary tract infection. 2. Multiple chronic findings as above. Electronically Signed   By: Constance Holster M.D.   On: 01/18/2019 00:53   Dg Chest 2 View  Result Date: 01/17/2019 CLINICAL DATA:  Suspected sepsis. Fever, back pain. History of hypertension, CAD, CABG. EXAM: CHEST - 2 VIEW COMPARISON:  Chest radiograph 05/28/2016 FINDINGS: Postsurgical changes from prior CABG and aortic valve replacement. Sternotomy wires remain intact and aligned. Cardiomediastinal contours are otherwise unremarkable. Streaky areas of subsegmental atelectasis at the bases. Lungs are otherwise clear. Multilevel discogenic and changes in the spine. Cholecystectomy clips in the right upper quadrant. No acute osseous or soft tissue abnormality. IMPRESSION: Bibasilar atelectasis.  No other acute cardiopulmonary abnormality. Electronically Signed   By: MD Lovena Le   On: 01/17/2019 21:23   Ct Head Wo Contrast  Result Date: 01/18/2019 CLINICAL DATA:  Altered level of consciousness. EXAM: CT HEAD WITHOUT CONTRAST TECHNIQUE: Contiguous axial images were obtained from the base of the skull through the vertex without intravenous contrast. COMPARISON:  None. FINDINGS: Brain: No evidence of acute infarction, hemorrhage, hydrocephalus, extra-axial collection or mass lesion/mass effect. There is an old right MCA territory infarct. There is volume loss with chronic microvascular ischemic changes. Vascular: No hyperdense vessel or unexpected calcification. Skull: Normal. Negative for fracture or focal lesion. Sinuses/Orbits: No acute finding. Other: None. IMPRESSION: 1.  No acute intracranial  abnormality. 2. Old right MCA territory infarct. Electronically Signed   By: Constance Holster M.D.   On: 01/18/2019 00:58   Mr Thoracic Spine W Wo Contrast  Result Date: 01/18/2019 CLINICAL DATA:  Initial evaluation for acute fever, back pain, altered mental status. Concern for infection or malignancy. EXAM: MRI THORACIC AND LUMBAR SPINE WITHOUT AND WITH CONTRAST TECHNIQUE: Multiplanar and multiecho pulse sequences of the thoracic and lumbar spine, were obtained without and with intravenous contrast. CONTRAST:  7 cc of Gadavist. COMPARISON:  None available. FINDINGS: MRI THORACIC SPINE FINDINGS Alignment: Mild dextroscoliosis. Alignment otherwise normal with preservation of the normal thoracic kyphosis. No listhesis or subluxation. Vertebrae: Vertebral body height maintained without evidence for acute or chronic fracture. Bone marrow signal intensity within normal limits. Few scattered benign hemangiomas noted, most prominent of which position within the T7 vertebral body and measures 1 cm. No other discrete or worrisome osseous lesions. No evidence for malignancy. Reactive marrow edema and enhancement about the anterior aspect of the T8-9 interspace felt to be degenerative in nature. No evidence for osteomyelitis discitis or septic arthritis. No other acute infection within the thoracic spine. Cord: Signal intensity within the thoracic spinal cord is normal. No epidural abscess or other collection. Prominent dorsal epidural fat noted within the midthoracic spine. Paraspinal tissues: Paraspinous soft tissues demonstrate no acute finding. Trace layering right pleural effusion with associated right basilar atelectatic changes. Visualized lungs otherwise grossly clear. Visualized visceral structures within normal limits. Disc levels: T12-L1: Small right paracentral disc protrusion mildly indents the right ventral thecal sac (series 23, image 38). No significant stenosis or impingement. No  other significant disc pathology seen within the thoracic spine. No significant facet degeneration. No canal or neural foraminal stenosis. No impingement. MRI LUMBAR SPINE FINDINGS Segmentation: Standard. Lowest well-formed disc labeled the L5-S1 level. Alignment: 4 mm anterolisthesis of L5 on S1. Trace retrolisthesis of L2 on L3 and L3 on L4. Exaggeration of the normal lumbar lordosis. Vertebrae: Vertebral body height maintained without evidence for acute or chronic fracture. Bone marrow signal intensity within normal limits. No discrete or worrisome osseous lesions. No evidence for malignancy. No abnormal marrow edema or enhancement to suggest osteomyelitis discitis or septic arthritis. Conus medullaris and cauda equina: Conus extends to the L1 level. Conus and cauda equina appear normal. No epidural abscess or other collections. Paraspinal and other soft tissues: Paraspinous soft tissues demonstrate no acute finding. Moderate distension of the partially visualized urinary bladder. Mild stranding about the left greater than right kidneys, better seen on prior CT. Visualized visceral structures otherwise unremarkable. Disc levels: L1-2:  Mild annular disc bulge.  No stenosis or impingement. L2-3: Mild annular disc bulge. Small posterior annular fissure. Mild facet hypertrophy. No significant canal or foraminal stenosis. L3-4: Mild annular disc bulge with disc desiccation. Moderate to advanced facet and ligament flavum hypertrophy. Resultant moderate spinal stenosis. Mild left greater than right L3 foraminal narrowing. L4-5: Negative interspace. Prior posterior fusion. No canal or foraminal stenosis. L5-S1: Anterolisthesis. Associated broad posterior pseudo disc bulge/uncovering. Prior posterior fusion. No canal or foraminal stenosis. No impingement. IMPRESSION: 1. No MRI evidence for acute abnormality within the thoracic or lumbar spine. No evidence for infection or malignancy. 2. Small right paracentral disc  protrusion at T12-L1 without stenosis or impingement. No other significant disc pathology or stenosis within the thoracic spine. 3. Mild disc bulging with advanced facet hypertrophy at L3-4 with resultant moderate spinal stenosis, with mild bilateral L3 foraminal narrowing. 4. Prior posterior fusion at L4-5 and  L5-S1 without residual or recurrent stenosis. Electronically Signed   By: Jeannine Boga M.D.   On: 01/18/2019 05:11   Mr Lumbar Spine W Wo Contrast  Result Date: 01/18/2019 CLINICAL DATA:  Initial evaluation for acute fever, back pain, altered mental status. Concern for infection or malignancy. EXAM: MRI THORACIC AND LUMBAR SPINE WITHOUT AND WITH CONTRAST TECHNIQUE: Multiplanar and multiecho pulse sequences of the thoracic and lumbar spine, were obtained without and with intravenous contrast. CONTRAST:  7 cc of Gadavist. COMPARISON:  None available. FINDINGS: MRI THORACIC SPINE FINDINGS Alignment: Mild dextroscoliosis. Alignment otherwise normal with preservation of the normal thoracic kyphosis. No listhesis or subluxation. Vertebrae: Vertebral body height maintained without evidence for acute or chronic fracture. Bone marrow signal intensity within normal limits. Few scattered benign hemangiomas noted, most prominent of which position within the T7 vertebral body and measures 1 cm. No other discrete or worrisome osseous lesions. No evidence for malignancy. Reactive marrow edema and enhancement about the anterior aspect of the T8-9 interspace felt to be degenerative in nature. No evidence for osteomyelitis discitis or septic arthritis. No other acute infection within the thoracic spine. Cord: Signal intensity within the thoracic spinal cord is normal. No epidural abscess or other collection. Prominent dorsal epidural fat noted within the midthoracic spine. Paraspinal tissues: Paraspinous soft tissues demonstrate no acute finding. Trace layering right pleural effusion with associated right basilar  atelectatic changes. Visualized lungs otherwise grossly clear. Visualized visceral structures within normal limits. Disc levels: T12-L1: Small right paracentral disc protrusion mildly indents the right ventral thecal sac (series 23, image 38). No significant stenosis or impingement. No other significant disc pathology seen within the thoracic spine. No significant facet degeneration. No canal or neural foraminal stenosis. No impingement. MRI LUMBAR SPINE FINDINGS Segmentation: Standard. Lowest well-formed disc labeled the L5-S1 level. Alignment: 4 mm anterolisthesis of L5 on S1. Trace retrolisthesis of L2 on L3 and L3 on L4. Exaggeration of the normal lumbar lordosis. Vertebrae: Vertebral body height maintained without evidence for acute or chronic fracture. Bone marrow signal intensity within normal limits. No discrete or worrisome osseous lesions. No evidence for malignancy. No abnormal marrow edema or enhancement to suggest osteomyelitis discitis or septic arthritis. Conus medullaris and cauda equina: Conus extends to the L1 level. Conus and cauda equina appear normal. No epidural abscess or other collections. Paraspinal and other soft tissues: Paraspinous soft tissues demonstrate no acute finding. Moderate distension of the partially visualized urinary bladder. Mild stranding about the left greater than right kidneys, better seen on prior CT. Visualized visceral structures otherwise unremarkable. Disc levels: L1-2:  Mild annular disc bulge.  No stenosis or impingement. L2-3: Mild annular disc bulge. Small posterior annular fissure. Mild facet hypertrophy. No significant canal or foraminal stenosis. L3-4: Mild annular disc bulge with disc desiccation. Moderate to advanced facet and ligament flavum hypertrophy. Resultant moderate spinal stenosis. Mild left greater than right L3 foraminal narrowing. L4-5: Negative interspace. Prior posterior fusion. No canal or foraminal stenosis. L5-S1: Anterolisthesis. Associated  broad posterior pseudo disc bulge/uncovering. Prior posterior fusion. No canal or foraminal stenosis. No impingement. IMPRESSION: 1. No MRI evidence for acute abnormality within the thoracic or lumbar spine. No evidence for infection or malignancy. 2. Small right paracentral disc protrusion at T12-L1 without stenosis or impingement. No other significant disc pathology or stenosis within the thoracic spine. 3. Mild disc bulging with advanced facet hypertrophy at L3-4 with resultant moderate spinal stenosis, with mild bilateral L3 foraminal narrowing. 4. Prior posterior fusion at L4-5 and L5-S1  without residual or recurrent stenosis. Electronically Signed   By: Jeannine Boga M.D.   On: 01/18/2019 05:11    Assessment and Plan:   1. Elevated troponin without chest pain  - Hs- troponin 535>>679. Likely demand in setting of acute illness. No further work up  2. CAD s/p CABG - No angina. Not on ASA due to need of anticoagulation. Continue statin.   3. Aortic stenosis s/p mechanical AVR  - Last echo in 2018 showed improved. mean gradient of 19mmg Hg.  - On coumadin for anticoagulation. INR 2.0 today. Prior hx of GIB while on Lovenox bridge. Advised not to use Lovenox injections in the future. Would avoid bridging with heparin if possible/no invasive procedure planned. - Given bacteremia and fever, need to r/o endocarditis. Echo done, pending result. +/- TEE based on further work up and echo result.   4. Sepsis 2nd to bacteremia - Evaluation per primary team. Consider ID consult.  - On broad spectrum abx  For questions or updates, please contact [place] Please consult [email or web address] for contact info under   Jarrett Soho, PA  01/18/2019 10:38 AM   Agree with note by Robbie Lis PA-C  We are asked to see Elizabeth Morton by the primary care service for mildly elevated troponins.  She has a history of CABG x2, AVR with a mechanical valve and Bentall procedure back in 2006.  She is  a patient of Dr. Meda Coffee but has not seen her in several years.  She has been feeling poorly for the last several days with nonspecific complaints.  She does deny chest pain or shortness of breath.  Blood cultures have been positive and she is on antibiotics.  Transthoracic echo shows normal LV function and what appears to be normal valve function of his has not been officially read.  Her troponins are in the 500 range and are flat.  Her exam is benign with crisp valve sounds and is soft outflow tract murmur with no stigmata of SBE.  Her INR is 2, subtherapeutic, on Coumadin oral anticoagulation.  She will most likely need a transesophageal echo to rule out SBE and ID consult for guidance on duration of antibiotic therapy.  Lorretta Harp, M.D., [place], [facility], [place] [address] [place], [place]  [address]  [phone] 01/18/2019 11:13 AM

## 2019-01-18 NOTE — H&P (View-Only) (Signed)
Cardiology Consultation:   Patient ID: EVELLA KASAL MRN: [id number]; DOB: 1956/03/11  Admit date: 01/17/2019 Date of Consult: 01/18/2019  Primary Care Provider: Levin Erp, MD Primary Cardiologist:Dr. Meda Coffee   Patient Profile:   Elizabeth Morton is a 63 y.o. 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 Hal Hope.   The patient was hospitalized in November 2017 for elective ventral hernia repair that was complicated by acute blood loss with significant rectal sheath hematoma. These happen when the patient was being bridged with Lovenox. This is now a second occasion when this happened and patient was advised not to use Lovenox injections in the future. Her bleeding required use of FFP's and vitamin K.  She was doing well on cardiac stand point when last evaluated by Dr. Meda Coffee 08/2016. No follow up since then for unknown reason.   Last echo 02/2017 showed LVEF of 60-65%, grade 1 DD, normal functioning aortic valve with mean gradient of 14mmg Hg from 41 mm Hg.   History of Present Illness:   Elizabeth Morton presented with 2 days hx of progressive worsening weakness, AMS and fever. No chest pain, shortness of breath, palpitations, orthopnea, PND, syncope or LE edema. In ER noted tachycardic, febrile with elevated lactic acid. CT of head and CT of abdomen unremarkable. Admitted for sepsis for unknown source. Started empirically on abx. Blood culture growing GPCs, BCID MSSA. No recent surgery or injury.   Cardiology is asked to evaluate the patient for elevated Hs-  troponin 535>>679 in setting of bacteremia. No chest pain prior to presentation. Compliant with medications. INR 2.0. Her mental status has been improved.   Heart Pathway Score:     Past Medical History:  Diagnosis Date   Aortic stenosis    Aortic valve replacement 2006   CAD (coronary artery disease)    a. s/p CABG 2006 at time of AVR.   CVA (cerebral infarction)    related to thrombosed aortic root aneurysm   Dyslipidemia    Gallstones    GERD (gastroesophageal reflux disease)    Heart murmur    Hx of CABG    2006,LIMA to LAD, SVG to diagonal   Hx of transfusion of packed red blood cells    Hypertension    Hypothyroidism    Iron deficiency anemia    Lower GI bleed 06/2015   Pericardial effusion    Insignificant small pericardial effusion seen on echo, November, 2013   Peripheral vascular disease ([place]) 06   blood clot -stroke   S/P aortic valve replacement    a. Bentall procedure,#21 [employer] mechanical valve conduit with reimplantation of the coronaries 2006   Status post colonoscopy with polypectomy    "bleeding; colonoscopy was ~ 06/18/2015"   Stroke [facility]) 2006   no deficits    Warfarin anticoagulation    coumadin therapy    Past Surgical History:  Procedure Laterality Date   AORTIC VALVE REPLACEMENT  2006   BACK SURGERY     CARDIAC CATHETERIZATION  ~ 2006   [place]   COLONOSCOPY N/A 06/26/2015  Procedure: COLONOSCOPY;  Surgeon: Gatha Mayer, MD;  Location: [facility];  Service: Endoscopy;  Laterality: N/A;   COLONOSCOPY W/ BIOPSIES AND POLYPECTOMY  06/18/2015   CORONARY ARTERY BYPASS GRAFT  2006   ESOPHAGOGASTRODUODENOSCOPY  06/18/2015   INSERTION OF MESH  05/15/2016   Procedure: INSERTION OF MESH;  Surgeon: Johnathan Hausen, MD;  Location: [facility];  Service: General;;   LAPAROSCOPIC ASSISTED VENTRAL HERNIA REPAIR N/A 05/15/2016   Procedure: LAPAROSCOPIC ASSISTED VENTRAL HERNIA REPAIR WITH  MESH;  Surgeon: Johnathan Hausen, MD;  Location: [facility];  Service: General;  Laterality: N/A;   LAPAROSCOPIC CHOLECYSTECTOMY SINGLE PORT  07/21/2012   Procedure: LAPAROSCOPIC CHOLECYSTECTOMY SINGLE PORT;  Surgeon: Adin Hector, MD;  Location: [facility];  Service: General;  Laterality: N/A;   LUMBAR FUSION  ~ 1973   TONSILLECTOMY       Inpatient Medications: Scheduled Meds:  levothyroxine  50 mcg Oral QAC breakfast   pantoprazole  40 mg Oral Daily   simvastatin  40 mg Oral QHS   Continuous Infusions:  sodium chloride 100 mL/hr at 01/18/19 1037    ceFAZolin (ANCEF) IV     PRN Meds: acetaminophen **OR** acetaminophen, ondansetron **OR** ondansetron (ZOFRAN) IV  Allergies:   No Known Allergies  Social History:   Social History   Socioeconomic History   Marital status: Married    Spouse name: Not on file   Number of children: 1   Years of education: Not on file   Highest education level: Not on file  Occupational History   Occupation: homemaker  Social Needs   Financial resource strain: Not on file   Food insecurity    Worry: Not on file    Inability: Not on file   Transportation needs    Medical: Not on file    Non-medical: Not on file  Tobacco Use   Smoking status: Former Smoker    Packs/day: 1.00    Years: 25.00    Pack years: 25.00    Types: Cigarettes    Quit date: 07/15/2004    Years since quitting: 14.5   Smokeless tobacco: Never Used  Substance and Sexual Activity   Alcohol use: No   Drug use: No   Sexual activity: Never  Lifestyle   Physical activity    Days per week: Not on file    Minutes per session: Not on file   Stress: Not on file  Relationships   Social connections    Talks on phone: Not on file    Gets together: Not on file    Attends religious service: Not on file    Active member of club or organization: Not on file    Attends meetings of clubs or organizations: Not on file    Relationship status: Not on file    Intimate partner violence    Fear of current or ex partner: Not on file    Emotionally abused: Not on file    Physically abused: Not on file    Forced sexual activity: Not on file  Other Topics Concern   Not on file  Social History Narrative   She lives in [place] with her husband and son. She previously smoked 37-pack-years,but quit following her stroke in April of 2006. She denies any alcohol or drugs. She has not been routinely exercising.    Family History:   Family History  Problem Relation Age of Onset   Stroke Mother    Heart disease Mother    Alcohol abuse Father  Hypertension Father    Stroke Father    Hyperlipidemia Father    Stroke Brother        she has three and at least one of them has had a stroke   Colon cancer Neg Hx      ROS:  Please see the history of present illness. All other ROS reviewed and negative.     Physical Exam/Data:   Vitals:   01/18/19 0738 01/18/19 0900 01/18/19 1000 01/18/19 1025  BP: (!) 110/51 (!) 128/59 100/62 (!) 104/56  Pulse: 100 (!) 104 (!) 105 (!) 103  Resp: (!) 26 (!) 25 19 (!) 23  Temp:    98.4 F (36.9 C)  TempSrc:    Oral  SpO2: 93% 94% 95% 95%  Weight:      Height:        Intake/Output Summary (Last 24 hours) at 01/18/2019 1038 Last data filed at 01/18/2019 0237 Gross per 24 hour  Intake 2637.47 ml  Output --  Net 2637.47 ml   Last 3 Weights 01/18/2019 01/17/2019 05/04/2017  Weight (lbs) 159 lb 9.8 oz 154 lb 1.6 oz 154 lb  Weight (kg) 72.4 kg 69.9 kg 69.854 kg     Body mass index is 28.27 kg/m.  General:  Well nourished, well developed, in no acute distress HEENT: normal Lymph: no adenopathy Neck: no JVD Endocrine:  No thryomegaly Vascular: No carotid bruits; FA pulses 2+ bilaterally without bruits  Cardiac:  normal S1, S2; regular tachycardic, crisp mechanical valve sound Lungs:  clear to auscultation bilaterally, no wheezing, rhonchi or rales  Abd: soft, nontender, no hepatomegaly  Ext: no  edema Musculoskeletal:  No deformities, BUE and BLE strength normal and equal Skin: warm and dry  Neuro:  CNs 2-12 intact, no focal abnormalities noted Psych:  Normal affect   EKG:  The EKG was personally reviewed and demonstrates:  Sinus tachycardia at rate of 119 bpm, anterolateral ST/T wave abnormality - similar to prior EKG in 2017 Telemetry:  Telemetry was personally reviewed and demonstrates:  Sinus rhythm with HR 90-120s  Relevant CV Studies:  Echo 02/10/2017 Study Conclusions  - Left ventricle: The cavity size was normal. There was moderate   concentric hypertrophy. Systolic function was normal. The   estimated ejection fraction was in the range of 60% to 65%. Wall   motion was normal; there were no regional wall motion   abnormalities. Doppler parameters are consistent with abnormal   left ventricular relaxation (grade 1 diastolic dysfunction). - Aortic valve: Poorly visualized. A prosthesis was present and   demonstrates elevated transvalvular gradient. The prosthesis had   a normal range of motion. The sewing ring appeared normal, had no   rocking motion, and showed no evidence of dehiscence. Peak   velocity (S): 347 cm/s. Mean gradient (S): 26 mm Hg. - Mitral valve: Calcified annulus. Mildly thickened leaflets .   There was trivial regurgitation.  Impressions:  - Prior echo: Peak velocity aortic valve was 4.57m/s, mean of   51mmHg. Current echo improved.  Laboratory Data:  High Sensitivity Troponin:   Recent Labs  Lab 01/18/19 0004 01/18/19 0312  TROPONINIHS 535* 679*      Chemistry Recent Labs  Lab 01/17/19 2107 01/18/19 0543  NA 134* 136  K 3.4* 3.5  CL 103 105  CO2 17* 19*  GLUCOSE 186* 128*  BUN 17 21  CREATININE 1.67* 1.75*  CALCIUM 9.3 8.5*  GFRNONAA 33* 31*  GFRAA 38* 36*  ANIONGAP 14 12  Recent Labs  Lab 01/17/19 2107 01/18/19 0543  PROT 7.6 6.3*  ALBUMIN 3.9 3.1*  AST 133* 88*  ALT 96* 68*  ALKPHOS 102 80  BILITOT 1.5* 0.9    Hematology Recent Labs  Lab 01/17/19 2107 01/18/19 0543  WBC 9.0 7.0  RBC 5.20* 4.78  HGB 15.9* 14.6  HCT 46.3* 42.7  MCV 89.0 89.3  MCH 30.6 30.5  MCHC 34.3 34.2  RDW 13.0 13.1  PLT 148* 111*    Radiology/Studies:  Ct Abdomen Pelvis Wo Contrast  Result Date: 01/18/2019 CLINICAL DATA:  Fever and back pain EXAM: CT ABDOMEN AND PELVIS WITHOUT CONTRAST TECHNIQUE: Multidetector CT imaging of the abdomen and pelvis was performed following the standard protocol without IV contrast. COMPARISON:  CT dated May 28, 2016. FINDINGS: Lower chest: The lung bases are clear. The heart size is normal. Hepatobiliary: There is likely borderline hepatic steatosis. Status post cholecystectomy.There is no biliary ductal dilation. Pancreas: Normal contours without ductal dilatation. No peripancreatic fluid collection. Spleen: No splenic laceration or hematoma. Adrenals/Urinary Tract: --Adrenal glands: No adrenal hemorrhage. --Right kidney/ureter: No hydronephrosis or perinephric hematoma. --Left kidney/ureter: There is some fat stranding about the left kidney which is slightly increased from prior study. There may be punctate nonobstructing stones in the interpolar region of the left kidney. There is no left-sided hydronephrosis. --Urinary bladder: Unremarkable. Stomach/Bowel: --Stomach/Duodenum: There is a moderate-sized hiatal hernia. --Small bowel: No dilatation or inflammation. --Colon: The colon is decompressed which limits evaluation. There is no definite CT evidence for colitis. --Appendix: Normal. Vascular/Lymphatic: Atherosclerotic calcification is present within the non-aneurysmal abdominal aorta, without hemodynamically significant stenosis. --No retroperitoneal lymphadenopathy. --No mesenteric lymphadenopathy. --No pelvic or inguinal lymphadenopathy. Reproductive: Unremarkable Other: No ascites or free air. There is a small fat containing ventral wall hernia. Musculoskeletal. Multilevel degenerative  disc disease and facet arthrosis. No bony spinal canal stenosis. IMPRESSION: 1. Slight interval increase in fat stranding about the left kidney without a clear identifiable cause. There is no left-sided hydronephrosis. There is a punctate nonobstructing stone in the interpolar region of the left kidney. Correlation with urinalysis is recommended to help exclude an ascending urinary tract infection. 2. Multiple chronic findings as above. Electronically Signed   By: Constance Holster M.D.   On: 01/18/2019 00:53   Dg Chest 2 View  Result Date: 01/17/2019 CLINICAL DATA:  Suspected sepsis. Fever, back pain. History of hypertension, CAD, CABG. EXAM: CHEST - 2 VIEW COMPARISON:  Chest radiograph 05/28/2016 FINDINGS: Postsurgical changes from prior CABG and aortic valve replacement. Sternotomy wires remain intact and aligned. Cardiomediastinal contours are otherwise unremarkable. Streaky areas of subsegmental atelectasis at the bases. Lungs are otherwise clear. Multilevel discogenic and changes in the spine. Cholecystectomy clips in the right upper quadrant. No acute osseous or soft tissue abnormality. IMPRESSION: Bibasilar atelectasis.  No other acute cardiopulmonary abnormality. Electronically Signed   By: MD Lovena Le   On: 01/17/2019 21:23   Ct Head Wo Contrast  Result Date: 01/18/2019 CLINICAL DATA:  Altered level of consciousness. EXAM: CT HEAD WITHOUT CONTRAST TECHNIQUE: Contiguous axial images were obtained from the base of the skull through the vertex without intravenous contrast. COMPARISON:  None. FINDINGS: Brain: No evidence of acute infarction, hemorrhage, hydrocephalus, extra-axial collection or mass lesion/mass effect. There is an old right MCA territory infarct. There is volume loss with chronic microvascular ischemic changes. Vascular: No hyperdense vessel or unexpected calcification. Skull: Normal. Negative for fracture or focal lesion. Sinuses/Orbits: No acute finding. Other: None. IMPRESSION: 1.  No acute intracranial  abnormality. 2. Old right MCA territory infarct. Electronically Signed   By: Constance Holster M.D.   On: 01/18/2019 00:58   Mr Thoracic Spine W Wo Contrast  Result Date: 01/18/2019 CLINICAL DATA:  Initial evaluation for acute fever, back pain, altered mental status. Concern for infection or malignancy. EXAM: MRI THORACIC AND LUMBAR SPINE WITHOUT AND WITH CONTRAST TECHNIQUE: Multiplanar and multiecho pulse sequences of the thoracic and lumbar spine, were obtained without and with intravenous contrast. CONTRAST:  7 cc of Gadavist. COMPARISON:  None available. FINDINGS: MRI THORACIC SPINE FINDINGS Alignment: Mild dextroscoliosis. Alignment otherwise normal with preservation of the normal thoracic kyphosis. No listhesis or subluxation. Vertebrae: Vertebral body height maintained without evidence for acute or chronic fracture. Bone marrow signal intensity within normal limits. Few scattered benign hemangiomas noted, most prominent of which position within the T7 vertebral body and measures 1 cm. No other discrete or worrisome osseous lesions. No evidence for malignancy. Reactive marrow edema and enhancement about the anterior aspect of the T8-9 interspace felt to be degenerative in nature. No evidence for osteomyelitis discitis or septic arthritis. No other acute infection within the thoracic spine. Cord: Signal intensity within the thoracic spinal cord is normal. No epidural abscess or other collection. Prominent dorsal epidural fat noted within the midthoracic spine. Paraspinal tissues: Paraspinous soft tissues demonstrate no acute finding. Trace layering right pleural effusion with associated right basilar atelectatic changes. Visualized lungs otherwise grossly clear. Visualized visceral structures within normal limits. Disc levels: T12-L1: Small right paracentral disc protrusion mildly indents the right ventral thecal sac (series 23, image 38). No significant stenosis or impingement. No  other significant disc pathology seen within the thoracic spine. No significant facet degeneration. No canal or neural foraminal stenosis. No impingement. MRI LUMBAR SPINE FINDINGS Segmentation: Standard. Lowest well-formed disc labeled the L5-S1 level. Alignment: 4 mm anterolisthesis of L5 on S1. Trace retrolisthesis of L2 on L3 and L3 on L4. Exaggeration of the normal lumbar lordosis. Vertebrae: Vertebral body height maintained without evidence for acute or chronic fracture. Bone marrow signal intensity within normal limits. No discrete or worrisome osseous lesions. No evidence for malignancy. No abnormal marrow edema or enhancement to suggest osteomyelitis discitis or septic arthritis. Conus medullaris and cauda equina: Conus extends to the L1 level. Conus and cauda equina appear normal. No epidural abscess or other collections. Paraspinal and other soft tissues: Paraspinous soft tissues demonstrate no acute finding. Moderate distension of the partially visualized urinary bladder. Mild stranding about the left greater than right kidneys, better seen on prior CT. Visualized visceral structures otherwise unremarkable. Disc levels: L1-2:  Mild annular disc bulge.  No stenosis or impingement. L2-3: Mild annular disc bulge. Small posterior annular fissure. Mild facet hypertrophy. No significant canal or foraminal stenosis. L3-4: Mild annular disc bulge with disc desiccation. Moderate to advanced facet and ligament flavum hypertrophy. Resultant moderate spinal stenosis. Mild left greater than right L3 foraminal narrowing. L4-5: Negative interspace. Prior posterior fusion. No canal or foraminal stenosis. L5-S1: Anterolisthesis. Associated broad posterior pseudo disc bulge/uncovering. Prior posterior fusion. No canal or foraminal stenosis. No impingement. IMPRESSION: 1. No MRI evidence for acute abnormality within the thoracic or lumbar spine. No evidence for infection or malignancy. 2. Small right paracentral disc  protrusion at T12-L1 without stenosis or impingement. No other significant disc pathology or stenosis within the thoracic spine. 3. Mild disc bulging with advanced facet hypertrophy at L3-4 with resultant moderate spinal stenosis, with mild bilateral L3 foraminal narrowing. 4. Prior posterior fusion at L4-5 and  L5-S1 without residual or recurrent stenosis. Electronically Signed   By: Jeannine Boga M.D.   On: 01/18/2019 05:11   Mr Lumbar Spine W Wo Contrast  Result Date: 01/18/2019 CLINICAL DATA:  Initial evaluation for acute fever, back pain, altered mental status. Concern for infection or malignancy. EXAM: MRI THORACIC AND LUMBAR SPINE WITHOUT AND WITH CONTRAST TECHNIQUE: Multiplanar and multiecho pulse sequences of the thoracic and lumbar spine, were obtained without and with intravenous contrast. CONTRAST:  7 cc of Gadavist. COMPARISON:  None available. FINDINGS: MRI THORACIC SPINE FINDINGS Alignment: Mild dextroscoliosis. Alignment otherwise normal with preservation of the normal thoracic kyphosis. No listhesis or subluxation. Vertebrae: Vertebral body height maintained without evidence for acute or chronic fracture. Bone marrow signal intensity within normal limits. Few scattered benign hemangiomas noted, most prominent of which position within the T7 vertebral body and measures 1 cm. No other discrete or worrisome osseous lesions. No evidence for malignancy. Reactive marrow edema and enhancement about the anterior aspect of the T8-9 interspace felt to be degenerative in nature. No evidence for osteomyelitis discitis or septic arthritis. No other acute infection within the thoracic spine. Cord: Signal intensity within the thoracic spinal cord is normal. No epidural abscess or other collection. Prominent dorsal epidural fat noted within the midthoracic spine. Paraspinal tissues: Paraspinous soft tissues demonstrate no acute finding. Trace layering right pleural effusion with associated right basilar  atelectatic changes. Visualized lungs otherwise grossly clear. Visualized visceral structures within normal limits. Disc levels: T12-L1: Small right paracentral disc protrusion mildly indents the right ventral thecal sac (series 23, image 38). No significant stenosis or impingement. No other significant disc pathology seen within the thoracic spine. No significant facet degeneration. No canal or neural foraminal stenosis. No impingement. MRI LUMBAR SPINE FINDINGS Segmentation: Standard. Lowest well-formed disc labeled the L5-S1 level. Alignment: 4 mm anterolisthesis of L5 on S1. Trace retrolisthesis of L2 on L3 and L3 on L4. Exaggeration of the normal lumbar lordosis. Vertebrae: Vertebral body height maintained without evidence for acute or chronic fracture. Bone marrow signal intensity within normal limits. No discrete or worrisome osseous lesions. No evidence for malignancy. No abnormal marrow edema or enhancement to suggest osteomyelitis discitis or septic arthritis. Conus medullaris and cauda equina: Conus extends to the L1 level. Conus and cauda equina appear normal. No epidural abscess or other collections. Paraspinal and other soft tissues: Paraspinous soft tissues demonstrate no acute finding. Moderate distension of the partially visualized urinary bladder. Mild stranding about the left greater than right kidneys, better seen on prior CT. Visualized visceral structures otherwise unremarkable. Disc levels: L1-2:  Mild annular disc bulge.  No stenosis or impingement. L2-3: Mild annular disc bulge. Small posterior annular fissure. Mild facet hypertrophy. No significant canal or foraminal stenosis. L3-4: Mild annular disc bulge with disc desiccation. Moderate to advanced facet and ligament flavum hypertrophy. Resultant moderate spinal stenosis. Mild left greater than right L3 foraminal narrowing. L4-5: Negative interspace. Prior posterior fusion. No canal or foraminal stenosis. L5-S1: Anterolisthesis. Associated  broad posterior pseudo disc bulge/uncovering. Prior posterior fusion. No canal or foraminal stenosis. No impingement. IMPRESSION: 1. No MRI evidence for acute abnormality within the thoracic or lumbar spine. No evidence for infection or malignancy. 2. Small right paracentral disc protrusion at T12-L1 without stenosis or impingement. No other significant disc pathology or stenosis within the thoracic spine. 3. Mild disc bulging with advanced facet hypertrophy at L3-4 with resultant moderate spinal stenosis, with mild bilateral L3 foraminal narrowing. 4. Prior posterior fusion at L4-5 and L5-S1  without residual or recurrent stenosis. Electronically Signed   By: Jeannine Boga M.D.   On: 01/18/2019 05:11    Assessment and Plan:   1. Elevated troponin without chest pain  - Hs- troponin 535>>679. Likely demand in setting of acute illness. No further work up  2. CAD s/p CABG - No angina. Not on ASA due to need of anticoagulation. Continue statin.   3. Aortic stenosis s/p mechanical AVR  - Last echo in 2018 showed improved. mean gradient of 12mmg Hg.  - On coumadin for anticoagulation. INR 2.0 today. Prior hx of GIB while on Lovenox bridge. Advised not to use Lovenox injections in the future. Would avoid bridging with heparin if possible/no invasive procedure planned. - Given bacteremia and fever, need to r/o endocarditis. Echo done, pending result. +/- TEE based on further work up and echo result.   4. Sepsis 2nd to bacteremia - Evaluation per primary team. Consider ID consult.  - On broad spectrum abx  For questions or updates, please contact [place] Please consult [email or web address] for contact info under   Jarrett Soho, PA  01/18/2019 10:38 AM   Agree with note by Robbie Lis PA-C  We are asked to see Elizabeth Morton by the primary care service for mildly elevated troponins.  She has a history of CABG x2, AVR with a mechanical valve and Bentall procedure back in 2006.  She is  a patient of Dr. Meda Coffee but has not seen her in several years.  She has been feeling poorly for the last several days with nonspecific complaints.  She does deny chest pain or shortness of breath.  Blood cultures have been positive and she is on antibiotics.  Transthoracic echo shows normal LV function and what appears to be normal valve function of his has not been officially read.  Her troponins are in the 500 range and are flat.  Her exam is benign with crisp valve sounds and is soft outflow tract murmur with no stigmata of SBE.  Her INR is 2, subtherapeutic, on Coumadin oral anticoagulation.  She will most likely need a transesophageal echo to rule out SBE and ID consult for guidance on duration of antibiotic therapy.  Lorretta Harp, M.D., [employer], [facility], [place] [address] [place], [place]  [address]  [phone] 01/18/2019 11:13 AM

## 2019-01-18 NOTE — Progress Notes (Signed)
CRITICAL VALUE ALERT  Critical Value:  Lactic acid 2.5  Date & Time Notied:  01/18/19 at 0930  Provider Notified: Tawanna Solo  Orders Received/Actions taken: Awaiting response. Patient still receiving fluid bolus from earlier.

## 2019-01-18 NOTE — Progress Notes (Signed)
  Patient is a 34 female with history of coronary artery disease status post CABG, mechanical aortic valve, hypothyroidism, CVA, hypertension who was brought to the emergency department after she was found to be confused at home with fevers and chills.  In the emergency department she was found to have fever of 103 Fahrenheit.  COVID-19 screening test negative. She was also found to have elevated troponin, acute kidney injury.  Cardiology consulted.  Blood cultures this morning reveal MSSA.  ID consulted.  Started on cefazolin. Patient seen and examined the bedside this morning.  Currently she is hemodynamically stable.  Denies any complaints.  She states she feels much better.  She was afebrile this morning. Plan for TEE.  Repeat blood cultures in the a.m.  No clear source of infection at present. Patient seen by Dr. Hal Hope this morning

## 2019-01-18 NOTE — Progress Notes (Signed)
LALAH DURANGO is a 63 y.o. female patient admitted from ED awake, alert - oriented  X 4 - no acute distress noted.  VSS - Blood pressure 115/63, pulse 96, temperature 99.8 F (37.7 C), temperature source Oral, resp. rate (!) 26, height 5\' 3"  (1.6 m), weight 72.4 kg, SpO2 90 %.    IV in place, occlusive dsg intact without redness.  Orientation to room, and floor completed with information packet given to patient/family.  Patient declined safety video at this time.  Admission INP armband ID verified with patient/family, and in place.    SR up x 2, fall assessment complete, with patient and family able to verbalize understanding of risk associated with falls, and verbalized understanding to call nsg before up out of bed.    Call light within reach, patient able to voice, and demonstrate understanding. Skin to skin assessment completed with second RN; integrity documented in assessment. Skin, clean-dry- intact without evidence of bruising, or skin tears.   No evidence of skin break down noted on exam.     Will cont to eval and treat per MD orders.  Howard Pouch, RN 01/18/2019 6:22 AM

## 2019-01-18 NOTE — Progress Notes (Signed)
Pharmacy Antibiotic Note  Elizabeth Morton is a 63 y.o. female admitted on 01/17/2019 with altered mental status.  Pharmacy has been consulted for Vancomycin/Cefepime dosing for r/o sepsis. WBC WNL. Noted renal dysfunction.   Plan: Vancomycin 750 mg IV q24h >>Estimated AUC: 538 Cefepime 2g IV q12h Trend WBC, temp, renal function  F/U infectious work-up Drug levels as indicated   Height: 5\' 2"  (157.5 cm) Weight: 154 lb 1.6 oz (69.9 kg) IBW/kg (Calculated) : 50.1  Temp (24hrs), Avg:100.2 F (37.9 C), Min:97.7 F (36.5 C), Max:103.7 F (39.8 C)  Recent Labs  Lab 01/17/19 2107 01/17/19 2108 01/18/19 0004  WBC 9.0  --   --   CREATININE 1.67*  --   --   LATICACIDVEN  --  3.7* 3.3*    Estimated Creatinine Clearance: 32.4 mL/min (A) (by C-G formula based on SCr of 1.67 mg/dL (H)).    No Known Allergies  [name], PharmD, BCPS Clinical Pharmacist Phone: [phone]

## 2019-01-18 NOTE — Progress Notes (Signed)
Paged NP Schorr at [phone]; notified of lactic acid 2.9 (notified by lab at 0620). Ordered bolus administered.

## 2019-01-18 NOTE — Consult Note (Signed)
Elizabeth Morton for Infectious Disease    Date of Admission:  01/17/2019           Day 1 cefazolin       Reason for Consult: Automatic consultation for MSSA bacteremia     Assessment: She has MSSA bacteremia of unknown source and is at high risk for prosthetic aortic valve endocarditis.  We will continue cefazolin and repeat blood cultures in a.m.  She needs a TEE to help evaluate her valves and determine optimal antibiotic regimen and duration of therapy.  Plan: 1. Agree with cefazolin 2. Recommend TEE 3. Repeat blood cultures in a.m.  Principal Problem:   Bacteremia due to methicillin susceptible Staphylococcus aureus (MSSA) Active Problems:   Sepsis ([place])   S/P aortic valve replacement   Aortic stenosis   Hx of CABG   Dyslipidemia   CAD (coronary artery disease)   Hypothyroidism   Chronic hepatitis C without hepatic coma (HCC)   Acute kidney injury ([place])   Scheduled Meds: . levothyroxine  50 mcg Oral QAC breakfast  . pantoprazole  40 mg Oral Daily  . simvastatin  40 mg Oral QHS   Continuous Infusions: . sodium chloride 100 mL/hr at 01/18/19 1037  .  ceFAZolin (ANCEF) IV     PRN Meds:.acetaminophen **OR** acetaminophen, ondansetron **OR** ondansetron (ZOFRAN) IV  HPI: Elizabeth KEEVEN is a 63 y.o. female who began to feel progressively weak several days ago.  She recalls having some transient nausea, vomiting and diarrhea.  She began to have fever and developed confusion last night leading to admission.  Her temperature was 103.7 degrees.  She was started on broad empiric antibiotic therapy for sepsis.  Both admission blood cultures have grown MSSA.  No acute abnormalities were seen on chest x-ray, CT scan of the brain, abdomen and pelvis and MRI of the thoracolumbar lumbar spine.  She had no pyuria on urinalysis.  She has a history of coronary artery disease and aortic stenosis.  She underwent CABG and aortic valve replacement with a mechanical valve in 2006.   She is on chronic anticoagulation.  She was treated for chronic hepatitis C and cured last year.   Review of Systems: Review of Systems  Constitutional: Positive for chills, fever and malaise/fatigue. Negative for diaphoresis.  HENT: Positive for sore throat. Negative for congestion.   Respiratory: Negative for cough and shortness of breath.   Cardiovascular: Negative for chest pain.  Gastrointestinal: Positive for diarrhea, nausea and vomiting. Negative for abdominal pain.  Genitourinary: Negative for dysuria.  Musculoskeletal: Positive for back pain.  Skin: Negative for rash.  Neurological: Positive for weakness and headaches. Negative for focal weakness.    Past Medical History:  Diagnosis Date  . Aortic stenosis    Aortic valve replacement 2006  . CAD (coronary artery disease)    a. s/p CABG 2006 at time of AVR.  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 Dyslipidemia   . Gallstones   . GERD (gastroesophageal reflux disease)   . Heart murmur   . Hx of CABG    2006,LIMA to LAD, SVG to diagonal  . Hx of transfusion of packed red blood cells   . Hypertension   . Hypothyroidism   . Iron deficiency anemia   . Lower GI bleed 06/2015  . Pericardial effusion    Insignificant small pericardial effusion seen on echo, November, 2013  . Peripheral vascular disease ([place])  06   blood clot -stroke  . S/P aortic valve replacement    a. Bentall procedure,#21 St. Jude mechanical valve conduit with reimplantation of the coronaries 2006  . Status post colonoscopy with polypectomy    "bleeding; colonoscopy was ~ 06/18/2015"  . Stroke [facility]) 2006   no deficits   . Warfarin anticoagulation    coumadin therapy    Social History   Tobacco Use  . Smoking status: Former Smoker    Packs/day: 1.00    Years: 25.00    Pack years: 25.00    Types: Cigarettes    Quit date: 07/15/2004    Years since quitting: 14.5  . Smokeless tobacco: Never Used  Substance  Use Topics  . Alcohol use: No  . Drug use: No    Family History  Problem Relation Age of Onset  . Stroke Mother   . Heart disease Mother   . Alcohol abuse Father   . Hypertension Father   . Stroke Father   . Hyperlipidemia Father   . Stroke Brother        she has three and at least one of them has had a stroke  . Colon cancer Neg Hx    No Known Allergies  OBJECTIVE: Blood pressure 111/62, pulse (!) 101, temperature 98.6 F (37 C), temperature source Oral, resp. rate (!) 24, height 5\' 3"  (1.6 m), weight 72.4 kg, SpO2 92 %.  Physical Exam Constitutional:      Comments: She is resting quietly in bed.  She appears very weak and answers very slowly.  HENT:     Mouth/Throat:     Pharynx: No oropharyngeal exudate.  Eyes:     Conjunctiva/sclera: Conjunctivae normal.  Neck:     Musculoskeletal: Neck supple.  Cardiovascular:     Rate and Rhythm: Normal rate and regular rhythm.     Heart sounds: Murmur present.     Comments: 2/6 holosystolic murmur with crisp, mechanical valve sounds.  Healed sternotomy. Pulmonary:     Effort: Pulmonary effort is normal.     Breath sounds: Normal breath sounds.  Abdominal:     Palpations: Abdomen is soft.     Tenderness: There is no abdominal tenderness.  Musculoskeletal:        General: No swelling or tenderness.  Skin:    Findings: No rash.     Lab Results Lab Results  Component Value Date   WBC 7.0 01/18/2019   HGB 14.6 01/18/2019   HCT 42.7 01/18/2019   MCV 89.3 01/18/2019   PLT 111 (L) 01/18/2019    Lab Results  Component Value Date   CREATININE 1.42 (H) 01/18/2019   BUN 19 01/18/2019   NA 138 01/18/2019   K 3.2 (L) 01/18/2019   CL 110 01/18/2019   CO2 18 (L) 01/18/2019    Lab Results  Component Value Date   ALT 68 (H) 01/18/2019   AST 88 (H) 01/18/2019   ALKPHOS 80 01/18/2019   BILITOT 0.9 01/18/2019     Microbiology: Recent Results (from the past 240 hour(s))  Culture, blood (Routine x 2)     Status: None  (Preliminary result)   Collection Time: 01/17/19  9:00 PM   Specimen: BLOOD  Result Value Ref Range Status   Specimen Description BLOOD RIGHT HAND  Final   Special Requests   Final    BOTTLES DRAWN AEROBIC ONLY Blood Culture adequate volume   Culture  Setup Time   Final    GRAM POSITIVE COCCI IN CLUSTERS  AEROBIC BOTTLE ONLY CRITICAL VALUE NOTED.  VALUE IS CONSISTENT WITH PREVIOUSLY REPORTED AND CALLED VALUE.    Culture   Final    NO GROWTH < 12 HOURS Performed at [facility], [place] [address], [place], [place] [address]    Report Status PENDING  Incomplete  Culture, blood (Routine x 2)     Status: None (Preliminary result)   Collection Time: 01/17/19  9:08 PM   Specimen: BLOOD  Result Value Ref Range Status   Specimen Description BLOOD RIGHT ARM  Final   Special Requests   Final    BOTTLES DRAWN AEROBIC AND ANAEROBIC Blood Culture adequate volume   Culture  Setup Time   Final    GRAM POSITIVE COCCI IN CLUSTERS IN BOTH AEROBIC AND ANAEROBIC BOTTLES CRITICAL RESULT CALLED TO, READ BACK BY AND VERIFIED WITH: Cristopher Estimable PharmD 10:15 01/18/19 ([email or web address]) Performed at [facility], [place] [address], [place], [place] [address]    Culture GRAM POSITIVE COCCI  Final   Report Status PENDING  Incomplete  Blood Culture ID Panel (Reflexed)     Status: Abnormal   Collection Time: 01/17/19  9:08 PM  Result Value Ref Range Status   Enterococcus species NOT DETECTED NOT DETECTED Final   Listeria monocytogenes NOT DETECTED NOT DETECTED Final   Staphylococcus species DETECTED (A) NOT DETECTED Final    Comment: CRITICAL RESULT CALLED TO, READ BACK BY AND VERIFIED WITH: Cristopher Estimable PharmD 10:15 01/18/19 ([email or web address])    Staphylococcus aureus (BCID) DETECTED (A) NOT DETECTED Final    Comment: Methicillin (oxacillin) susceptible Staphylococcus aureus (MSSA). Preferred therapy is anti staphylococcal beta lactam antibiotic (Cefazolin or Nafcillin), unless clinically contraindicated. CRITICAL  RESULT CALLED TO, READ BACK BY AND VERIFIED WITH: Cristopher Estimable PharmD 10:15 01/18/19 ([email or web address])    Methicillin resistance NOT DETECTED NOT DETECTED Final   Streptococcus species NOT DETECTED NOT DETECTED Final   Streptococcus agalactiae NOT DETECTED NOT DETECTED Final   Streptococcus pneumoniae NOT DETECTED NOT DETECTED Final   Streptococcus pyogenes NOT DETECTED NOT DETECTED Final   Acinetobacter baumannii NOT DETECTED NOT DETECTED Final   Enterobacteriaceae species NOT DETECTED NOT DETECTED Final   Enterobacter cloacae complex NOT DETECTED NOT DETECTED Final   Escherichia coli NOT DETECTED NOT DETECTED Final   Klebsiella oxytoca NOT DETECTED NOT DETECTED Final   Klebsiella pneumoniae NOT DETECTED NOT DETECTED Final   Proteus species NOT DETECTED NOT DETECTED Final   Serratia marcescens NOT DETECTED NOT DETECTED Final   Haemophilus influenzae NOT DETECTED NOT DETECTED Final   Neisseria meningitidis NOT DETECTED NOT DETECTED Final   Pseudomonas aeruginosa NOT DETECTED NOT DETECTED Final   Candida albicans NOT DETECTED NOT DETECTED Final   Candida glabrata NOT DETECTED NOT DETECTED Final   Candida krusei NOT DETECTED NOT DETECTED Final   Candida parapsilosis NOT DETECTED NOT DETECTED Final   Candida tropicalis NOT DETECTED NOT DETECTED Final    Comment: Performed at [facility], [address] [place], [place] [address]  SARS Coronavirus 2 (CEPHEID- Performed in [place] hospital lab), Hosp Order     Status: None   Collection Time: 01/18/19  2:02 AM   Specimen: Nasopharyngeal Swab  Result Value Ref Range Status   SARS Coronavirus 2 NEGATIVE NEGATIVE Final    Comment: (NOTE) If result is NEGATIVE SARS-CoV-2 target nucleic acids are NOT DETECTED. The SARS-CoV-2 RNA is generally detectable in upper and lower  respiratory specimens during the acute phase of infection. The lowest  concentration of SARS-CoV-2  viral copies this assay can detect is 250  copies / mL. A negative  result does not preclude SARS-CoV-2 infection  and should not be used as the sole basis for treatment or other  patient management decisions.  A negative result may occur with  improper specimen collection / handling, submission of specimen other  than nasopharyngeal swab, presence of viral mutation(s) within the  areas targeted by this assay, and inadequate number of viral copies  (<250 copies / mL). A negative result must be combined with clinical  observations, patient history, and epidemiological information. If result is POSITIVE SARS-CoV-2 target nucleic acids are DETECTED. The SARS-CoV-2 RNA is generally detectable in upper and lower  respiratory specimens dur ing the acute phase of infection.  Positive  results are indicative of active infection with SARS-CoV-2.  Clinical  correlation with patient history and other diagnostic information is  necessary to determine patient infection status.  Positive results do  not rule out bacterial infection or co-infection with other viruses. If result is PRESUMPTIVE POSTIVE SARS-CoV-2 nucleic acids MAY BE PRESENT.   A presumptive positive result was obtained on the submitted specimen  and confirmed on repeat testing.  While 2019 novel coronavirus  (SARS-CoV-2) nucleic acids may be present in the submitted sample  additional confirmatory testing may be necessary for epidemiological  and / or clinical management purposes  to differentiate between  SARS-CoV-2 and other Sarbecovirus currently known to infect humans.  If clinically indicated additional testing with an alternate test  methodology [phone]) is advised. The SARS-CoV-2 RNA is generally  detectable in upper and lower respiratory sp ecimens during the acute  phase of infection. The expected result is Negative. Fact Sheet for Patients:  StrictlyIdeas.no Fact Sheet for Healthcare Providers: BankingDealers.co.za This test is not yet  approved or cleared by the [place] FDA and has been authorized for detection and/or diagnosis of SARS-CoV-2 by FDA under an Emergency Use Authorization (EUA).  This EUA will remain in effect (meaning this test can be used) for the duration of the COVID-19 declaration under Section 564(b)(1) of the Act, 21 U.S.C. section 360bbb-3(b)(1), unless the authorization is terminated or revoked sooner. Performed at [facility], [place] [address], [place], [place] [address]     Michel Bickers, [place] for [facility] [phone] pager   [phone] cell 01/18/2019, 12:12 PM

## 2019-01-18 NOTE — Progress Notes (Signed)
  Echocardiogram 2D Echocardiogram has been performed.  Elizabeth Morton 01/18/2019, 10:42 AM

## 2019-01-18 NOTE — Progress Notes (Signed)
ANTICOAGULATION CONSULT NOTE - Initial Consult  Pharmacy Consult for Dosing Warfarin Indication: Mechanical Aortic Valve  No Known Allergies  Patient Measurements: Height: 5\' 3"  (160 cm) Weight: 159 lb 9.8 oz (72.4 kg) IBW/kg (Calculated) : 52.4   Vital Signs: Temp: 98.4 F (36.9 C) (07/08 1025) Temp Source: Oral (07/08 1025) BP: 104/56 (07/08 1025) Pulse Rate: 103 (07/08 1025)  Labs: Recent Labs    01/17/19 2107 01/18/19 0004 01/18/19 0312 01/18/19 0543  HGB 15.9*  --   --  14.6  HCT 46.3*  --   --  42.7  PLT 148*  --   --  111*  APTT  --   --   --  45*  LABPROT 22.7*  --   --  22.6*  INR 2.0*  --   --  2.0*  CREATININE 1.67*  --   --  1.75*  TROPONINIHS  --  535* 679*  --     Estimated Creatinine Clearance: 32.2 mL/min (A) (by C-G formula based on SCr of 1.75 mg/dL (H)).   Medical History: Past Medical History:  Diagnosis Date  . Aortic stenosis    Aortic valve replacement 2006  . CAD (coronary artery disease)    a. s/p CABG 2006 at time of AVR.  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 Dyslipidemia   . Gallstones   . GERD (gastroesophageal reflux disease)   . Heart murmur   . Hx of CABG    2006,LIMA to LAD, SVG to diagonal  . Hx of transfusion of packed red blood cells   . Hypertension   . Hypothyroidism   . Iron deficiency anemia   . Lower GI bleed 06/2015  . Pericardial effusion    Insignificant small pericardial effusion seen on echo, November, 2013  . Peripheral vascular disease ([place]) 06   blood clot -stroke  . S/P aortic valve replacement    a. Bentall procedure,#21 St. Jude mechanical valve conduit with reimplantation of the coronaries 2006  . Status post colonoscopy with polypectomy    "bleeding; colonoscopy was ~ 06/18/2015"  . Stroke [facility]) 2006   no deficits   . Warfarin anticoagulation    coumadin therapy    Medications:  Scheduled:  . levothyroxine  50 mcg Oral QAC breakfast  . pantoprazole   40 mg Oral Daily  . simvastatin  40 mg Oral QHS  On warfarin 4.5 mg on Monday and Friday and 3 mg on all other days PTA (INR 2.0 at admission) per anticoag visits. Med Hx pending  Assessment: Elizabeth Morton is an 63 y.o. female with a PMH of CAD s/p CABGx2, mechanical aortic valve, hypothydroidism, CVA, HTN, and dyslipidemia. Patient has had a severe bleed on Lovenox requiring FFP and vitamin K during elective ventral hernial repair in 2017. She presented to [facility] on 01/17/2019 with a chief complaint of confusion, fever, and chills. Patient is now on sepsis protocol due to MSSA detected in the blood. Echo pending to r/o endocarditis. Patient's case is further complicated by AKI, mild hypokalemia, hyponatremia, hyperglycemia, and elevated troponin in the setting of hx of CABG. Heparin ordered initially during sepsis workup but changed to warfarin per Dr. Tawanna Solo since INR is therapeutic. PTA warfarin to be resumed due to high risk of clotting from mechanical aortic valve (last taken 4.5 mg 07/06 at home).  H/H decreased from 15.9/46.3 to 14.6/42.7 but still WNL. Platelets are low at 111, monitor closely.  Goal of  Therapy:  INR 2-3 per outpatient goal Monitor platelets by anticoagulation protocol: Yes   Plan:  Give Warfarin 4.5 mg X 1 today since patient missed dose yesterday. Check PT/INR daily Monitor H&H and platelets with CBC in the morning Monitor for signs/symptoms of bleeding  Sherren Kerns, PharmD PGY1 [place] Resident [phone] 01/18/2019,10:37 AM .

## 2019-01-19 ENCOUNTER — Encounter (HOSPITAL_COMMUNITY): Payer: Self-pay | Admitting: Certified Registered"

## 2019-01-19 ENCOUNTER — Encounter (HOSPITAL_COMMUNITY)
Admission: EM | Disposition: A | Discharge status: Home / Self Care | Payer: Self-pay | Source: Home / Self Care | Attending: Internal Medicine

## 2019-01-19 ENCOUNTER — Inpatient Hospital Stay (HOSPITAL_COMMUNITY): Payer: 59 | Admitting: Certified Registered"

## 2019-01-19 ENCOUNTER — Inpatient Hospital Stay (HOSPITAL_COMMUNITY): Payer: 59

## 2019-01-19 DIAGNOSIS — R7881 Bacteremia: Secondary | ICD-10-CM

## 2019-01-19 DIAGNOSIS — I340 Nonrheumatic mitral (valve) insufficiency: Secondary | ICD-10-CM

## 2019-01-19 HISTORY — DX: Bacteremia: R78.81

## 2019-01-19 HISTORY — PX: TEE WITHOUT CARDIOVERSION: SHX5443

## 2019-01-19 LAB — PROTIME-INR
INR: 2.9 — ABNORMAL HIGH (ref 0.8–1.2)
Prothrombin Time: 29.6 seconds — ABNORMAL HIGH (ref 11.4–15.2)

## 2019-01-19 LAB — BASIC METABOLIC PANEL
Anion gap: 9 (ref 5–15)
BUN: 15 mg/dL (ref 8–23)
CO2: 18 mmol/L — ABNORMAL LOW (ref 22–32)
Calcium: 8 mg/dL — ABNORMAL LOW (ref 8.9–10.3)
Chloride: 113 mmol/L — ABNORMAL HIGH (ref 98–111)
Creatinine, Ser: 1.17 mg/dL — ABNORMAL HIGH (ref 0.44–1.00)
GFR calc Af Amer: 58 mL/min — ABNORMAL LOW (ref >60)
GFR calc non Af Amer: 50 mL/min — ABNORMAL LOW (ref >60)
Glucose, Bld: 111 mg/dL — ABNORMAL HIGH (ref 70–99)
Potassium: 3.3 mmol/L — ABNORMAL LOW (ref 3.5–5.1)
Sodium: 140 mmol/L (ref 135–145)

## 2019-01-19 LAB — HIV ANTIBODY (ROUTINE TESTING W REFLEX): HIV Screen 4th Generation wRfx: NONREACTIVE

## 2019-01-19 SURGERY — ECHOCARDIOGRAM, TRANSESOPHAGEAL
Anesthesia: Monitor Anesthesia Care

## 2019-01-19 MED ORDER — SODIUM CHLORIDE 0.9 % IV SOLN
INTRAVENOUS | Status: DC | PRN
  Administered 2019-01-19 – 2019-01-20 (×2): 250 mL via INTRAVENOUS

## 2019-01-19 MED ORDER — LIDOCAINE 2% (20 MG/ML) 5 ML SYRINGE
INTRAMUSCULAR | Status: DC | PRN
  Administered 2019-01-19: 60 mg via INTRAVENOUS

## 2019-01-19 MED ORDER — LIP MEDEX EX OINT
1.0000 "application " | TOPICAL_OINTMENT | CUTANEOUS | Status: DC | PRN
  Filled 2019-01-19: qty 7

## 2019-01-19 MED ORDER — SODIUM CHLORIDE 0.9 % IV SOLN: INTRAVENOUS | Status: DC

## 2019-01-19 MED ORDER — PROPOFOL 500 MG/50ML IV EMUL
INTRAVENOUS | Status: DC | PRN
  Administered 2019-01-19: 125 ug/kg/min via INTRAVENOUS

## 2019-01-19 MED ORDER — PHENYLEPHRINE 40 MCG/ML (10ML) SYRINGE FOR IV PUSH (FOR BLOOD PRESSURE SUPPORT)
PREFILLED_SYRINGE | INTRAVENOUS | Status: DC | PRN
  Administered 2019-01-19: 120 ug via INTRAVENOUS
  Administered 2019-01-19 (×2): 80 ug via INTRAVENOUS

## 2019-01-19 MED ORDER — WARFARIN SODIUM 3 MG PO TABS
3.0000 mg | ORAL_TABLET | Freq: Once | ORAL | Status: AC
  Administered 2019-01-19: 3 mg via ORAL
  Filled 2019-01-19: qty 1

## 2019-01-19 MED ORDER — POTASSIUM CHLORIDE CRYS ER 20 MEQ PO TBCR
40.0000 meq | EXTENDED_RELEASE_TABLET | Freq: Once | ORAL | Status: AC
  Administered 2019-01-19: 40 meq via ORAL
  Filled 2019-01-19: qty 2

## 2019-01-19 MED ORDER — PROPOFOL 10 MG/ML IV BOLUS
INTRAVENOUS | Status: DC | PRN
  Administered 2019-01-19: 20 mg via INTRAVENOUS

## 2019-01-19 NOTE — Progress Notes (Signed)
ANTICOAGULATION CONSULT NOTE - Follow Up Consult  Pharmacy Consult for Warfarin Indication: Mechanical Aortic Valve  No Known Allergies  Patient Measurements: Height: 5\' 3"  (160 cm) Weight: 175 lb 4.3 oz (79.5 kg) IBW/kg (Calculated) : 52.4  Vital Signs: Temp: 98.7 F (37.1 C) (07/09 0926) Temp Source: Temporal (07/09 1021) BP: 122/49 (07/09 1041) Pulse Rate: 106 (07/09 1041)  Labs: Recent Labs    01/17/19 2107 01/18/19 0004 01/18/19 0312 01/18/19 0543 01/18/19 1115 01/19/19 0404  HGB 15.9*  --   --  14.6  --   --   HCT 46.3*  --   --  42.7  --   --   PLT 148*  --   --  111*  --   --   APTT  --   --   --  45*  --   --   LABPROT 22.7*  --   --  22.6*  --  29.6*  INR 2.0*  --   --  2.0*  --  2.9*  CREATININE 1.67*  --   --  1.75* 1.42* 1.17*  TROPONINIHS  --  535* 679*  --   --   --     Estimated Creatinine Clearance: 50.4 mL/min (A) (by C-G formula based on SCr of 1.17 mg/dL (H)).  Assessment: 47 yof with PMH of CAD s/p CABGx2, mechanical aortic valve, hypothydroidism, CVA, HTN, and dyslipidemia. She presented to [facility] on 7/7/2020with a chief complaint of confusion, fever, and chills.  Patient is now on sepsis protocol due to MSSA detected in the blood. TEE pending to r/o endocarditis. Patient's case is further complicated by AKI, mild hypokalemia, hyponatremia, hyperglycemia, and elevated troponin in the setting of hx of CABG.   Heparin ordered initially during sepsis workup but changed to warfarin per Dr. Tawanna Solo since INR was therapeutic at 2.0. Resumed warfarin with 4.5 mg, INR 2.0>>2.9. PLT trended down, 148>111.  PTA warfarin 4.5 mg Monday & Friday, 3 mg all other days  Goal of Therapy:  INR 2-3 Monitor platelets by anticoagulation protocol: Yes   Plan:  Warfarin 3 mg x1 Daily PT/INR CBC in AM to F/U PLT count  Deshawn Skelley L. Devin Going, PharmD, [place] PGY1 Pharmacy Resident [employer]: [phone]  Mobile: [phone] 01/19/19 12:15 PM  Please check  AMION for all [place] phone numbers After 10:00 PM, call the [place] [phone]

## 2019-01-19 NOTE — Progress Notes (Signed)
PHARMACY CONSULT NOTE FOR:  OUTPATIENT  PARENTERAL ANTIBIOTIC THERAPY (OPAT)  Indication: MSSA bacteremia Regimen: Cefazolin 2 gm IV Q 8 hours  End date: 02/14/2019  IV antibiotic discharge orders are pended. To discharging provider:  please sign these orders via discharge navigator,  Select New Orders & click on the button choice - Manage This Unsigned Work.     Thank you for allowing pharmacy to be a part of this patient's care.  Jimmy Footman, PharmD, BCPS, BCIDP Infectious Diseases Clinical Pharmacist Phone: [phone] 01/19/2019, 12:57 PM

## 2019-01-19 NOTE — Anesthesia Preprocedure Evaluation (Signed)
Anesthesia Evaluation  Patient identified by MRN, date of birth, ID band Patient awake    Reviewed: Allergy & Precautions, H&P , NPO status , Patient's Chart, lab work & pertinent test results  Airway Mallampati: II  TM Distance: >3 FB Neck ROM: Full    Dental no notable dental hx. (+) Edentulous Upper, Edentulous Lower, Dental Advisory Given   Pulmonary neg pulmonary ROS, former smoker,    Pulmonary exam normal breath sounds clear to auscultation       Cardiovascular hypertension, Pt. on medications and Pt. on home beta blockers + CAD, + CABG, + Peripheral Vascular Disease and +CHF  + Valvular Problems/Murmurs  Rhythm:Regular Rate:Normal     Neuro/Psych CVA negative psych ROS   GI/Hepatic Neg liver ROS, GERD  ,  Endo/Other  Hypothyroidism   Renal/GU Renal disease  negative genitourinary   Musculoskeletal   Abdominal   Peds  Hematology  (+) Blood dyscrasia, anemia ,   Anesthesia Other Findings   Reproductive/Obstetrics negative OB ROS                             Anesthesia Physical Anesthesia Plan  ASA: III  Anesthesia Plan: MAC   Post-op Pain Management:    Induction: Intravenous  PONV Risk Score and Plan: 2 and Propofol infusion and Treatment may vary due to age or medical condition  Airway Management Planned: Nasal Cannula  Additional Equipment:   Intra-op Plan:   Post-operative Plan:   Informed Consent: I have reviewed the patients History and Physical, chart, labs and discussed the procedure including the risks, benefits and alternatives for the proposed anesthesia with the patient or authorized representative who has indicated his/her understanding and acceptance.     Dental advisory given  Plan Discussed with: CRNA  Anesthesia Plan Comments:         Anesthesia Quick Evaluation

## 2019-01-19 NOTE — Progress Notes (Signed)
Notified Dr. Tawanna Solo via test of MEWs 2. Patient has been tachypneic this morning.  No distress. Will continue to monitor.

## 2019-01-19 NOTE — Transfer of Care (Signed)
Immediate Anesthesia Transfer of Care Note  Patient: Elizabeth Morton  Procedure(s) Performed: TRANSESOPHAGEAL ECHOCARDIOGRAM (TEE) (N/A )  Patient Location: PACU  Anesthesia Type:MAC  Level of Consciousness: awake, alert  and oriented  Airway & Oxygen Therapy: Patient Spontanous Breathing  Post-op Assessment: Report given to RN and Post -op Vital signs reviewed and stable  Post vital signs: Reviewed and stable  Last Vitals:  Vitals Value Taken Time  BP 108/51 01/19/19 1021  Temp    Pulse 82 01/19/19 1021  Resp 25 01/19/19 1021  SpO2 94 % 01/19/19 1021    Last Pain:  Vitals:   01/19/19 1021  TempSrc: Temporal  PainSc: 0-No pain         Complications: No apparent anesthesia complications

## 2019-01-19 NOTE — Interval H&P Note (Signed)
History and Physical Interval Note:  01/19/2019 9:10 AM  Elizabeth Morton  has presented today for surgery, with the diagnosis of bacteremia--aortic valve.  The various methods of treatment have been discussed with the patient and family. After consideration of risks, benefits and other options for treatment, the patient has consented to  Procedure(s): TRANSESOPHAGEAL ECHOCARDIOGRAM (TEE) (N/A) as a surgical intervention.  The patient's history has been reviewed, patient examined, no change in status, stable for surgery.  I have reviewed the patient's chart and labs.  Questions were answered to the patient's satisfaction.     Kirk Ruths

## 2019-01-19 NOTE — CV Procedure (Signed)
    Transesophageal Echocardiogram Note  CUMA POLYAKOV [id number] 1956/04/19  Procedure: Transesophageal Echocardiogram Indications: bactermia  Procedure Details Consent: Obtained Time Out: Verified patient identification, verified procedure, site/side was marked, verified correct patient position, special equipment/implants available, Radiology Safety Procedures followed,  medications/allergies/relevent history reviewed, required imaging and test results available.  Performed  Medications:  Pt sedated by anesthesia with lidocaine 60 mg and diprovan 183 mg IV total.  Normal LV function; s/p AVR with trace AI; moderate MR; no vegetations; full report to follow.   Complications: No apparent complications Patient did tolerate procedure well.  Kirk Ruths, MD

## 2019-01-19 NOTE — Progress Notes (Signed)
PROGRESS NOTE    [place] Morton  RJJ:[id number] DOB: 02-Mar-1956 DOA: 01/17/2019 PCP: Levin Erp, MD   Brief Narrative:  Patient is a 22 female with history of coronary artery disease status post CABG, mechanical aortic valve, hypothyroidism, CVA, hypertension who was brought to the emergency department after she was found to be confused at home with fevers and chills.  In the emergency department she was found to have fever of 103 Fahrenheit.  COVID-19 screening test negative. She was also found to have elevated troponin, acute kidney injury.  Cardiology consulted.  Blood cultures this morning reveal MSSA.  ID consulted.  Started on cefazolin. She went for TEE today which did not show any vegetation.  If her blood cultures remain negative tomorrow, she can undergo PICC line placement and be discharged home with home health for completion of antibiotic therapy.  Assessment & Plan:   Principal Problem:   Bacteremia due to methicillin susceptible Staphylococcus aureus (MSSA) Active Problems:   Hx of CABG   Dyslipidemia   CAD (coronary artery disease)   S/P aortic valve replacement   Aortic stenosis   Hypothyroidism   Chronic hepatitis C without hepatic coma (HCC)   Sepsis (HCC)   Acute kidney injury ([place])   Thrombocytopenia (HCC)   MSSA bacteremia: Unclear source.  Imagings negative for any infection.  Started on cefazolin.  Underwent TEE which did not show any vegetation. ID was following.  If her blood cultures remain negative tomorrow, she can undergo PICC line placement and be discharged home with home health for completion of antibiotic therapy.  Recommended IV cefazolin for 4 weeks.  History of mechanical aortic valve replacement: In 2006.  On warfarin.  Follows with cardiology as an outpatient .  Continue to monitor INR.  Hypothyroidism: Continue Synthyroid  Acute kidney injury: Significantly improved with IV fluids.  IV fluids discontinued today.  History of CVA: Continue  statin  Hypokalemia: Being supplemented  Elevated troponin: Thought to be history with sepsis.  Cardiology was following.  No plan for further intervention.  Denies any chest pain.  Echocardiogram showed normal ejection fraction.  History of coronary artery disease: Status post CABG x2.  Denies any chest pain.           DVT prophylaxis: Coumadin Code Status: Full code Family Communication: None Disposition Plan: Likely tomorrow if blood cultures remain negative.  Needs to have PICC line before discharge   Consultants: ID, cardiology  Procedures: TEE  Antimicrobials:  Anti-infectives (From admission, onward)   Start     Dose/Rate Route Frequency Ordered Stop   01/18/19 2200  vancomycin (VANCOCIN) IVPB 750 mg/150 ml premix  Status:  Discontinued     750 mg 150 mL/hr over 60 Minutes Intravenous Every 24 hours 01/18/19 0227 01/18/19 1019   01/18/19 2000  ceFAZolin (ANCEF) IVPB 2g/100 mL premix     2 g 200 mL/hr over 30 Minutes Intravenous Every 8 hours 01/18/19 1019     01/18/19 1000  ceFEPIme (MAXIPIME) 2 g in sodium chloride 0.9 % 100 mL IVPB  Status:  Discontinued     2 g 200 mL/hr over 30 Minutes Intravenous Every 12 hours 01/18/19 0227 01/18/19 1019   01/18/19 1000  metroNIDAZOLE (FLAGYL) IVPB 500 mg  Status:  Discontinued     500 mg 100 mL/hr over 60 Minutes Intravenous Every 8 hours 01/18/19 0950 01/18/19 1019   01/18/19 0000  ceFEPIme (MAXIPIME) 2 g in sodium chloride 0.9 % 100 mL IVPB  2 g 200 mL/hr over 30 Minutes Intravenous  Once 01/17/19 2358 01/18/19 0132   01/18/19 0000  vancomycin (VANCOCIN) IVPB 1000 mg/200 mL premix     1,000 mg 200 mL/hr over 60 Minutes Intravenous  Once 01/17/19 2358 01/18/19 0237   01/17/19 2315  ceFEPIme (MAXIPIME) 2 g in sodium chloride 0.9 % 100 mL IVPB  Status:  Discontinued     2 g 200 mL/hr over 30 Minutes Intravenous  Once 01/17/19 2306 01/17/19 2311   01/17/19 2315  metroNIDAZOLE (FLAGYL) IVPB 500 mg     500 mg 100 mL/hr  over 60 Minutes Intravenous  Once 01/17/19 2306 01/18/19 0210   01/17/19 2315  vancomycin (VANCOCIN) IVPB 1000 mg/200 mL premix  Status:  Discontinued     1,000 mg 200 mL/hr over 60 Minutes Intravenous  Once 01/17/19 2306 01/17/19 2310      Subjective:  Patient seen and examined the bedside this morning.  Hemodynamically stable.  Afebrile.  Denies any new complaints at present.  Objective: Vitals:   01/19/19 1021 01/19/19 1031 01/19/19 1041 01/19/19 1104  BP: (!) 108/51 (!) 110/49 (!) 122/49 127/72  Pulse: 82 97 (!) 106 100  Resp: (!) 25 (!) 26 (!) 28 (!) 24  Temp:    97.9 F (36.6 C)  TempSrc: Temporal     SpO2: 94% 99% 96% 96%  Weight:      Height:        Intake/Output Summary (Last 24 hours) at 01/19/2019 1339 Last data filed at 01/19/2019 1014 Gross per 24 hour  Intake 1578.33 ml  Output 1350 ml  Net 228.33 ml   Filed Weights   01/17/19 2316 01/18/19 0517 01/19/19 0415  Weight: 69.9 kg 72.4 kg 79.5 kg    Examination:  General exam: Appears calm and comfortable ,Not in distress,obese HEENT:PERRL,Oral mucosa moist, Ear/Nose normal on gross exam Respiratory system: Bilateral equal air entry, normal vesicular breath sounds, no wheezes or crackles  Cardiovascular system: S1 & S2 heard, RRR. No JVD, murmurs, rubs, gallops or clicks. No pedal edema. Gastrointestinal system: Abdomen is nondistended, soft and nontender. No organomegaly or masses felt. Normal bowel sounds heard. Central nervous system: Alert and oriented. No focal neurological deficits. Extremities: No edema, no clubbing ,no cyanosis, distal peripheral pulses palpable. Skin: No rashes, lesions or ulcers,no icterus ,no pallor  Data Reviewed: I have personally reviewed following labs and imaging studies  CBC: Recent Labs  Lab 01/17/19 2107 01/18/19 0543  WBC 9.0 7.0  NEUTROABS 8.2* 6.2  HGB 15.9* 14.6  HCT 46.3* 42.7  MCV 89.0 89.3  PLT 148* 097*   Basic Metabolic Panel: Recent Labs  Lab  01/17/19 2107 01/18/19 0543 01/18/19 1115 01/19/19 0404  NA 134* 136 138 140  K 3.4* 3.5 3.2* 3.3*  CL 103 105 110 113*  CO2 17* 19* 18* 18*  GLUCOSE 186* 128* 130* 111*  BUN 17 21 19 15   CREATININE 1.67* 1.75* 1.42* 1.17*  CALCIUM 9.3 8.5* 8.1* 8.0*   GFR: Estimated Creatinine Clearance: 50.4 mL/min (A) (by C-G formula based on SCr of 1.17 mg/dL (H)). Liver Function Tests: Recent Labs  Lab 01/17/19 2107 01/18/19 0543  AST 133* 88*  ALT 96* 68*  ALKPHOS 102 80  BILITOT 1.5* 0.9  PROT 7.6 6.3*  ALBUMIN 3.9 3.1*   No results for input(s): LIPASE, AMYLASE in the last 168 hours. No results for input(s): AMMONIA in the last 168 hours. Coagulation Profile: Recent Labs  Lab 01/17/19 2107 01/18/19 0543 01/19/19 0404  INR 2.0* 2.0* 2.9*   Cardiac Enzymes: No results for input(s): CKTOTAL, CKMB, CKMBINDEX, TROPONINI in the last 168 hours. BNP (last 3 results) No results for input(s): PROBNP in the last 8760 hours. HbA1C: Recent Labs    01/18/19 0540  HGBA1C 5.4   CBG: No results for input(s): GLUCAP in the last 168 hours. Lipid Profile: No results for input(s): CHOL, HDL, LDLCALC, TRIG, CHOLHDL, LDLDIRECT in the last 72 hours. Thyroid Function Tests: No results for input(s): TSH, T4TOTAL, FREET4, T3FREE, THYROIDAB in the last 72 hours. Anemia Panel: No results for input(s): VITAMINB12, FOLATE, FERRITIN, TIBC, IRON, RETICCTPCT in the last 72 hours. Sepsis Labs: Recent Labs  Lab 01/18/19 0004 01/18/19 0543 01/18/19 0842 01/18/19 1115  PROCALCITON  --  108.03  --   --   LATICACIDVEN 3.3* 2.9* 2.4* 1.5    Recent Results (from the past 240 hour(s))  Culture, blood (Routine x 2)     Status: Abnormal (Preliminary result)   Collection Time: 01/17/19  9:00 PM   Specimen: BLOOD  Result Value Ref Range Status   Specimen Description BLOOD RIGHT HAND  Final   Special Requests   Final    BOTTLES DRAWN AEROBIC ONLY Blood Culture adequate volume   Culture  Setup Time    Final    GRAM POSITIVE COCCI IN CLUSTERS AEROBIC BOTTLE ONLY CRITICAL VALUE NOTED.  VALUE IS CONSISTENT WITH PREVIOUSLY REPORTED AND CALLED VALUE.    Culture (A)  Final    STAPHYLOCOCCUS AUREUS SUSCEPTIBILITIES TO FOLLOW Performed at [facility], [place] [address], [place], [place] [address]    Report Status PENDING  Incomplete  Culture, blood (Routine x 2)     Status: Abnormal (Preliminary result)   Collection Time: 01/17/19  9:08 PM   Specimen: BLOOD  Result Value Ref Range Status   Specimen Description BLOOD RIGHT ARM  Final   Special Requests   Final    BOTTLES DRAWN AEROBIC AND ANAEROBIC Blood Culture adequate volume   Culture  Setup Time   Final    GRAM POSITIVE COCCI IN CLUSTERS IN BOTH AEROBIC AND ANAEROBIC BOTTLES CRITICAL RESULT CALLED TO, READ BACK BY AND VERIFIED WITH: Cristopher Estimable PharmD 10:15 01/18/19 ([email or web address])    Culture (A)  Final    STAPHYLOCOCCUS AUREUS SUSCEPTIBILITIES TO FOLLOW Performed at [facility], [place] [address], [place], [place] [address]    Report Status PENDING  Incomplete  Blood Culture ID Panel (Reflexed)     Status: Abnormal   Collection Time: 01/17/19  9:08 PM  Result Value Ref Range Status   Enterococcus species NOT DETECTED NOT DETECTED Final   Listeria monocytogenes NOT DETECTED NOT DETECTED Final   Staphylococcus species DETECTED (A) NOT DETECTED Final    Comment: CRITICAL RESULT CALLED TO, READ BACK BY AND VERIFIED WITH: Cristopher Estimable PharmD 10:15 01/18/19 ([email or web address])    Staphylococcus aureus (BCID) DETECTED (A) NOT DETECTED Final    Comment: Methicillin (oxacillin) susceptible Staphylococcus aureus (MSSA). Preferred therapy is anti staphylococcal beta lactam antibiotic (Cefazolin or Nafcillin), unless clinically contraindicated. CRITICAL RESULT CALLED TO, READ BACK BY AND VERIFIED WITH: Cristopher Estimable PharmD 10:15 01/18/19 ([email or web address])    Methicillin resistance NOT DETECTED NOT DETECTED Final   Streptococcus species NOT DETECTED NOT  DETECTED Final   Streptococcus agalactiae NOT DETECTED NOT DETECTED Final   Streptococcus pneumoniae NOT DETECTED NOT DETECTED Final   Streptococcus pyogenes NOT DETECTED NOT DETECTED Final   Acinetobacter baumannii NOT DETECTED NOT DETECTED Final   Enterobacteriaceae species  NOT DETECTED NOT DETECTED Final   Enterobacter cloacae complex NOT DETECTED NOT DETECTED Final   Escherichia coli NOT DETECTED NOT DETECTED Final   Klebsiella oxytoca NOT DETECTED NOT DETECTED Final   Klebsiella pneumoniae NOT DETECTED NOT DETECTED Final   Proteus species NOT DETECTED NOT DETECTED Final   Serratia marcescens NOT DETECTED NOT DETECTED Final   Haemophilus influenzae NOT DETECTED NOT DETECTED Final   Neisseria meningitidis NOT DETECTED NOT DETECTED Final   Pseudomonas aeruginosa NOT DETECTED NOT DETECTED Final   Candida albicans NOT DETECTED NOT DETECTED Final   Candida glabrata NOT DETECTED NOT DETECTED Final   Candida krusei NOT DETECTED NOT DETECTED Final   Candida parapsilosis NOT DETECTED NOT DETECTED Final   Candida tropicalis NOT DETECTED NOT DETECTED Final    Comment: Performed at [facility], [place] [address] [place], [place] [address]  SARS Coronavirus 2 (CEPHEID- Performed in [facility] lab), Hosp Order     Status: None   Collection Time: 01/18/19  2:02 AM   Specimen: Nasopharyngeal Swab  Result Value Ref Range Status   SARS Coronavirus 2 NEGATIVE NEGATIVE Final    Comment: (NOTE) If result is NEGATIVE SARS-CoV-2 target nucleic acids are NOT DETECTED. The SARS-CoV-2 RNA is generally detectable in upper and lower  respiratory specimens during the acute phase of infection. The lowest  concentration of SARS-CoV-2 viral copies this assay can detect is 250  copies / mL. A negative result does not preclude SARS-CoV-2 infection  and should not be used as the sole basis for treatment or other  patient management decisions.  A negative result may occur with  improper specimen  collection / handling, submission of specimen other  than nasopharyngeal swab, presence of viral mutation(s) within the  areas targeted by this assay, and inadequate number of viral copies  (<250 copies / mL). A negative result must be combined with clinical  observations, patient history, and epidemiological information. If result is POSITIVE SARS-CoV-2 target nucleic acids are DETECTED. The SARS-CoV-2 RNA is generally detectable in upper and lower  respiratory specimens dur ing the acute phase of infection.  Positive  results are indicative of active infection with SARS-CoV-2.  Clinical  correlation with patient history and other diagnostic information is  necessary to determine patient infection status.  Positive results do  not rule out bacterial infection or co-infection with other viruses. If result is PRESUMPTIVE POSTIVE SARS-CoV-2 nucleic acids MAY BE PRESENT.   A presumptive positive result was obtained on the submitted specimen  and confirmed on repeat testing.  While 2019 novel coronavirus  (SARS-CoV-2) nucleic acids may be present in the submitted sample  additional confirmatory testing may be necessary for epidemiological  and / or clinical management purposes  to differentiate between  SARS-CoV-2 and other Sarbecovirus currently known to infect humans.  If clinically indicated additional testing with an alternate test  methodology [phone]) is advised. The SARS-CoV-2 RNA is generally  detectable in upper and lower respiratory sp ecimens during the acute  phase of infection. The expected result is Negative. Fact Sheet for Patients:  StrictlyIdeas.no Fact Sheet for Healthcare Providers: BankingDealers.co.za This test is not yet approved or cleared by the [place] FDA and has been authorized for detection and/or diagnosis of SARS-CoV-2 by FDA under an Emergency Use Authorization (EUA).  This EUA will remain in effect  (meaning this test can be used) for the duration of the COVID-19 declaration under Section 564(b)(1) of the Act, 21 U.S.C. section 360bbb-3(b)(1), unless  the authorization is terminated or revoked sooner. Performed at [facility], [place] [address], [place], [place] [address]          Radiology Studies: Ct Abdomen Pelvis Wo Contrast  Result Date: 01/18/2019 CLINICAL DATA:  Fever and back pain EXAM: CT ABDOMEN AND PELVIS WITHOUT CONTRAST TECHNIQUE: Multidetector CT imaging of the abdomen and pelvis was performed following the standard protocol without IV contrast. COMPARISON:  CT dated May 28, 2016. FINDINGS: Lower chest: The lung bases are clear. The heart size is normal. Hepatobiliary: There is likely borderline hepatic steatosis. Status post cholecystectomy.There is no biliary ductal dilation. Pancreas: Normal contours without ductal dilatation. No peripancreatic fluid collection. Spleen: No splenic laceration or hematoma. Adrenals/Urinary Tract: --Adrenal glands: No adrenal hemorrhage. --Right kidney/ureter: No hydronephrosis or perinephric hematoma. --Left kidney/ureter: There is some fat stranding about the left kidney which is slightly increased from prior study. There may be punctate nonobstructing stones in the interpolar region of the left kidney. There is no left-sided hydronephrosis. --Urinary bladder: Unremarkable. Stomach/Bowel: --Stomach/Duodenum: There is a moderate-sized hiatal hernia. --Small bowel: No dilatation or inflammation. --Colon: The colon is decompressed which limits evaluation. There is no definite CT evidence for colitis. --Appendix: Normal. Vascular/Lymphatic: Atherosclerotic calcification is present within the non-aneurysmal abdominal aorta, without hemodynamically significant stenosis. --No retroperitoneal lymphadenopathy. --No mesenteric lymphadenopathy. --No pelvic or inguinal lymphadenopathy. Reproductive: Unremarkable Other: No ascites or free air. There is  a small fat containing ventral wall hernia. Musculoskeletal. Multilevel degenerative disc disease and facet arthrosis. No bony spinal canal stenosis. IMPRESSION: 1. Slight interval increase in fat stranding about the left kidney without a clear identifiable cause. There is no left-sided hydronephrosis. There is a punctate nonobstructing stone in the interpolar region of the left kidney. Correlation with urinalysis is recommended to help exclude an ascending urinary tract infection. 2. Multiple chronic findings as above. Electronically Signed   By: Constance Holster M.D.   On: 01/18/2019 00:53   Dg Chest 2 View  Result Date: 01/17/2019 CLINICAL DATA:  Suspected sepsis. Fever, back pain. History of hypertension, CAD, CABG. EXAM: CHEST - 2 VIEW COMPARISON:  Chest radiograph 05/28/2016 FINDINGS: Postsurgical changes from prior CABG and aortic valve replacement. Sternotomy wires remain intact and aligned. Cardiomediastinal contours are otherwise unremarkable. Streaky areas of subsegmental atelectasis at the bases. Lungs are otherwise clear. Multilevel discogenic and changes in the spine. Cholecystectomy clips in the right upper quadrant. No acute osseous or soft tissue abnormality. IMPRESSION: Bibasilar atelectasis.  No other acute cardiopulmonary abnormality. Electronically Signed   By: MD Lovena Le   On: 01/17/2019 21:23   Ct Head Wo Contrast  Result Date: 01/18/2019 CLINICAL DATA:  Altered level of consciousness. EXAM: CT HEAD WITHOUT CONTRAST TECHNIQUE: Contiguous axial images were obtained from the base of the skull through the vertex without intravenous contrast. COMPARISON:  None. FINDINGS: Brain: No evidence of acute infarction, hemorrhage, hydrocephalus, extra-axial collection or mass lesion/mass effect. There is an old right MCA territory infarct. There is volume loss with chronic microvascular ischemic changes. Vascular: No hyperdense vessel or unexpected calcification. Skull: Normal. Negative for  fracture or focal lesion. Sinuses/Orbits: No acute finding. Other: None. IMPRESSION: 1. No acute intracranial abnormality. 2. Old right MCA territory infarct. Electronically Signed   By: Constance Holster M.D.   On: 01/18/2019 00:58   Mr Thoracic Spine W Wo Contrast  Result Date: 01/18/2019 CLINICAL DATA:  Initial evaluation for acute fever, back pain, altered mental status. Concern for infection or malignancy. EXAM: MRI THORACIC  AND LUMBAR SPINE WITHOUT AND WITH CONTRAST TECHNIQUE: Multiplanar and multiecho pulse sequences of the thoracic and lumbar spine, were obtained without and with intravenous contrast. CONTRAST:  7 cc of Gadavist. COMPARISON:  None available. FINDINGS: MRI THORACIC SPINE FINDINGS Alignment: Mild dextroscoliosis. Alignment otherwise normal with preservation of the normal thoracic kyphosis. No listhesis or subluxation. Vertebrae: Vertebral body height maintained without evidence for acute or chronic fracture. Bone marrow signal intensity within normal limits. Few scattered benign hemangiomas noted, most prominent of which position within the T7 vertebral body and measures 1 cm. No other discrete or worrisome osseous lesions. No evidence for malignancy. Reactive marrow edema and enhancement about the anterior aspect of the T8-9 interspace felt to be degenerative in nature. No evidence for osteomyelitis discitis or septic arthritis. No other acute infection within the thoracic spine. Cord: Signal intensity within the thoracic spinal cord is normal. No epidural abscess or other collection. Prominent dorsal epidural fat noted within the midthoracic spine. Paraspinal tissues: Paraspinous soft tissues demonstrate no acute finding. Trace layering right pleural effusion with associated right basilar atelectatic changes. Visualized lungs otherwise grossly clear. Visualized visceral structures within normal limits. Disc levels: T12-L1: Small right paracentral disc protrusion mildly indents the right  ventral thecal sac (series 23, image 38). No significant stenosis or impingement. No other significant disc pathology seen within the thoracic spine. No significant facet degeneration. No canal or neural foraminal stenosis. No impingement. MRI LUMBAR SPINE FINDINGS Segmentation: Standard. Lowest well-formed disc labeled the L5-S1 level. Alignment: 4 mm anterolisthesis of L5 on S1. Trace retrolisthesis of L2 on L3 and L3 on L4. Exaggeration of the normal lumbar lordosis. Vertebrae: Vertebral body height maintained without evidence for acute or chronic fracture. Bone marrow signal intensity within normal limits. No discrete or worrisome osseous lesions. No evidence for malignancy. No abnormal marrow edema or enhancement to suggest osteomyelitis discitis or septic arthritis. Conus medullaris and cauda equina: Conus extends to the L1 level. Conus and cauda equina appear normal. No epidural abscess or other collections. Paraspinal and other soft tissues: Paraspinous soft tissues demonstrate no acute finding. Moderate distension of the partially visualized urinary bladder. Mild stranding about the left greater than right kidneys, better seen on prior CT. Visualized visceral structures otherwise unremarkable. Disc levels: L1-2:  Mild annular disc bulge.  No stenosis or impingement. L2-3: Mild annular disc bulge. Small posterior annular fissure. Mild facet hypertrophy. No significant canal or foraminal stenosis. L3-4: Mild annular disc bulge with disc desiccation. Moderate to advanced facet and ligament flavum hypertrophy. Resultant moderate spinal stenosis. Mild left greater than right L3 foraminal narrowing. L4-5: Negative interspace. Prior posterior fusion. No canal or foraminal stenosis. L5-S1: Anterolisthesis. Associated broad posterior pseudo disc bulge/uncovering. Prior posterior fusion. No canal or foraminal stenosis. No impingement. IMPRESSION: 1. No MRI evidence for acute abnormality within the thoracic or lumbar  spine. No evidence for infection or malignancy. 2. Small right paracentral disc protrusion at T12-L1 without stenosis or impingement. No other significant disc pathology or stenosis within the thoracic spine. 3. Mild disc bulging with advanced facet hypertrophy at L3-4 with resultant moderate spinal stenosis, with mild bilateral L3 foraminal narrowing. 4. Prior posterior fusion at L4-5 and L5-S1 without residual or recurrent stenosis. Electronically Signed   By: Jeannine Boga M.D.   On: 01/18/2019 05:11   Mr Lumbar Spine W Wo Contrast  Result Date: 01/18/2019 CLINICAL DATA:  Initial evaluation for acute fever, back pain, altered mental status. Concern for infection or malignancy. EXAM: MRI THORACIC AND  LUMBAR SPINE WITHOUT AND WITH CONTRAST TECHNIQUE: Multiplanar and multiecho pulse sequences of the thoracic and lumbar spine, were obtained without and with intravenous contrast. CONTRAST:  7 cc of Gadavist. COMPARISON:  None available. FINDINGS: MRI THORACIC SPINE FINDINGS Alignment: Mild dextroscoliosis. Alignment otherwise normal with preservation of the normal thoracic kyphosis. No listhesis or subluxation. Vertebrae: Vertebral body height maintained without evidence for acute or chronic fracture. Bone marrow signal intensity within normal limits. Few scattered benign hemangiomas noted, most prominent of which position within the T7 vertebral body and measures 1 cm. No other discrete or worrisome osseous lesions. No evidence for malignancy. Reactive marrow edema and enhancement about the anterior aspect of the T8-9 interspace felt to be degenerative in nature. No evidence for osteomyelitis discitis or septic arthritis. No other acute infection within the thoracic spine. Cord: Signal intensity within the thoracic spinal cord is normal. No epidural abscess or other collection. Prominent dorsal epidural fat noted within the midthoracic spine. Paraspinal tissues: Paraspinous soft tissues demonstrate no acute  finding. Trace layering right pleural effusion with associated right basilar atelectatic changes. Visualized lungs otherwise grossly clear. Visualized visceral structures within normal limits. Disc levels: T12-L1: Small right paracentral disc protrusion mildly indents the right ventral thecal sac (series 23, image 38). No significant stenosis or impingement. No other significant disc pathology seen within the thoracic spine. No significant facet degeneration. No canal or neural foraminal stenosis. No impingement. MRI LUMBAR SPINE FINDINGS Segmentation: Standard. Lowest well-formed disc labeled the L5-S1 level. Alignment: 4 mm anterolisthesis of L5 on S1. Trace retrolisthesis of L2 on L3 and L3 on L4. Exaggeration of the normal lumbar lordosis. Vertebrae: Vertebral body height maintained without evidence for acute or chronic fracture. Bone marrow signal intensity within normal limits. No discrete or worrisome osseous lesions. No evidence for malignancy. No abnormal marrow edema or enhancement to suggest osteomyelitis discitis or septic arthritis. Conus medullaris and cauda equina: Conus extends to the L1 level. Conus and cauda equina appear normal. No epidural abscess or other collections. Paraspinal and other soft tissues: Paraspinous soft tissues demonstrate no acute finding. Moderate distension of the partially visualized urinary bladder. Mild stranding about the left greater than right kidneys, better seen on prior CT. Visualized visceral structures otherwise unremarkable. Disc levels: L1-2:  Mild annular disc bulge.  No stenosis or impingement. L2-3: Mild annular disc bulge. Small posterior annular fissure. Mild facet hypertrophy. No significant canal or foraminal stenosis. L3-4: Mild annular disc bulge with disc desiccation. Moderate to advanced facet and ligament flavum hypertrophy. Resultant moderate spinal stenosis. Mild left greater than right L3 foraminal narrowing. L4-5: Negative interspace. Prior  posterior fusion. No canal or foraminal stenosis. L5-S1: Anterolisthesis. Associated broad posterior pseudo disc bulge/uncovering. Prior posterior fusion. No canal or foraminal stenosis. No impingement. IMPRESSION: 1. No MRI evidence for acute abnormality within the thoracic or lumbar spine. No evidence for infection or malignancy. 2. Small right paracentral disc protrusion at T12-L1 without stenosis or impingement. No other significant disc pathology or stenosis within the thoracic spine. 3. Mild disc bulging with advanced facet hypertrophy at L3-4 with resultant moderate spinal stenosis, with mild bilateral L3 foraminal narrowing. 4. Prior posterior fusion at L4-5 and L5-S1 without residual or recurrent stenosis. Electronically Signed   By: Jeannine Boga M.D.   On: 01/18/2019 05:11        Scheduled Meds:  levothyroxine  50 mcg Oral QAC breakfast   pantoprazole  40 mg Oral Daily   simvastatin  40 mg Oral QHS  warfarin  3 mg Oral ONCE-1800   Warfarin - Pharmacist Dosing Inpatient   Does not apply q1800   Continuous Infusions:   ceFAZolin (ANCEF) IV 2 g (01/19/19 0653)     LOS: 1 day    Time spent: 35 mins.More than 50% of that time was spent in counseling and/or coordination of care.      Shelly Coss, MD [facility] Pager [phone]  If 7PM-7AM, please contact night-coverage [email or web address] Password [email or web address] 01/19/2019, 1:39 PM

## 2019-01-19 NOTE — Progress Notes (Signed)
    [facility] has been requested to perform a transesophageal echocardiogram on [name] for Bacteremia.  After careful review of history and examination, the risks and benefits of transesophageal echocardiogram have been explained including risks of esophageal damage, perforation (1:10,000 risk), bleeding, pharyngeal hematoma as well as other potential complications associated with conscious sedation including aspiration, arrhythmia, respiratory failure and death. Alternatives to treatment were discussed, questions were answered. Patient is willing to proceed.  TEE - Dr. Gavin Potters today @ 1000. Patient is NPO.   Elizabeth Kail, PA-C 01/19/2019 7:30 AM

## 2019-01-19 NOTE — Progress Notes (Signed)
Patient ID: Elizabeth Morton, female   DOB: 03-22-1956, 63 y.o.   MRN: [id number]         [facility] for Infectious Disease  Date of Admission:  01/17/2019    Total days of antibiotics 3        Day 2 cefazolin        ASSESSMENT: She is responding well to initial antibiotic therapy for MSSA bacteremia.  Repeat blood cultures are negative at 24 hours.  No evidence of endocarditis was found on TEE.  If blood cultures remain negative tomorrow should be safe to have a PICC line placed.  I would recommend treatment with IV cefazolin for 4 weeks.  PLAN: 1. Continue cefazolin 2. Await results of repeat blood cultures  Diagnosis: Bacteremia  Culture Result: MSSA  No Known Allergies  OPAT Orders Discharge antibiotics: Per pharmacy protocol cefazolin  Duration: 4 weeks End Date: 02/14/2019  [facility] Care Per Protocol:  Labs weekly while on IV antibiotics: _x_ CBC with differential _x_ BMP __ CMP __ CRP __ ESR __ Vancomycin trough __ CK  __ Please pull PIC at completion of IV antibiotics __ Please leave PIC in place until doctor has seen patient or been notified  Fax weekly labs to [phone]  Clinic Follow Up Appt: 02/09/2019   Principal Problem:   Bacteremia due to methicillin susceptible Staphylococcus aureus (MSSA) Active Problems:   Sepsis ([place])   S/P aortic valve replacement   Aortic stenosis   Hx of CABG   Dyslipidemia   CAD (coronary artery disease)   Hypothyroidism   Chronic hepatitis C without hepatic coma ([place])   Acute kidney injury ([place])   Thrombocytopenia ([place])   Scheduled Meds: . levothyroxine  50 mcg Oral QAC breakfast  . pantoprazole  40 mg Oral Daily  . potassium chloride  40 mEq Oral Once  . simvastatin  40 mg Oral QHS  . Warfarin - Pharmacist Dosing Inpatient   Does not apply q1800   Continuous Infusions: .  ceFAZolin (ANCEF) IV 2 g (01/19/19 0653)   PRN Meds:.acetaminophen **OR** acetaminophen, lip balm, ondansetron **OR**  ondansetron (ZOFRAN) IV   SUBJECTIVE: She is just back from her TEE.  She says that she is feeling better.  She does not recall our conversation yesterday about her bacteremia.  Review of Systems: Review of Systems  Constitutional: Positive for malaise/fatigue. Negative for chills, diaphoresis and fever.  Respiratory: Negative for cough and shortness of breath.   Cardiovascular: Negative for chest pain.  Gastrointestinal: Negative for abdominal pain, diarrhea, nausea and vomiting.  Genitourinary: Negative for dysuria.  Musculoskeletal: Negative for back pain.    No Known Allergies  OBJECTIVE: Vitals:   01/19/19 0926 01/19/19 1021 01/19/19 1031 01/19/19 1041  BP: (!) 147/97 (!) 108/51 (!) 110/49 (!) 122/49  Pulse: (!) 106 82 97 (!) 106  Resp: (!) 24 (!) 25 (!) 26 (!) 28  Temp: 98.7 F (37.1 C)     TempSrc: Temporal Temporal    SpO2: 96% 94% 99% 96%  Weight:      Height:       Body mass index is 31.05 kg/m.  Physical Exam Constitutional:      Comments: She is resting quietly in bed.  Eyes:     Conjunctiva/sclera: Conjunctivae normal.  Cardiovascular:     Rate and Rhythm: Normal rate and regular rhythm.     Heart sounds: Murmur present.     Comments: No change in 6 holosystolic murmur and crisp, mechanical valve sounds.  Abdominal:     Palpations: Abdomen is soft.     Tenderness: There is no abdominal tenderness.  Musculoskeletal:        General: No swelling or tenderness.  Skin:    Findings: No rash.  Psychiatric:        Mood and Affect: Mood normal.     Lab Results Lab Results  Component Value Date   WBC 7.0 01/18/2019   HGB 14.6 01/18/2019   HCT 42.7 01/18/2019   MCV 89.3 01/18/2019   PLT 111 (L) 01/18/2019    Lab Results  Component Value Date   CREATININE 1.17 (H) 01/19/2019   BUN 15 01/19/2019   NA 140 01/19/2019   K 3.3 (L) 01/19/2019   CL 113 (H) 01/19/2019   CO2 18 (L) 01/19/2019    Lab Results  Component Value Date   ALT 68 (H)  01/18/2019   AST 88 (H) 01/18/2019   ALKPHOS 80 01/18/2019   BILITOT 0.9 01/18/2019     Microbiology: Recent Results (from the past 240 hour(s))  Culture, blood (Routine x 2)     Status: Abnormal (Preliminary result)   Collection Time: 01/17/19  9:00 PM   Specimen: BLOOD  Result Value Ref Range Status   Specimen Description BLOOD RIGHT HAND  Final   Special Requests   Final    BOTTLES DRAWN AEROBIC ONLY Blood Culture adequate volume   Culture  Setup Time   Final    GRAM POSITIVE COCCI IN CLUSTERS AEROBIC BOTTLE ONLY CRITICAL VALUE NOTED.  VALUE IS CONSISTENT WITH PREVIOUSLY REPORTED AND CALLED VALUE.    Culture (A)  Final    STAPHYLOCOCCUS AUREUS SUSCEPTIBILITIES TO FOLLOW Performed at [facility], [place] [address] [place], [place] [address]    Report Status PENDING  Incomplete  Culture, blood (Routine x 2)     Status: Abnormal (Preliminary result)   Collection Time: 01/17/19  9:08 PM   Specimen: BLOOD  Result Value Ref Range Status   Specimen Description BLOOD RIGHT ARM  Final   Special Requests   Final    BOTTLES DRAWN AEROBIC AND ANAEROBIC Blood Culture adequate volume   Culture  Setup Time   Final    GRAM POSITIVE COCCI IN CLUSTERS IN BOTH AEROBIC AND ANAEROBIC BOTTLES CRITICAL RESULT CALLED TO, READ BACK BY AND VERIFIED WITH: Cristopher Estimable PharmD 10:15 01/18/19 ([email or web address])    Culture (A)  Final    STAPHYLOCOCCUS AUREUS SUSCEPTIBILITIES TO FOLLOW Performed at [facility], [place] [address], [place], [place] [address]    Report Status PENDING  Incomplete  Blood Culture ID Panel (Reflexed)     Status: Abnormal   Collection Time: 01/17/19  9:08 PM  Result Value Ref Range Status   Enterococcus species NOT DETECTED NOT DETECTED Final   Listeria monocytogenes NOT DETECTED NOT DETECTED Final   Staphylococcus species DETECTED (A) NOT DETECTED Final    Comment: CRITICAL RESULT CALLED TO, READ BACK BY AND VERIFIED WITH: Cristopher Estimable PharmD 10:15 01/18/19 ([email or web address])     Staphylococcus aureus (BCID) DETECTED (A) NOT DETECTED Final    Comment: Methicillin (oxacillin) susceptible Staphylococcus aureus (MSSA). Preferred therapy is anti staphylococcal beta lactam antibiotic (Cefazolin or Nafcillin), unless clinically contraindicated. CRITICAL RESULT CALLED TO, READ BACK BY AND VERIFIED WITH: Cristopher Estimable PharmD 10:15 01/18/19 ([email or web address])    Methicillin resistance NOT DETECTED NOT DETECTED Final   Streptococcus species NOT DETECTED NOT DETECTED Final   Streptococcus agalactiae NOT DETECTED NOT DETECTED Final   Streptococcus pneumoniae  NOT DETECTED NOT DETECTED Final   Streptococcus pyogenes NOT DETECTED NOT DETECTED Final   Acinetobacter baumannii NOT DETECTED NOT DETECTED Final   Enterobacteriaceae species NOT DETECTED NOT DETECTED Final   Enterobacter cloacae complex NOT DETECTED NOT DETECTED Final   Escherichia coli NOT DETECTED NOT DETECTED Final   Klebsiella oxytoca NOT DETECTED NOT DETECTED Final   Klebsiella pneumoniae NOT DETECTED NOT DETECTED Final   Proteus species NOT DETECTED NOT DETECTED Final   Serratia marcescens NOT DETECTED NOT DETECTED Final   Haemophilus influenzae NOT DETECTED NOT DETECTED Final   Neisseria meningitidis NOT DETECTED NOT DETECTED Final   Pseudomonas aeruginosa NOT DETECTED NOT DETECTED Final   Candida albicans NOT DETECTED NOT DETECTED Final   Candida glabrata NOT DETECTED NOT DETECTED Final   Candida krusei NOT DETECTED NOT DETECTED Final   Candida parapsilosis NOT DETECTED NOT DETECTED Final   Candida tropicalis NOT DETECTED NOT DETECTED Final    Comment: Performed at [facility], [place] [address] [place], [place] [address]  SARS Coronavirus 2 (CEPHEID- Performed in [place] hospital lab), Hosp Order     Status: None   Collection Time: 01/18/19  2:02 AM   Specimen: Nasopharyngeal Swab  Result Value Ref Range Status   SARS Coronavirus 2 NEGATIVE NEGATIVE Final    Comment: (NOTE) If result is NEGATIVE  SARS-CoV-2 target nucleic acids are NOT DETECTED. The SARS-CoV-2 RNA is generally detectable in upper and lower  respiratory specimens during the acute phase of infection. The lowest  concentration of SARS-CoV-2 viral copies this assay can detect is 250  copies / mL. A negative result does not preclude SARS-CoV-2 infection  and should not be used as the sole basis for treatment or other  patient management decisions.  A negative result may occur with  improper specimen collection / handling, submission of specimen other  than nasopharyngeal swab, presence of viral mutation(s) within the  areas targeted by this assay, and inadequate number of viral copies  (<250 copies / mL). A negative result must be combined with clinical  observations, patient history, and epidemiological information. If result is POSITIVE SARS-CoV-2 target nucleic acids are DETECTED. The SARS-CoV-2 RNA is generally detectable in upper and lower  respiratory specimens dur ing the acute phase of infection.  Positive  results are indicative of active infection with SARS-CoV-2.  Clinical  correlation with patient history and other diagnostic information is  necessary to determine patient infection status.  Positive results do  not rule out bacterial infection or co-infection with other viruses. If result is PRESUMPTIVE POSTIVE SARS-CoV-2 nucleic acids MAY BE PRESENT.   A presumptive positive result was obtained on the submitted specimen  and confirmed on repeat testing.  While 2019 novel coronavirus  (SARS-CoV-2) nucleic acids may be present in the submitted sample  additional confirmatory testing may be necessary for epidemiological  and / or clinical management purposes  to differentiate between  SARS-CoV-2 and other Sarbecovirus currently known to infect humans.  If clinically indicated additional testing with an alternate test  methodology [phone]) is advised. The SARS-CoV-2 RNA is generally  detectable in upper  and lower respiratory sp ecimens during the acute  phase of infection. The expected result is Negative. Fact Sheet for Patients:  StrictlyIdeas.no Fact Sheet for Healthcare Providers: BankingDealers.co.za This test is not yet approved or cleared by the [place] FDA and has been authorized for detection and/or diagnosis of SARS-CoV-2 by FDA under an Emergency Use Authorization (EUA).  This EUA will  remain in effect (meaning this test can be used) for the duration of the COVID-19 declaration under Section 564(b)(1) of the Act, 21 U.S.C. section 360bbb-3(b)(1), unless the authorization is terminated or revoked sooner. Performed at [facility], [place] [address], [place], [place] [address]     Michel Bickers, [facility] [place] [phone] pager   [phone] cell 01/19/2019, 11:43 AM

## 2019-01-19 NOTE — Anesthesia Postprocedure Evaluation (Signed)
Anesthesia Post Note  Patient: Elizabeth Morton  Procedure(s) Performed: TRANSESOPHAGEAL ECHOCARDIOGRAM (TEE) (N/A )     Patient location during evaluation: Endoscopy Anesthesia Type: MAC Level of consciousness: awake and alert Pain management: pain level controlled Vital Signs Assessment: post-procedure vital signs reviewed and stable Respiratory status: spontaneous breathing, nonlabored ventilation and respiratory function stable Cardiovascular status: stable and blood pressure returned to baseline Postop Assessment: no apparent nausea or vomiting Anesthetic complications: no    Last Vitals:  Vitals:   01/19/19 1031 01/19/19 1041  BP: (!) 110/49 (!) 122/49  Pulse: 97 (!) 106  Resp: (!) 26 (!) 28  Temp:    SpO2: 99% 96%    Last Pain:  Vitals:   01/19/19 1041  TempSrc:   PainSc: 0-No pain                 Orin Eberwein,W. EDMOND

## 2019-01-20 LAB — BASIC METABOLIC PANEL
Anion gap: 7 (ref 5–15)
BUN: 12 mg/dL (ref 8–23)
CO2: 21 mmol/L — ABNORMAL LOW (ref 22–32)
Calcium: 8.1 mg/dL — ABNORMAL LOW (ref 8.9–10.3)
Chloride: 111 mmol/L (ref 98–111)
Creatinine, Ser: 0.99 mg/dL (ref 0.44–1.00)
GFR calc Af Amer: >60 mL/min (ref >60)
GFR calc non Af Amer: >60 mL/min (ref >60)
Glucose, Bld: 116 mg/dL — ABNORMAL HIGH (ref 70–99)
Potassium: 3.3 mmol/L — ABNORMAL LOW (ref 3.5–5.1)
Sodium: 139 mmol/L (ref 135–145)

## 2019-01-20 LAB — CULTURE, BLOOD (ROUTINE X 2)
Special Requests: ADEQUATE
Special Requests: ADEQUATE

## 2019-01-20 LAB — PROTIME-INR
INR: 3.8 — ABNORMAL HIGH (ref 0.8–1.2)
Prothrombin Time: 36.8 seconds — ABNORMAL HIGH (ref 11.4–15.2)

## 2019-01-20 LAB — MRSA PCR SCREENING: MRSA by PCR: NEGATIVE

## 2019-01-20 MED ORDER — CEFAZOLIN IV (FOR PTA / DISCHARGE USE ONLY): 2.0000 g | Freq: Three times a day (TID) | INTRAVENOUS | 0 refills | Status: DC

## 2019-01-20 MED ORDER — POTASSIUM CHLORIDE CRYS ER 20 MEQ PO TBCR
40.0000 meq | EXTENDED_RELEASE_TABLET | Freq: Three times a day (TID) | ORAL | Status: AC
  Administered 2019-01-20 (×2): 40 meq via ORAL
  Filled 2019-01-20 (×2): qty 2

## 2019-01-20 NOTE — Progress Notes (Signed)
PROGRESS NOTE    Elizabeth Morton  GBT:[id number] DOB: 08-08-1955 DOA: 01/17/2019 PCP: Levin Erp, MD   Brief Narrative:  Patient is a 44 female with history of coronary artery disease status post CABG, mechanical aortic valve, hypothyroidism, CVA, hypertension who was brought to the emergencydepartment after she was found to be confused at home with fevers and chills. In the emergency department she was found to have fever of 103 Fahrenheit. COVID-19 screening test negative. She was also found to have elevated troponin,acute kidney injury. Cardiology consulted. Blood cultures this morning reveal MSSA. ID consulted. Started on cefazolin. She went for TEE today which did not show any vegetation.  If her blood cultures remain negative tomorrow, she can undergo PICC line placement and be discharged home with home health for completion of antibiotic therapy.  Consultants:   ID  Cardiology  Procedures:   TEE on 01/19/2019  Antimicrobials:   IV cefazolin every 8 hours   Subjective: Patient seen and examined.  She has no complaints.  Denies any fever, chills or sweating.  Objective: Vitals:   01/20/19 0244 01/20/19 0500 01/20/19 0844 01/20/19 1236  BP:      Pulse: 80     Resp: (!) 28     Temp:   98 F (36.7 C) 98 F (36.7 C)  TempSrc:   Oral Oral  SpO2: 93%     Weight:  72.6 kg    Height:        Intake/Output Summary (Last 24 hours) at 01/20/2019 1445 Last data filed at 01/20/2019 0934 Gross per 24 hour  Intake 754.83 ml  Output 650 ml  Net 104.83 ml   Filed Weights   01/18/19 0517 01/19/19 0415 01/20/19 0500  Weight: 72.4 kg 79.5 kg 72.6 kg    Examination:  General exam: Appears calm and comfortable  Respiratory system: Clear to auscultation. Respiratory effort normal. Cardiovascular system: S1 & S2 heard, RRR. No JVD, murmurs, rubs, gallops or clicks. No pedal edema. Gastrointestinal system: Abdomen is nondistended, soft and nontender. No organomegaly or  masses felt. Normal bowel sounds heard. Central nervous system: Alert and oriented. No focal neurological deficits. Extremities: Symmetric 5 x 5 power. Skin: No rashes, lesions or ulcers Psychiatry: Judgement and insight appear normal. Mood & affect appropriate.    Data Reviewed: I have personally reviewed following labs and imaging studies  CBC: Recent Labs  Lab 01/17/19 2107 01/18/19 0543  WBC 9.0 7.0  NEUTROABS 8.2* 6.2  HGB 15.9* 14.6  HCT 46.3* 42.7  MCV 89.0 89.3  PLT 148* 710*   Basic Metabolic Panel: Recent Labs  Lab 01/17/19 2107 01/18/19 0543 01/18/19 1115 01/19/19 0404 01/20/19 0241  NA 134* 136 138 140 139  K 3.4* 3.5 3.2* 3.3* 3.3*  CL 103 105 110 113* 111  CO2 17* 19* 18* 18* 21*  GLUCOSE 186* 128* 130* 111* 116*  BUN 17 21 19 15 12   CREATININE 1.67* 1.75* 1.42* 1.17* 0.99  CALCIUM 9.3 8.5* 8.1* 8.0* 8.1*   GFR: Estimated Creatinine Clearance: 57 mL/min (by C-G formula based on SCr of 0.99 mg/dL). Liver Function Tests: Recent Labs  Lab 01/17/19 2107 01/18/19 0543  AST 133* 88*  ALT 96* 68*  ALKPHOS 102 80  BILITOT 1.5* 0.9  PROT 7.6 6.3*  ALBUMIN 3.9 3.1*   No results for input(s): LIPASE, AMYLASE in the last 168 hours. No results for input(s): AMMONIA in the last 168 hours. Coagulation Profile: Recent Labs  Lab 01/17/19 2107 01/18/19 0543 01/19/19 0404  01/20/19 0241  INR 2.0* 2.0* 2.9* 3.8*   Cardiac Enzymes: No results for input(s): CKTOTAL, CKMB, CKMBINDEX, TROPONINI in the last 168 hours. BNP (last 3 results) No results for input(s): PROBNP in the last 8760 hours. HbA1C: Recent Labs    01/18/19 0540  HGBA1C 5.4   CBG: No results for input(s): GLUCAP in the last 168 hours. Lipid Profile: No results for input(s): CHOL, HDL, LDLCALC, TRIG, CHOLHDL, LDLDIRECT in the last 72 hours. Thyroid Function Tests: No results for input(s): TSH, T4TOTAL, FREET4, T3FREE, THYROIDAB in the last 72 hours. Anemia Panel: No results for  input(s): VITAMINB12, FOLATE, FERRITIN, TIBC, IRON, RETICCTPCT in the last 72 hours. Sepsis Labs: Recent Labs  Lab 01/18/19 0004 01/18/19 0543 01/18/19 0842 01/18/19 1115  PROCALCITON  --  108.03  --   --   LATICACIDVEN 3.3* 2.9* 2.4* 1.5    Recent Results (from the past 240 hour(s))  Culture, blood (Routine x 2)     Status: Abnormal   Collection Time: 01/17/19  9:00 PM   Specimen: BLOOD  Result Value Ref Range Status   Specimen Description BLOOD RIGHT HAND  Final   Special Requests   Final    BOTTLES DRAWN AEROBIC ONLY Blood Culture adequate volume   Culture  Setup Time   Final    GRAM POSITIVE COCCI IN CLUSTERS AEROBIC BOTTLE ONLY CRITICAL VALUE NOTED.  VALUE IS CONSISTENT WITH PREVIOUSLY REPORTED AND CALLED VALUE.    Culture (A)  Final    STAPHYLOCOCCUS AUREUS SUSCEPTIBILITIES PERFORMED ON PREVIOUS CULTURE WITHIN THE LAST 5 DAYS. Performed at [facility], [place] [address], [place], [place] [address]    Report Status 01/20/2019 FINAL  Final  Culture, blood (Routine x 2)     Status: Abnormal   Collection Time: 01/17/19  9:08 PM   Specimen: BLOOD  Result Value Ref Range Status   Specimen Description BLOOD RIGHT ARM  Final   Special Requests   Final    BOTTLES DRAWN AEROBIC AND ANAEROBIC Blood Culture adequate volume   Culture  Setup Time   Final    GRAM POSITIVE COCCI IN CLUSTERS IN BOTH AEROBIC AND ANAEROBIC BOTTLES CRITICAL RESULT CALLED TO, READ BACK BY AND VERIFIED WITH: Cristopher Estimable PharmD 10:15 01/18/19 ([email or web address]) Performed at [facility], [place] [address], [place], [place] [address]    Culture STAPHYLOCOCCUS AUREUS (A)  Final   Report Status 01/20/2019 FINAL  Final   Organism ID, Bacteria STAPHYLOCOCCUS AUREUS  Final      Susceptibility   Staphylococcus aureus - MIC*    CIPROFLOXACIN <=0.5 SENSITIVE Sensitive     ERYTHROMYCIN <=0.25 SENSITIVE Sensitive     GENTAMICIN <=0.5 SENSITIVE Sensitive     OXACILLIN <=0.25 SENSITIVE Sensitive     TETRACYCLINE  <=1 SENSITIVE Sensitive     VANCOMYCIN <=0.5 SENSITIVE Sensitive     TRIMETH/SULFA <=10 SENSITIVE Sensitive     CLINDAMYCIN <=0.25 SENSITIVE Sensitive     RIFAMPIN <=0.5 SENSITIVE Sensitive     Inducible Clindamycin NEGATIVE Sensitive     * STAPHYLOCOCCUS AUREUS  Blood Culture ID Panel (Reflexed)     Status: Abnormal   Collection Time: 01/17/19  9:08 PM  Result Value Ref Range Status   Enterococcus species NOT DETECTED NOT DETECTED Final   Listeria monocytogenes NOT DETECTED NOT DETECTED Final   Staphylococcus species DETECTED (A) NOT DETECTED Final    Comment: CRITICAL RESULT CALLED TO, READ BACK BY AND VERIFIED WITH: Cristopher Estimable PharmD 10:15 01/18/19 ([email or web address])  Staphylococcus aureus (BCID) DETECTED (A) NOT DETECTED Final    Comment: Methicillin (oxacillin) susceptible Staphylococcus aureus (MSSA). Preferred therapy is anti staphylococcal beta lactam antibiotic (Cefazolin or Nafcillin), unless clinically contraindicated. CRITICAL RESULT CALLED TO, READ BACK BY AND VERIFIED WITH: Cristopher Estimable PharmD 10:15 01/18/19 (wilsonm)    Methicillin resistance NOT DETECTED NOT DETECTED Final   Streptococcus species NOT DETECTED NOT DETECTED Final   Streptococcus agalactiae NOT DETECTED NOT DETECTED Final   Streptococcus pneumoniae NOT DETECTED NOT DETECTED Final   Streptococcus pyogenes NOT DETECTED NOT DETECTED Final   Acinetobacter baumannii NOT DETECTED NOT DETECTED Final   Enterobacteriaceae species NOT DETECTED NOT DETECTED Final   Enterobacter cloacae complex NOT DETECTED NOT DETECTED Final   Escherichia coli NOT DETECTED NOT DETECTED Final   Klebsiella oxytoca NOT DETECTED NOT DETECTED Final   Klebsiella pneumoniae NOT DETECTED NOT DETECTED Final   Proteus species NOT DETECTED NOT DETECTED Final   Serratia marcescens NOT DETECTED NOT DETECTED Final   Haemophilus influenzae NOT DETECTED NOT DETECTED Final   Neisseria meningitidis NOT DETECTED NOT DETECTED Final   Pseudomonas  aeruginosa NOT DETECTED NOT DETECTED Final   Candida albicans NOT DETECTED NOT DETECTED Final   Candida glabrata NOT DETECTED NOT DETECTED Final   Candida krusei NOT DETECTED NOT DETECTED Final   Candida parapsilosis NOT DETECTED NOT DETECTED Final   Candida tropicalis NOT DETECTED NOT DETECTED Final    Comment: Performed at [facility], [address], [place], [place] [address]  SARS Coronavirus 2 (CEPHEID- Performed in [facility] hospital lab), Hosp Order     Status: None   Collection Time: 01/18/19  2:02 AM   Specimen: Nasopharyngeal Swab  Result Value Ref Range Status   SARS Coronavirus 2 NEGATIVE NEGATIVE Final    Comment: (NOTE) If result is NEGATIVE SARS-CoV-2 target nucleic acids are NOT DETECTED. The SARS-CoV-2 RNA is generally detectable in upper and lower  respiratory specimens during the acute phase of infection. The lowest  concentration of SARS-CoV-2 viral copies this assay can detect is 250  copies / mL. A negative result does not preclude SARS-CoV-2 infection  and should not be used as the sole basis for treatment or other  patient management decisions.  A negative result may occur with  improper specimen collection / handling, submission of specimen other  than nasopharyngeal swab, presence of viral mutation(s) within the  areas targeted by this assay, and inadequate number of viral copies  (<250 copies / mL). A negative result must be combined with clinical  observations, patient history, and epidemiological information. If result is POSITIVE SARS-CoV-2 target nucleic acids are DETECTED. The SARS-CoV-2 RNA is generally detectable in upper and lower  respiratory specimens dur ing the acute phase of infection.  Positive  results are indicative of active infection with SARS-CoV-2.  Clinical  correlation with patient history and other diagnostic information is  necessary to determine patient infection status.  Positive results do  not rule out bacterial  infection or co-infection with other viruses. If result is PRESUMPTIVE POSTIVE SARS-CoV-2 nucleic acids MAY BE PRESENT.   A presumptive positive result was obtained on the submitted specimen  and confirmed on repeat testing.  While 2019 novel coronavirus  (SARS-CoV-2) nucleic acids may be present in the submitted sample  additional confirmatory testing may be necessary for epidemiological  and / or clinical management purposes  to differentiate between  SARS-CoV-2 and other Sarbecovirus currently known to infect humans.  If clinically indicated additional testing with an  alternate test  methodology [phone]) is advised. The SARS-CoV-2 RNA is generally  detectable in upper and lower respiratory sp ecimens during the acute  phase of infection. The expected result is Negative. Fact Sheet for Patients:  StrictlyIdeas.no Fact Sheet for Healthcare Providers: BankingDealers.co.za This test is not yet approved or cleared by the [place] FDA and has been authorized for detection and/or diagnosis of SARS-CoV-2 by FDA under an Emergency Use Authorization (EUA).  This EUA will remain in effect (meaning this test can be used) for the duration of the COVID-19 declaration under Section 564(b)(1) of the Act, 21 U.S.C. section 360bbb-3(b)(1), unless the authorization is terminated or revoked sooner. Performed at [facility], [place] [address], [place], [place] [address]   Culture, blood (routine x 2)     Status: None (Preliminary result)   Collection Time: 01/19/19  6:15 AM   Specimen: BLOOD  Result Value Ref Range Status   Specimen Description BLOOD RIGHT ANTECUBITAL  Final   Special Requests   Final    BOTTLES DRAWN AEROBIC AND ANAEROBIC Blood Culture adequate volume   Culture   Final    NO GROWTH 1 DAY Performed at [facility], [place] [address] [place], [place] [address]    Report Status PENDING  Incomplete  Culture, blood  (routine x 2)     Status: None (Preliminary result)   Collection Time: 01/19/19  6:24 AM   Specimen: BLOOD RIGHT HAND  Result Value Ref Range Status   Specimen Description BLOOD RIGHT HAND  Final   Special Requests   Final    BOTTLES DRAWN AEROBIC AND ANAEROBIC Blood Culture adequate volume   Culture   Final    NO GROWTH 1 DAY Performed at [facility], [place] [address], [place], [place] [address]    Report Status PENDING  Incomplete  MRSA PCR Screening     Status: None   Collection Time: 01/20/19  7:07 AM   Specimen: Nasal Mucosa; Nasopharyngeal  Result Value Ref Range Status   MRSA by PCR NEGATIVE NEGATIVE Final    Comment:        The GeneXpert MRSA Assay (FDA approved for NASAL specimens only), is one component of a comprehensive MRSA colonization surveillance program. It is not intended to diagnose MRSA infection nor to guide or monitor treatment for MRSA infections. Performed at [facility], [place] [address], [place], [place] [address]       Radiology Studies: No results found.  Scheduled Meds: . levothyroxine  50 mcg Oral QAC breakfast  . pantoprazole  40 mg Oral Daily  . potassium chloride  40 mEq Oral TID  . simvastatin  40 mg Oral QHS  . Warfarin - Pharmacist Dosing Inpatient   Does not apply q1800   Continuous Infusions: . sodium chloride 10 mL/hr at 01/20/19 0700  .  ceFAZolin (ANCEF) IV 2 g (01/20/19 0511)     LOS: 2 days   Assessment & Plan:   Principal Problem:   Bacteremia due to methicillin susceptible Staphylococcus aureus (MSSA) Active Problems:   Hx of CABG   Dyslipidemia   CAD (coronary artery disease)   S/P aortic valve replacement   Aortic stenosis   Hypothyroidism   Chronic hepatitis C without hepatic coma (HCC)   Sepsis (HCC)   Acute kidney injury ([place])   Thrombocytopenia (HCC)   MSSA bacteremia: Unclear source.  TEE negative for any endocarditis.  Repeat blood culture from yesterday negative so far.  Continue  cefazolin per ID recommendations.  Appreciate ID help.  History of mechanical aortic valve replacement in 2006: Continue warfarin dosed by pharmacy.  [facility] as an outpatient.  Hypothyroidism: Synthroid  AKI: Resolved.  Hypokalemia: We will replace today.  Recheck in the morning.  Elevated troponin in a patient with history of CAD status post CABG x2: Likely secondary to sepsis.  No signs or symptoms of ACS.  Cardiology on board.  DVT prophylaxis: Coumadin Code Status: Full code Family Communication: None present at bedside. Disposition Plan: Most likely discharge home tomorrow.  IV antibiotics arranged.   Time spent: 29 minutes   Darliss Cheney, MD [facility] Pager [phone]  If 7PM-7AM, please contact night-coverage [email or web address] Password [email or web address] 01/20/2019, 2:45 PM

## 2019-01-20 NOTE — Progress Notes (Signed)
Transesophageal echocardiogram performed yesterday by Dr. Stanford Breed to evaluate for potential vegetations in the setting of mechanical aortic prosthesis and bacteremia did not show vegetations.  We will sign off.  Lorretta Harp, M.D., [place], [facility], [place] [address] [place], [place]  [address]  [phone] 01/20/2019 10:38 AM

## 2019-01-20 NOTE — TOC Initial Note (Signed)
Transition of Care [facility] - Initial/Assessment Note    Patient Details  Name: Elizabeth Morton MRN: [id number] Date of Birth: 13-Jan-1956  Transition of [facility]) CM/SW Contact:    Benard Halsted, LCSW Phone Number: 01/20/2019, 12:13 PM  Clinical Narrative:                 CSW received consult regarding IV antibiotics at home. Pam with [facility] following for IV antibiotic needs and Linna Hoff is reviewing referral for a home health RN to follow.   Expected Discharge Plan: [place] Barriers to Discharge: No Barriers Identified   Patient Goals and CMS Choice Patient states their goals for this hospitalization and ongoing recovery are:: Return home CMS Medicare.gov Compare Post Acute Care list provided to:: Patient Choice offered to / list presented to : Patient  Expected Discharge Plan and Services Expected Discharge Plan: [place] In-house Referral: NA Discharge Planning Services: CM Consult Post Acute Care Choice: [place] arrangements for the past 2 months: Single Family Home                 DME Arranged: N/A         HH Arranged: RN [place] Agency: [place] ([place]) Date [place]: 01/20/19 Time [place]: 53 Representative spoke with at [place]: [place] Arrangements/Services Living arrangements for the past 2 months: [place] with:: Spouse Patient language and need for interpreter reviewed:: Yes Do you feel safe going back to the place where you live?: Yes      Need for Family Participation in Patient Care: No (Comment) Care giver support system in place?: Yes (comment)   Criminal Activity/Legal Involvement Pertinent to Current Situation/Hospitalization: No - Comment as needed  Activities of Daily Living Home Assistive Devices/Equipment: Dentures (specify type), Contact lenses ADL Screening (condition at time of admission) Patient's cognitive ability  adequate to safely complete daily activities?: Yes Is the patient deaf or have difficulty hearing?: No Does the patient have difficulty seeing, even when wearing glasses/contacts?: No Does the patient have difficulty concentrating, remembering, or making decisions?: No Patient able to express need for assistance with ADLs?: Yes Does the patient have difficulty dressing or bathing?: No Independently performs ADLs?: Yes (appropriate for developmental age) Does the patient have difficulty walking or climbing stairs?: No Weakness of Legs: None Weakness of Arms/Hands: None  Permission Sought/Granted                  Emotional Assessment Appearance:: Appears stated age Attitude/Demeanor/Rapport: Engaged Affect (typically observed): Accepting, Appropriate Orientation: : Oriented to Self, Oriented to Place, Oriented to  Time, Oriented to Situation Alcohol / Substance Use: Not Applicable Psych Involvement: No (comment)  Admission diagnosis:  Fever in adult [R50.9] Sepsis with encephalopathy and septic shock, due to unspecified organism ([place]) [A41.9, R65.21, G93.40] Sepsis ([place]) [A41.9] Patient Active Problem List   Diagnosis Date Noted  . Sepsis ([place]) 01/18/2019  . Bacteremia due to methicillin susceptible Staphylococcus aureus (MSSA) 01/18/2019  . Acute kidney injury ([place]) 01/18/2019  . Thrombocytopenia ([place]) 01/18/2019  . Chronic hepatitis C without hepatic coma ([place]) 05/04/2017  . S/P repair of ventral hernia Nov 2017 05/15/2016  . Ventral hernia 05/15/2016  . Chronic congestive heart failure with left ventricular diastolic dysfunction ([place]) 06/25/2015  . Hypothyroidism 06/25/2015  . Long term (current) use of anticoagulants 06/02/2012  . CAD (coronary artery disease)   . S/P aortic  valve replacement   . Aortic stenosis   . Chronic cholecystitis with calculus 05/25/2012  . Constipation, chronic 05/25/2012  . Needs screening Colonoscopy - Pt refused 05/25/2012  . Warfarin  anticoagulation   . Hypertension   . Hx of CABG   . History of CVA (cerebrovascular accident)   . Dyslipidemia   . Shortness of breath 04/16/2010   PCP:  Levin Erp, MD Pharmacy:   [id number] - , [place] [address] Phone: [phone] Fax: [phone]     Social Determinants of Health (SDOH) Interventions    Readmission Risk Interventions No flowsheet data found.

## 2019-01-20 NOTE — Progress Notes (Signed)
Patient ID: Elizabeth Morton, female   DOB: Mar 01, 1956, 63 y.o.   MRN: [id number]         [facility]  Date of Admission:  01/17/2019    Total days of antibiotics 4        Day 3 cefazolin        ASSESSMENT: She is responding well to initial antibiotic therapy for MSSA bacteremia.  Repeat blood cultures are negative at 24 hours.  No evidence of endocarditis was found on TEE.  If blood cultures remain negative tomorrow it should be safe to have a PICC line placed.  I would recommend treatment with IV cefazolin for 4 weeks.  PLAN: 1. Continue cefazolin 2. PICC placement once blood cultures negative at 48 hours 3. I will sign off now and arrange follow-up in my clinic  Diagnosis: Bacteremia  Culture Result: MSSA  No Known Allergies  OPAT Orders Discharge antibiotics: Per pharmacy protocol cefazolin  Duration: 4 weeks End Date: 02/14/2019  [facility] Per Protocol:  Labs weekly while on IV antibiotics: _x_ CBC with differential _x_ BMP __ CMP __ CRP __ ESR __ Vancomycin trough __ CK  __ Please pull PIC at completion of IV antibiotics __ Please leave PIC in place until doctor has seen patient or been notified  Fax weekly labs to [phone]  Clinic Follow Up Appt: 02/09/2019   Principal Problem:   Bacteremia due to methicillin susceptible Staphylococcus aureus (MSSA) Active Problems:   Sepsis ([place])   S/P aortic valve replacement   Aortic stenosis   Hx of CABG   Dyslipidemia   CAD (coronary artery disease)   Hypothyroidism   Chronic hepatitis C without hepatic coma ([place])   Acute kidney injury ([place])   Thrombocytopenia ([place])   Scheduled Meds: . levothyroxine  50 mcg Oral QAC breakfast  . pantoprazole  40 mg Oral Daily  . simvastatin  40 mg Oral QHS  . Warfarin - Pharmacist Dosing Inpatient   Does not apply q1800   Continuous Infusions: . sodium chloride 10 mL/hr at 01/20/19 0700  .  ceFAZolin (ANCEF) IV 2 g (01/20/19 0511)    PRN Meds:.sodium chloride, acetaminophen **OR** acetaminophen, lip balm, ondansetron **OR** ondansetron (ZOFRAN) IV   SUBJECTIVE: She is feeling a little bit stronger.  She did sit up and eat breakfast and walk to the bathroom.  Review of Systems: Review of Systems  Constitutional: Positive for malaise/fatigue. Negative for chills, diaphoresis and fever.  Respiratory: Negative for cough and shortness of breath.   Cardiovascular: Negative for chest pain.  Gastrointestinal: Negative for abdominal pain, diarrhea, nausea and vomiting.  Genitourinary: Negative for dysuria.  Musculoskeletal: Negative for back pain.    No Known Allergies  OBJECTIVE: Vitals:   01/19/19 2351 01/20/19 0244 01/20/19 0500 01/20/19 0844  BP: (!) 142/63     Pulse: 87 80    Resp: (!) 33 (!) 28    Temp:    98 F (36.7 C)  TempSrc:    Oral  SpO2: 100% 93%    Weight:   72.6 kg   Height:       Body mass index is 28.34 kg/m.  Physical Exam Constitutional:      Comments: She is resting quietly in bed.  Eyes:     Conjunctiva/sclera: Conjunctivae normal.  Cardiovascular:     Rate and Rhythm: Normal rate and regular rhythm.     Heart sounds: Murmur present.     Comments: No change in 6 holosystolic  murmur and crisp, mechanical valve sounds. Abdominal:     Palpations: Abdomen is soft.     Tenderness: There is no abdominal tenderness.  Musculoskeletal:        General: No swelling or tenderness.  Skin:    Findings: No rash.  Psychiatric:        Mood and Affect: Mood normal.     Lab Results Lab Results  Component Value Date   WBC 7.0 01/18/2019   HGB 14.6 01/18/2019   HCT 42.7 01/18/2019   MCV 89.3 01/18/2019   PLT 111 (L) 01/18/2019    Lab Results  Component Value Date   CREATININE 0.99 01/20/2019   BUN 12 01/20/2019   NA 139 01/20/2019   K 3.3 (L) 01/20/2019   CL 111 01/20/2019   CO2 21 (L) 01/20/2019    Lab Results  Component Value Date   ALT 68 (H) 01/18/2019   AST 88 (H)  01/18/2019   ALKPHOS 80 01/18/2019   BILITOT 0.9 01/18/2019     Microbiology: Recent Results (from the past 240 hour(s))  Culture, blood (Routine x 2)     Status: Abnormal   Collection Time: 01/17/19  9:00 PM   Specimen: BLOOD  Result Value Ref Range Status   Specimen Description BLOOD RIGHT HAND  Final   Special Requests   Final    BOTTLES DRAWN AEROBIC ONLY Blood Culture adequate volume   Culture  Setup Time   Final    GRAM POSITIVE COCCI IN CLUSTERS AEROBIC BOTTLE ONLY CRITICAL VALUE NOTED.  VALUE IS CONSISTENT WITH PREVIOUSLY REPORTED AND CALLED VALUE.    Culture (A)  Final    STAPHYLOCOCCUS AUREUS SUSCEPTIBILITIES PERFORMED ON PREVIOUS CULTURE WITHIN THE LAST 5 DAYS. Performed at [facility], [place] [address], [place], [place] [address]    Report Status 01/20/2019 FINAL  Final  Culture, blood (Routine x 2)     Status: Abnormal   Collection Time: 01/17/19  9:08 PM   Specimen: BLOOD  Result Value Ref Range Status   Specimen Description BLOOD RIGHT ARM  Final   Special Requests   Final    BOTTLES DRAWN AEROBIC AND ANAEROBIC Blood Culture adequate volume   Culture  Setup Time   Final    GRAM POSITIVE COCCI IN CLUSTERS IN BOTH AEROBIC AND ANAEROBIC BOTTLES CRITICAL RESULT CALLED TO, READ BACK BY AND VERIFIED WITH: Cristopher Estimable PharmD 10:15 01/18/19 ([email or web address]) Performed at [facility], [place] [address], [place], [place] [address]    Culture STAPHYLOCOCCUS AUREUS (A)  Final   Report Status 01/20/2019 FINAL  Final   Organism ID, Bacteria STAPHYLOCOCCUS AUREUS  Final      Susceptibility   Staphylococcus aureus - MIC*    CIPROFLOXACIN <=0.5 SENSITIVE Sensitive     ERYTHROMYCIN <=0.25 SENSITIVE Sensitive     GENTAMICIN <=0.5 SENSITIVE Sensitive     OXACILLIN <=0.25 SENSITIVE Sensitive     TETRACYCLINE <=1 SENSITIVE Sensitive     VANCOMYCIN <=0.5 SENSITIVE Sensitive     TRIMETH/SULFA <=10 SENSITIVE Sensitive     CLINDAMYCIN <=0.25 SENSITIVE Sensitive     RIFAMPIN  <=0.5 SENSITIVE Sensitive     Inducible Clindamycin NEGATIVE Sensitive     * STAPHYLOCOCCUS AUREUS  Blood Culture ID Panel (Reflexed)     Status: Abnormal   Collection Time: 01/17/19  9:08 PM  Result Value Ref Range Status   Enterococcus species NOT DETECTED NOT DETECTED Final   Listeria monocytogenes NOT DETECTED NOT DETECTED Final   Staphylococcus  species DETECTED (A) NOT DETECTED Final    Comment: CRITICAL RESULT CALLED TO, READ BACK BY AND VERIFIED WITH: Cristopher Estimable PharmD 10:15 01/18/19 ([email or web address])    Staphylococcus aureus (BCID) DETECTED (A) NOT DETECTED Final    Comment: Methicillin (oxacillin) susceptible Staphylococcus aureus (MSSA). Preferred therapy is anti staphylococcal beta lactam antibiotic (Cefazolin or Nafcillin), unless clinically contraindicated. CRITICAL RESULT CALLED TO, READ BACK BY AND VERIFIED WITH: Cristopher Estimable PharmD 10:15 01/18/19 ([email or web address])    Methicillin resistance NOT DETECTED NOT DETECTED Final   Streptococcus species NOT DETECTED NOT DETECTED Final   Streptococcus agalactiae NOT DETECTED NOT DETECTED Final   Streptococcus pneumoniae NOT DETECTED NOT DETECTED Final   Streptococcus pyogenes NOT DETECTED NOT DETECTED Final   Acinetobacter baumannii NOT DETECTED NOT DETECTED Final   Enterobacteriaceae species NOT DETECTED NOT DETECTED Final   Enterobacter cloacae complex NOT DETECTED NOT DETECTED Final   Escherichia coli NOT DETECTED NOT DETECTED Final   Klebsiella oxytoca NOT DETECTED NOT DETECTED Final   Klebsiella pneumoniae NOT DETECTED NOT DETECTED Final   Proteus species NOT DETECTED NOT DETECTED Final   Serratia marcescens NOT DETECTED NOT DETECTED Final   Haemophilus influenzae NOT DETECTED NOT DETECTED Final   Neisseria meningitidis NOT DETECTED NOT DETECTED Final   Pseudomonas aeruginosa NOT DETECTED NOT DETECTED Final   Candida albicans NOT DETECTED NOT DETECTED Final   Candida glabrata NOT DETECTED NOT DETECTED Final   Candida krusei NOT DETECTED  NOT DETECTED Final   Candida parapsilosis NOT DETECTED NOT DETECTED Final   Candida tropicalis NOT DETECTED NOT DETECTED Final    Comment: Performed at [facility], [address], [place], [place] [address]  SARS Coronavirus 2 (CEPHEID- Performed in [place] hospital lab), Hosp Order     Status: None   Collection Time: 01/18/19  2:02 AM   Specimen: Nasopharyngeal Swab  Result Value Ref Range Status   SARS Coronavirus 2 NEGATIVE NEGATIVE Final    Comment: (NOTE) If result is NEGATIVE SARS-CoV-2 target nucleic acids are NOT DETECTED. The SARS-CoV-2 RNA is generally detectable in upper and lower  respiratory specimens during the acute phase of infection. The lowest  concentration of SARS-CoV-2 viral copies this assay can detect is 250  copies / mL. A negative result does not preclude SARS-CoV-2 infection  and should not be used as the sole basis for treatment or other  patient management decisions.  A negative result may occur with  improper specimen collection / handling, submission of specimen other  than nasopharyngeal swab, presence of viral mutation(s) within the  areas targeted by this assay, and inadequate number of viral copies  (<250 copies / mL). A negative result must be combined with clinical  observations, patient history, and epidemiological information. If result is POSITIVE SARS-CoV-2 target nucleic acids are DETECTED. The SARS-CoV-2 RNA is generally detectable in upper and lower  respiratory specimens dur ing the acute phase of infection.  Positive  results are indicative of active infection with SARS-CoV-2.  Clinical  correlation with patient history and other diagnostic information is  necessary to determine patient infection status.  Positive results do  not rule out bacterial infection or co-infection with other viruses. If result is PRESUMPTIVE POSTIVE SARS-CoV-2 nucleic acids MAY BE PRESENT.   A presumptive positive result was obtained on the submitted  specimen  and confirmed on repeat testing.  While 2019 novel coronavirus  (SARS-CoV-2) nucleic acids may be present in the submitted sample  additional confirmatory testing may be necessary for epidemiological  and / or clinical management purposes  to differentiate between  SARS-CoV-2 and other Sarbecovirus currently known to infect humans.  If clinically indicated additional testing with an alternate test  methodology [phone]) is advised. The SARS-CoV-2 RNA is generally  detectable in upper and lower respiratory sp ecimens during the acute  phase of infection. The expected result is Negative. Fact Sheet for Patients:  StrictlyIdeas.no Fact Sheet for Healthcare Providers: BankingDealers.co.za This test is not yet approved or cleared by the [place] FDA and has been authorized for detection and/or diagnosis of SARS-CoV-2 by FDA under an Emergency Use Authorization (EUA).  This EUA will remain in effect (meaning this test can be used) for the duration of the COVID-19 declaration under Section 564(b)(1) of the Act, 21 U.S.C. section 360bbb-3(b)(1), unless the authorization is terminated or revoked sooner. Performed at [facility], [place] [address] [place], [place] [address]   MRSA PCR Screening     Status: None   Collection Time: 01/20/19  7:07 AM   Specimen: Nasal Mucosa; Nasopharyngeal  Result Value Ref Range Status   MRSA by PCR NEGATIVE NEGATIVE Final    Comment:        The GeneXpert MRSA Assay (FDA approved for NASAL specimens only), is one component of a comprehensive MRSA colonization surveillance program. It is not intended to diagnose MRSA infection nor to guide or monitor treatment for MRSA infections. Performed at [facility], [place] [address], [place], [place] [address]     Michel Bickers, [place] [facility] [phone] pager   [phone] cell  01/20/2019, 11:11 AM

## 2019-01-20 NOTE — Progress Notes (Addendum)
I discussed / reviewed the pharmacy note by Dr. Devin Going and I agree with the resident's findings and plans as documented. INR Goal 2-3 per outpatient visits notes.   Nicole Cella, RPh Clinical Pharmacist 01/20/2019 5:03 PM    ANTICOAGULATION CONSULT NOTE - Follow Up Consult  Pharmacy Consult for Warfarin Indication: Mechanical Aortic Valve  No Known Allergies  Patient Measurements: Height: 5\' 3"  (160 cm) Weight: 160 lb (72.6 kg) IBW/kg (Calculated) : 52.4  Vital Signs: Temp: 98 F (36.7 C) (07/10 0844) Temp Source: Oral (07/10 0844) BP: 142/63 (07/09 2351) Pulse Rate: 80 (07/10 0244)  Labs: Recent Labs    01/17/19 2107 01/18/19 0004 01/18/19 0312 01/18/19 0543 01/18/19 1115 01/19/19 0404 01/20/19 0241  HGB 15.9*  --   --  14.6  --   --   --   HCT 46.3*  --   --  42.7  --   --   --   PLT 148*  --   --  111*  --   --   --   APTT  --   --   --  45*  --   --   --   LABPROT 22.7*  --   --  22.6*  --  29.6* 36.8*  INR 2.0*  --   --  2.0*  --  2.9* 3.8*  CREATININE 1.67*  --   --  1.75* 1.42* 1.17* 0.99  TROPONINIHS  --  535* 679*  --   --   --   --     Estimated Creatinine Clearance: 57 mL/min (by C-G formula based on SCr of 0.99 mg/dL).  Assessment: 25 yof with PMH of CAD s/p CABGx2, mechanical aortic valve, hypothydroidism, CVA, HTN, and dyslipidemia. She [facility]2020with a chief complaint of confusion, fever, and chills.  Patient is now on sepsis protocol due to MSSA detected in the blood. TEE did not show any vegetation.   Heparin ordered initially during sepsis workup but changed to warfarin per Dr. Tawanna Solo since INR was therapeutic at 2.0. Resumed warfarin with 4.5 mg, INR 2.0>>2.9. Received warfarin 3mg  7/9, INR 7/10 supratherapeutic at 3.8. PLT trended down, 148>111.  PTA warfarin 4.5 mg Monday & Friday, 3 mg all other days  Goal of Therapy:  INR 2-3 Monitor platelets by anticoagulation protocol: Yes   Plan:  Hold warfarin   Daily PT/INR     Mimi L. Devin Going, PharmD, [place] PGY1 Pharmacy Resident [employer]: [phone]  Mobile: [phone] 01/20/19 10:23 AM  Please check AMION for all [place] phone numbers After 10:00 PM, call the [place] [phone]

## 2019-01-21 ENCOUNTER — Inpatient Hospital Stay: Payer: Self-pay

## 2019-01-21 LAB — COMPREHENSIVE METABOLIC PANEL
ALT: 19 U/L (ref 0–44)
AST: 33 U/L (ref 15–41)
Albumin: 2.5 g/dL — ABNORMAL LOW (ref 3.5–5.0)
Alkaline Phosphatase: 88 U/L (ref 38–126)
Anion gap: 9 (ref 5–15)
BUN: 10 mg/dL (ref 8–23)
CO2: 19 mmol/L — ABNORMAL LOW (ref 22–32)
Calcium: 8.1 mg/dL — ABNORMAL LOW (ref 8.9–10.3)
Chloride: 110 mmol/L (ref 98–111)
Creatinine, Ser: 1.07 mg/dL — ABNORMAL HIGH (ref 0.44–1.00)
GFR calc Af Amer: >60 mL/min (ref >60)
GFR calc non Af Amer: 56 mL/min — ABNORMAL LOW (ref >60)
Glucose, Bld: 110 mg/dL — ABNORMAL HIGH (ref 70–99)
Potassium: 3.6 mmol/L (ref 3.5–5.1)
Sodium: 138 mmol/L (ref 135–145)
Total Bilirubin: 0.7 mg/dL (ref 0.3–1.2)
Total Protein: 6.2 g/dL — ABNORMAL LOW (ref 6.5–8.1)

## 2019-01-21 LAB — CBC WITH DIFFERENTIAL/PLATELET
Abs Immature Granulocytes: 0.05 10*3/uL (ref 0.00–0.07)
Basophils Absolute: 0 10*3/uL (ref 0.0–0.1)
Basophils Relative: 0 %
Eosinophils Absolute: 0 10*3/uL (ref 0.0–0.5)
Eosinophils Relative: 1 %
HCT: 36.8 % (ref 36.0–46.0)
Hemoglobin: 12.7 g/dL (ref 12.0–15.0)
Immature Granulocytes: 1 %
Lymphocytes Relative: 12 %
Lymphs Abs: 0.9 10*3/uL (ref 0.7–4.0)
MCH: 30.6 pg (ref 26.0–34.0)
MCHC: 34.5 g/dL (ref 30.0–36.0)
MCV: 88.7 fL (ref 80.0–100.0)
Monocytes Absolute: 0.7 10*3/uL (ref 0.1–1.0)
Monocytes Relative: 10 %
Neutro Abs: 5.6 10*3/uL (ref 1.7–7.7)
Neutrophils Relative %: 76 %
Platelets: 177 10*3/uL (ref 150–400)
RBC: 4.15 MIL/uL (ref 3.87–5.11)
RDW: 13.6 % (ref 11.5–15.5)
WBC: 7.2 10*3/uL (ref 4.0–10.5)
nRBC: 0 % (ref 0.0–0.2)

## 2019-01-21 LAB — PROTIME-INR
INR: 4.5 (ref 0.8–1.2)
Prothrombin Time: 42.3 seconds — ABNORMAL HIGH (ref 11.4–15.2)

## 2019-01-21 MED ORDER — HEPARIN SOD (PORK) LOCK FLUSH 100 UNIT/ML IV SOLN: 250.0000 [IU] | INTRAVENOUS | Status: DC | PRN

## 2019-01-21 MED ORDER — SODIUM CHLORIDE 0.9% FLUSH: 10.0000 mL | INTRAVENOUS | Status: DC | PRN

## 2019-01-21 MED ORDER — SODIUM CHLORIDE 0.9% FLUSH: 10.0000 mL | Freq: Two times a day (BID) | INTRAVENOUS | Status: DC

## 2019-01-21 NOTE — Progress Notes (Signed)
Peripherally Inserted Central Catheter/Midline Placement  The IV Nurse has discussed with the patient and/or persons authorized to consent for the patient, the purpose of this procedure and the potential benefits and risks involved with this procedure.  The benefits include less needle sticks, lab draws from the catheter, and the patient may be discharged home with the catheter. Risks include, but not limited to, infection, bleeding, blood clot (thrombus formation), and puncture of an artery; nerve damage and irregular heartbeat and possibility to perform a PICC exchange if needed/ordered by physician.  Alternatives to this procedure were also discussed.  B[employer] Power PICC patient education guide, fact sheet on infection prevention and patient information card has been provided to patient /or left at bedside.    PICC/Midline Placement Documentation  PICC Single Lumen 01/21/19 PICC Right Brachial 41 cm 0 cm (Active)  Indication for Insertion or Continuance of Line Home intravenous therapies (PICC only) 01/21/19 1412  Exposed Catheter (cm) 0 cm 01/21/19 1412  Site Assessment Clean;Intact;Dry 01/21/19 1412  Line Status Flushed;Blood return noted 01/21/19 1412  Dressing Type Transparent 01/21/19 1412  Dressing Status Clean;Dry;Intact;Antimicrobial disc in place 01/21/19 1412  Dressing Intervention New dressing 01/21/19 1412  Dressing Change Due 01/28/19 01/21/19 1412       Christella Noa Albarece 01/21/2019, 2:13 PM

## 2019-01-21 NOTE — Discharge Instructions (Signed)
Bacteremia, Adult Bacteremia is the presence of bacteria in the blood. When bacteria enter the bloodstream, they can cause a life-threatening reaction called sepsis, which is a medical emergency. Bacteremia can spread to other parts of the body, including the heart, joints, and brain. What are the causes? This condition is caused by bacteria that get into the blood.  Bacteria can enter the blood: ? From a skin infection or injury, such as a burn or a cut. ? From a lung infection (pneumonia). ? From an infection in your stomach or intestines (gastrointestinal infection). ? From an infection in your bladder or urinary system (urinary tract infection). ? During a dental or medical procedure. ? From bleeding gums. ? When a bacterial infection in another part of your body spreads to your blood. ? Through an unclean (contaminated) needle. What increases the risk? This condition is more likely to develop in children, the elderly, and people who:  Have a long-term (chronic) disease or condition like diabetes or chronic kidney failure.  Have an artificial joint or heart valve.  Have heart valve disease.  Have a tube inserted to treat a medical condition, such as a urinary catheter or IV.  Have a weak disease-fighting system (immune system).  Inject illegal drugs.  Have been hospitalized for more than 10 days in a row. What are the signs or symptoms? Symptoms of this condition include:  Fever.  Chills.  Fast heartbeat.  Shortness of breath.  Dizziness.  Weakness.  Confusion.  Nausea or vomiting.  Diarrhea.  Low blood pressure.  Decreased urine output. Bacteremia that has spread to other parts of the body may cause symptoms in those areas. In some cases, there are no symptoms. How is this diagnosed? This condition may be diagnosed with a physical exam and tests, such as:  A complete blood count (CBC). This test checks for signs of infection.  Blood cultures. These  check for bacteria in your blood.  Tests of any tubes that you have had inserted. These tests check for a source of infection.  Urine tests, including urine cultures. These check for bacteria in the urine that could be a source of infection.  Imaging tests, such as an X-ray, CT scan, MRI, or heart ultrasound. These check for a source of infection in other parts of your body, such as your lungs, heart valves, or joints. How is this treated? This condition is usually treated in the hospital. Treatment may involve:  Antibiotic medicines. These may be given by mouth (orally) or directly into your blood through an IV (infusion through your vein). ? Depending on the source of infection, you may need antibiotics for several weeks. ? At first, you may be given an antibiotic to kill most types of blood bacteria (broad-spectrum antibiotic). If your test results show that a certain kind of bacteria is causing the problem, you may be given a different antibiotic to kill that specific bacteria.  IV fluids.  Removing any catheter or device that could be a source of infection.  Blood pressure and breathing support, if needed.  Surgery to control the source or the spread of infection, such as surgery to remove an infected device, abscess, or tissue.  Having follow-up visits for medicines, blood tests, and further evaluation. Follow these instructions at home: Medicines  Take over-the-counter and prescription medicines only as told by your health care provider.  If you were prescribed an antibiotic medicine, take it as told by your health care provider. Do not stop taking the   antibiotic even if you start to feel better. General instructions   Rest as needed. Ask your health care provider when you may return to normal activities.  Drink enough fluid to keep your urine pale yellow.  Do not use any products that contain nicotine or tobacco, such as cigarettes and e-cigarettes. If you need help  quitting, ask your health care provider.  Keep all follow-up visits as told by your health care provider. This is important. How is this prevented?   Wash your hands regularly with soap and water. If soap and water are not available, use hand sanitizer.  You should wash your hands: ? After using the toilet or changing a diaper. ? Before preparing, cooking, or serving food. ? While caring for a sick person or while visiting someone in a hospital. ? Before and after changing bandages (dressings) over wounds.  Clean any scrapes or cuts with soap and water and cover them with clean dressings.  Get vaccinations as recommended by your health care provider.  Practice good oral hygiene. Brush your teeth two times a day, and floss regularly.  Take good care of your skin. This includes bathing and moisturizing on a regular basis. Get help right away if you have:  Pain.  A fever or chills.  Trouble breathing.  A fast heart rate.  Skin that is blotchy, pale, or clammy.  Confusion.  Weakness.  Lack of energy (lethargy) or unusual sleepiness.  Diarrhea.  New symptoms that develop after treatment has started. These symptoms may represent a serious problem that is an emergency. Do not wait to see if the symptoms will go away. Get medical help right away. Call your local emergency services (911 in the [place] Do not drive yourself to the hospital. Summary  Bacteremia is the presence of bacteria in the blood. When bacteria enter the bloodstream, they can cause a life-threatening reaction called sepsis.  Some symptoms of bacteremia include fever, chills, shortness of breath, confusion, nausea or vomiting, and diarrhea.  Tests may be done to find the source of infection that led to bacteremia. These tests may include blood tests, urine tests, and imaging tests.  Bacteremia is usually treated with antibiotic medicines in the hospital.  Get help right away if you have any new symptoms  that develop after treatment has started. This information is not intended to replace advice given to you by your health care provider. Make sure you discuss any questions you have with your health care provider. Document Released: 04/12/2006 Document Revised: 11/08/2017 Document Reviewed: 11/08/2017 Elsevier Patient Education  2020 Elsevier Inc.  

## 2019-01-21 NOTE — Progress Notes (Signed)
ANTICOAGULATION CONSULT NOTE - Follow Up Consult  Pharmacy Consult for Warfarin Indication: Mechanical Aortic Valve  No Known Allergies  Patient Measurements: Height: 5\' 3"  (160 cm) Weight: 159 lb 2.8 oz (72.2 kg) IBW/kg (Calculated) : 52.4  Vital Signs: Temp: 98.5 F (36.9 C) (07/11 0348) Temp Source: Oral (07/11 0348)  Labs: Recent Labs    01/19/19 0404 01/20/19 0241 01/21/19 0340  HGB  --   --  12.7  HCT  --   --  36.8  PLT  --   --  177  LABPROT 29.6* 36.8* 42.3*  INR 2.9* 3.8* 4.5*  CREATININE 1.17* 0.99 1.07*    Estimated Creatinine Clearance: 52.6 mL/min (A) (by C-G formula based on SCr of 1.07 mg/dL (H)).  Assessment: 80 yof with PMH of CAD s/p CABGx2, mechanical aortic valve, hypothydroidism, CVA, HTN, and dyslipidemia. She presented to [facility] on 7/7/2020with a chief complaint of confusion, fever, and chills.  Patient is now on sepsis protocol due to MSSA detected in the blood. TEE did not show any vegetation.   Heparin ordered initially during sepsis workup but changed to warfarin per Dr. Tawanna Solo since INR was therapeutic at 2.0. Resumed warfarin with 4.5 mg, INR 2.0>>2.9. Marland Kitchen Received warfarin 3mg  7/9, INR 7/11 supratherapeutic at 4.5. PLT improving, 111>177  PTA warfarin 4.5 mg Monday & Friday, 3 mg all other days  Goal of Therapy:  INR 2-3 Monitor platelets by anticoagulation protocol: Yes   Plan:  Hold warfarin  Daily PT/INR     Astraea Gaughran L. Devin Going, PharmD, [place] PGY1 Pharmacy Resident [employer]: [phone]  Mobile: [phone] 01/21/19 8:31 AM  Please check AMION for all [place] phone numbers After 10:00 PM, call the [place] [phone]

## 2019-01-21 NOTE — Progress Notes (Signed)
Pharmacy Antibiotic Note  Elizabeth Morton is a 63 y.o. female admitted on 01/17/2019 with MSSA bacteremia.  Pharmacy has been consulted for cefazolin dosing. WBC WNL, afebrile.  Plan: Continue cefazolin 2 g Q8H Trend WBC, temp, renal fxn  Height: 5\' 3"  (160 cm) Weight: 159 lb 2.8 oz (72.2 kg) IBW/kg (Calculated) : 52.4  Temp (24hrs), Avg:98.1 F (36.7 C), Min:97.5 F (36.4 C), Max:98.6 F (37 C)  Recent Labs  Lab 01/17/19 2107 01/17/19 2108 01/18/19 0004 01/18/19 0543 01/18/19 0842 01/18/19 1115 01/19/19 0404 01/20/19 0241 01/21/19 0340  WBC 9.0  --   --  7.0  --   --   --   --  7.2  CREATININE 1.67*  --   --  1.75*  --  1.42* 1.17* 0.99 1.07*  LATICACIDVEN  --  3.7* 3.3* 2.9* 2.4* 1.5  --   --   --     Estimated Creatinine Clearance: 52.6 mL/min (A) (by C-G formula based on SCr of 1.07 mg/dL (H)).    No Known Allergies  Antimicrobials this admission: Cefazolin 7/8 >>   Microbiology results: 7/7 BCx: MSSA   Thank you for allowing pharmacy to be a part of this patient's care.   Novice Vrba L. Devin Going, PharmD, [facility] PGY1 Pharmacy Resident [employer]: [phone]  Mobile: [phone] 01/21/19 8:42 AM  Please check AMION for all [facility] phone numbers After 10:00 PM, call the [facility] [phone]

## 2019-01-21 NOTE — Care Management (Signed)
Verified w [place] and IV infusions that HH will begin tomorrow am, and patient/ spouse will have meds at home for 10pm dose and have been trained. Anticipate DC after PICC line placement. No other CM needs

## 2019-01-21 NOTE — Discharge Summary (Addendum)
Physician Discharge Summary  Elizabeth Morton ZOX:[id number] DOB: [birth year] DOA: 01/17/2019  PCP: Levin Erp, MD  Admit date: 01/17/2019 Discharge date: 01/21/2019  Admitted From: Home Disposition: Home  Recommendations for Outpatient Follow-up:  1. Follow up with PCP in 1-2 weeks 2. Follow with ID in 2 weeks 3. Please obtain BMP/CBC in one week 4. Please follow up on the following pending results:  Home Health: Yes Equipment/Devices: IV home therapy  Discharge Condition: Stable CODE STATUS: Full code Diet recommendation: Cardiac  Subjective: Patient seen and examined.  She has no complaints.  No pain.  No fever.  Brief/Interim Summary: Patient is a 67 female with history of coronary artery disease status post CABG, mechanical aortic valve, hypothyroidism, CVA, hypertension who was brought to the emergencydepartment after she was found to be confused at home with fevers and chills. In the emergency department she was found to have fever of 103 Fahrenheit. COVID-19 screening test negative. She was also found to have elevated troponin,acute kidney injury. Cardiology consulted. Blood cultures this morning reveal MSSA. ID consulted. Started on cefazolin. She went for TEE today which did not show any vegetation. Her repeat blood culture from 01/19/2019 have been negative more over 48 hours.  She has had new PICC line placed today and per plan and per ID recommendations, she is going to be discharged on IV cefazolin which has been arranged for her and she will follow with her PCP within a week and ID within 2 weeks.  Patient's INR is 4.5 today.  Her Coumadin was held yesterday.  We have advised the patient to hold Coumadin again tonight and take a dose tomorrow.  Discharge Diagnoses:  Principal Problem:   Bacteremia due to methicillin susceptible Staphylococcus aureus (MSSA) Active Problems:   Hx of CABG   Dyslipidemia   CAD (coronary artery disease)   S/P aortic valve  replacement   Aortic stenosis   Hypothyroidism   Chronic hepatitis C without hepatic coma ([place])   Sepsis ([place])   Acute kidney injury ([place])   Thrombocytopenia [facility])    Discharge Instructions  Discharge Instructions    Discharge patient   Complete by: As directed    Discharge disposition: 06-Home-Health Care Svc   Discharge patient date: 01/21/2019   Home infusion instructions [facility] May follow [place] Dosing Protocol; May administer Cathflo as needed to maintain patency of vascular access device.; Flushing of vascular access device: per [facility] Protocol: 0.9% NaCl pre/post medica...   Complete by: As directed    Instructions: May follow [place] Dosing Protocol   Instructions: May administer Cathflo as needed to maintain patency of vascular access device.   Instructions: Flushing of vascular access device: per [facility] Protocol: 0.9% NaCl pre/post medication administration and prn patency; Heparin 100 u/ml, 63m for implanted ports and Heparin 10u/ml, 572mfor all other central venous catheters.   Instructions: May follow [facility] Anaphylaxis Protocol for First Dose Administration in the home: 0.9% NaCl at 25-50 ml/hr to maintain IV access for protocol meds. Epinephrine 0.3 ml IV/IM PRN and Benadryl 25-50 IV/IM PRN s/s of anaphylaxis.   Instructions: AdFa[facility]fusion Coordinator (RN) to assist per patient IV care needs in the home PRN.     Allergies as of 01/21/2019   No Known Allergies     Medication List    TAKE these medications   acetaminophen 500 MG tablet Commonly known as: TYLENOL Take 500-1,000 mg by mouth every 6 (six) hours as needed (for pain or headaches).  Calcium 600+D 600-400 MG-UNIT tablet Generic drug: Calcium Carbonate-Vitamin D Take 1 tablet by mouth 2 (two) times daily.   ceFAZolin  IVPB Commonly known as: ANCEF Inject 2 g into the vein every 8 (eight) hours for 26 days. Indication:  MSSA bacteremia Last Day of Therapy:  02/14/2019 Labs -  Once weekly:  CBC/D and BMP, Labs - Every other week:  ESR and CRP   furosemide 20 MG tablet Commonly known as: LASIX Take 20 mg by mouth daily.   levothyroxine 50 MCG tablet Commonly known as: SYNTHROID Take 50 mcg by mouth daily before breakfast.   POTASSIUM PO Take 1 tablet by mouth daily.   PRESERVISION/LUTEIN PO Take 1 capsule by mouth daily with breakfast.   simvastatin 40 MG tablet Commonly known as: ZOCOR Take 1 tablet (40 mg total) by mouth at bedtime.   triazolam 0.25 MG tablet Commonly known as: HALCION Take 0.25 mg by mouth at bedtime.   warfarin 3 MG tablet Commonly known as: COUMADIN Take as directed. If you are unsure how to take this medication, talk to your nurse or doctor. Original instructions: TAKE 1 TABLET (3 MG TOTAL) BY MOUTH AS DIRECTED. What changed:   how much to take  when to take this  additional instructions            Home Infusion Instuctions  (From admission, onward)         Start     Ordered   01/20/19 0000  Home infusion instructions [facility] May follow [place] Dosing Protocol; May administer Cathflo as needed to maintain patency of vascular access device.; Flushing of vascular access device: per [facility] Protocol: 0.9% NaCl pre/post medica...    Question Answer Comment  Instructions May follow [place] Dosing Protocol   Instructions May administer Cathflo as needed to maintain patency of vascular access device.   Instructions Flushing of vascular access device: per [facility] Protocol: 0.9% NaCl pre/post medication administration and prn patency; Heparin 100 u/ml, 58m for implanted ports and Heparin 10u/ml, 533mfor all other central venous catheters.   Instructions May follow [facility] Anaphylaxis Protocol for First Dose Administration in the home: 0.9% NaCl at 25-50 ml/hr to maintain IV access for protocol meds. Epinephrine 0.3 ml IV/IM PRN and Benadryl 25-50 IV/IM PRN s/s of anaphylaxis.   Instructions [facility]  Infusion Coordinator (RN) to assist per patient IV care needs in the home PRN.      01/20/19 1441         [facility].   Why: Home Health RN arranged for IV antibiotics.  Contact information: [phone]     GrLevin ErpMD Follow up in 1 week(s).   Specialty: Internal Medicine Contact information: [address] [phone]        No Known Allergies  Consultations: ID   Procedures/Studies: Ct Abdomen Pelvis Wo Contrast  Result Date: 01/18/2019 CLINICAL DATA:  Fever and back pain EXAM: CT ABDOMEN AND PELVIS WITHOUT CONTRAST TECHNIQUE: Multidetector CT imaging of the abdomen and pelvis was performed following the standard protocol without IV contrast. COMPARISON:  CT dated May 28, 2016. FINDINGS: Lower chest: The lung bases are clear. The heart size is normal. Hepatobiliary: There is likely borderline hepatic steatosis. Status post cholecystectomy.There is no biliary ductal dilation. Pancreas: Normal contours without ductal dilatation. No peripancreatic fluid collection. Spleen: No splenic laceration or hematoma. Adrenals/Urinary Tract: --  Adrenal glands: No adrenal hemorrhage. --Right kidney/ureter: No hydronephrosis or perinephric hematoma. --Left kidney/ureter: There is some fat stranding about the left kidney which is slightly increased from prior study. There may be punctate nonobstructing stones in the interpolar region of the left kidney. There is no left-sided hydronephrosis. --Urinary bladder: Unremarkable. Stomach/Bowel: --Stomach/Duodenum: There is a moderate-sized hiatal hernia. --Small bowel: No dilatation or inflammation. --Colon: The colon is decompressed which limits evaluation. There is no definite CT evidence for colitis. --Appendix: Normal. Vascular/Lymphatic: Atherosclerotic calcification is present within the non-aneurysmal abdominal aorta, without  hemodynamically significant stenosis. --No retroperitoneal lymphadenopathy. --No mesenteric lymphadenopathy. --No pelvic or inguinal lymphadenopathy. Reproductive: Unremarkable Other: No ascites or free air. There is a small fat containing ventral wall hernia. Musculoskeletal. Multilevel degenerative disc disease and facet arthrosis. No bony spinal canal stenosis. IMPRESSION: 1. Slight interval increase in fat stranding about the left kidney without a clear identifiable cause. There is no left-sided hydronephrosis. There is a punctate nonobstructing stone in the interpolar region of the left kidney. Correlation with urinalysis is recommended to help exclude an ascending urinary tract infection. 2. Multiple chronic findings as above. Electronically Signed   By: Constance Holster M.D.   On: 01/18/2019 00:53   Dg Chest 2 View  Result Date: 01/17/2019 CLINICAL DATA:  Suspected sepsis. Fever, back pain. History of hypertension, CAD, CABG. EXAM: CHEST - 2 VIEW COMPARISON:  Chest radiograph 05/28/2016 FINDINGS: Postsurgical changes from prior CABG and aortic valve replacement. Sternotomy wires remain intact and aligned. Cardiomediastinal contours are otherwise unremarkable. Streaky areas of subsegmental atelectasis at the bases. Lungs are otherwise clear. Multilevel discogenic and changes in the spine. Cholecystectomy clips in the right upper quadrant. No acute osseous or soft tissue abnormality. IMPRESSION: Bibasilar atelectasis.  No other acute cardiopulmonary abnormality. Electronically Signed   By: MD Lovena Le   On: 01/17/2019 21:23   Ct Head Wo Contrast  Result Date: 01/18/2019 CLINICAL DATA:  Altered level of consciousness. EXAM: CT HEAD WITHOUT CONTRAST TECHNIQUE: Contiguous axial images were obtained from the base of the skull through the vertex without intravenous contrast. COMPARISON:  None. FINDINGS: Brain: No evidence of acute infarction, hemorrhage, hydrocephalus, extra-axial collection or mass  lesion/mass effect. There is an old right MCA territory infarct. There is volume loss with chronic microvascular ischemic changes. Vascular: No hyperdense vessel or unexpected calcification. Skull: Normal. Negative for fracture or focal lesion. Sinuses/Orbits: No acute finding. Other: None. IMPRESSION: 1. No acute intracranial abnormality. 2. Old right MCA territory infarct. Electronically Signed   By: Constance Holster M.D.   On: 01/18/2019 00:58   Mr Thoracic Spine W Wo Contrast  Result Date: 01/18/2019 CLINICAL DATA:  Initial evaluation for acute fever, back pain, altered mental status. Concern for infection or malignancy. EXAM: MRI THORACIC AND LUMBAR SPINE WITHOUT AND WITH CONTRAST TECHNIQUE: Multiplanar and multiecho pulse sequences of the thoracic and lumbar spine, were obtained without and with intravenous contrast. CONTRAST:  7 cc of Gadavist. COMPARISON:  None available. FINDINGS: MRI THORACIC SPINE FINDINGS Alignment: Mild dextroscoliosis. Alignment otherwise normal with preservation of the normal thoracic kyphosis. No listhesis or subluxation. Vertebrae: Vertebral body height maintained without evidence for acute or chronic fracture. Bone marrow signal intensity within normal limits. Few scattered benign hemangiomas noted, most prominent of which position within the T7 vertebral body and measures 1 cm. No other discrete or worrisome osseous lesions. No evidence for malignancy. Reactive marrow edema and enhancement about the anterior aspect of the T8-9 interspace felt to be degenerative in  nature. No evidence for osteomyelitis discitis or septic arthritis. No other acute infection within the thoracic spine. Cord: Signal intensity within the thoracic spinal cord is normal. No epidural abscess or other collection. Prominent dorsal epidural fat noted within the midthoracic spine. Paraspinal tissues: Paraspinous soft tissues demonstrate no acute finding. Trace layering right pleural effusion with  associated right basilar atelectatic changes. Visualized lungs otherwise grossly clear. Visualized visceral structures within normal limits. Disc levels: T12-L1: Small right paracentral disc protrusion mildly indents the right ventral thecal sac (series 23, image 38). No significant stenosis or impingement. No other significant disc pathology seen within the thoracic spine. No significant facet degeneration. No canal or neural foraminal stenosis. No impingement. MRI LUMBAR SPINE FINDINGS Segmentation: Standard. Lowest well-formed disc labeled the L5-S1 level. Alignment: 4 mm anterolisthesis of L5 on S1. Trace retrolisthesis of L2 on L3 and L3 on L4. Exaggeration of the normal lumbar lordosis. Vertebrae: Vertebral body height maintained without evidence for acute or chronic fracture. Bone marrow signal intensity within normal limits. No discrete or worrisome osseous lesions. No evidence for malignancy. No abnormal marrow edema or enhancement to suggest osteomyelitis discitis or septic arthritis. Conus medullaris and cauda equina: Conus extends to the L1 level. Conus and cauda equina appear normal. No epidural abscess or other collections. Paraspinal and other soft tissues: Paraspinous soft tissues demonstrate no acute finding. Moderate distension of the partially visualized urinary bladder. Mild stranding about the left greater than right kidneys, better seen on prior CT. Visualized visceral structures otherwise unremarkable. Disc levels: L1-2:  Mild annular disc bulge.  No stenosis or impingement. L2-3: Mild annular disc bulge. Small posterior annular fissure. Mild facet hypertrophy. No significant canal or foraminal stenosis. L3-4: Mild annular disc bulge with disc desiccation. Moderate to advanced facet and ligament flavum hypertrophy. Resultant moderate spinal stenosis. Mild left greater than right L3 foraminal narrowing. L4-5: Negative interspace. Prior posterior fusion. No canal or foraminal stenosis. L5-S1:  Anterolisthesis. Associated broad posterior pseudo disc bulge/uncovering. Prior posterior fusion. No canal or foraminal stenosis. No impingement. IMPRESSION: 1. No MRI evidence for acute abnormality within the thoracic or lumbar spine. No evidence for infection or malignancy. 2. Small right paracentral disc protrusion at T12-L1 without stenosis or impingement. No other significant disc pathology or stenosis within the thoracic spine. 3. Mild disc bulging with advanced facet hypertrophy at L3-4 with resultant moderate spinal stenosis, with mild bilateral L3 foraminal narrowing. 4. Prior posterior fusion at L4-5 and L5-S1 without residual or recurrent stenosis. Electronically Signed   By: Jeannine Boga M.D.   On: 01/18/2019 05:11   Mr Lumbar Spine W Wo Contrast  Result Date: 01/18/2019 CLINICAL DATA:  Initial evaluation for acute fever, back pain, altered mental status. Concern for infection or malignancy. EXAM: MRI THORACIC AND LUMBAR SPINE WITHOUT AND WITH CONTRAST TECHNIQUE: Multiplanar and multiecho pulse sequences of the thoracic and lumbar spine, were obtained without and with intravenous contrast. CONTRAST:  7 cc of Gadavist. COMPARISON:  None available. FINDINGS: MRI THORACIC SPINE FINDINGS Alignment: Mild dextroscoliosis. Alignment otherwise normal with preservation of the normal thoracic kyphosis. No listhesis or subluxation. Vertebrae: Vertebral body height maintained without evidence for acute or chronic fracture. Bone marrow signal intensity within normal limits. Few scattered benign hemangiomas noted, most prominent of which position within the T7 vertebral body and measures 1 cm. No other discrete or worrisome osseous lesions. No evidence for malignancy. Reactive marrow edema and enhancement about the anterior aspect of the T8-9 interspace felt to be degenerative in nature.  No evidence for osteomyelitis discitis or septic arthritis. No other acute infection within the thoracic spine. Cord:  Signal intensity within the thoracic spinal cord is normal. No epidural abscess or other collection. Prominent dorsal epidural fat noted within the midthoracic spine. Paraspinal tissues: Paraspinous soft tissues demonstrate no acute finding. Trace layering right pleural effusion with associated right basilar atelectatic changes. Visualized lungs otherwise grossly clear. Visualized visceral structures within normal limits. Disc levels: T12-L1: Small right paracentral disc protrusion mildly indents the right ventral thecal sac (series 23, image 38). No significant stenosis or impingement. No other significant disc pathology seen within the thoracic spine. No significant facet degeneration. No canal or neural foraminal stenosis. No impingement. MRI LUMBAR SPINE FINDINGS Segmentation: Standard. Lowest well-formed disc labeled the L5-S1 level. Alignment: 4 mm anterolisthesis of L5 on S1. Trace retrolisthesis of L2 on L3 and L3 on L4. Exaggeration of the normal lumbar lordosis. Vertebrae: Vertebral body height maintained without evidence for acute or chronic fracture. Bone marrow signal intensity within normal limits. No discrete or worrisome osseous lesions. No evidence for malignancy. No abnormal marrow edema or enhancement to suggest osteomyelitis discitis or septic arthritis. Conus medullaris and cauda equina: Conus extends to the L1 level. Conus and cauda equina appear normal. No epidural abscess or other collections. Paraspinal and other soft tissues: Paraspinous soft tissues demonstrate no acute finding. Moderate distension of the partially visualized urinary bladder. Mild stranding about the left greater than right kidneys, better seen on prior CT. Visualized visceral structures otherwise unremarkable. Disc levels: L1-2:  Mild annular disc bulge.  No stenosis or impingement. L2-3: Mild annular disc bulge. Small posterior annular fissure. Mild facet hypertrophy. No significant canal or foraminal stenosis. L3-4:  Mild annular disc bulge with disc desiccation. Moderate to advanced facet and ligament flavum hypertrophy. Resultant moderate spinal stenosis. Mild left greater than right L3 foraminal narrowing. L4-5: Negative interspace. Prior posterior fusion. No canal or foraminal stenosis. L5-S1: Anterolisthesis. Associated broad posterior pseudo disc bulge/uncovering. Prior posterior fusion. No canal or foraminal stenosis. No impingement. IMPRESSION: 1. No MRI evidence for acute abnormality within the thoracic or lumbar spine. No evidence for infection or malignancy. 2. Small right paracentral disc protrusion at T12-L1 without stenosis or impingement. No other significant disc pathology or stenosis within the thoracic spine. 3. Mild disc bulging with advanced facet hypertrophy at L3-4 with resultant moderate spinal stenosis, with mild bilateral L3 foraminal narrowing. 4. Prior posterior fusion at L4-5 and L5-S1 without residual or recurrent stenosis. Electronically Signed   By: Jeannine Boga M.D.   On: 01/18/2019 05:11   [place] Ekg Site Rite  Result Date: 01/21/2019 If Site Rite image not attached, placement could not be confirmed due to current cardiac rhythm.    Discharge Exam: Vitals:   01/21/19 0846 01/21/19 1208  BP: 136/64 126/66  Pulse: 98   Resp: (!) 24   Temp: 98.3 F (36.8 C) 98.2 F (36.8 C)  SpO2: 94%    Vitals:   01/21/19 0348 01/21/19 0844 01/21/19 0846 01/21/19 1208  BP:  136/64 136/64 126/66  Pulse:   98   Resp:   (!) 24   Temp: 98.5 F (36.9 C) 98.3 F (36.8 C) 98.3 F (36.8 C) 98.2 F (36.8 C)  TempSrc: Oral  Oral   SpO2:   94%   Weight: 72.2 kg     Height:        General: Pt is alert, awake, not in acute distress Cardiovascular: RRR, S1/S2 +, no rubs, no gallops  Respiratory: CTA bilaterally, no wheezing, no rhonchi Abdominal: Soft, NT, ND, bowel sounds + Extremities: no edema, no cyanosis    The results of significant diagnostics from this hospitalization  (including imaging, microbiology, ancillary and laboratory) are listed below for reference.     Microbiology: Recent Results (from the past 240 hour(s))  Culture, blood (Routine x 2)     Status: Abnormal   Collection Time: 01/17/19  9:00 PM   Specimen: BLOOD  Result Value Ref Range Status   Specimen Description BLOOD RIGHT HAND  Final   Special Requests   Final    BOTTLES DRAWN AEROBIC ONLY Blood Culture adequate volume   Culture  Setup Time   Final    GRAM POSITIVE COCCI IN CLUSTERS AEROBIC BOTTLE ONLY CRITICAL VALUE NOTED.  VALUE IS CONSISTENT WITH PREVIOUSLY REPORTED AND CALLED VALUE.    Culture (A)  Final    STAPHYLOCOCCUS AUREUS SUSCEPTIBILITIES PERFORMED ON PREVIOUS CULTURE WITHIN THE LAST 5 DAYS. Performed at [facility], [place] [address] [place], [place] [address]    Report Status 01/20/2019 FINAL  Final  Culture, blood (Routine x 2)     Status: Abnormal   Collection Time: 01/17/19  9:08 PM   Specimen: BLOOD  Result Value Ref Range Status   Specimen Description BLOOD RIGHT ARM  Final   Special Requests   Final    BOTTLES DRAWN AEROBIC AND ANAEROBIC Blood Culture adequate volume   Culture  Setup Time   Final    GRAM POSITIVE COCCI IN CLUSTERS IN BOTH AEROBIC AND ANAEROBIC BOTTLES CRITICAL RESULT CALLED TO, READ BACK BY AND VERIFIED WITH: Cristopher Estimable PharmD 10:15 01/18/19 ([email or web address]) Performed at [facility], [place] [address] [place], [place] [address]    Culture STAPHYLOCOCCUS AUREUS (A)  Final   Report Status 01/20/2019 FINAL  Final   Organism ID, Bacteria STAPHYLOCOCCUS AUREUS  Final      Susceptibility   Staphylococcus aureus - MIC*    CIPROFLOXACIN <=0.5 SENSITIVE Sensitive     ERYTHROMYCIN <=0.25 SENSITIVE Sensitive     GENTAMICIN <=0.5 SENSITIVE Sensitive     OXACILLIN <=0.25 SENSITIVE Sensitive     TETRACYCLINE <=1 SENSITIVE Sensitive     VANCOMYCIN <=0.5 SENSITIVE Sensitive     TRIMETH/SULFA <=10 SENSITIVE Sensitive     CLINDAMYCIN <=0.25  SENSITIVE Sensitive     RIFAMPIN <=0.5 SENSITIVE Sensitive     Inducible Clindamycin NEGATIVE Sensitive     * STAPHYLOCOCCUS AUREUS  Blood Culture ID Panel (Reflexed)     Status: Abnormal   Collection Time: 01/17/19  9:08 PM  Result Value Ref Range Status   Enterococcus species NOT DETECTED NOT DETECTED Final   Listeria monocytogenes NOT DETECTED NOT DETECTED Final   Staphylococcus species DETECTED (A) NOT DETECTED Final    Comment: CRITICAL RESULT CALLED TO, READ BACK BY AND VERIFIED WITH: Cristopher Estimable PharmD 10:15 01/18/19 ([email or web address])    Staphylococcus aureus (BCID) DETECTED (A) NOT DETECTED Final    Comment: Methicillin (oxacillin) susceptible Staphylococcus aureus (MSSA). Preferred therapy is anti staphylococcal beta lactam antibiotic (Cefazolin or Nafcillin), unless clinically contraindicated. CRITICAL RESULT CALLED TO, READ BACK BY AND VERIFIED WITH: Cristopher Estimable PharmD 10:15 01/18/19 ([email or web address])    Methicillin resistance NOT DETECTED NOT DETECTED Final   Streptococcus species NOT DETECTED NOT DETECTED Final   Streptococcus agalactiae NOT DETECTED NOT DETECTED Final   Streptococcus pneumoniae NOT DETECTED NOT DETECTED Final   Streptococcus pyogenes NOT DETECTED NOT DETECTED Final   Acinetobacter baumannii NOT DETECTED NOT  DETECTED Final   Enterobacteriaceae species NOT DETECTED NOT DETECTED Final   Enterobacter cloacae complex NOT DETECTED NOT DETECTED Final   Escherichia coli NOT DETECTED NOT DETECTED Final   Klebsiella oxytoca NOT DETECTED NOT DETECTED Final   Klebsiella pneumoniae NOT DETECTED NOT DETECTED Final   Proteus species NOT DETECTED NOT DETECTED Final   Serratia marcescens NOT DETECTED NOT DETECTED Final   Haemophilus influenzae NOT DETECTED NOT DETECTED Final   Neisseria meningitidis NOT DETECTED NOT DETECTED Final   Pseudomonas aeruginosa NOT DETECTED NOT DETECTED Final   Candida albicans NOT DETECTED NOT DETECTED Final   Candida glabrata NOT DETECTED NOT DETECTED  Final   Candida krusei NOT DETECTED NOT DETECTED Final   Candida parapsilosis NOT DETECTED NOT DETECTED Final   Candida tropicalis NOT DETECTED NOT DETECTED Final    Comment: Performed at [facility], [place] [address], [place], [place] [address]  SARS Coronavirus 2 (CEPHEID- Performed in [place] [facility] lab), Hosp Order     Status: None   Collection Time: 01/18/19  2:02 AM   Specimen: Nasopharyngeal Swab  Result Value Ref Range Status   SARS Coronavirus 2 NEGATIVE NEGATIVE Final    Comment: (NOTE) If result is NEGATIVE SARS-CoV-2 target nucleic acids are NOT DETECTED. The SARS-CoV-2 RNA is generally detectable in upper and lower  respiratory specimens during the acute phase of infection. The lowest  concentration of SARS-CoV-2 viral copies this assay can detect is 250  copies / mL. A negative result does not preclude SARS-CoV-2 infection  and should not be used as the sole basis for treatment or other  patient management decisions.  A negative result may occur with  improper specimen collection / handling, submission of specimen other  than nasopharyngeal swab, presence of viral mutation(s) within the  areas targeted by this assay, and inadequate number of viral copies  (<250 copies / mL). A negative result must be combined with clinical  observations, patient history, and epidemiological information. If result is POSITIVE SARS-CoV-2 target nucleic acids are DETECTED. The SARS-CoV-2 RNA is generally detectable in upper and lower  respiratory specimens dur ing the acute phase of infection.  Positive  results are indicative of active infection with SARS-CoV-2.  Clinical  correlation with patient history and other diagnostic information is  necessary to determine patient infection status.  Positive results do  not rule out bacterial infection or co-infection with other viruses. If result is PRESUMPTIVE POSTIVE SARS-CoV-2 nucleic acids MAY BE PRESENT.   A presumptive positive  result was obtained on the submitted specimen  and confirmed on repeat testing.  While 2019 novel coronavirus  (SARS-CoV-2) nucleic acids may be present in the submitted sample  additional confirmatory testing may be necessary for epidemiological  and / or clinical management purposes  to differentiate between  SARS-CoV-2 and other Sarbecovirus currently known to infect humans.  If clinically indicated additional testing with an alternate test  methodology [phone]) is advised. The SARS-CoV-2 RNA is generally  detectable in upper and lower respiratory sp ecimens during the acute  phase of infection. The expected result is Negative. Fact Sheet for Patients:  StrictlyIdeas.no Fact Sheet for Healthcare Providers: BankingDealers.co.za This test is not yet approved or cleared by the [place] FDA and has been authorized for detection and/or diagnosis of SARS-CoV-2 by FDA under an Emergency Use Authorization (EUA).  This EUA will remain in effect (meaning this test can be used) for the duration of the COVID-19 declaration under Section 564(b)(1) of the  Act, 21 U.S.C. section 360bbb-3(b)(1), unless the authorization is terminated or revoked sooner. Performed at [facility], [place] [address] [place], [place] [address]   Culture, blood (routine x 2)     Status: None (Preliminary result)   Collection Time: 01/19/19  6:15 AM   Specimen: BLOOD  Result Value Ref Range Status   Specimen Description BLOOD RIGHT ANTECUBITAL  Final   Special Requests   Final    BOTTLES DRAWN AEROBIC AND ANAEROBIC Blood Culture adequate volume   Culture   Final    NO GROWTH 1 DAY Performed at [facility], [place] [address], [place], [place] [address]    Report Status PENDING  Incomplete  Culture, blood (routine x 2)     Status: None (Preliminary result)   Collection Time: 01/19/19  6:24 AM   Specimen: BLOOD RIGHT HAND  Result Value Ref Range Status    Specimen Description BLOOD RIGHT HAND  Final   Special Requests   Final    BOTTLES DRAWN AEROBIC AND ANAEROBIC Blood Culture adequate volume   Culture   Final    NO GROWTH 1 DAY Performed at [facility], [place] [address] [place], [place] [address]    Report Status PENDING  Incomplete  MRSA PCR Screening     Status: None   Collection Time: 01/20/19  7:07 AM   Specimen: Nasal Mucosa; Nasopharyngeal  Result Value Ref Range Status   MRSA by PCR NEGATIVE NEGATIVE Final    Comment:        The GeneXpert MRSA Assay (FDA approved for NASAL specimens only), is one component of a comprehensive MRSA colonization surveillance program. It is not intended to diagnose MRSA infection nor to guide or monitor treatment for MRSA infections. Performed at [facility], [place] [address], [place], [place] [address]      Labs: BNP (last 3 results) No results for input(s): BNP in the last 8760 hours. Basic Metabolic Panel: Recent Labs  Lab 01/18/19 0543 01/18/19 1115 01/19/19 0404 01/20/19 0241 01/21/19 0340  NA 136 138 140 139 138  K 3.5 3.2* 3.3* 3.3* 3.6  CL 105 110 113* 111 110  CO2 19* 18* 18* 21* 19*  GLUCOSE 128* 130* 111* 116* 110*  BUN _0 CREATININE 1.75* 1.42* 1.17* 0.99 1.07*  CALCIUM 8.5* 8.1* 8.0* 8.1* 8.1*   Liver Function Tests: Recent Labs  Lab 01/17/19 2107 01/18/19 0543 01/21/19 0340  AST 133* 88* 33  ALT 96* 68* 19  ALKPHOS 102 80 88  BILITOT 1.5* 0.9 0.7  PROT 7.6 6.3* 6.2*  ALBUMIN 3.9 3.1* 2.5*   No results for input(s): LIPASE, AMYLASE in the last 168 hours. No results for input(s): AMMONIA in the last 168 hours. CBC: Recent Labs  Lab 01/17/19 2107 01/18/19 0543 01/21/19 0340  WBC 9.0 7.0 7.2  NEUTROABS 8.2* 6.2 5.6  HGB 15.9* 14.6 12.7  HCT 46.3* 42.7 36.8  MCV 89.0 89.3 88.7  PLT 148* 111* 177   Cardiac Enzymes: No results for input(s): CKTOTAL, CKMB, CKMBINDEX, TROPONINI in the last 168 hours. BNP: Invalid input(s):  POCBNP CBG: No results for input(s): GLUCAP in the last 168 hours. D-Dimer No results for input(s): DDIMER in the last 72 hours. Hgb A1c No results for input(s): HGBA1C in the last 72 hours. Lipid Profile No results for input(s): CHOL, HDL, LDLCALC, TRIG, CHOLHDL, LDLDIRECT in the last 72 hours. Thyroid function studies No results for input(s): TSH,  T4TOTAL, T3FREE, THYROIDAB in the last 72 hours.  Invalid input(s): FREET3 Anemia work up No results for input(s): VITAMINB12, FOLATE, FERRITIN, TIBC, IRON, RETICCTPCT in the last 72 hours. Urinalysis    Component Value Date/Time   COLORURINE AMBER (A) 01/17/2019 2313   APPEARANCEUR HAZY (A) 01/17/2019 2313   LABSPEC 1.018 01/17/2019 2313   PHURINE 7.0 01/17/2019 2313   GLUCOSEU NEGATIVE 01/17/2019 2313   HGBUR LARGE (A) 01/17/2019 2313   BILIRUBINUR NEGATIVE 01/17/2019 2313   KETONESUR NEGATIVE 01/17/2019 2313   PROTEINUR >=300 (A) 01/17/2019 2313   NITRITE NEGATIVE 01/17/2019 2313   LEUKOCYTESUR NEGATIVE 01/17/2019 2313   Sepsis Labs Invalid input(s): PROCALCITONIN,  WBC,  LACTICIDVEN Microbiology Recent Results (from the past 240 hour(s))  Culture, blood (Routine x 2)     Status: Abnormal   Collection Time: 01/17/19  9:00 PM   Specimen: BLOOD  Result Value Ref Range Status   Specimen Description BLOOD RIGHT HAND  Final   Special Requests   Final    BOTTLES DRAWN AEROBIC ONLY Blood Culture adequate volume   Culture  Setup Time   Final    GRAM POSITIVE COCCI IN CLUSTERS AEROBIC BOTTLE ONLY CRITICAL VALUE NOTED.  VALUE IS CONSISTENT WITH PREVIOUSLY REPORTED AND CALLED VALUE.    Culture (A)  Final    STAPHYLOCOCCUS AUREUS SUSCEPTIBILITIES PERFORMED ON PREVIOUS CULTURE WITHIN THE LAST 5 DAYS. Performed at [facility], [place] [address] [place], [place] [address]    Report Status 01/20/2019 FINAL  Final  Culture, blood (Routine x 2)     Status: Abnormal   Collection Time: 01/17/19  9:08 PM   Specimen: BLOOD  Result  Value Ref Range Status   Specimen Description BLOOD RIGHT ARM  Final   Special Requests   Final    BOTTLES DRAWN AEROBIC AND ANAEROBIC Blood Culture adequate volume   Culture  Setup Time   Final    GRAM POSITIVE COCCI IN CLUSTERS IN BOTH AEROBIC AND ANAEROBIC BOTTLES CRITICAL RESULT CALLED TO, READ BACK BY AND VERIFIED WITH: Cristopher Estimable PharmD 10:15 01/18/19 ([email or web address]) Performed at [facility], [place] [address], [place], [place] [address]    Culture STAPHYLOCOCCUS AUREUS (A)  Final   Report Status 01/20/2019 FINAL  Final   Organism ID, Bacteria STAPHYLOCOCCUS AUREUS  Final      Susceptibility   Staphylococcus aureus - MIC*    CIPROFLOXACIN <=0.5 SENSITIVE Sensitive     ERYTHROMYCIN <=0.25 SENSITIVE Sensitive     GENTAMICIN <=0.5 SENSITIVE Sensitive     OXACILLIN <=0.25 SENSITIVE Sensitive     TETRACYCLINE <=1 SENSITIVE Sensitive     VANCOMYCIN <=0.5 SENSITIVE Sensitive     TRIMETH/SULFA <=10 SENSITIVE Sensitive     CLINDAMYCIN <=0.25 SENSITIVE Sensitive     RIFAMPIN <=0.5 SENSITIVE Sensitive     Inducible Clindamycin NEGATIVE Sensitive     * STAPHYLOCOCCUS AUREUS  Blood Culture ID Panel (Reflexed)     Status: Abnormal   Collection Time: 01/17/19  9:08 PM  Result Value Ref Range Status   Enterococcus species NOT DETECTED NOT DETECTED Final   Listeria monocytogenes NOT DETECTED NOT DETECTED Final   Staphylococcus species DETECTED (A) NOT DETECTED Final    Comment: CRITICAL RESULT CALLED TO, READ BACK BY AND VERIFIED WITH: Cristopher Estimable PharmD 10:15 01/18/19 ([email or web address])    Staphylococcus aureus (BCID) DETECTED (A) NOT DETECTED Final    Comment: Methicillin (oxacillin) susceptible Staphylococcus aureus (MSSA). Preferred therapy is anti staphylococcal beta lactam antibiotic (Cefazolin or Nafcillin),  unless clinically contraindicated. CRITICAL RESULT CALLED TO, READ BACK BY AND VERIFIED WITH: Cristopher Estimable PharmD 10:15 01/18/19 ([email or web address])    Methicillin resistance NOT DETECTED NOT  DETECTED Final   Streptococcus species NOT DETECTED NOT DETECTED Final   Streptococcus agalactiae NOT DETECTED NOT DETECTED Final   Streptococcus pneumoniae NOT DETECTED NOT DETECTED Final   Streptococcus pyogenes NOT DETECTED NOT DETECTED Final   Acinetobacter baumannii NOT DETECTED NOT DETECTED Final   Enterobacteriaceae species NOT DETECTED NOT DETECTED Final   Enterobacter cloacae complex NOT DETECTED NOT DETECTED Final   Escherichia coli NOT DETECTED NOT DETECTED Final   Klebsiella oxytoca NOT DETECTED NOT DETECTED Final   Klebsiella pneumoniae NOT DETECTED NOT DETECTED Final   Proteus species NOT DETECTED NOT DETECTED Final   Serratia marcescens NOT DETECTED NOT DETECTED Final   Haemophilus influenzae NOT DETECTED NOT DETECTED Final   Neisseria meningitidis NOT DETECTED NOT DETECTED Final   Pseudomonas aeruginosa NOT DETECTED NOT DETECTED Final   Candida albicans NOT DETECTED NOT DETECTED Final   Candida glabrata NOT DETECTED NOT DETECTED Final   Candida krusei NOT DETECTED NOT DETECTED Final   Candida parapsilosis NOT DETECTED NOT DETECTED Final   Candida tropicalis NOT DETECTED NOT DETECTED Final    Comment: Performed at [facility], [address], [place], [place] [address]  SARS Coronavirus 2 (CEPHEID- Performed in [facility] lab), Hosp Order     Status: None   Collection Time: 01/18/19  2:02 AM   Specimen: Nasopharyngeal Swab  Result Value Ref Range Status   SARS Coronavirus 2 NEGATIVE NEGATIVE Final    Comment: (NOTE) If result is NEGATIVE SARS-CoV-2 target nucleic acids are NOT DETECTED. The SARS-CoV-2 RNA is generally detectable in upper and lower  respiratory specimens during the acute phase of infection. The lowest  concentration of SARS-CoV-2 viral copies this assay can detect is 250  copies / mL. A negative result does not preclude SARS-CoV-2 infection  and should not be used as the sole basis for treatment or other  patient management decisions.   A negative result may occur with  improper specimen collection / handling, submission of specimen other  than nasopharyngeal swab, presence of viral mutation(s) within the  areas targeted by this assay, and inadequate number of viral copies  (<250 copies / mL). A negative result must be combined with clinical  observations, patient history, and epidemiological information. If result is POSITIVE SARS-CoV-2 target nucleic acids are DETECTED. The SARS-CoV-2 RNA is generally detectable in upper and lower  respiratory specimens dur ing the acute phase of infection.  Positive  results are indicative of active infection with SARS-CoV-2.  Clinical  correlation with patient history and other diagnostic information is  necessary to determine patient infection status.  Positive results do  not rule out bacterial infection or co-infection with other viruses. If result is PRESUMPTIVE POSTIVE SARS-CoV-2 nucleic acids MAY BE PRESENT.   A presumptive positive result was obtained on the submitted specimen  and confirmed on repeat testing.  While 2019 novel coronavirus  (SARS-CoV-2) nucleic acids may be present in the submitted sample  additional confirmatory testing may be necessary for epidemiological  and / or clinical management purposes  to differentiate between  SARS-CoV-2 and other Sarbecovirus currently known to infect humans.  If clinically indicated additional testing with an alternate test  methodology [phone]) is advised. The SARS-CoV-2 RNA is generally  detectable in upper and lower respiratory sp ecimens during the acute  phase of infection. The  expected result is Negative. Fact Sheet for Patients:  StrictlyIdeas.no Fact Sheet for Healthcare Providers: BankingDealers.co.za This test is not yet approved or cleared by the [place] FDA and has been authorized for detection and/or diagnosis of SARS-CoV-2 by FDA under an Emergency Use  Authorization (EUA).  This EUA will remain in effect (meaning this test can be used) for the duration of the COVID-19 declaration under Section 564(b)(1) of the Act, 21 U.S.C. section 360bbb-3(b)(1), unless the authorization is terminated or revoked sooner. Performed at [facility], [place] [address] [place], [place] [address]   Culture, blood (routine x 2)     Status: None (Preliminary result)   Collection Time: 01/19/19  6:15 AM   Specimen: BLOOD  Result Value Ref Range Status   Specimen Description BLOOD RIGHT ANTECUBITAL  Final   Special Requests   Final    BOTTLES DRAWN AEROBIC AND ANAEROBIC Blood Culture adequate volume   Culture   Final    NO GROWTH 1 DAY Performed at [facility], [place] [address], [place], [place] [address]    Report Status PENDING  Incomplete  Culture, blood (routine x 2)     Status: None (Preliminary result)   Collection Time: 01/19/19  6:24 AM   Specimen: BLOOD RIGHT HAND  Result Value Ref Range Status   Specimen Description BLOOD RIGHT HAND  Final   Special Requests   Final    BOTTLES DRAWN AEROBIC AND ANAEROBIC Blood Culture adequate volume   Culture   Final    NO GROWTH 1 DAY Performed at [facility], [place] [address] [place], [place] [address]    Report Status PENDING  Incomplete  MRSA PCR Screening     Status: None   Collection Time: 01/20/19  7:07 AM   Specimen: Nasal Mucosa; Nasopharyngeal  Result Value Ref Range Status   MRSA by PCR NEGATIVE NEGATIVE Final    Comment:        The GeneXpert MRSA Assay (FDA approved for NASAL specimens only), is one component of a comprehensive MRSA colonization surveillance program. It is not intended to diagnose MRSA infection nor to guide or monitor treatment for MRSA infections. Performed at [facility], [place] [address] [place], [place] [address]      Time coordinating discharge: Over 30 minutes  SIGNED:   Darliss Cheney, MD  [facility] 01/21/2019, 3:30  PM Pager [phone]  If 7PM-7AM, please contact night-coverage [email or web address] Password [email or web address]

## 2019-01-21 NOTE — Progress Notes (Signed)
CRITICAL VALUE ALERT  Critical Value:  INR 4.5  Date & Time Notied:  01/21/2019 0515  Provider Notified: Kennon Holter  Orders Received/Actions taken: Awaiting orders

## 2019-01-21 NOTE — Progress Notes (Signed)
Cherlyn Cushing to be D/C'd Home  per MD order.  Discussed with the patient and all questions fully answered.  VSS, Skin clean, dry and intact without evidence of skin break down, no evidence of skin tears noted. IV catheter discontinued intact. Site without signs and symptoms of complications. Dressing and pressure applied.  An After Visit Summary was printed and given to the patient. Patient received prescription.  D/c education completed with patient/family including follow up instructions, medication list, d/c activities limitations if indicated, with other d/c instructions as indicated by MD - patient able to verbalize understanding, all questions fully answered.   Patient instructed to return to ED, call 911, or call MD for any changes in condition.   Patient escorted via [place], and D/C home via private auto.  Pt educated on holding her Coumadin tonight and resume  The dose tomorrow as order by MD. [place] 01/21/2019 5:00 PM

## 2019-01-23 ENCOUNTER — Telehealth: Payer: Self-pay | Admitting: *Deleted

## 2019-01-23 ENCOUNTER — Encounter (HOSPITAL_COMMUNITY): Payer: Self-pay | Admitting: Cardiology

## 2019-01-23 ENCOUNTER — Ambulatory Visit (INDEPENDENT_AMBULATORY_CARE_PROVIDER_SITE_OTHER): Payer: 59

## 2019-01-23 DIAGNOSIS — Z7901 Long term (current) use of anticoagulants: Secondary | ICD-10-CM

## 2019-01-23 DIAGNOSIS — Z952 Presence of prosthetic heart valve: Secondary | ICD-10-CM

## 2019-01-23 DIAGNOSIS — Z8673 Personal history of transient ischemic attack (TIA), and cerebral infarction without residual deficits: Secondary | ICD-10-CM | POA: Diagnosis not present

## 2019-01-23 DIAGNOSIS — E876 Hypokalemia: Secondary | ICD-10-CM

## 2019-01-23 LAB — POCT INR: INR: 8 — AB (ref 2.0–3.0)

## 2019-01-23 MED ORDER — POTASSIUM CHLORIDE CRYS ER 20 MEQ PO TBCR: 20.0000 meq | EXTENDED_RELEASE_TABLET | Freq: Every day | ORAL | 2 refills | Status: DC

## 2019-01-23 NOTE — Telephone Encounter (Signed)
Labs on pt given to [place] by Coumadin clinic RN, showing low K level of 3.2.  Lab shown to Dr Meda Coffee who ordered for the pt to take KDUR 20 mEq po daily and come back in 2 weeks for a BMET.  Confirmed the pharmacy of choice with the pt.  Pt is scheduled for a repeat BMET in 2 weeks on 02/08/19 at 1:30 pm.  Note placed MUST KEEP LAB APPT.  Pt verbalized understanding and agrees with this plan.

## 2019-01-24 ENCOUNTER — Telehealth: Payer: Self-pay | Admitting: *Deleted

## 2019-01-24 LAB — CULTURE, BLOOD (ROUTINE X 2)
Culture: NO GROWTH
Culture: NO GROWTH
Special Requests: ADEQUATE
Special Requests: ADEQUATE

## 2019-01-24 LAB — PROTIME-INR: INR: 7.2 — AB (ref 0.9–1.1)

## 2019-01-24 NOTE — Telephone Encounter (Signed)
Receive a lab report with a BMET and CBC but NO INR result. Pt was to have a STAT INR on 01/23/2019 but still no results. Called [facility] RN and asked her if the lab had been drawn and she stated that she drew it with the other labs yesterday. Made her aware that the labs are not on the faxed report and she stated she would call the [facility] to inquire as we are still awaiting a result. Will await a call back from Annasia.

## 2019-01-24 NOTE — Patient Instructions (Signed)
Description   Spoke with Jesse Fall, [facility] RN while in pt's home, advised to have pt hold Coumadin.  Go to the ED with bleeding.  Gave verbal order to draw STAT INR, will await results for further dosing instructions. 7/14@251pm  [facility] called with updated lab results INR 7.2 orders given to hold Coumadin 7/13, 7/14, 7/15, 7/16 and recheck INR on 01/26/2019.  Normal Dose: 1 tablet daily except 1.5 tablets on Mondays and Fridays.  Recheck INR .  Call coumadin clinic with any new medications scheduled for any procedures or with any questions [phone].

## 2019-01-24 NOTE — Telephone Encounter (Signed)
At 251pm, Elizabeth Morton called back with INR results and stated that she had to call [facility] and obtain results. She was very apologetic for the results taking so long but stated she had to speak with the district manager at [facility] regarding this as the labs did not get processed in a timely manner with the other labs that were processed (CBC, BMET).  She reported INR of 7.2 and then the INR was faxed over and resulted at 7.2; gave her orders to have Elizabeth Morton continue to hold INR and recheck INR on Thursday to ensure INR is decreasing. Also, called the Elizabeth Morton to inform and gave her instructions to continue holding Warfarin and that [facility]asia RN will recheck INR on Thursday.

## 2019-01-26 ENCOUNTER — Ambulatory Visit (INDEPENDENT_AMBULATORY_CARE_PROVIDER_SITE_OTHER): Payer: 59 | Admitting: Pharmacist

## 2019-01-26 DIAGNOSIS — Z8673 Personal history of transient ischemic attack (TIA), and cerebral infarction without residual deficits: Secondary | ICD-10-CM

## 2019-01-26 DIAGNOSIS — Z7901 Long term (current) use of anticoagulants: Secondary | ICD-10-CM | POA: Diagnosis not present

## 2019-01-26 DIAGNOSIS — Z952 Presence of prosthetic heart valve: Secondary | ICD-10-CM

## 2019-01-26 LAB — POCT INR: INR: 2.3 (ref 2.0–3.0)

## 2019-01-30 ENCOUNTER — Telehealth: Payer: Self-pay

## 2019-01-30 NOTE — Telephone Encounter (Signed)
This encounter was created in error - please disregard.

## 2019-01-30 NOTE — Telephone Encounter (Signed)
Called to screen for covid but the pt stated that they are getting hh from [facility] gave the info to [facility] for her to get orders for coumadin checks for [facility] to do them and report to the clinic the pt was advised to keep appt until certain ahc can start them. 1. COVID-19 Pre-Screening Questions:  . In the past 7 to 10 days have you had a cough,  shortness of breath, headache, congestion, fever (100 or greater) body aches, chills, sore throat, or sudden loss of taste or sense of smell? no . Have you been around anyone with known Covid 19.  no . Have you been around anyone who is awaiting Covid 19 test results in the past 7 to 10 days?  no . Have you been around anyone who has been exposed to Covid 19, or has mentioned symptoms of Covid 19 within the past 7 to 10 days?  no    2. Pt advised of visitor restrictions (no visitors allowed except if needed to conduct the visit). Also advised to arrive at appointment time and wear a mask.

## 2019-01-31 ENCOUNTER — Ambulatory Visit (INDEPENDENT_AMBULATORY_CARE_PROVIDER_SITE_OTHER): Payer: 59 | Admitting: Interventional Cardiology

## 2019-01-31 DIAGNOSIS — Z7901 Long term (current) use of anticoagulants: Secondary | ICD-10-CM

## 2019-01-31 LAB — POCT INR: INR: 4.2 — AB (ref 2.0–3.0)

## 2019-01-31 NOTE — Patient Instructions (Signed)
Description   Spoke with [facility] while she is at pt's house, instructed for pt to hold coumadin for today and tomorrow, then start taking 1 tablet daily. Recheck INR on 02/06/2019. Call coumadin clinic with any new medications, scheduled for any procedures or with any questions [phone].

## 2019-02-06 LAB — PROTIME-INR: INR: 2.9 — AB (ref 0.9–1.1)

## 2019-02-07 ENCOUNTER — Ambulatory Visit (INDEPENDENT_AMBULATORY_CARE_PROVIDER_SITE_OTHER): Payer: 59 | Admitting: Cardiology

## 2019-02-07 DIAGNOSIS — Z7901 Long term (current) use of anticoagulants: Secondary | ICD-10-CM | POA: Diagnosis not present

## 2019-02-07 DIAGNOSIS — Z8673 Personal history of transient ischemic attack (TIA), and cerebral infarction without residual deficits: Secondary | ICD-10-CM | POA: Diagnosis not present

## 2019-02-07 DIAGNOSIS — Z952 Presence of prosthetic heart valve: Secondary | ICD-10-CM | POA: Diagnosis not present

## 2019-02-07 NOTE — Patient Instructions (Signed)
Description   Spoke with [facility] instructed for pt to continue taking 1 tablet daily. Recheck INR in 1 week. Call coumadin clinic with any new medications, scheduled for any procedures or with any questions [phone].

## 2019-02-08 ENCOUNTER — Telehealth: Payer: Self-pay | Admitting: Internal Medicine

## 2019-02-08 ENCOUNTER — Other Ambulatory Visit: Payer: 59

## 2019-02-08 NOTE — Telephone Encounter (Signed)
COVID-19 Pre-Screening Questions: ° °Do you currently have a fever (>100 °F), chills or unexplained body aches? N ° °Are you currently experiencing new cough, shortness of breath, sore throat, runny nose? N °•  °Have you recently travelled outside the state of [place] in the last 14 days? N  °•  °Have you been in contact with someone that is currently pending confirmation of Covid19 testing or has been confirmed to have the Covid19 virus?  N ° °**If the patient answers NO to ALL questions -  advise the patient to please call the clinic before coming to the office should any symptoms develop.  ° ° ° °

## 2019-02-09 ENCOUNTER — Encounter: Payer: Self-pay | Admitting: Internal Medicine

## 2019-02-09 ENCOUNTER — Ambulatory Visit (INDEPENDENT_AMBULATORY_CARE_PROVIDER_SITE_OTHER): Payer: 59 | Admitting: Internal Medicine

## 2019-02-09 ENCOUNTER — Other Ambulatory Visit: Payer: Self-pay

## 2019-02-09 DIAGNOSIS — R7881 Bacteremia: Secondary | ICD-10-CM

## 2019-02-09 MED ORDER — CEFAZOLIN IV (FOR PTA / DISCHARGE USE ONLY): 2.0000 g | Freq: Three times a day (TID) | INTRAVENOUS | Status: AC

## 2019-02-09 NOTE — Progress Notes (Signed)
[facility] for Infectious Disease  Patient Active Problem List   Diagnosis Date Noted  . Sepsis ([place]) 01/18/2019    Priority: High  . Bacteremia due to methicillin susceptible Staphylococcus aureus (MSSA) 01/18/2019    Priority: High  . S/P aortic valve replacement     Priority: Medium  . Aortic stenosis     Priority: Medium  . Acute kidney injury ([place]) 01/18/2019  . Thrombocytopenia ([place]) 01/18/2019  . Chronic hepatitis C without hepatic coma ([place]) 05/04/2017  . S/P repair of ventral hernia Nov 2017 05/15/2016  . Ventral hernia 05/15/2016  . Chronic congestive heart failure with left ventricular diastolic dysfunction ([place]) 06/25/2015  . Hypothyroidism 06/25/2015  . Long term (current) use of anticoagulants 06/02/2012  . CAD (coronary artery disease)   . Chronic cholecystitis with calculus 05/25/2012  . Constipation, chronic 05/25/2012  . Needs screening Colonoscopy - Pt refused 05/25/2012  . Warfarin anticoagulation   . Hypertension   . Hx of CABG   . History of CVA (cerebrovascular accident)   . Dyslipidemia   . Shortness of breath 04/16/2010    Patient's Medications  New Prescriptions   No medications on file  Previous Medications   ACETAMINOPHEN (TYLENOL) 500 MG TABLET    Take 500-1,000 mg by mouth every 6 (six) hours as needed (for pain or headaches).    CALCIUM CARBONATE-VITAMIN D (CALCIUM 600+D) 600-400 MG-UNIT PER TABLET    Take 1 tablet by mouth 2 (two) times daily.   FUROSEMIDE (LASIX) 20 MG TABLET    Take 20 mg by mouth daily.   LEVOTHYROXINE (SYNTHROID, LEVOTHROID) 50 MCG TABLET    Take 50 mcg by mouth daily before breakfast.   MULTIPLE VITAMINS-MINERALS (PRESERVISION/LUTEIN PO)    Take 1 capsule by mouth daily with breakfast.    POTASSIUM CHLORIDE SA (K-DUR) 20 MEQ TABLET    Take 1 tablet (20 mEq total) by mouth daily.   SIMVASTATIN (ZOCOR) 40 MG TABLET    Take 1 tablet (40 mg total) by mouth at bedtime.   TRIAZOLAM (HALCION) 0.25 MG TABLET     Take 0.25 mg by mouth at bedtime.    WARFARIN (COUMADIN) 3 MG TABLET    TAKE 1 TABLET (3 MG TOTAL) BY MOUTH AS DIRECTED.  Modified Medications   Modified Medication Previous Medication   CEFAZOLIN (ANCEF) IVPB ceFAZolin (ANCEF) IVPB      Inject 2 g into the vein every 8 (eight) hours for 5 days. Indication:  MSSA bacteremia Last Day of Therapy:  02/14/2019 Labs - Once weekly:  CBC/D and BMP, Labs - Every other week:  ESR and CRP    Inject 2 g into the vein every 8 (eight) hours for 26 days. Indication:  MSSA bacteremia Last Day of Therapy:  02/14/2019 Labs - Once weekly:  CBC/D and BMP, Labs - Every other week:  ESR and CRP  Discontinued Medications   No medications on file    Subjective: Elizabeth Morton is in for her routine hospital follow-up visit.  She became acutely ill in early July. She recalls having some transient nausea, vomiting and diarrhea.  She began to have fever and developed confusion  leading to admission on 01/17/2019.  Her temperature was 103.7 degrees.  She was started on broad empiric antibiotic therapy for sepsis.  Both admission blood cultures grew MSSA.  No acute abnormalities were seen on chest x-ray, CT scan of the brain, abdomen and pelvis and MRI of the thoracolumbar lumbar  spine.  She had no pyuria on urinalysis.  She has a history of coronary artery disease and aortic stenosis.  She underwent CABG and aortic valve replacement with a mechanical valve in 2006.  She is on chronic anticoagulation.  She was treated for chronic hepatitis C and cured last year.  There was no evidence of endocarditis by exam or TEE.  Repeat blood cultures became negative within 36 hours.  She was discharged on IV cefazolin.  She has not had any problems tolerating cefazolin or her PICC she is feeling much better she has now pleaded 3-1/2 weeks her planned 4-week course of antibiotics.  She is feeling much better.   Review of Systems: Review of Systems  Constitutional: Positive for  malaise/fatigue. Negative for fever.  Respiratory: Negative for cough and shortness of breath.   Cardiovascular: Negative for chest pain.  Gastrointestinal: Negative for abdominal pain, diarrhea, nausea and vomiting.  Musculoskeletal: Negative for back pain.    Past Medical History:  Diagnosis Date  . Aortic stenosis    Aortic valve replacement 2006  . Bacteremia 01/19/2019  . CAD (coronary artery disease)    a. s/p CABG 2006 at time of AVR.  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 Dyslipidemia   . Gallstones   . GERD (gastroesophageal reflux disease)   . Heart murmur   . Hx of CABG    2006,LIMA to LAD, SVG to diagonal  . Hx of transfusion of packed red blood cells   . Hypertension   . Hypothyroidism   . Iron deficiency anemia   . Lower GI bleed 06/2015  . Pericardial effusion    Insignificant small pericardial effusion seen on echo, November, 2013  . Peripheral vascular disease ([place]) 06   blood clot -stroke  . S/P aortic valve replacement    a. Bentall procedure,#21 St. Jude mechanical valve conduit with reimplantation of the coronaries 2006  . Status post colonoscopy with polypectomy    "bleeding; colonoscopy was ~ 06/18/2015"  . Stroke [facility]) 2006   no deficits   . Warfarin anticoagulation    coumadin therapy    Social History   Tobacco Use  . Smoking status: Former Smoker    Packs/day: 1.00    Years: 25.00    Pack years: 25.00    Types: Cigarettes    Quit date: 07/15/2004    Years since quitting: 14.5  . Smokeless tobacco: Never Used  Substance Use Topics  . Alcohol use: No  . Drug use: No    Family History  Problem Relation Age of Onset  . Stroke Mother   . Heart disease Mother   . Alcohol abuse Father   . Hypertension Father   . Stroke Father   . Hyperlipidemia Father   . Stroke Brother        she has three and at least one of them has had a stroke  . Colon cancer Neg Hx     No Known Allergies  Objective:  Vitals:   02/09/19 1108  BP: (!) 144/74  Pulse: 91  Temp: 98 F (36.7 C)   There is no height or weight on file to calculate BMI.  Physical Exam Constitutional:      Comments: She is looking much better.  She is alert and in good spirits.  Cardiovascular:     Rate and Rhythm: Normal rate and regular rhythm.     Heart sounds: Murmur present.  Comments: She has crisp mechanical valve sounds and an early 1 over systolic murmur heard best at the left upper sternal border.  This is unchanged. Pulmonary:     Effort: Pulmonary effort is normal.     Breath sounds: Normal breath sounds.  Skin:    Comments: Her right arm PICC site looks good.  Psychiatric:        Mood and Affect: Mood normal.     Lab Results    Problem List Items Addressed This Visit      High   Bacteremia due to methicillin susceptible Staphylococcus aureus (MSSA)    She is improving on therapy for MSSA bacteremia.  She will complete therapy on 02/14/2019 and have her PICC removed.  She will follow-up here in 6 weeks.          Michel Bickers, MD [facility] for [facility] [place] [facility] [phone] pager   [phone] cell 02/09/2019, 11:31 AM

## 2019-02-09 NOTE — Progress Notes (Signed)
Called [facility] infusion and gave Coretta verbal orders per Dr. Megan Salon pull picc after therapy on 02/14/19. Verbal orders read back and understood.  Patient made aware during office visit today. Eugenia Mcalpine, LPN

## 2019-02-09 NOTE — Assessment & Plan Note (Signed)
She is improving on therapy for MSSA bacteremia.  She will complete therapy on 02/14/2019 and have her PICC removed.  She will follow-up here in 6 weeks.

## 2019-02-13 LAB — POCT INR: INR: 3.1 — AB (ref 2.0–3.0)

## 2019-02-14 ENCOUNTER — Ambulatory Visit (INDEPENDENT_AMBULATORY_CARE_PROVIDER_SITE_OTHER): Payer: 59

## 2019-02-14 DIAGNOSIS — Z8673 Personal history of transient ischemic attack (TIA), and cerebral infarction without residual deficits: Secondary | ICD-10-CM | POA: Diagnosis not present

## 2019-02-14 DIAGNOSIS — Z952 Presence of prosthetic heart valve: Secondary | ICD-10-CM | POA: Diagnosis not present

## 2019-02-14 DIAGNOSIS — Z7901 Long term (current) use of anticoagulants: Secondary | ICD-10-CM | POA: Diagnosis not present

## 2019-02-14 NOTE — Patient Instructions (Signed)
Description   Spoke with Anasia HHRN instructed for pt to take 1/2 tablet today, then resume same dosage 1 tablet daily. Recheck INR in 1 week. PICC line being pulled on Thursday 02/16/19, will check next week may be last [facility] visit. Call coumadin clinic with any new medications, scheduled for any procedures or with any questions [phone].

## 2019-02-23 ENCOUNTER — Telehealth: Payer: Self-pay | Admitting: Pharmacist

## 2019-02-23 NOTE — Telephone Encounter (Signed)
Called patient to schedule appointment in coumadin clinic. Person who answered the phone stated she was asleep. Left message for her to call coumadin clinic back.

## 2019-02-23 NOTE — Telephone Encounter (Signed)
INR appt scheduled.

## 2019-02-23 NOTE — Telephone Encounter (Signed)
Annashia from [facility] called to state that patient has been discharged from home health. Will need appointment in coumadin clinic.

## 2019-03-15 ENCOUNTER — Other Ambulatory Visit: Payer: Self-pay

## 2019-03-15 ENCOUNTER — Ambulatory Visit (INDEPENDENT_AMBULATORY_CARE_PROVIDER_SITE_OTHER): Payer: 59

## 2019-03-15 DIAGNOSIS — Z952 Presence of prosthetic heart valve: Secondary | ICD-10-CM | POA: Diagnosis not present

## 2019-03-15 DIAGNOSIS — Z5181 Encounter for therapeutic drug level monitoring: Secondary | ICD-10-CM

## 2019-03-15 DIAGNOSIS — Z8673 Personal history of transient ischemic attack (TIA), and cerebral infarction without residual deficits: Secondary | ICD-10-CM | POA: Diagnosis not present

## 2019-03-15 DIAGNOSIS — Z7901 Long term (current) use of anticoagulants: Secondary | ICD-10-CM

## 2019-03-15 LAB — POCT INR: INR: 3.9 — AB (ref 2.0–3.0)

## 2019-03-15 NOTE — Patient Instructions (Signed)
Description   Skip today's dosage of Coumadin, then start taking 1 tablet daily except 1/2 tablet on Mondays. Recheck INR in 2-3 weeks.  Call coumadin clinic with any new medications, scheduled for any procedures or with any questions [phone].

## 2019-03-29 ENCOUNTER — Encounter: Payer: Self-pay | Admitting: Internal Medicine

## 2019-03-29 ENCOUNTER — Other Ambulatory Visit: Payer: Self-pay

## 2019-03-29 ENCOUNTER — Ambulatory Visit (INDEPENDENT_AMBULATORY_CARE_PROVIDER_SITE_OTHER): Payer: 59 | Admitting: Internal Medicine

## 2019-03-29 DIAGNOSIS — R7881 Bacteremia: Secondary | ICD-10-CM | POA: Diagnosis not present

## 2019-03-29 NOTE — Assessment & Plan Note (Signed)
I believe her MSSA bacteremia has been cured.  She can follow-up here as needed.

## 2019-03-29 NOTE — Progress Notes (Signed)
[place] for Infectious Disease  Patient Active Problem List   Diagnosis Date Noted  . Sepsis ([place]) 01/18/2019    Priority: High  . Bacteremia due to methicillin susceptible Staphylococcus aureus (MSSA) 01/18/2019    Priority: High  . S/P aortic valve replacement     Priority: Medium  . Aortic stenosis     Priority: Medium  . Acute kidney injury ([place]) 01/18/2019  . Thrombocytopenia ([place]) 01/18/2019  . Chronic hepatitis C without hepatic coma ([place]) 05/04/2017  . S/P repair of ventral hernia Nov 2017 05/15/2016  . Ventral hernia 05/15/2016  . Chronic congestive heart failure with left ventricular diastolic dysfunction ([place]) 06/25/2015  . Hypothyroidism 06/25/2015  . Long term (current) use of anticoagulants 06/02/2012  . CAD (coronary artery disease)   . Chronic cholecystitis with calculus 05/25/2012  . Constipation, chronic 05/25/2012  . Needs screening Colonoscopy - Pt refused 05/25/2012  . Warfarin anticoagulation   . Hypertension   . Hx of CABG   . History of CVA (cerebrovascular accident)   . Dyslipidemia   . Shortness of breath 04/16/2010    Patient's Medications  New Prescriptions   No medications on file  Previous Medications   ACETAMINOPHEN (TYLENOL) 500 MG TABLET    Take 500-1,000 mg by mouth every 6 (six) hours as needed (for pain or headaches).    CALCIUM CARBONATE-VITAMIN D (CALCIUM 600+D) 600-400 MG-UNIT PER TABLET    Take 1 tablet by mouth 2 (two) times daily.   FUROSEMIDE (LASIX) 20 MG TABLET    Take 20 mg by mouth daily.   LEVOTHYROXINE (SYNTHROID, LEVOTHROID) 50 MCG TABLET    Take 50 mcg by mouth daily before breakfast.   MULTIPLE VITAMINS-MINERALS (PRESERVISION/LUTEIN PO)    Take 1 capsule by mouth daily with breakfast.    POTASSIUM CHLORIDE SA (K-DUR) 20 MEQ TABLET    Take 1 tablet (20 mEq total) by mouth daily.   SIMVASTATIN (ZOCOR) 40 MG TABLET    Take 1 tablet (40 mg total) by mouth at bedtime.   TRIAZOLAM (HALCION) 0.25 MG TABLET     Take 0.25 mg by mouth at bedtime.    WARFARIN (COUMADIN) 3 MG TABLET    TAKE 1 TABLET (3 MG TOTAL) BY MOUTH AS DIRECTED.  Modified Medications   No medications on file  Discontinued Medications   No medications on file    Subjective: Elizabeth Morton is in for her routine follow-up visit.  She became acutely ill in early July. She recalls having some transient nausea, vomiting and diarrhea.  She began to have fever and developed confusion  leading to admission on 01/17/2019.  Her temperature was 103.7 degrees.  She was started on broad empiric antibiotic therapy for sepsis.  Both admission blood cultures grew MSSA.  No acute abnormalities were seen on chest x-ray, CT scan of the brain, abdomen and pelvis and MRI of the thoracolumbar lumbar spine.  She had no pyuria on urinalysis.  She has a history of coronary artery disease and aortic stenosis.  She underwent CABG and aortic valve replacement with a mechanical valve in 2006.  She is on chronic anticoagulation.  She was treated for chronic hepatitis C and cured last year.  There was no evidence of endocarditis by exam or TEE.  Repeat blood cultures became negative within 36 hours.  She was discharged on IV cefazolin.  She completed 4 weeks of therapy on 02/14/2019.  Her PICC was removed.  She is feeling  much better.  Review of Systems: Review of Systems  Constitutional: Positive for malaise/fatigue. Negative for fever.  Respiratory: Negative for cough and shortness of breath.   Cardiovascular: Negative for chest pain.  Gastrointestinal: Negative for abdominal pain, diarrhea, nausea and vomiting.  Musculoskeletal: Negative for back pain.    Past Medical History:  Diagnosis Date  . Aortic stenosis    Aortic valve replacement 2006  . Bacteremia 01/19/2019  . CAD (coronary artery disease)    a. s/p CABG 2006 at time of AVR.  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 Dyslipidemia   . Gallstones   . GERD  (gastroesophageal reflux disease)   . Heart murmur   . Hx of CABG    2006,LIMA to LAD, SVG to diagonal  . Hx of transfusion of packed red blood cells   . Hypertension   . Hypothyroidism   . Iron deficiency anemia   . Lower GI bleed 06/2015  . Pericardial effusion    Insignificant small pericardial effusion seen on echo, November, 2013  . Peripheral vascular disease ([place]) 06   blood clot -stroke  . S/P aortic valve replacement    a. Bentall procedure,#21 St. Jude mechanical valve conduit with reimplantation of the coronaries 2006  . Status post colonoscopy with polypectomy    "bleeding; colonoscopy was ~ 06/18/2015"  . Stroke [facility]) 2006   no deficits   . Warfarin anticoagulation    coumadin therapy    Social History   Tobacco Use  . Smoking status: Former Smoker    Packs/day: 1.00    Years: 25.00    Pack years: 25.00    Types: Cigarettes    Quit date: 07/15/2004    Years since quitting: 14.7  . Smokeless tobacco: Never Used  Substance Use Topics  . Alcohol use: No  . Drug use: No    Family History  Problem Relation Age of Onset  . Stroke Mother   . Heart disease Mother   . Alcohol abuse Father   . Hypertension Father   . Stroke Father   . Hyperlipidemia Father   . Stroke Brother        she has three and at least one of them has had a stroke  . Colon cancer Neg Hx     No Known Allergies  Objective: Vitals:   03/29/19 1115  BP: 124/77  Pulse: 94  Temp: 99 F (37.2 C)   There is no height or weight on file to calculate BMI.  Physical Exam Constitutional:      Comments: She is looking better.    Cardiovascular:     Rate and Rhythm: Normal rate and regular rhythm.     Heart sounds: Murmur present.     Comments: She has crisp mechanical valve sounds and an early 1 over systolic murmur heard best at the left upper sternal border.  This is unchanged. Pulmonary:     Effort: Pulmonary effort is normal.     Breath sounds: Normal breath sounds.  Psychiatric:         Mood and Affect: Mood normal.     Lab Results    Problem List Items Addressed This Visit      High   Bacteremia due to methicillin susceptible Staphylococcus aureus (MSSA)    I believe her MSSA bacteremia has been cured.  She can follow-up here as needed.          Michel Bickers, MD [facility] [place] [facility] [phone] pager   [phone] cell 03/29/2019, 11:34 AM

## 2019-04-05 ENCOUNTER — Ambulatory Visit (INDEPENDENT_AMBULATORY_CARE_PROVIDER_SITE_OTHER): Payer: 59 | Admitting: Pharmacist

## 2019-04-05 ENCOUNTER — Other Ambulatory Visit: Payer: Self-pay

## 2019-04-05 DIAGNOSIS — Z8673 Personal history of transient ischemic attack (TIA), and cerebral infarction without residual deficits: Secondary | ICD-10-CM

## 2019-04-05 DIAGNOSIS — Z952 Presence of prosthetic heart valve: Secondary | ICD-10-CM | POA: Diagnosis not present

## 2019-04-05 DIAGNOSIS — Z5181 Encounter for therapeutic drug level monitoring: Secondary | ICD-10-CM

## 2019-04-05 DIAGNOSIS — Z7901 Long term (current) use of anticoagulants: Secondary | ICD-10-CM | POA: Diagnosis not present

## 2019-04-05 LAB — POCT INR: INR: 2.7 (ref 2.0–3.0)

## 2019-04-05 NOTE — Patient Instructions (Signed)
Continue taking 1 tablet daily except 1/2 tablet on Mondays. Recheck INR in 4 weeks.  Call coumadin clinic with any new medications, scheduled for any procedures or with any questions [phone].

## 2019-04-12 ENCOUNTER — Other Ambulatory Visit (HOSPITAL_COMMUNITY)
Admission: RE | Admit: 2019-04-12 | Discharge: 2019-04-12 | Disposition: A | Discharge status: Home / Self Care | Payer: 59 | Source: Ambulatory Visit | Attending: Internal Medicine | Admitting: Internal Medicine

## 2019-04-12 ENCOUNTER — Other Ambulatory Visit: Payer: Self-pay | Admitting: Internal Medicine

## 2019-04-12 DIAGNOSIS — Z01419 Encounter for gynecological examination (general) (routine) without abnormal findings: Secondary | ICD-10-CM | POA: Diagnosis present

## 2019-04-14 ENCOUNTER — Other Ambulatory Visit: Payer: Self-pay | Admitting: Internal Medicine

## 2019-04-14 DIAGNOSIS — M810 Age-related osteoporosis without current pathological fracture: Secondary | ICD-10-CM

## 2019-04-14 LAB — CYTOLOGY - PAP: Diagnosis: NEGATIVE

## 2019-04-24 ENCOUNTER — Other Ambulatory Visit: Payer: Self-pay | Admitting: Internal Medicine

## 2019-04-24 DIAGNOSIS — Z1231 Encounter for screening mammogram for malignant neoplasm of breast: Secondary | ICD-10-CM

## 2019-05-03 ENCOUNTER — Other Ambulatory Visit: Payer: Self-pay

## 2019-05-03 ENCOUNTER — Ambulatory Visit (INDEPENDENT_AMBULATORY_CARE_PROVIDER_SITE_OTHER): Payer: 59

## 2019-05-03 DIAGNOSIS — Z952 Presence of prosthetic heart valve: Secondary | ICD-10-CM

## 2019-05-03 DIAGNOSIS — Z8673 Personal history of transient ischemic attack (TIA), and cerebral infarction without residual deficits: Secondary | ICD-10-CM

## 2019-05-03 DIAGNOSIS — Z7901 Long term (current) use of anticoagulants: Secondary | ICD-10-CM | POA: Diagnosis not present

## 2019-05-03 DIAGNOSIS — Z5181 Encounter for therapeutic drug level monitoring: Secondary | ICD-10-CM | POA: Diagnosis not present

## 2019-05-03 LAB — POCT INR: INR: 3.1 — AB (ref 2.0–3.0)

## 2019-05-03 NOTE — Patient Instructions (Signed)
Description   Continue taking 1 tablet daily except 1/2 tablet on Mondays. Eat your cole slaw tonight for dinner as planned and continue weekly.  Recheck INR in 4 weeks.  Call coumadin clinic with any new medications, scheduled for any procedures or with any questions [phone].

## 2019-05-13 ENCOUNTER — Other Ambulatory Visit: Payer: Self-pay | Admitting: Cardiology

## 2019-05-13 DIAGNOSIS — E876 Hypokalemia: Secondary | ICD-10-CM

## 2019-05-25 ENCOUNTER — Other Ambulatory Visit: Payer: Self-pay | Admitting: Cardiology

## 2019-05-31 ENCOUNTER — Other Ambulatory Visit: Payer: Self-pay

## 2019-05-31 ENCOUNTER — Ambulatory Visit (INDEPENDENT_AMBULATORY_CARE_PROVIDER_SITE_OTHER): Payer: 59 | Admitting: Pharmacist

## 2019-05-31 DIAGNOSIS — Z5181 Encounter for therapeutic drug level monitoring: Secondary | ICD-10-CM | POA: Diagnosis not present

## 2019-05-31 DIAGNOSIS — Z8673 Personal history of transient ischemic attack (TIA), and cerebral infarction without residual deficits: Secondary | ICD-10-CM | POA: Diagnosis not present

## 2019-05-31 DIAGNOSIS — Z952 Presence of prosthetic heart valve: Secondary | ICD-10-CM | POA: Diagnosis not present

## 2019-05-31 DIAGNOSIS — Z7901 Long term (current) use of anticoagulants: Secondary | ICD-10-CM

## 2019-05-31 LAB — POCT INR: INR: 3.2 — AB (ref 2.0–3.0)

## 2019-05-31 NOTE — Patient Instructions (Signed)
Description   Take 1/2 tablet today, then continue taking 1 tablet daily except 1/2 tablet on Mondays. Eat your cole slaw tonight for dinner as planned and continue weekly.  Recheck INR in 4 weeks.  Call coumadin clinic with any new medications, scheduled for any procedures or with any questions [phone].

## 2019-06-30 ENCOUNTER — Other Ambulatory Visit: Payer: Self-pay

## 2019-06-30 ENCOUNTER — Ambulatory Visit (INDEPENDENT_AMBULATORY_CARE_PROVIDER_SITE_OTHER): Payer: 59 | Admitting: Pharmacist

## 2019-06-30 DIAGNOSIS — Z952 Presence of prosthetic heart valve: Secondary | ICD-10-CM

## 2019-06-30 DIAGNOSIS — Z7901 Long term (current) use of anticoagulants: Secondary | ICD-10-CM | POA: Diagnosis not present

## 2019-06-30 DIAGNOSIS — Z5181 Encounter for therapeutic drug level monitoring: Secondary | ICD-10-CM

## 2019-06-30 DIAGNOSIS — Z8673 Personal history of transient ischemic attack (TIA), and cerebral infarction without residual deficits: Secondary | ICD-10-CM

## 2019-06-30 LAB — POCT INR: INR: 2.1 (ref 2.0–3.0)

## 2019-06-30 NOTE — Patient Instructions (Signed)
Description   Continue taking 1 tablet daily except 1/2 tablet on Mondays. Eat your cole slaw tonight for dinner as planned and continue weekly.  Recheck INR in 5 weeks.  Call coumadin clinic with any new medications, scheduled for any procedures or with any questions [phone].

## 2019-07-13 ENCOUNTER — Ambulatory Visit
Admission: RE | Admit: 2019-07-13 | Discharge: 2019-07-13 | Disposition: A | Discharge status: Home / Self Care | Payer: 59 | Source: Ambulatory Visit | Attending: Internal Medicine | Admitting: Internal Medicine

## 2019-07-13 ENCOUNTER — Other Ambulatory Visit: Payer: Self-pay

## 2019-07-13 DIAGNOSIS — M810 Age-related osteoporosis without current pathological fracture: Secondary | ICD-10-CM

## 2019-07-13 DIAGNOSIS — Z1231 Encounter for screening mammogram for malignant neoplasm of breast: Secondary | ICD-10-CM

## 2019-08-04 ENCOUNTER — Other Ambulatory Visit: Payer: Self-pay

## 2019-08-04 ENCOUNTER — Encounter (INDEPENDENT_AMBULATORY_CARE_PROVIDER_SITE_OTHER): Payer: Self-pay

## 2019-08-04 ENCOUNTER — Ambulatory Visit (INDEPENDENT_AMBULATORY_CARE_PROVIDER_SITE_OTHER): Payer: 59 | Admitting: *Deleted

## 2019-08-04 DIAGNOSIS — Z7901 Long term (current) use of anticoagulants: Secondary | ICD-10-CM | POA: Diagnosis not present

## 2019-08-04 DIAGNOSIS — Z8673 Personal history of transient ischemic attack (TIA), and cerebral infarction without residual deficits: Secondary | ICD-10-CM

## 2019-08-04 DIAGNOSIS — Z952 Presence of prosthetic heart valve: Secondary | ICD-10-CM

## 2019-08-04 DIAGNOSIS — Z5181 Encounter for therapeutic drug level monitoring: Secondary | ICD-10-CM | POA: Diagnosis not present

## 2019-08-04 LAB — POCT INR: INR: 2.3 (ref 2.0–3.0)

## 2019-08-04 NOTE — Patient Instructions (Signed)
Description   Continue taking 1 tablet daily except 1/2 tablet on Mondays. Eat your cole slaw tonight for dinner as planned and continue weekly.  Recheck INR in 6 weeks.  Call coumadin clinic with any new medications, scheduled for any procedures or with any questions [phone].

## 2019-08-12 ENCOUNTER — Other Ambulatory Visit: Payer: Self-pay | Admitting: Cardiology

## 2019-08-12 DIAGNOSIS — E876 Hypokalemia: Secondary | ICD-10-CM

## 2019-09-06 DIAGNOSIS — M810 Age-related osteoporosis without current pathological fracture: Secondary | ICD-10-CM | POA: Insufficient documentation

## 2019-09-06 DIAGNOSIS — I2510 Atherosclerotic heart disease of native coronary artery without angina pectoris: Secondary | ICD-10-CM | POA: Insufficient documentation

## 2019-09-06 DIAGNOSIS — I69959 Hemiplegia and hemiparesis following unspecified cerebrovascular disease affecting unspecified side: Secondary | ICD-10-CM | POA: Insufficient documentation

## 2019-09-06 DIAGNOSIS — Z952 Presence of prosthetic heart valve: Secondary | ICD-10-CM | POA: Insufficient documentation

## 2019-09-06 DIAGNOSIS — Z860100 Personal history of colon polyps, unspecified: Secondary | ICD-10-CM | POA: Insufficient documentation

## 2019-09-06 DIAGNOSIS — F17200 Nicotine dependence, unspecified, uncomplicated: Secondary | ICD-10-CM | POA: Insufficient documentation

## 2019-09-06 DIAGNOSIS — N1831 Chronic kidney disease, stage 3a: Secondary | ICD-10-CM | POA: Diagnosis present

## 2019-09-06 DIAGNOSIS — E785 Hyperlipidemia, unspecified: Secondary | ICD-10-CM | POA: Insufficient documentation

## 2019-09-06 DIAGNOSIS — Z7901 Long term (current) use of anticoagulants: Secondary | ICD-10-CM

## 2019-09-15 ENCOUNTER — Other Ambulatory Visit: Payer: Self-pay

## 2019-09-15 ENCOUNTER — Ambulatory Visit (INDEPENDENT_AMBULATORY_CARE_PROVIDER_SITE_OTHER): Payer: 59 | Admitting: *Deleted

## 2019-09-15 DIAGNOSIS — Z5181 Encounter for therapeutic drug level monitoring: Secondary | ICD-10-CM

## 2019-09-15 DIAGNOSIS — Z8673 Personal history of transient ischemic attack (TIA), and cerebral infarction without residual deficits: Secondary | ICD-10-CM | POA: Diagnosis not present

## 2019-09-15 DIAGNOSIS — Z7901 Long term (current) use of anticoagulants: Secondary | ICD-10-CM | POA: Diagnosis not present

## 2019-09-15 DIAGNOSIS — Z952 Presence of prosthetic heart valve: Secondary | ICD-10-CM

## 2019-09-15 LAB — POCT INR: INR: 2.4 (ref 2.0–3.0)

## 2019-09-15 NOTE — Patient Instructions (Signed)
Description   Continue taking 1 tablet daily except 1/2 tablet on Mondays. Continue eating cole slaw weekly.  Recheck INR in 7 weeks.  Call coumadin clinic with any new medications, scheduled for any procedures or with any questions [phone].

## 2019-10-11 ENCOUNTER — Other Ambulatory Visit: Payer: Self-pay | Admitting: Cardiology

## 2019-10-22 ENCOUNTER — Other Ambulatory Visit: Payer: Self-pay | Admitting: Cardiology

## 2019-11-03 ENCOUNTER — Other Ambulatory Visit: Payer: Self-pay

## 2019-11-03 ENCOUNTER — Ambulatory Visit (INDEPENDENT_AMBULATORY_CARE_PROVIDER_SITE_OTHER): Payer: 59 | Admitting: *Deleted

## 2019-11-03 DIAGNOSIS — Z952 Presence of prosthetic heart valve: Secondary | ICD-10-CM

## 2019-11-03 DIAGNOSIS — Z5181 Encounter for therapeutic drug level monitoring: Secondary | ICD-10-CM | POA: Diagnosis not present

## 2019-11-03 DIAGNOSIS — Z7901 Long term (current) use of anticoagulants: Secondary | ICD-10-CM

## 2019-11-03 LAB — POCT INR: INR: 2.4 (ref 2.0–3.0)

## 2019-11-03 NOTE — Patient Instructions (Signed)
Description   Continue taking 1 tablet daily except 1/2 tablet on Mondays. Continue eating cole slaw weekly.  Recheck INR in 8 weeks.  Call coumadin clinic with any new medications, scheduled for any procedures or with any questions #[phone].      

## 2020-01-03 ENCOUNTER — Encounter: Payer: Self-pay | Admitting: Cardiology

## 2020-01-03 ENCOUNTER — Other Ambulatory Visit: Payer: Self-pay

## 2020-01-03 ENCOUNTER — Ambulatory Visit (INDEPENDENT_AMBULATORY_CARE_PROVIDER_SITE_OTHER): Payer: 59

## 2020-01-03 ENCOUNTER — Ambulatory Visit (INDEPENDENT_AMBULATORY_CARE_PROVIDER_SITE_OTHER): Payer: 59 | Admitting: Cardiology

## 2020-01-03 VITALS — BP 134/78 | HR 91 | Ht 62.0 in | Wt 160.0 lb

## 2020-01-03 DIAGNOSIS — Z8673 Personal history of transient ischemic attack (TIA), and cerebral infarction without residual deficits: Secondary | ICD-10-CM

## 2020-01-03 DIAGNOSIS — Z952 Presence of prosthetic heart valve: Secondary | ICD-10-CM

## 2020-01-03 DIAGNOSIS — I2510 Atherosclerotic heart disease of native coronary artery without angina pectoris: Secondary | ICD-10-CM

## 2020-01-03 DIAGNOSIS — Z951 Presence of aortocoronary bypass graft: Secondary | ICD-10-CM | POA: Diagnosis not present

## 2020-01-03 DIAGNOSIS — E782 Mixed hyperlipidemia: Secondary | ICD-10-CM

## 2020-01-03 DIAGNOSIS — Z7901 Long term (current) use of anticoagulants: Secondary | ICD-10-CM | POA: Diagnosis not present

## 2020-01-03 DIAGNOSIS — I10 Essential (primary) hypertension: Secondary | ICD-10-CM | POA: Diagnosis not present

## 2020-01-03 DIAGNOSIS — Z5181 Encounter for therapeutic drug level monitoring: Secondary | ICD-10-CM | POA: Diagnosis not present

## 2020-01-03 LAB — POCT INR: INR: 2.3 (ref 2.0–3.0)

## 2020-01-03 NOTE — Progress Notes (Signed)
Cardiology Office Note:    Date:  01/03/2020   ID:  Elizabeth Morton, DOB 06/18/1956, MRN [id number]  PCP:  Michael Boston, MD  [facility] Cardiologist:  No primary care provider on file.  [facility] Electrophysiologist:  None   Referring MD: No ref. provider found   Reason for visit: Annual follow-up  History of Present Illness:    Elizabeth Morton is a 64 y.o. female with a history of severe AS/CAD s/p CABGx2 (LIMA-LAD, SVG-diag) and AVR in 2006 (mechanical aortic valve as part of a Bentall procedure with replacement of her ascending aortic root), chronic anticoagulation with Coumadin, HTN, dyslipidemia, CVA related to thrombosed aortic root aneurysm, insignificant pericardial effusion 2013, GERD, iron deficiency anemia, lower GIB/ABL anemia 06/2015. The patient was hospitalized in November 2017 for elective ventral hernia repair that was complicated by acute blood loss with significant rectal sheath hematoma. These happen when the patient was being bridged with Lovenox. This is now a second occasion when this happened and patient was advised not to use Lovenox injections in the future. Her bleeding required use of FFP's and vitamin K. She was doing well on cardiac stand point when last evaluated by Dr. Meda Coffee 08/2016. Last echo 02/2017 showed LVEF of 60-65%, grade 1 DD, normal functioning aortic valve with mean gradient of 33mmg Hg from 41 mm Hg.   He was admitted in July 2020 with weakness fever with elevated lactic acid. Blood culture growing GPCs, BCID MSSA. Cardiology was involved for elevated Hs- troponin 535>>679 in setting of bacteremia.  She underwent TEE that rule out any vegetation on her prosthetic aortic valve.  Was followed by ID until September when her antibiotics were discontinued.  01/03/2020  -the patient is coming after 1 year, Feels great, she denies any further fever or chills, no chest pain shortness of breath, no palpitation dizziness or syncope, she has mild  swelling around her ankle predominantly on the right side where she sprained her ankle in the past.  She started to walk she is now up to 15 minutes every day without any symptoms.  She is motivated to keep walking she really enjoys it.  She has been compliant with her meds and has no side effects, no bleeding.  Past Medical History:  Diagnosis Date  . Aortic stenosis    Aortic valve replacement 2006  . Bacteremia 01/19/2019  . CAD (coronary artery disease)    a. s/p CABG 2006 at time of AVR.  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 Dyslipidemia   . Gallstones   . GERD (gastroesophageal reflux disease)   . Heart murmur   . Hx of CABG    2006,LIMA to LAD, SVG to diagonal  . Hx of transfusion of packed red blood cells   . Hypertension   . Hypothyroidism   . Iron deficiency anemia   . Lower GI bleed 06/2015  . Pericardial effusion    Insignificant small pericardial effusion seen on echo, November, 2013  . Peripheral vascular disease ([place]) 06   blood clot -stroke  . S/P aortic valve replacement    a. Bentall procedure,#21 [employer] Jude mechanical valve conduit with reimplantation of the coronaries 2006  . Status post colonoscopy with polypectomy    "bleeding; colonoscopy was ~ 06/18/2015"  . Stroke [facility]) 2006   no deficits   . Warfarin anticoagulation    coumadin therapy    Past Surgical History:  Procedure Laterality Date  .  AORTIC VALVE REPLACEMENT  2006  . BACK SURGERY    . CARDIAC CATHETERIZATION  ~ 2006  . CARDIAC VALVE REPLACEMENT    . CESAREAN SECTION  1985  . COLONOSCOPY N/A 06/26/2015   Procedure: COLONOSCOPY;  Surgeon: Gatha Mayer, MD;  Location: [place];  Service: Endoscopy;  Laterality: N/A;  . COLONOSCOPY W/ BIOPSIES AND POLYPECTOMY  06/18/2015  . CORONARY ARTERY BYPASS GRAFT  2006  . ESOPHAGOGASTRODUODENOSCOPY  06/18/2015  . INSERTION OF MESH  05/15/2016   Procedure: INSERTION OF MESH;  Surgeon: Johnathan Hausen, MD;   Location: [facility];  Service: General;;  . LAPAROSCOPIC ASSISTED VENTRAL HERNIA REPAIR N/A 05/15/2016   Procedure: LAPAROSCOPIC ASSISTED VENTRAL HERNIA REPAIR WITH MESH;  Surgeon: Johnathan Hausen, MD;  Location: [facility];  Service: General;  Laterality: N/A;  . LAPAROSCOPIC CHOLECYSTECTOMY SINGLE PORT  07/21/2012   Procedure: LAPAROSCOPIC CHOLECYSTECTOMY SINGLE PORT;  Surgeon: Adin Hector, MD;  Location: [place];  Service: General;  Laterality: N/A;  . LUMBAR FUSION  ~ 1973  . TEE WITHOUT CARDIOVERSION N/A 01/19/2019   Procedure: TRANSESOPHAGEAL ECHOCARDIOGRAM (TEE);  Surgeon: Lelon Perla, MD;  Location: [facility]COPY;  Service: Cardiovascular;  Laterality: N/A;  . TONSILLECTOMY      Current Medications: Current Meds  Medication Sig  . acetaminophen (TYLENOL) 500 MG tablet Take 500-1,000 mg by mouth every 6 (six) hours as needed (for pain or headaches).   . Calcium Carbonate-Vitamin D (CALCIUM 600+D) 600-400 MG-UNIT per tablet Take 1 tablet by mouth 2 (two) times daily.  . furosemide (LASIX) 20 MG tablet Take 20 mg by mouth daily.  Marland Kitchen levothyroxine (SYNTHROID, LEVOTHROID) 50 MCG tablet Take 50 mcg by mouth daily before breakfast.  . Multiple Vitamins-Minerals (PRESERVISION/LUTEIN PO) Take 1 capsule by mouth daily with breakfast.   . potassium chloride SA (KLOR-CON) 20 MEQ tablet Take 20 mEq by mouth. Takes occassionally  . simvastatin (ZOCOR) 40 MG tablet Take 1 tablet (40 mg total) by mouth at bedtime.  . triazolam (HALCION) 0.25 MG tablet Take 0.25 mg by mouth at bedtime.   Marland Kitchen warfarin (COUMADIN) 3 MG tablet TAKE 1 TABLET DAILY EXCEPT 1/2 TABLET ON MONDAYS OR AS DIRECTED BY ANTICOAGULATION CLINIC.     Allergies:   Patient has no known allergies.   Social History   Socioeconomic History  . Marital status: Married    Spouse name: Not on file  . Number of children: 1  . Years of education: Not on file  . Highest education level: Not on file  Occupational History  . Occupation: homemaker   Tobacco Use  . Smoking status: Former Smoker    Packs/day: 1.00    Years: 25.00    Pack years: 25.00    Types: Cigarettes    Quit date: 07/15/2004    Years since quitting: 15.4  . Smokeless tobacco: Never Used  Vaping Use  . Vaping Use: Never used  Substance and Sexual Activity  . Alcohol use: No  . Drug use: No  . Sexual activity: Not Currently  Other Topics Concern  . Not on file  Social History Narrative   She lives in [place] with her husband and son. She previously smoked 37-pack-years,but quit following her stroke in April of 2006. She denies any alcohol or drugs. She has not been routinely exercising.   Social Determinants of Health   Financial Resource Strain:   . Difficulty of Paying Living Expenses:   Food Insecurity:   . Worried About Charity fundraiser in  the Last Year:   . [place] in the Last Year:   Transportation Needs:   . Film/video editor (Medical):   Marland Kitchen Lack of Transportation (Non-Medical):   Physical Activity:   . Days of Exercise per Week:   . Minutes of Exercise per Session:   Stress:   . Feeling of Stress :   Social Connections:   . Frequency of Communication with Friends and Family:   . Frequency of Social Gatherings with Friends and Family:   . Attends Religious Services:   . Active Member of Clubs or Organizations:   . Attends Archivist Meetings:   Marland Kitchen Marital Status:      Family History: The patient's family history includes Alcohol abuse in her father; Heart disease in her mother; Hyperlipidemia in her father; Hypertension in her father; Stroke in her brother, father, and mother. There is no history of Colon cancer.  ROS:   Please see the history of present illness.    All other systems reviewed and are negative.  EKGs/Labs/Other Studies Reviewed:    The following studies were reviewed today:  TTE: 01/2019 1. The left ventricle has normal systolic function with an ejection  fraction of 60-65%. The cavity  size was normal. There is mild asymmetric  left ventricular hypertrophy. Left ventricular diastolic Doppler  parameters are consistent with impaired  relaxation. Elevated left ventricular end-diastolic pressure No evidence  of left ventricular regional wall motion abnormalities.  2. The right ventricle has normal systolic function. The cavity was  normal. There is no increase in right ventricular wall thickness. Right  ventricular systolic pressure could not be assessed.  3. S/P mechanical AVR. Valve prosthesis not well visualized. The mean AVG  is 76mmHg, AVA 1.25cm2 and dimensionless index normal at 0.5. No  perivalvular AI noted.  4. The interatrial septum appears to be lipomatous.  5. Trivial pericardial effusion is present  6. The pericardial effusion is posterior to the left ventricle.  7. Compared to echo 2018, the mean AVG has decreased from 96mmHg to  54mmHg.   EKG:  EKG is ordered today.  The ekg ordered today demonstrates sinus rhythm, 83 bpm, nonspecific ST-T wave abnormalities, ST depressions in anterolateral leads present on EKG in July 2020 are no longer present.  Recent Labs: 01/21/2019: ALT 19; BUN 10; Creatinine, Ser 1.07; Hemoglobin 12.7; Platelets 177; Potassium 3.6; Sodium 138  Recent Lipid Panel No results found for: CHOL, TRIG, HDL, CHOLHDL, VLDL, LDLCALC, LDLDIRECT  Physical Exam:    VS:  BP 134/78   Pulse 91   Ht 5\' 2"  (1.575 m)   Wt 160 lb (72.6 kg)   SpO2 97%   BMI 29.26 kg/m     Wt Readings from Last 3 Encounters:  01/03/20 160 lb (72.6 kg)  01/21/19 159 lb 2.8 oz (72.2 kg)  05/04/17 154 lb (69.9 kg)     GEN:  Well nourished, well developed in no acute distress HEENT: Normal NECK: No JVD; No carotid bruits LYMPHATICS: No lymphadenopathy CARDIAC: RRR, loud S2 click, 2/6 systolic murmurs, rubs, gallops RESPIRATORY:  Clear to auscultation without rales, wheezing or rhonchi  ABDOMEN: Soft, non-tender, non-distended MUSCULOSKELETAL:  No  edema; No deformity  SKIN: Warm and dry NEUROLOGIC:  Alert and oriented x 3 PSYCHIATRIC:  Normal affect   ASSESSMENT:    1. S/P aortic valve replacement   2. Coronary artery disease involving native coronary artery of native heart without angina pectoris   3. Hx of CABG  4. Essential hypertension   5. Mixed hyperlipidemia   6. Long term (current) use of anticoagulants    PLAN:    In order of problems listed above:  1. Aortic stenosis s/p mechanical AVR - most recent echo in July 2020 showed mean gradient of 14 mmHg, she is compliant with warfarin and has no bleeding, most recent INR 2.1. 2. CAD s/p CABG - no recent anginal sx. Not on ASA due to h/o bleeding issues while on concomitant warfarin (reviewed with Dr. Meda Coffee). Continue statin.  3. Essential HTN - controlled. 4. Hyperlipidemia -HDL and LDL at goal, however high triglycerides, she wants to try diet and exercise first before we start her on extremity [place].   Medication Adjustments/Labs and Tests Ordered: Current medicines are reviewed at length with the patient today.  Concerns regarding medicines are outlined above.  Orders Placed This Encounter  Procedures  . EKG 12-Lead   No orders of the defined types were placed in this encounter.   Patient Instructions  Medication Instructions:   Your physician recommends that you continue on your current medications as directed. Please refer to the Current Medication list given to you today.  *If you need a refill on your cardiac medications before your next appointment, please call your pharmacy*  Follow-Up: At [facility], you and your health needs are our priority.  As part of our continuing mission to provide you with exceptional heart care, we have created designated Provider Care Teams.  These Care Teams include your primary Cardiologist (physician) and Advanced Practice Providers (APPs -  Physician Assistants and Nurse Practitioners) who all work together to  provide you with the care you need, when you need it.  We recommend signing up for the patient portal called "MyChart".  Sign up information is provided on this After Visit Summary.  MyChart is used to connect with patients for Virtual Visits (Telemedicine).  Patients are able to view lab/test results, encounter notes, upcoming appointments, etc.  Non-urgent messages can be sent to your provider as well.   To learn more about what you can do with MyChart, go to [email or web address].    Your next appointment:   12 month(s)  The format for your next appointment:   In Person  Provider:   Ena Dawley, MD       Signed, Ena Dawley, MD  01/03/2020 3:39 PM    [place]

## 2020-01-03 NOTE — Patient Instructions (Signed)
Medication Instructions:  ° °Your physician recommends that you continue on your current medications as directed. Please refer to the Current Medication list given to you today. ° °*If you need a refill on your cardiac medications before your next appointment, please call your pharmacy* ° °Follow-Up: °At [facility], you and your health needs are our priority.  As part of our continuing mission to provide you with exceptional heart care, we have created designated Provider Care Teams.  These Care Teams include your primary Cardiologist (physician) and Advanced Practice Providers (APPs -  Physician Assistants and Nurse Practitioners) who all work together to provide you with the care you need, when you need it. ° °We recommend signing up for the patient portal called "MyChart".  Sign up information is provided on this After Visit Summary.  MyChart is used to connect with patients for Virtual Visits (Telemedicine).  Patients are able to view lab/test results, encounter notes, upcoming appointments, etc.  Non-urgent messages can be sent to your provider as well.   °To learn more about what you can do with MyChart, go to [email or web address].   ° °Your next appointment:   °12 month(s) ° °The format for your next appointment:   °In Person ° °Provider:   °Katarina Nelson, MD ° ° ° ° °

## 2020-01-03 NOTE — Patient Instructions (Signed)
Continue taking 1 tablet daily except 1/2 tablet on Mondays. Continue eating cole slaw weekly.  Recheck INR in 8 weeks.  Call coumadin clinic with any new medications, scheduled for any procedures or with any questions [phone].

## 2020-02-09 ENCOUNTER — Other Ambulatory Visit: Payer: Self-pay | Admitting: Cardiology

## 2020-02-28 ENCOUNTER — Ambulatory Visit (INDEPENDENT_AMBULATORY_CARE_PROVIDER_SITE_OTHER): Payer: 59 | Admitting: *Deleted

## 2020-02-28 ENCOUNTER — Other Ambulatory Visit: Payer: Self-pay

## 2020-02-28 DIAGNOSIS — Z7901 Long term (current) use of anticoagulants: Secondary | ICD-10-CM | POA: Diagnosis not present

## 2020-02-28 DIAGNOSIS — Z5181 Encounter for therapeutic drug level monitoring: Secondary | ICD-10-CM

## 2020-02-28 DIAGNOSIS — Z952 Presence of prosthetic heart valve: Secondary | ICD-10-CM | POA: Diagnosis not present

## 2020-02-28 DIAGNOSIS — Z8673 Personal history of transient ischemic attack (TIA), and cerebral infarction without residual deficits: Secondary | ICD-10-CM | POA: Diagnosis not present

## 2020-02-28 LAB — POCT INR: INR: 2.4 (ref 2.0–3.0)

## 2020-02-28 NOTE — Patient Instructions (Signed)
Description   Continue taking 1 tablet daily except 1/2 tablet on Mondays. Continue eating cole slaw weekly.  Recheck INR in 8 weeks.  Call coumadin clinic with any new medications, scheduled for any procedures or with any questions [phone].

## 2020-04-23 ENCOUNTER — Other Ambulatory Visit: Payer: Self-pay | Admitting: Cardiology

## 2020-04-24 ENCOUNTER — Ambulatory Visit (INDEPENDENT_AMBULATORY_CARE_PROVIDER_SITE_OTHER): Payer: 59 | Admitting: *Deleted

## 2020-04-24 ENCOUNTER — Other Ambulatory Visit: Payer: Self-pay

## 2020-04-24 DIAGNOSIS — Z5181 Encounter for therapeutic drug level monitoring: Secondary | ICD-10-CM | POA: Diagnosis not present

## 2020-04-24 DIAGNOSIS — Z952 Presence of prosthetic heart valve: Secondary | ICD-10-CM

## 2020-04-24 DIAGNOSIS — Z7901 Long term (current) use of anticoagulants: Secondary | ICD-10-CM

## 2020-04-24 LAB — POCT INR: INR: 2 (ref 2.0–3.0)

## 2020-04-24 NOTE — Patient Instructions (Signed)
Description   Continue taking 1 tablet daily except 1/2 tablet on Mondays. Continue eating cole slaw weekly.  Recheck INR in 8 weeks.  Call coumadin clinic with any new medications, scheduled for any procedures or with any questions [phone].

## 2020-06-03 ENCOUNTER — Other Ambulatory Visit: Payer: Self-pay | Admitting: Internal Medicine

## 2020-06-03 DIAGNOSIS — Z1231 Encounter for screening mammogram for malignant neoplasm of breast: Secondary | ICD-10-CM

## 2020-06-19 ENCOUNTER — Ambulatory Visit (INDEPENDENT_AMBULATORY_CARE_PROVIDER_SITE_OTHER): Payer: 59 | Admitting: *Deleted

## 2020-06-19 ENCOUNTER — Other Ambulatory Visit: Payer: Self-pay

## 2020-06-19 DIAGNOSIS — Z8673 Personal history of transient ischemic attack (TIA), and cerebral infarction without residual deficits: Secondary | ICD-10-CM

## 2020-06-19 DIAGNOSIS — Z7901 Long term (current) use of anticoagulants: Secondary | ICD-10-CM | POA: Diagnosis not present

## 2020-06-19 DIAGNOSIS — Z5181 Encounter for therapeutic drug level monitoring: Secondary | ICD-10-CM | POA: Diagnosis not present

## 2020-06-19 DIAGNOSIS — Z952 Presence of prosthetic heart valve: Secondary | ICD-10-CM

## 2020-06-19 LAB — POCT INR: INR: 4.2 — AB (ref 2.0–3.0)

## 2020-06-19 NOTE — Patient Instructions (Addendum)
  Description   Do not take any Warfarin today and take 1/2 tablet tomorrow then continue taking 1 tablet daily except 1/2 tablet on Mondays. Continue eating cole slaw weekly. Recheck INR in 4 week (normally 8 weeks).  Call coumadin clinic with any new medications, scheduled for any procedures or with any questions [phone].

## 2020-07-18 ENCOUNTER — Other Ambulatory Visit: Payer: Self-pay

## 2020-07-18 ENCOUNTER — Ambulatory Visit
Admission: RE | Admit: 2020-07-18 | Discharge: 2020-07-18 | Disposition: A | Discharge status: Home / Self Care | Payer: 59 | Source: Ambulatory Visit | Attending: Internal Medicine | Admitting: Internal Medicine

## 2020-07-18 ENCOUNTER — Ambulatory Visit (INDEPENDENT_AMBULATORY_CARE_PROVIDER_SITE_OTHER): Payer: 59

## 2020-07-18 DIAGNOSIS — Z7901 Long term (current) use of anticoagulants: Secondary | ICD-10-CM | POA: Diagnosis not present

## 2020-07-18 DIAGNOSIS — Z8673 Personal history of transient ischemic attack (TIA), and cerebral infarction without residual deficits: Secondary | ICD-10-CM | POA: Diagnosis not present

## 2020-07-18 DIAGNOSIS — Z5181 Encounter for therapeutic drug level monitoring: Secondary | ICD-10-CM | POA: Diagnosis not present

## 2020-07-18 DIAGNOSIS — Z1231 Encounter for screening mammogram for malignant neoplasm of breast: Secondary | ICD-10-CM

## 2020-07-18 DIAGNOSIS — Z952 Presence of prosthetic heart valve: Secondary | ICD-10-CM | POA: Diagnosis not present

## 2020-07-18 LAB — POCT INR: INR: 2.2 (ref 2.0–3.0)

## 2020-07-18 NOTE — Patient Instructions (Signed)
Description   Continue on same dosage 1 tablet daily except 1/2 tablet on Mondays. Continue eating cole slaw weekly. Recheck INR in 6 weeks (normally 8 weeks).  Call coumadin clinic with any new medications, scheduled for any procedures or with any questions [phone].

## 2020-08-29 ENCOUNTER — Ambulatory Visit (INDEPENDENT_AMBULATORY_CARE_PROVIDER_SITE_OTHER): Payer: 59 | Admitting: *Deleted

## 2020-08-29 ENCOUNTER — Other Ambulatory Visit: Payer: Self-pay

## 2020-08-29 ENCOUNTER — Encounter (INDEPENDENT_AMBULATORY_CARE_PROVIDER_SITE_OTHER): Payer: Self-pay

## 2020-08-29 DIAGNOSIS — Z952 Presence of prosthetic heart valve: Secondary | ICD-10-CM | POA: Diagnosis not present

## 2020-08-29 DIAGNOSIS — Z5181 Encounter for therapeutic drug level monitoring: Secondary | ICD-10-CM

## 2020-08-29 DIAGNOSIS — Z7901 Long term (current) use of anticoagulants: Secondary | ICD-10-CM | POA: Diagnosis not present

## 2020-08-29 LAB — POCT INR: INR: 2.4 (ref 2.0–3.0)

## 2020-08-29 NOTE — Patient Instructions (Signed)
Description   Continue on same dosage 1 tablet daily except 1/2 tablet on Mondays. Continue eating cole slaw weekly. Recheck INR in 7 weeks. Call coumadin clinic with any new medications, scheduled for any procedures or with any questions [phone].

## 2020-10-17 ENCOUNTER — Other Ambulatory Visit: Payer: Self-pay

## 2020-10-17 ENCOUNTER — Ambulatory Visit (INDEPENDENT_AMBULATORY_CARE_PROVIDER_SITE_OTHER): Payer: 59

## 2020-10-17 DIAGNOSIS — Z5181 Encounter for therapeutic drug level monitoring: Secondary | ICD-10-CM | POA: Diagnosis not present

## 2020-10-17 DIAGNOSIS — Z7901 Long term (current) use of anticoagulants: Secondary | ICD-10-CM | POA: Diagnosis not present

## 2020-10-17 DIAGNOSIS — Z8673 Personal history of transient ischemic attack (TIA), and cerebral infarction without residual deficits: Secondary | ICD-10-CM | POA: Diagnosis not present

## 2020-10-17 DIAGNOSIS — Z952 Presence of prosthetic heart valve: Secondary | ICD-10-CM | POA: Diagnosis not present

## 2020-10-17 LAB — POCT INR: INR: 2.1 (ref 2.0–3.0)

## 2020-10-17 NOTE — Patient Instructions (Signed)
Description   Continue on same dosage 1 tablet daily except 1/2 tablet on Mondays. Continue eating cole slaw weekly. Recheck INR in 8 weeks. Call coumadin clinic with any new medications, scheduled for any procedures or with any questions [phone].

## 2020-11-18 ENCOUNTER — Other Ambulatory Visit: Payer: Self-pay | Admitting: Cardiology

## 2020-12-16 ENCOUNTER — Ambulatory Visit (INDEPENDENT_AMBULATORY_CARE_PROVIDER_SITE_OTHER): Payer: 59 | Admitting: *Deleted

## 2020-12-16 ENCOUNTER — Other Ambulatory Visit: Payer: Self-pay

## 2020-12-16 DIAGNOSIS — Z7901 Long term (current) use of anticoagulants: Secondary | ICD-10-CM | POA: Diagnosis not present

## 2020-12-16 DIAGNOSIS — Z5181 Encounter for therapeutic drug level monitoring: Secondary | ICD-10-CM

## 2020-12-16 DIAGNOSIS — Z952 Presence of prosthetic heart valve: Secondary | ICD-10-CM | POA: Diagnosis not present

## 2020-12-16 LAB — POCT INR: INR: 2.1 (ref 2.0–3.0)

## 2020-12-16 NOTE — Patient Instructions (Signed)
Description   Continue on same dosage 1 tablet daily except 1/2 tablet on Mondays. Continue eating cole slaw weekly. Recheck INR in 8 weeks. Call coumadin clinic with any new medications, scheduled for any procedures or with any questions [phone].

## 2021-01-22 NOTE — Progress Notes (Signed)
Cardiology Office Note:    Date:  01/27/2021   ID:  Elizabeth Morton, DOB [birth year], MRN [id number]  PCP:  Michael Boston, MD   [facility] Providers Cardiologist:  Freada Bergeron, MD {   Referring MD: Michael Boston, MD    History of Present Illness:    Elizabeth Morton is a 65 y.o. 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 Meda Coffee who now reutrns to clinic for follow-up.  Per review of the record, the patient was hospitalized in 05/2016 for elective ventral hernia repair that was complicated by acute blood loss with significant rectal sheath hematoma. These happen when the patient was being bridged with Lovenox. Last echo 02/2017 showed LVEF of 60-65%, grade 1 DD, normal functioning aortic valve with mean gradient of 31mmg Hg from 41 mm Hg.    She was admitted in July 2020 with weakness fever with elevated lactic acid. Blood culture growing GPCs, BCID MSSA. Cardiology was involved for elevated Hs- troponin 535>>679 in setting of bacteremia.  She underwent TEE that rule out any vegetation on her prosthetic aortic valve.  Last saw Dr. Meda Coffee on 01/03/20 where she was doing well without exertional symptoms.  Today, the patient feels overall well. No chest pain, SOB, LE edema, lightheadedness or dizziness. Tolerating warfarin without issues. No blood in stool or urine. Blood pressure has been controlled. Has decreased activity due to heat outside but was previously walking 17miles/day without exertional symptoms.   Past Medical History:  Diagnosis Date   Aortic stenosis    Aortic valve replacement 2006   Bacteremia 01/19/2019   CAD (coronary artery disease)     a. s/p CABG 2006 at time of AVR.   CVA (cerebral infarction)    related to thrombosed aortic root aneurysm   Dyslipidemia    Gallstones    GERD (gastroesophageal reflux disease)    Heart murmur    Hx of CABG    2006,LIMA to LAD, SVG to diagonal   Hx of transfusion of packed red blood cells    Hypertension    Hypothyroidism    Iron deficiency anemia    Lower GI bleed 06/2015   Pericardial effusion    Insignificant small pericardial effusion seen on echo, November, 2013   Peripheral vascular disease ([place]) 06   blood clot -stroke   S/P aortic valve replacement    a. Bentall procedure,#21 [employer] mechanical valve conduit with reimplantation of the coronaries 2006   Status post colonoscopy with polypectomy    "bleeding; colonoscopy was ~ 06/18/2015"   Stroke [facility]) 2006   no deficits    Warfarin anticoagulation    coumadin therapy    Past Surgical History:  Procedure Laterality Date   AORTIC VALVE REPLACEMENT  2006   BACK SURGERY     CARDIAC CATHETERIZATION  ~ 2006   [place]   COLONOSCOPY N/A 06/26/2015   Procedure: COLONOSCOPY;  Surgeon: Gatha Mayer, MD;  Location: [facility];  Service: Endoscopy;  Laterality: N/A;   COLONOSCOPY W/ BIOPSIES AND POLYPECTOMY  06/18/2015   CORONARY ARTERY BYPASS GRAFT  2006   ESOPHAGOGASTRODUODENOSCOPY  06/18/2015   INSERTION  OF MESH  05/15/2016   Procedure: INSERTION OF MESH;  Surgeon: Johnathan Hausen, MD;  Location: [facility];  Service: General;;   LAPAROSCOPIC ASSISTED VENTRAL HERNIA REPAIR N/A 05/15/2016   Procedure: LAPAROSCOPIC ASSISTED VENTRAL HERNIA REPAIR WITH MESH;  Surgeon: Johnathan Hausen, MD;  Location: [facility];  Service: General;  Laterality: N/A;   LAPAROSCOPIC CHOLECYSTECTOMY SINGLE PORT  07/21/2012   Procedure: LAPAROSCOPIC CHOLECYSTECTOMY SINGLE PORT;  Surgeon: Adin Hector, MD;  Location: [facility];  Service: General;  Laterality: N/A;   LUMBAR FUSION  ~ 1973   TEE WITHOUT CARDIOVERSION  N/A 01/19/2019   Procedure: TRANSESOPHAGEAL ECHOCARDIOGRAM (TEE);  Surgeon: Lelon Perla, MD;  Location: [facility] ENDOSCOPY;  Service: Cardiovascular;  Laterality: N/A;   TONSILLECTOMY      Current Medications: Current Meds  Medication Sig   acetaminophen (TYLENOL) 500 MG tablet Take 500-1,000 mg by mouth every 6 (six) hours as needed (for pain or headaches).    Calcium Carbonate-Vitamin D 600-400 MG-UNIT tablet Take 1 tablet by mouth 2 (two) times daily.   furosemide (LASIX) 20 MG tablet Take 20 mg by mouth daily.   levothyroxine (SYNTHROID, LEVOTHROID) 50 MCG tablet Take 50 mcg by mouth daily before breakfast.   losartan (COZAAR) 25 MG tablet Take 25 mg by mouth daily.   Multiple Vitamins-Minerals (PRESERVISION/LUTEIN PO) Take 1 capsule by mouth daily with breakfast.    rosuvastatin (CRESTOR) 20 MG tablet Take 20 mg by mouth daily.   triazolam (HALCION) 0.25 MG tablet Take 0.25 mg by mouth at bedtime.    warfarin (COUMADIN) 3 MG tablet TAKE 1 TABLET DAILY EXCEPT 1/2 TABLET ON MONDAYS OR AS DIRECTED BY ANTICOAGULATION CLINIC.     Allergies:   Patient has no known allergies.   Social History   Socioeconomic History   Marital status: Married    Spouse name: Not on file   Number of children: 1   Years of education: Not on file   Highest education level: Not on file  Occupational History   Occupation: homemaker  Tobacco Use   Smoking status: Former    Packs/day: 1.00    Years: 25.00    Pack years: 25.00    Types: Cigarettes    Quit date: 07/15/2004    Years since quitting: 16.5   Smokeless tobacco: Never  Vaping Use   Vaping Use: Never used  Substance and Sexual Activity   Alcohol use: No   Drug use: No   Sexual activity: Not Currently  Other Topics Concern   Not on file  Social History Narrative   She lives in [place] with her husband and son. She previously smoked 37-pack-years,but quit following her stroke in April of 2006. She denies any alcohol or drugs. She has not  been routinely exercising.   Social Determinants of Health   Financial Resource Strain: Not on file  Food Insecurity: Not on file  Transportation Needs: Not on file  Physical Activity: Not on file  Stress: Not on file  Social Connections: Not on file     Family History: The patient's family history includes Alcohol abuse in her father; Heart disease in her mother; Hyperlipidemia in her father; Hypertension in her father; Stroke in her brother, father, and mother. There is no history of Colon cancer.  ROS:   Please see the history of present illness.    Review of Systems  Constitutional:  Negative for chills and fever.  HENT:  Negative for hearing loss.   Eyes:  Negative for blurred vision.  Respiratory:  Negative for shortness of breath.   Cardiovascular:  Negative for chest pain, palpitations, orthopnea, claudication, leg swelling and PND.  Gastrointestinal:  Negative for blood in stool, melena, nausea and vomiting.  Genitourinary:  Negative for hematuria.  Musculoskeletal:  Negative for falls.  Neurological:  Negative for dizziness and loss of consciousness.  Psychiatric/Behavioral:  Negative for substance abuse.     EKGs/Labs/Other Studies Reviewed:    The following studies were reviewed today: TTE: 01/2019  1. The left ventricle has normal systolic function with an ejection  fraction of 60-65%. The cavity size was normal. There is mild asymmetric  left ventricular hypertrophy. Left ventricular diastolic Doppler  parameters are consistent with impaired  relaxation. Elevated left ventricular end-diastolic pressure No evidence  of left ventricular regional wall motion abnormalities.   2. The right ventricle has normal systolic function. The cavity was  normal. There is no increase in right ventricular wall thickness. Right  ventricular systolic pressure could not be assessed.   3. S/P mechanical AVR. Valve prosthesis not well visualized. The mean AVG  is 52mmHg, AVA 1.25cm2  and dimensionless index normal at 0.5. No  perivalvular AI noted.   4. The interatrial septum appears to be lipomatous.   5. Trivial pericardial effusion is present   6. The pericardial effusion is posterior to the left ventricle.   7. Compared to echo 2018, the mean AVG has decreased from 66mmHg to  79mmHg.  EKG:  EKG is  ordered today.  The ekg ordered today demonstrates NSR HR 76  Recent Labs: No results found for requested labs within last 8760 hours.  Recent Lipid Panel No results found for: CHOL, TRIG, HDL, CHOLHDL, VLDL, LDLCALC, LDLDIRECT   Risk Assessment/Calculations:           Physical Exam:    VS:  BP 132/72   Pulse 76   Ht 5\' 2"  (1.575 m)   Wt 165 lb 3.2 oz (74.9 kg)   SpO2 95%   BMI 30.22 kg/m     Wt Readings from Last 3 Encounters:  01/27/21 165 lb 3.2 oz (74.9 kg)  01/03/20 160 lb (72.6 kg)  01/21/19 159 lb 2.8 oz (72.2 kg)     GEN:  Well nourished, well developed in no acute distress HEENT: Normal NECK: No JVD; No carotid bruits LYMPHATICS: No lymphadenopathy CARDIAC: RRR, crisp mechanical AoV click with 2/6 systolic murmur. No rubs or gallops RESPIRATORY:  Clear to auscultation without rales, wheezing or rhonchi  ABDOMEN: Soft, non-tender, non-distended MUSCULOSKELETAL:  No edema; No deformity  SKIN: Warm and dry NEUROLOGIC:  Alert and oriented x 3 PSYCHIATRIC:  Normal affect   ASSESSMENT:    1. S/P aortic valve replacement   2. Encounter for therapeutic drug monitoring   3. Coronary artery disease involving native coronary artery of native heart without angina pectoris   4. Essential hypertension   5. Mitral regurgitation and aortic stenosis   6. Hx of CABG   7. Mixed hyperlipidemia    PLAN:    In order of problems listed above:  #Aortic Stenosis s/p Mechanical AVR:  TTE 01/2019 with LVEF 55-60%, mean gradient 60mmHg, DI 0.5, no AI. -Continue warfarin for AC; CANNOT TOLERATE LOVENOX bridge; will need heparin gtt for bridging for  future procedures -SBE ppx  #CAD s/p CABG in 2006: Doing well with no anginal symptoms. -Not on ASA due to need for warfarin -Continue crestor 20mg  daily -Continue losartan 20mg  daily  #Moderate MR: Noted on TEE in 2020.  Stable with no HF symptoms. -Repeat TTE for monitoring  #HTN: Blood pressure well controlled at home. -Continue losartan 20mg  daily  #HLD: -Continue crestor 20mg  daily -Repeat labs per primary (visit is 04/2021) -Goal LDL <70 given known CAD     Medication Adjustments/Labs and Tests Ordered: Current medicines are reviewed at length with the patient today.  Concerns regarding medicines are outlined above.  Orders Placed This Encounter  Procedures   EKG 12-Lead   ECHOCARDIOGRAM COMPLETE   No orders of the defined types were placed in this encounter.   Patient Instructions  Medication Instructions:  Your physician recommends that you continue on your current medications as directed. Please refer to the Current Medication list given to you today.  *If you need a refill on your cardiac medications before your next appointment, please call your pharmacy*   Lab Work: None ordered  If you have labs (blood work) drawn today and your tests are completely normal, you will receive your results only by: [place] (if you have MyChart) OR A paper copy in the mail If you have any lab test that is abnormal or we need to change your treatment, we will call you to review the results.   Testing/Procedures: Your physician has requested that you have an echocardiogram. Echocardiography is a painless test that uses sound waves to create images of your heart. It provides your doctor with information about the size and shape of your heart and how well your heart's chambers and valves are working. This procedure takes approximately one hour. There are no restrictions for this procedure.    Follow-Up: At [facility], you and your health needs are our priority.   As part of our continuing mission to provide you with exceptional heart care, we have created designated Provider Care Teams.  These Care Teams include your primary Cardiologist (physician) and Advanced Practice Providers (APPs -  Physician Assistants and Nurse Practitioners) who all work together to provide you with the care you need, when you need it.  We recommend signing up for the patient portal called "MyChart".  Sign up information is provided on this After Visit Summary.  MyChart is used to connect with patients for Virtual Visits (Telemedicine).  Patients are able to view lab/test results, encounter notes, upcoming appointments, etc.  Non-urgent messages can be sent to your provider as well.   To learn more about what you can do with MyChart, go to [email or web address].    Your next appointment:   12 month(s)  The format for your next appointment:   In Person  Provider:   You may see Gwyndolyn Kaufman, MD or one of the following Advanced Practice Providers on your designated Care Team:   Richardson Dopp, PA-C Robbie Lis, [place]   Other Instructions    Signed, Freada Bergeron, MD  01/27/2021 1:38 PM    [place]

## 2021-01-27 ENCOUNTER — Ambulatory Visit (INDEPENDENT_AMBULATORY_CARE_PROVIDER_SITE_OTHER): Payer: 59 | Admitting: Cardiology

## 2021-01-27 ENCOUNTER — Other Ambulatory Visit: Payer: Self-pay

## 2021-01-27 ENCOUNTER — Encounter: Payer: Self-pay | Admitting: Cardiology

## 2021-01-27 VITALS — BP 132/72 | HR 76 | Ht 62.0 in | Wt 165.2 lb

## 2021-01-27 DIAGNOSIS — I080 Rheumatic disorders of both mitral and aortic valves: Secondary | ICD-10-CM

## 2021-01-27 DIAGNOSIS — I2510 Atherosclerotic heart disease of native coronary artery without angina pectoris: Secondary | ICD-10-CM | POA: Diagnosis not present

## 2021-01-27 DIAGNOSIS — Z5181 Encounter for therapeutic drug level monitoring: Secondary | ICD-10-CM | POA: Diagnosis not present

## 2021-01-27 DIAGNOSIS — Z951 Presence of aortocoronary bypass graft: Secondary | ICD-10-CM

## 2021-01-27 DIAGNOSIS — I10 Essential (primary) hypertension: Secondary | ICD-10-CM | POA: Diagnosis not present

## 2021-01-27 DIAGNOSIS — E782 Mixed hyperlipidemia: Secondary | ICD-10-CM

## 2021-01-27 DIAGNOSIS — Z952 Presence of prosthetic heart valve: Secondary | ICD-10-CM

## 2021-01-27 NOTE — Patient Instructions (Addendum)
Medication Instructions:  Your physician recommends that you continue on your current medications as directed. Please refer to the Current Medication list given to you today.  *If you need a refill on your cardiac medications before your next appointment, please call your pharmacy*   Lab Work: None ordered  If you have labs (blood work) drawn today and your tests are completely normal, you will receive your results only by: [place] (if you have MyChart) OR A paper copy in the mail If you have any lab test that is abnormal or we need to change your treatment, we will call you to review the results.   Testing/Procedures: Your physician has requested that you have an echocardiogram. Echocardiography is a painless test that uses sound waves to create images of your heart. It provides your doctor with information about the size and shape of your heart and how well your heart's chambers and valves are working. This procedure takes approximately one hour. There are no restrictions for this procedure.    Follow-Up: At [facility], you and your health needs are our priority.  As part of our continuing mission to provide you with exceptional heart care, we have created designated Provider Care Teams.  These Care Teams include your primary Cardiologist (physician) and Advanced Practice Providers (APPs -  Physician Assistants and Nurse Practitioners) who all work together to provide you with the care you need, when you need it.  We recommend signing up for the patient portal called "MyChart".  Sign up information is provided on this After Visit Summary.  MyChart is used to connect with patients for Virtual Visits (Telemedicine).  Patients are able to view lab/test results, encounter notes, upcoming appointments, etc.  Non-urgent messages can be sent to your provider as well.   To learn more about what you can do with MyChart, go to [email or web address].    Your next appointment:   12  month(s)  The format for your next appointment:   In Person  Provider:   You may see Gwyndolyn Kaufman, MD or one of the following Advanced Practice Providers on your designated Care Team:   Richardson Dopp, PA-C Robbie Lis, [place]   Other Instructions

## 2021-02-14 ENCOUNTER — Ambulatory Visit (INDEPENDENT_AMBULATORY_CARE_PROVIDER_SITE_OTHER): Payer: 59

## 2021-02-14 ENCOUNTER — Other Ambulatory Visit: Payer: Self-pay

## 2021-02-14 ENCOUNTER — Ambulatory Visit (HOSPITAL_COMMUNITY): Payer: 59 | Attending: Cardiology

## 2021-02-14 DIAGNOSIS — Z5181 Encounter for therapeutic drug level monitoring: Secondary | ICD-10-CM | POA: Diagnosis not present

## 2021-02-14 DIAGNOSIS — Z7901 Long term (current) use of anticoagulants: Secondary | ICD-10-CM

## 2021-02-14 DIAGNOSIS — I080 Rheumatic disorders of both mitral and aortic valves: Secondary | ICD-10-CM | POA: Insufficient documentation

## 2021-02-14 DIAGNOSIS — Z952 Presence of prosthetic heart valve: Secondary | ICD-10-CM | POA: Diagnosis not present

## 2021-02-14 LAB — ECHOCARDIOGRAM COMPLETE
AV Mean grad: 7 mmHg
AV Peak grad: 11.7 mmHg
Ao pk vel: 1.71 m/s
Area-P 1/2: 3.39 cm2
S' Lateral: 2.2 cm

## 2021-02-14 LAB — POCT INR: INR: 1.9 — AB (ref 2.0–3.0)

## 2021-02-14 NOTE — Patient Instructions (Signed)
Description   Take 1.5 tablets tonight and then continue on same dosage 1 tablet daily except 1/2 tablet on Mondays. Continue eating cole slaw weekly. Recheck INR in 6 weeks. Call coumadin clinic with any new medications, scheduled for any procedures or with any questions [phone].

## 2021-02-20 ENCOUNTER — Other Ambulatory Visit: Payer: Self-pay | Admitting: *Deleted

## 2021-02-20 MED ORDER — WARFARIN SODIUM 3 MG PO TABS: ORAL_TABLET | ORAL | 3 refills | Status: DC

## 2021-03-06 ENCOUNTER — Telehealth: Payer: Self-pay | Admitting: Cardiology

## 2021-03-06 NOTE — Telephone Encounter (Signed)
Werner Lean, MD  02/16/2021  2:45 PM EDT     Results: Improved prosthetic gradients from prior Plan: No change in mgmt   Werner Lean, MD    The patient has been notified of the result and verbalized understanding.  All questions (if any) were answered. Nuala Alpha, LPN [id number] [phone] PM

## 2021-03-06 NOTE — Telephone Encounter (Signed)
Patient returning call for echo results. 

## 2021-03-14 ENCOUNTER — Other Ambulatory Visit: Payer: Self-pay

## 2021-03-14 ENCOUNTER — Ambulatory Visit: Payer: 59 | Admitting: *Deleted

## 2021-03-14 DIAGNOSIS — Z952 Presence of prosthetic heart valve: Secondary | ICD-10-CM

## 2021-03-14 DIAGNOSIS — Z5181 Encounter for therapeutic drug level monitoring: Secondary | ICD-10-CM | POA: Diagnosis not present

## 2021-03-14 DIAGNOSIS — Z7901 Long term (current) use of anticoagulants: Secondary | ICD-10-CM | POA: Diagnosis not present

## 2021-03-14 LAB — POCT INR: INR: 1.9 — AB (ref 2.0–3.0)

## 2021-03-14 NOTE — Patient Instructions (Signed)
Description    Take 1.5 tablets of warfarin today and then start taking warfarin 1 tablet daily recheck INR in 3 weeks. Coumadin Clinic [phone].

## 2021-04-07 ENCOUNTER — Other Ambulatory Visit: Payer: Self-pay

## 2021-04-07 ENCOUNTER — Ambulatory Visit: Payer: 59

## 2021-04-07 DIAGNOSIS — Z5181 Encounter for therapeutic drug level monitoring: Secondary | ICD-10-CM | POA: Diagnosis not present

## 2021-04-07 DIAGNOSIS — Z952 Presence of prosthetic heart valve: Secondary | ICD-10-CM

## 2021-04-07 DIAGNOSIS — Z7901 Long term (current) use of anticoagulants: Secondary | ICD-10-CM

## 2021-04-07 DIAGNOSIS — Z8673 Personal history of transient ischemic attack (TIA), and cerebral infarction without residual deficits: Secondary | ICD-10-CM

## 2021-04-07 LAB — POCT INR: INR: 2 (ref 2.0–3.0)

## 2021-04-07 NOTE — Patient Instructions (Signed)
Description   Take 1.5 tablets of warfarin today and then resume same dosage of warfarin 1 tablet daily.  Recheck INR in 4 weeks. Coumadin Clinic [phone].

## 2021-04-28 ENCOUNTER — Other Ambulatory Visit: Payer: Self-pay | Admitting: Internal Medicine

## 2021-04-28 DIAGNOSIS — M810 Age-related osteoporosis without current pathological fracture: Secondary | ICD-10-CM

## 2021-05-05 ENCOUNTER — Ambulatory Visit (INDEPENDENT_AMBULATORY_CARE_PROVIDER_SITE_OTHER): Payer: 59

## 2021-05-05 ENCOUNTER — Other Ambulatory Visit: Payer: Self-pay

## 2021-05-05 DIAGNOSIS — Z952 Presence of prosthetic heart valve: Secondary | ICD-10-CM | POA: Diagnosis not present

## 2021-05-05 DIAGNOSIS — Z5181 Encounter for therapeutic drug level monitoring: Secondary | ICD-10-CM

## 2021-05-05 DIAGNOSIS — Z7901 Long term (current) use of anticoagulants: Secondary | ICD-10-CM | POA: Diagnosis not present

## 2021-05-05 LAB — POCT INR: INR: 2.2 (ref 2.0–3.0)

## 2021-05-05 NOTE — Patient Instructions (Signed)
Description   Continue same dosage of warfarin 1 tablet daily.  Recheck INR in 6 weeks. Coumadin Clinic [phone].

## 2021-05-14 ENCOUNTER — Other Ambulatory Visit: Payer: Self-pay | Admitting: Internal Medicine

## 2021-05-14 DIAGNOSIS — Z1231 Encounter for screening mammogram for malignant neoplasm of breast: Secondary | ICD-10-CM

## 2021-06-16 ENCOUNTER — Ambulatory Visit: Payer: 59

## 2021-06-16 ENCOUNTER — Other Ambulatory Visit: Payer: Self-pay

## 2021-06-16 DIAGNOSIS — Z952 Presence of prosthetic heart valve: Secondary | ICD-10-CM | POA: Diagnosis not present

## 2021-06-16 DIAGNOSIS — Z8673 Personal history of transient ischemic attack (TIA), and cerebral infarction without residual deficits: Secondary | ICD-10-CM | POA: Diagnosis not present

## 2021-06-16 DIAGNOSIS — Z5181 Encounter for therapeutic drug level monitoring: Secondary | ICD-10-CM

## 2021-06-16 DIAGNOSIS — Z7901 Long term (current) use of anticoagulants: Secondary | ICD-10-CM | POA: Diagnosis not present

## 2021-06-16 LAB — POCT INR: INR: 4 — AB (ref 2.0–3.0)

## 2021-06-16 NOTE — Patient Instructions (Signed)
-   skip warfarin tonight, have some greens today, then - Continue same dosage of warfarin 1 tablet daily.   - Recheck INR in 6 weeks.  Coumadin Clinic [phone].

## 2021-07-21 ENCOUNTER — Ambulatory Visit
Admission: RE | Admit: 2021-07-21 | Discharge: 2021-07-21 | Disposition: A | Discharge status: Home / Self Care | Payer: 59 | Source: Ambulatory Visit | Attending: Internal Medicine | Admitting: Internal Medicine

## 2021-07-21 ENCOUNTER — Other Ambulatory Visit: Payer: Self-pay

## 2021-07-21 DIAGNOSIS — Z1231 Encounter for screening mammogram for malignant neoplasm of breast: Secondary | ICD-10-CM

## 2021-07-29 ENCOUNTER — Other Ambulatory Visit: Payer: Self-pay

## 2021-07-29 ENCOUNTER — Ambulatory Visit (INDEPENDENT_AMBULATORY_CARE_PROVIDER_SITE_OTHER): Payer: 59 | Admitting: *Deleted

## 2021-07-29 DIAGNOSIS — Z5181 Encounter for therapeutic drug level monitoring: Secondary | ICD-10-CM

## 2021-07-29 DIAGNOSIS — Z952 Presence of prosthetic heart valve: Secondary | ICD-10-CM

## 2021-07-29 DIAGNOSIS — Z7901 Long term (current) use of anticoagulants: Secondary | ICD-10-CM

## 2021-07-29 DIAGNOSIS — Z8673 Personal history of transient ischemic attack (TIA), and cerebral infarction without residual deficits: Secondary | ICD-10-CM | POA: Diagnosis not present

## 2021-07-29 LAB — POCT INR: INR: 2.7 (ref 2.0–3.0)

## 2021-07-29 NOTE — Patient Instructions (Signed)
Description   Continue taking warfarin 1 tablet daily.  Recheck INR in 6 weeks. Coumadin Clinic [phone].

## 2021-08-19 ENCOUNTER — Other Ambulatory Visit: Payer: Self-pay

## 2021-08-19 MED ORDER — WARFARIN SODIUM 3 MG PO TABS: ORAL_TABLET | ORAL | 3 refills | Status: DC

## 2021-09-09 ENCOUNTER — Other Ambulatory Visit: Payer: Self-pay

## 2021-09-09 ENCOUNTER — Ambulatory Visit (HOSPITAL_COMMUNITY)
Admission: EM | Admit: 2021-09-09 | Discharge: 2021-09-09 | Disposition: A | Discharge status: Home / Self Care | Payer: 59 | Attending: Emergency Medicine | Admitting: Emergency Medicine

## 2021-09-09 ENCOUNTER — Encounter (HOSPITAL_COMMUNITY): Payer: Self-pay

## 2021-09-09 DIAGNOSIS — L03115 Cellulitis of right lower limb: Secondary | ICD-10-CM | POA: Diagnosis not present

## 2021-09-09 MED ORDER — DOXYCYCLINE HYCLATE 100 MG PO CAPS: 100.0000 mg | ORAL_CAPSULE | Freq: Two times a day (BID) | ORAL | 0 refills | Status: DC

## 2021-09-09 NOTE — ED Triage Notes (Signed)
Pt presents with right foot swelling, redness, and pain X 1 week.

## 2021-09-09 NOTE — ED Provider Notes (Signed)
[address]    CSN: [id number] Arrival date & time: 09/09/21  1127      History   Chief Complaint Chief Complaint  Patient presents with   Foot Swelling    HPI JASIYAH POLAND is a 66 y.o. female.   Patient presents with right foot swelling, erythema and pain worsening over the last 5 days.  Symptoms  worse with walking.  Denies numbness, tingling, precipitating event or trauma.  Range of motion is intact .  Has attempted ice and cherry juice which has not been effective.  Past Medical History:  Diagnosis Date   Aortic stenosis    Aortic valve replacement 2006   Bacteremia 01/19/2019   CAD (coronary artery disease)    a. s/p CABG 2006 at time of AVR.   CVA (cerebral infarction)    related to thrombosed aortic root aneurysm   Dyslipidemia    Gallstones    GERD (gastroesophageal reflux disease)    Heart murmur    Hx of CABG    2006,LIMA to LAD, SVG to diagonal   Hx of transfusion of packed red blood cells    Hypertension    Hypothyroidism    Iron deficiency anemia    Lower GI bleed 06/2015   Pericardial effusion    Insignificant small pericardial effusion seen on echo, November, 2013   Peripheral vascular disease ([place]) 06   blood clot -stroke   S/P aortic valve replacement    a. Bentall procedure,#21 St. Jude mechanical valve conduit with reimplantation of the coronaries 2006   Status post colonoscopy with polypectomy    "bleeding; colonoscopy was ~ 06/18/2015"   Stroke [facility]) 2006   no deficits    Warfarin anticoagulation    coumadin therapy    Patient Active Problem List   Diagnosis Date Noted   Sepsis ([place]) 01/18/2019   Bacteremia due to methicillin susceptible Staphylococcus aureus (MSSA) 01/18/2019   Acute kidney injury ([place]) 01/18/2019   Thrombocytopenia ([place]) 01/18/2019   Chronic hepatitis C without hepatic coma ([place]) 05/04/2017   S/P repair of ventral hernia Nov 2017 05/15/2016   Ventral hernia 05/15/2016   Chronic congestive heart  failure with left ventricular diastolic dysfunction ([place]) 06/25/2015   Hypothyroidism 06/25/2015   Long term (current) use of anticoagulants 06/02/2012   CAD (coronary artery disease)    S/P aortic valve replacement    Aortic stenosis    Chronic cholecystitis with calculus 05/25/2012   Constipation, chronic 05/25/2012   Needs screening Colonoscopy - Pt refused 05/25/2012   Warfarin anticoagulation    Hypertension    Hx of CABG    History of CVA (cerebrovascular accident)    Dyslipidemia    Shortness of breath 04/16/2010    Past Surgical History:  Procedure Laterality Date   AORTIC VALVE REPLACEMENT  2006   BACK SURGERY     CARDIAC CATHETERIZATION  ~ 2006   [place]   COLONOSCOPY N/A 06/26/2015   Procedure: COLONOSCOPY;  Surgeon: Gatha Mayer, MD;  [facility] END[facility]COPY;  Service: Endoscopy;  Laterality: N/A;   COLONOSCOPY W/ BIOPSIES AND POLYPECTOMY  06/18/2015   CORONARY ARTERY BYPASS GRAFT  2006   ESOPHAGOGASTRODUODENOSCOPY  06/18/2015   INSERTION OF MESH  05/15/2016   Procedure: INSERTION OF MESH;  Surgeon: Johnathan Hausen, MD;  Location: [facility] ORS;  Service: General;;   LAPAROSCOPIC ASSISTED VENTRAL HERNIA REPAIR N/A 05/15/2016   Procedure: LAPAROSCOPIC ASSISTED VENTRAL HERNIA REPAIR WITH MESH;  Surgeon: Johnathan Hausen, MD;  Location: [facility] ORS;  Service: General;  Laterality: N/A;   LAPAROSCOPIC CHOLECYSTECTOMY SINGLE PORT  07/21/2012   Procedure: LAPAROSCOPIC CHOLECYSTECTOMY SINGLE PORT;  Surgeon: Adin Hector, MD;  Location: [place];  Service: General;  Laterality: N/A;   LUMBAR FUSION  ~ 1973   TEE WITHOUT CARDIOVERSION N/A 01/19/2019   Procedure: TRANSESOPHAGEAL ECHOCARDIOGRAM (TEE);  Surgeon: Lelon Perla, MD;  Location: [facility] ENDOSCOPY;  Service: Cardiovascular;  Laterality: N/A;   TONSILLECTOMY      OB History   No obstetric history on file.      Home Medications    Prior to Admission medications   Medication Sig Start  Date End Date Taking? Authorizing Provider  acetaminophen (TYLENOL) 500 MG tablet Take 500-1,000 mg by mouth every 6 (six) hours as needed (for pain or headaches).     [provider]  Calcium Carbonate-Vitamin D 600-400 MG-UNIT tablet Take 1 tablet by mouth 2 (two) times daily.    [provider]  furosemide (LASIX) 20 MG tablet Take 20 mg by mouth daily. 04/18/17   [provider]  levothyroxine (SYNTHROID, LEVOTHROID) 50 MCG tablet Take 50 mcg by mouth daily before breakfast.    [provider]  losartan (COZAAR) 25 MG tablet Take 25 mg by mouth daily.    [provider]  Multiple Vitamins-Minerals (PRESERVISION/LUTEIN PO) Take 1 capsule by mouth daily with breakfast.     [provider]  rosuvastatin (CRESTOR) 20 MG tablet Take 20 mg by mouth daily.    [provider]  triazolam (HALCION) 0.25 MG tablet Take 0.25 mg by mouth at bedtime.  08/12/16   [provider]  warfarin (COUMADIN) 3 MG tablet Take 1 tablet daily except 1/2 tablet on Mondays or as directed by Anticoagulation Clinic. 08/19/21   Freada Bergeron, MD    Family History Family History  Problem Relation Age of Onset   Stroke Mother    Heart disease Mother    Alcohol abuse Father    Hypertension Father    Stroke Father    Hyperlipidemia Father    Stroke Brother        she has three and at least one of them has had a stroke   Colon cancer Neg Hx    Breast cancer Neg Hx     Social History Social History   Tobacco Use   Smoking status: Former    Packs/day: 1.00    Years: 25.00    Pack years: 25.00    Types: Cigarettes    Quit date: 07/15/2004    Years since quitting: 17.1   Smokeless tobacco: Never  Vaping Use   Vaping Use: Never used  Substance Use Topics   Alcohol use: No   Drug use: No     Allergies   Patient has no known allergies.   Review of Systems Review of Systems  Constitutional: Negative.   Respiratory: Negative.     Cardiovascular: Negative.   Musculoskeletal:  Positive for arthralgias. Negative for back pain, gait problem, joint swelling, myalgias, neck pain and neck stiffness.  Skin: Negative.   Neurological: Negative.     Physical Exam Triage Vital Signs ED Triage Vitals  Enc Vitals Group     BP 09/09/21 1222 (!) 180/77     Pulse Rate 09/09/21 1222 99     Resp 09/09/21 1222 20     Temp 09/09/21 1222 (!) 97.5 F (36.4 C)     Temp Source 09/09/21  1222 Oral     SpO2 09/09/21 1222 96 %     Weight --      Height --      Head Circumference --      Peak Flow --      Pain Score 09/09/21 1223 9     Pain Loc --      Pain Edu? --      Excl. in [place]? --    No data found.  Updated Vital Signs BP (!) 180/77 (BP Location: Right Arm)    Pulse 99    Temp (!) 97.5 F (36.4 C) (Oral)    Resp 20    SpO2 96%   Visual Acuity Right Eye Distance:   Left Eye Distance:   Bilateral Distance:    Right Eye Near:   Left Eye Near:    Bilateral Near:     Physical Exam Constitutional:      Appearance: Normal appearance.  HENT:     Head: Normocephalic.  Eyes:     Extraocular Movements: Extraocular movements intact.  Pulmonary:     Effort: Pulmonary effort is normal.  Feet:     Comments: Erythema, mild tenderness and 3+ edema to the anterior right forefoot ,2+ pedal pulse, sensation intact, capillary refill less than 3, range of motion intact Skin:    General: Skin is warm and dry.  Neurological:     Mental Status: She is alert and oriented to person, place, and time. Mental status is at baseline.  Psychiatric:        Mood and Affect: Mood normal.        Behavior: Behavior normal.     UC Treatments / Results  Labs (all labs ordered are listed, but only abnormal results are displayed) Labs Reviewed - No data to display  EKG   Radiology No results found.  Procedures Procedures (including critical care time)  Medications Ordered in UC Medications - No data to display  Initial  Impression / Assessment and Plan / UC Course  I have reviewed the triage vital signs and the nursing notes.  Pertinent labs & imaging results that were available during my care of the patient were reviewed by me and considered in my medical decision making (see chart for details).  Cellulitis of the right foot  Will defer imaging due to lack of injury, low suspicion of gout or arthritic causes, etiology is most likely a soft tissue infection, discussed with patient, doxycycline 7-day course prescribed, recommended heat, compression and elevation for further supportive care, patient demonstrated precautions that if no improvement seen within 3 days she is to follow-up with PCP or urgent care and that if symptoms have not cleared after use of medication she is to follow-up as well Final Clinical Impressions(s) / UC Diagnoses   Final diagnoses:  None   Discharge Instructions   None    ED Prescriptions   None    PDMP not reviewed this encounter.   Hans Eden, NP 09/09/21 1344

## 2021-09-09 NOTE — Discharge Instructions (Signed)
Today you are being treated for an infection of the soft tissues of the skin  Take doxycycline twice a day for the next 7 days  You may apply heat to the affected area and 15-minute intervals to help reduce swelling and for comfort  You may wrap the area with compression or use a compression stocking to help reduce swelling  While in a sitting and lying position you may elevate extremity  If you do not not see any improvement in the next 3 days after use of medication please follow-up with your primary care doctor with urgent care for reevaluation, if you still have concerns about your foot after completion of medication, please follow-up with urgent care or with primary care, if all symptoms have cleared you do not need follow-up

## 2021-09-10 ENCOUNTER — Ambulatory Visit: Payer: 59 | Admitting: *Deleted

## 2021-09-10 DIAGNOSIS — Z952 Presence of prosthetic heart valve: Secondary | ICD-10-CM | POA: Diagnosis not present

## 2021-09-10 DIAGNOSIS — Z5181 Encounter for therapeutic drug level monitoring: Secondary | ICD-10-CM

## 2021-09-10 DIAGNOSIS — Z8673 Personal history of transient ischemic attack (TIA), and cerebral infarction without residual deficits: Secondary | ICD-10-CM

## 2021-09-10 DIAGNOSIS — Z7901 Long term (current) use of anticoagulants: Secondary | ICD-10-CM

## 2021-09-10 LAB — POCT INR: INR: 3.7 — AB (ref 2.0–3.0)

## 2021-09-10 NOTE — Patient Instructions (Signed)
Description   ?Do not take any Warfarin today and keep normal dose of 1 tablet daily except this Saturday take 1/2 tablet, then resume taking warfarin 1 tablet daily. Recheck INR in 3 weeks (normally 6 weeks). Coumadin Clinic [phone].  ?  ?  ?

## 2021-09-15 ENCOUNTER — Ambulatory Visit (HOSPITAL_COMMUNITY)
Admission: EM | Admit: 2021-09-15 | Discharge: 2021-09-15 | Disposition: A | Discharge status: Home / Self Care | Payer: 59 | Attending: Sports Medicine | Admitting: Sports Medicine

## 2021-09-15 ENCOUNTER — Encounter (HOSPITAL_COMMUNITY): Payer: Self-pay | Admitting: *Deleted

## 2021-09-15 ENCOUNTER — Other Ambulatory Visit: Payer: Self-pay

## 2021-09-15 DIAGNOSIS — M7989 Other specified soft tissue disorders: Secondary | ICD-10-CM

## 2021-09-15 DIAGNOSIS — M79671 Pain in right foot: Secondary | ICD-10-CM | POA: Diagnosis not present

## 2021-09-15 DIAGNOSIS — L03115 Cellulitis of right lower limb: Secondary | ICD-10-CM | POA: Diagnosis not present

## 2021-09-15 MED ORDER — COLCHICINE 0.6 MG PO TABS: 0.6000 mg | ORAL_TABLET | Freq: Every day | ORAL | 0 refills | Status: AC

## 2021-09-15 MED ORDER — DOXYCYCLINE HYCLATE 100 MG PO CAPS: 100.0000 mg | ORAL_CAPSULE | Freq: Two times a day (BID) | ORAL | 0 refills | Status: AC

## 2021-09-15 NOTE — ED Provider Notes (Signed)
[place]    CSN: [id number] Arrival date & time: 09/15/21  1107      History   Chief Complaint Chief Complaint  Patient presents with   Foot Pain    Rt    HPI Elizabeth Morton is a 66 y.o. female who presents with right foot pain and swelling.   Foot Pain  Patient was previously evaluated in the urgent care on 09/09/21 in which she present with right foot swelling, erythema and pain.  There is no injury at that time and she was diagnosed with cellulitis of the right foot.  She was placed on a 7 day course of doxycycline.  The patient presents today because she has had a reaccumulation of her swelling and some pain. She has one more dose left of her doxycycline.  She states the redness and warmth to the foot has gotten significantly better.  She did unfortunately have to attend a funeral yesterday and so needed to to do more walking yesterday, and this caused her swelling to reaccumulate.  She still has some pain, although it is slightly better than her previous visit.  Past Medical History:  Diagnosis Date   Aortic stenosis    Aortic valve replacement 2006   Bacteremia 01/19/2019   CAD (coronary artery disease)    a. s/p CABG 2006 at time of AVR.   CVA (cerebral infarction)    related to thrombosed aortic root aneurysm   Dyslipidemia    Gallstones    GERD (gastroesophageal reflux disease)    Heart murmur    Hx of CABG    2006,LIMA to LAD, SVG to diagonal   Hx of transfusion of packed red blood cells    Hypertension    Hypothyroidism    Iron deficiency anemia    Lower GI bleed 06/2015   Pericardial effusion    Insignificant small pericardial effusion seen on echo, November, 2013   Peripheral vascular disease ([place]) 06   blood clot -stroke   S/P aortic valve replacement    a. Bentall procedure,#21 St. Jude mechanical valve conduit with reimplantation of the coronaries 2006   Status post colonoscopy with polypectomy    "bleeding; colonoscopy was ~ 06/18/2015"    Stroke [facility]) 2006   no deficits    Warfarin anticoagulation    coumadin therapy    Patient Active Problem List   Diagnosis Date Noted   Sepsis ([place]) 01/18/2019   Bacteremia due to methicillin susceptible Staphylococcus aureus (MSSA) 01/18/2019   Acute kidney injury ([place]) 01/18/2019   Thrombocytopenia ([place]) 01/18/2019   Chronic hepatitis C without hepatic coma ([place]) 05/04/2017   S/P repair of ventral hernia Nov 2017 05/15/2016   Ventral hernia 05/15/2016   Chronic congestive heart failure with left ventricular diastolic dysfunction ([place]) 06/25/2015   Hypothyroidism 06/25/2015   Long term (current) use of anticoagulants 06/02/2012   CAD (coronary artery disease)    S/P aortic valve replacement    Aortic stenosis    Chronic cholecystitis with calculus 05/25/2012   Constipation, chronic 05/25/2012   Needs screening Colonoscopy - Pt refused 05/25/2012   Warfarin anticoagulation    Hypertension    Hx of CABG    History of CVA (cerebrovascular accident)    Dyslipidemia    Shortness of breath 04/16/2010    Past Surgical History:  Procedure Laterality Date   AORTIC VALVE REPLACEMENT  2006   BACK SURGERY     CARDIAC CATHETERIZATION  ~ 2006   CARDIAC VALVE REPLACEMENT  [place]   COLONOSCOPY N/A 06/26/2015   Procedure: COLONOSCOPY;  Surgeon: Gatha Mayer, MD;  Location: [place];  Service: Endoscopy;  Laterality: N/A;   COLONOSCOPY W/ BIOPSIES AND POLYPECTOMY  06/18/2015   CORONARY ARTERY BYPASS GRAFT  2006   ESOPHAGOGASTRODUODENOSCOPY  06/18/2015   INSERTION OF MESH  05/15/2016   Procedure: INSERTION OF MESH;  Surgeon: Johnathan Hausen, MD;  [facility];  Service: General;;   LAPAROSCOPIC ASSISTED VENTRAL HERNIA REPAIR N/A 05/15/2016   Procedure: LAPAROSCOPIC ASSISTED VENTRAL HERNIA REPAIR WITH MESH;  Surgeon: Johnathan Hausen, MD;  [facility];  Service: General;  Laterality: N/A;   LAPAROSCOPIC CHOLECYSTECTOMY SINGLE PORT  07/21/2012   Procedure:  LAPAROSCOPIC CHOLECYSTECTOMY SINGLE PORT;  Surgeon: Adin Hector, MD;  Location: [place];  Service: General;  Laterality: N/A;   LUMBAR FUSION  ~ 1973   TEE WITHOUT CARDIOVERSION N/A 01/19/2019   Procedure: TRANSESOPHAGEAL ECHOCARDIOGRAM (TEE);  Surgeon: Lelon Perla, MD;  Location: [facility] ENDOSCOPY;  Service: Cardiovascular;  Laterality: N/A;   TONSILLECTOMY      OB History   No obstetric history on file.      Home Medications    Prior to Admission medications   Medication Sig Start Date End Date Taking? Authorizing Provider  colchicine 0.6 MG tablet Take 1 tablet (0.6 mg total) by mouth daily for 3 doses. Take 2 tablets right away, take the 2nd dose 1.5 hours later 09/15/21 09/18/21 Yes Elba Barman, DO  acetaminophen (TYLENOL) 500 MG tablet Take 500-1,000 mg by mouth every 6 (six) hours as needed (for pain or headaches).     [provider]  Calcium Carbonate-Vitamin D 600-400 MG-UNIT tablet Take 1 tablet by mouth 2 (two) times daily.    [provider]  doxycycline (VIBRAMYCIN) 100 MG capsule Take 1 capsule (100 mg total) by mouth 2 (two) times daily for 3 days. 09/15/21 09/18/21  Elba Barman, DO  furosemide (LASIX) 20 MG tablet Take 20 mg by mouth daily. 04/18/17   [provider]  levothyroxine (SYNTHROID, LEVOTHROID) 50 MCG tablet Take 50 mcg by mouth daily before breakfast.    [provider]  losartan (COZAAR) 25 MG tablet Take 25 mg by mouth daily.    [provider]  Multiple Vitamins-Minerals (PRESERVISION/LUTEIN PO) Take 1 capsule by mouth daily with breakfast.     [provider]  rosuvastatin (CRESTOR) 20 MG tablet Take 20 mg by mouth daily.    [provider]  triazolam (HALCION) 0.25 MG tablet Take 0.25 mg by mouth at bedtime.  08/12/16   [provider]  warfarin (COUMADIN) 3 MG tablet Take 1 tablet daily except 1/2 tablet on Mondays or as directed by Anticoagulation Clinic. 08/19/21   Freada Bergeron, MD     Family History Family History  Problem Relation Age of Onset   Stroke Mother    Heart disease Mother    Alcohol abuse Father    Hypertension Father    Stroke Father    Hyperlipidemia Father    Stroke Brother        she has three and at least one of them has had a stroke   Colon cancer Neg Hx    Breast cancer Neg Hx     Social History Social History   Tobacco Use   Smoking status: Former    Packs/day: 1.00    Years: 25.00    Pack years: 25.00    Types: Cigarettes    Quit  date: 07/15/2004    Years since quitting: 17.1   Smokeless tobacco: Never  Vaping Use   Vaping Use: Never used  Substance Use Topics   Alcohol use: No   Drug use: No     Allergies   Patient has no known allergies.   Review of Systems Review of Systems   Physical Exam Triage Vital Signs ED Triage Vitals  Enc Vitals Group     BP 09/15/21 1232 133/83     Pulse Rate 09/15/21 1232 87     Resp 09/15/21 1232 20     Temp 09/15/21 1232 98.7 F (37.1 C)     Temp src --      SpO2 09/15/21 1232 98 %     Weight --      Height --      Head Circumference --      Peak Flow --      Pain Score 09/15/21 1230 0     Pain Loc --      Pain Edu? --      Excl. in [place]? --    No data found.  Updated Vital Signs BP 133/83    Pulse 87    Temp 98.7 F (37.1 C)    Resp 20    SpO2 98%   Physical Exam Gen: Well-appearing, in no acute distress; non-toxic CV: Regular Rate. Well-perfused. Warm.  Resp: Breathing unlabored on room air; no wheezing. Psych: Fluid speech in conversation; appropriate affect; normal thought process Neuro: Sensation intact throughout. No gross coordination deficits.  MSK:  - Right foot: Inspection the does yield quite notable soft tissue swelling, pitting in nature of the right dorsal and lateral foot.  There is a mild degree of erythema, although no warmth over the area.  The patient has some generalized tenderness over the soft tissue swelling, although no bony tenderness over  either malleoli, navicular or base of the fifth metatarsal.  She has mild pain with dorsiflexion and plantar flexion of the foot.  She is able to weight-bear without too much difficulty or pain.  Neurovascularly intact distally, cap refill less than 2 seconds.    UC Treatments / Results  Labs (all labs ordered are listed, but only abnormal results are displayed) Labs Reviewed - No data to display  EKG   Radiology No results found.  Procedures Procedures (including critical care time)  Medications Ordered in UC Medications - No data to display  Initial Impression / Assessment and Plan / UC Course  I have reviewed the triage vital signs and the nursing notes.  Pertinent labs & imaging results that were available during my care of the patient were reviewed by me and considered in my medical decision making (see chart for details).     Right foot pain Cellulitis of right foot Swelling of right foot  Patient presents for follow up in the urgent care with right foot swelling and previous cellulitis.  She is on day 7 of her doxycycline antibiotic and does report improvement in redness and warmth.  She had reaccumulation of her soft tissue swelling yesterday after being on her feet at a funeral.  I did perform bedside ultrasound of the foot which showed all the soft tissue swelling, there is no joint effusion noted.  Discussed there may be an inflammatory component, although patient denies any previous history of gout.  Given the swelling and residual redness, will extend the doxycycline course an additional 3 days, for a total of 10 day course.  We will prescribe 3 tablets of colchicine to be taken today to help with the inflammatory component.  Cautioned patient on avoiding continued NSAIDs given her history of CAD and anticoagulant therapy. Did apply a Ace wrap to the ankle, although educated patient on obtaining a compressive helix ankle sleeve to help with the swelling.  Discussed  elevating the ankle throughout the day.  Icing the ankle 15-20 minutes 3 times a day.  Strict return precautions were provided if pain, redness or warmth re-accumulates.  I do think this is best that she follows up with her primary care physician or the local sports medicine Center if this does not improve over the next few days.  Patient is agreeable to this plan.  Final Clinical Impressions(s) / UC Diagnoses   Final diagnoses:  Cellulitis of right lower extremity  Foot pain, right  Swelling of right foot     Discharge Instructions      Things for you to do:  -Find a compression ankle sleeve, wear this throughout the day.  Elevate the ankle above your heart to help with swelling.  You may take off the compression ankle sleeve when sleeping. -Ice the ankle for 15-20 minutes 3 times a day -You will continue your doxycycline antibiotic for 3 more days, take it twice daily  -For the inflammation we will proceed with colchicine --> take 2 tablets once you pick up the prescription, then 1/3 tablet about 1.5 hours later  -If the swelling and/or redness continues, would recommend following up with your PCP or sports medicine center      ED Prescriptions     Medication Sig Dispense Auth. Provider   doxycycline (VIBRAMYCIN) 100 MG capsule Take 1 capsule (100 mg total) by mouth 2 (two) times daily for 3 days. 6 capsule Elba Barman, DO   colchicine 0.6 MG tablet Take 1 tablet (0.6 mg total) by mouth daily for 3 doses. Take 2 tablets right away, take the 2nd dose 1.5 hours later 3 tablet Elba Barman, DO      PDMP not reviewed this encounter.   Elba Barman, [place] 09/15/21 [phone]

## 2021-09-15 NOTE — Discharge Instructions (Signed)
Things for you to do: ? ?-Find a compression ankle sleeve, wear this throughout the day.  Elevate the ankle above your heart to help with swelling.  You may take off the compression ankle sleeve when sleeping. ?-Ice the ankle for 15-20 minutes 3 times a day ?-You will continue your doxycycline antibiotic for 3 more days, take it twice daily ? ?-For the inflammation we will proceed with colchicine --> take 2 tablets once you pick up the prescription, then 1/3 tablet about 1.5 hours later ? ?-If the swelling and/or redness continues, would recommend following up with your PCP or sports medicine center ? ?

## 2021-09-15 NOTE — ED Triage Notes (Signed)
Pt returns today with ongoing Rt foot problems. Last seen 2-28 with swelling and pain. ?

## 2021-10-01 ENCOUNTER — Ambulatory Visit (INDEPENDENT_AMBULATORY_CARE_PROVIDER_SITE_OTHER): Payer: 59 | Admitting: *Deleted

## 2021-10-01 ENCOUNTER — Other Ambulatory Visit: Payer: Self-pay

## 2021-10-01 DIAGNOSIS — Z5181 Encounter for therapeutic drug level monitoring: Secondary | ICD-10-CM

## 2021-10-01 DIAGNOSIS — Z8673 Personal history of transient ischemic attack (TIA), and cerebral infarction without residual deficits: Secondary | ICD-10-CM | POA: Diagnosis not present

## 2021-10-01 DIAGNOSIS — Z952 Presence of prosthetic heart valve: Secondary | ICD-10-CM

## 2021-10-01 DIAGNOSIS — Z7901 Long term (current) use of anticoagulants: Secondary | ICD-10-CM

## 2021-10-01 LAB — POCT INR: INR: 3.3 — AB (ref 2.0–3.0)

## 2021-10-01 NOTE — Patient Instructions (Signed)
Description   ?Do not take any Warfarin today then resume taking warfarin 1 tablet daily. Recheck INR in 3 weeks (normally 6 weeks). Coumadin Clinic [phone].  ?  ?  ?

## 2021-10-13 ENCOUNTER — Ambulatory Visit
Admission: RE | Admit: 2021-10-13 | Discharge: 2021-10-13 | Disposition: A | Discharge status: Home / Self Care | Payer: 59 | Source: Ambulatory Visit | Attending: Internal Medicine | Admitting: Internal Medicine

## 2021-10-13 ENCOUNTER — Ambulatory Visit (INDEPENDENT_AMBULATORY_CARE_PROVIDER_SITE_OTHER): Payer: 59 | Admitting: *Deleted

## 2021-10-13 DIAGNOSIS — Z7901 Long term (current) use of anticoagulants: Secondary | ICD-10-CM

## 2021-10-13 DIAGNOSIS — Z8673 Personal history of transient ischemic attack (TIA), and cerebral infarction without residual deficits: Secondary | ICD-10-CM

## 2021-10-13 DIAGNOSIS — Z5181 Encounter for therapeutic drug level monitoring: Secondary | ICD-10-CM

## 2021-10-13 DIAGNOSIS — M810 Age-related osteoporosis without current pathological fracture: Secondary | ICD-10-CM

## 2021-10-13 DIAGNOSIS — Z952 Presence of prosthetic heart valve: Secondary | ICD-10-CM

## 2021-10-13 LAB — POCT INR: INR: 3.5 — AB (ref 2.0–3.0)

## 2021-10-13 NOTE — Patient Instructions (Signed)
Description   ?Do not take any Warfarin today then start taking warfarin 1 tablet daily except 1/2 tablet on Sundays. Recheck INR in 1 week. Coumadin Clinic [phone].  ?  ?  ?

## 2021-10-22 ENCOUNTER — Ambulatory Visit (INDEPENDENT_AMBULATORY_CARE_PROVIDER_SITE_OTHER): Payer: 59

## 2021-10-22 DIAGNOSIS — Z5181 Encounter for therapeutic drug level monitoring: Secondary | ICD-10-CM | POA: Diagnosis not present

## 2021-10-22 DIAGNOSIS — Z7901 Long term (current) use of anticoagulants: Secondary | ICD-10-CM

## 2021-10-22 DIAGNOSIS — Z952 Presence of prosthetic heart valve: Secondary | ICD-10-CM

## 2021-10-22 DIAGNOSIS — Z8673 Personal history of transient ischemic attack (TIA), and cerebral infarction without residual deficits: Secondary | ICD-10-CM

## 2021-10-22 LAB — POCT INR: INR: 2.2 (ref 2.0–3.0)

## 2021-10-22 NOTE — Patient Instructions (Signed)
Description   ?Continue on same dosage of Warfarin 1 tablet daily except 1/2 tablet on Sundays. Recheck INR in 2 weeks. Coumadin Clinic [phone].  ?  ?  ?

## 2021-11-05 ENCOUNTER — Ambulatory Visit (INDEPENDENT_AMBULATORY_CARE_PROVIDER_SITE_OTHER): Payer: 59 | Admitting: *Deleted

## 2021-11-05 DIAGNOSIS — Z8673 Personal history of transient ischemic attack (TIA), and cerebral infarction without residual deficits: Secondary | ICD-10-CM | POA: Diagnosis not present

## 2021-11-05 DIAGNOSIS — Z952 Presence of prosthetic heart valve: Secondary | ICD-10-CM | POA: Diagnosis not present

## 2021-11-05 DIAGNOSIS — Z7901 Long term (current) use of anticoagulants: Secondary | ICD-10-CM | POA: Diagnosis not present

## 2021-11-05 DIAGNOSIS — Z5181 Encounter for therapeutic drug level monitoring: Secondary | ICD-10-CM

## 2021-11-05 LAB — POCT INR: INR: 2.8 (ref 2.0–3.0)

## 2021-11-05 NOTE — Patient Instructions (Signed)
Description   ?Continue taking Warfarin 1 tablet daily except 1/2 tablet on Sundays. Recheck INR in 5 weeks. Coumadin Clinic [phone].  ?  ?  ?

## 2021-12-10 ENCOUNTER — Ambulatory Visit (INDEPENDENT_AMBULATORY_CARE_PROVIDER_SITE_OTHER): Payer: 59

## 2021-12-10 ENCOUNTER — Other Ambulatory Visit: Payer: Self-pay | Admitting: *Deleted

## 2021-12-10 DIAGNOSIS — Z7901 Long term (current) use of anticoagulants: Secondary | ICD-10-CM | POA: Diagnosis not present

## 2021-12-10 DIAGNOSIS — Z952 Presence of prosthetic heart valve: Secondary | ICD-10-CM | POA: Diagnosis not present

## 2021-12-10 DIAGNOSIS — Z8673 Personal history of transient ischemic attack (TIA), and cerebral infarction without residual deficits: Secondary | ICD-10-CM | POA: Diagnosis not present

## 2021-12-10 DIAGNOSIS — Z5181 Encounter for therapeutic drug level monitoring: Secondary | ICD-10-CM

## 2021-12-10 LAB — POCT INR: INR: 2.3 (ref 2.0–3.0)

## 2021-12-10 MED ORDER — WARFARIN SODIUM 3 MG PO TABS
ORAL_TABLET | ORAL | 1 refills | Status: DC
Start: 2021-12-10 — End: 2022-01-14

## 2021-12-10 NOTE — Patient Instructions (Signed)
Description   Continue taking Warfarin 1 tablet daily except 1/2 tablet on Sundays. Recheck INR in 6 weeks. Coumadin Clinic [phone].       

## 2021-12-10 NOTE — Telephone Encounter (Signed)
Prescription refill request received for warfarin [place]: [place] 01/27/2021 Next INR check: 5/31 Warfarin tablet strength: '3mg'$ 

## 2022-01-14 ENCOUNTER — Other Ambulatory Visit: Payer: Self-pay | Admitting: *Deleted

## 2022-01-14 DIAGNOSIS — Z7901 Long term (current) use of anticoagulants: Secondary | ICD-10-CM

## 2022-01-14 MED ORDER — WARFARIN SODIUM 3 MG PO TABS: ORAL_TABLET | ORAL | 3 refills | Status: DC

## 2022-01-21 ENCOUNTER — Ambulatory Visit (INDEPENDENT_AMBULATORY_CARE_PROVIDER_SITE_OTHER): Payer: 59 | Admitting: *Deleted

## 2022-01-21 DIAGNOSIS — Z7901 Long term (current) use of anticoagulants: Secondary | ICD-10-CM

## 2022-01-21 DIAGNOSIS — Z952 Presence of prosthetic heart valve: Secondary | ICD-10-CM | POA: Diagnosis not present

## 2022-01-21 DIAGNOSIS — Z8673 Personal history of transient ischemic attack (TIA), and cerebral infarction without residual deficits: Secondary | ICD-10-CM

## 2022-01-21 DIAGNOSIS — Z5181 Encounter for therapeutic drug level monitoring: Secondary | ICD-10-CM | POA: Diagnosis not present

## 2022-01-21 LAB — POCT INR: INR: 2.3 (ref 2.0–3.0)

## 2022-01-21 NOTE — Patient Instructions (Signed)
Description   Continue taking Warfarin 1 tablet daily except 1/2 tablet on Sundays. Recheck INR in 6 weeks. Coumadin Clinic [phone].       

## 2022-03-04 ENCOUNTER — Ambulatory Visit (INDEPENDENT_AMBULATORY_CARE_PROVIDER_SITE_OTHER): Payer: 59 | Admitting: *Deleted

## 2022-03-04 DIAGNOSIS — Z7901 Long term (current) use of anticoagulants: Secondary | ICD-10-CM

## 2022-03-04 DIAGNOSIS — Z952 Presence of prosthetic heart valve: Secondary | ICD-10-CM | POA: Diagnosis not present

## 2022-03-04 DIAGNOSIS — Z5181 Encounter for therapeutic drug level monitoring: Secondary | ICD-10-CM

## 2022-03-04 DIAGNOSIS — Z8673 Personal history of transient ischemic attack (TIA), and cerebral infarction without residual deficits: Secondary | ICD-10-CM

## 2022-03-04 LAB — POCT INR: INR: 3.1 — AB (ref 2.0–3.0)

## 2022-03-04 NOTE — Patient Instructions (Signed)
Description   Today take 1/2 tablet then continue taking Warfarin 1 tablet daily except 1/2 tablet on Sundays. Recheck INR in 6 weeks. Coumadin Clinic [phone].

## 2022-04-15 ENCOUNTER — Ambulatory Visit: Payer: Medicare Other | Attending: Cardiology | Admitting: *Deleted

## 2022-04-15 DIAGNOSIS — Z952 Presence of prosthetic heart valve: Secondary | ICD-10-CM

## 2022-04-15 DIAGNOSIS — Z8673 Personal history of transient ischemic attack (TIA), and cerebral infarction without residual deficits: Secondary | ICD-10-CM

## 2022-04-15 DIAGNOSIS — Z7901 Long term (current) use of anticoagulants: Secondary | ICD-10-CM | POA: Diagnosis not present

## 2022-04-15 DIAGNOSIS — Z5181 Encounter for therapeutic drug level monitoring: Secondary | ICD-10-CM

## 2022-04-15 LAB — POCT INR: INR: 2.8 (ref 2.0–3.0)

## 2022-04-15 NOTE — Patient Instructions (Signed)
Description   Continue taking Warfarin 1 tablet daily except 1/2 tablet on Sundays. Recheck INR in 6 weeks. Coumadin Clinic [phone].       

## 2022-05-13 ENCOUNTER — Ambulatory Visit (HOSPITAL_COMMUNITY)
Admission: RE | Admit: 2022-05-13 | Discharge: 2022-05-13 | Disposition: A | Discharge status: Home / Self Care | Payer: Medicare Other | Source: Ambulatory Visit | Attending: Vascular Surgery | Admitting: Vascular Surgery

## 2022-05-13 ENCOUNTER — Other Ambulatory Visit (HOSPITAL_COMMUNITY): Payer: Self-pay | Admitting: Internal Medicine

## 2022-05-13 DIAGNOSIS — I739 Peripheral vascular disease, unspecified: Secondary | ICD-10-CM | POA: Diagnosis not present

## 2022-05-25 NOTE — Progress Notes (Unsigned)
Cardiology Office Note:    Date:  05/25/2022   ID:  Elizabeth Morton, DOB 1955/11/14, MRN [id number]  PCP:  Michael Boston, MD   [facility] Providers Cardiologist:  Freada Bergeron, MD {   Referring MD: Michael Boston, MD    History of Present Illness:    Elizabeth Morton is a 66 y.o. 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 Meda Coffee who now reutrns to clinic for follow-up.  Per review of the record, the patient was hospitalized in 05/2016 for elective ventral hernia repair that was complicated by acute blood loss with significant rectal sheath hematoma. These happen when the patient was being bridged with Lovenox. Last echo 02/2017 showed LVEF of 60-65%, grade 1 DD, normal functioning aortic valve with mean gradient of 29mg Hg from 41 mm Hg.    She was admitted in July 2020 with weakness fever with elevated lactic acid. Blood culture growing GPCs, BCID MSSA. Cardiology was involved for elevated Hs- troponin 535>>679 in setting of bacteremia.  She underwent TEE that rule out any vegetation on her prosthetic aortic valve.  Was last seen in clinic on 01/2021 where she was doing well from a CV standpoint. TTE 02/2021 with LVEF 60-65%, moderate LAE, 260mSt Jude mechanical AVR with normal function and no AR with mean gradient 73m63m.  Today, ***  Past Medical History:  Diagnosis Date   Aortic stenosis    Aortic valve replacement 2006   Bacteremia 01/19/2019   CAD (coronary artery disease)    a. s/p CABG 2006 at time of AVR.   CVA (cerebral infarction)    related to thrombosed aortic root aneurysm   Dyslipidemia    Gallstones    GERD (gastroesophageal  reflux disease)    Heart murmur    Hx of CABG    2006,LIMA to LAD, SVG to diagonal   Hx of transfusion of packed red blood cells    Hypertension    Hypothyroidism    Iron deficiency anemia    Lower GI bleed 06/2015   Pericardial effusion    Insignificant small pericardial effusion seen on echo, November, 2013   [place]ipheral vascular [place] ([place]   blood clot -stroke   S/P aortic valve replacement    a. Bentall procedure,#21 St. Jude mechanical valve conduit with reimplantation of the coronaries 2006   Status post colonoscopy with polypectomy    "bleeding; colonoscopy was ~ 06/18/2015"   [facility]   no deficits    Warfarin anticoagulation    coumadin therapy    Past Surgical History:  Procedure Laterality Date   AORTIC VALVE REPLACEMENT  2006   BACK SURGERY     [place]IAC CATHETERIZATION  ~ 2006   [place]   Procedure: COLONOSCOPY;  Surgeon: CarGatha MayerD;  [facility];  Laterality: N/A;   COLONOSCOPY W/ BIOPSIES AND POLYPECTOMY  06/18/2015   CORONARY ARTERY BYPASS GRAFT  2006   ESOPHAGOGASTRODUODENOSCOPY  06/18/2015   INSERTION OF MESH  05/15/2016   Procedure: INSERTION OF MESH;  Surgeon: MatJohnathan HausenD;  Location: [facility];  Service: General;;   LAPAROSCOPIC ASSISTED VENTRAL HERNIA REPAIR N/A 05/15/2016   Procedure: LAPAROSCOPIC ASSISTED VENTRAL HERNIA REPAIR WITH MESH;  Surgeon: Johnathan Hausen, MD;  Location: [facility];  Service: General;  Laterality: N/A;   LAPAROSCOPIC CHOLECYSTECTOMY SINGLE PORT  07/21/2012   Procedure: LAPAROSCOPIC CHOLECYSTECTOMY SINGLE PORT;  Surgeon: Adin Hector, MD;  Location: [place];  Service: General;  Laterality: N/A;   LUMBAR FUSION  ~ 1973   TEE WITHOUT CARDIOVERSION N/A 01/19/2019   Procedure: TRANSESOPHAGEAL ECHOCARDIOGRAM (TEE);  Surgeon: Lelon Perla, MD;  Location: [facility] ENDOSCOPY;  Service: Cardiovascular;  Laterality: N/A;    TONSILLECTOMY      Current Medications: No outpatient medications have been marked as taking for the 05/28/22 encounter (Appointment) with Freada Bergeron, MD.     Allergies:   Patient has no known allergies.   Social History   Socioeconomic History   Marital status: Married    Spouse name: Not on file   Number of children: 1   Years of education: Not on file   Highest education level: Not on file  Occupational History   Occupation: homemaker  Tobacco Use   Smoking status: Former    Packs/day: 1.00    Years: 25.00    Total pack years: 25.00    Types: Cigarettes    Quit date: 07/15/2004    Years since quitting: 17.8   Smokeless tobacco: Never  Vaping Use   Vaping Use: Never used  Substance and Sexual Activity   Alcohol use: No   Drug use: No   Sexual activity: Not Currently  Other Topics Concern   Not on file  Social History Narrative   She lives in [place] with her husband and son. She previously smoked 37-pack-years,but quit following her stroke in April of 2006. She denies any alcohol or drugs. She has not been routinely exercising.   Social Determinants of Health   Financial Resource Strain: Not on file  Food Insecurity: Not on file  Transportation Needs: Not on file  Physical Activity: Not on file  Stress: Not on file  Social Connections: Not on file     Family History: The patient's family history includes Alcohol abuse in her father; Heart disease in her mother; Hyperlipidemia in her father; Hypertension in her father; Stroke in her brother, father, and mother. There is no history of Colon cancer or Breast cancer.  ROS:   Please see the history of present illness.    Review of Systems  Constitutional:  Negative for chills and fever.  HENT:  Negative for hearing loss.   Eyes:  Negative for blurred vision.  Respiratory:  Negative for shortness of breath.   Cardiovascular:  Negative for chest pain, palpitations, orthopnea, claudication, leg swelling  and PND.  Gastrointestinal:  Negative for blood in stool, melena, nausea and vomiting.  Genitourinary:  Negative for hematuria.  Musculoskeletal:  Negative for falls.  Neurological:  Negative for dizziness and loss of consciousness.  Psychiatric/Behavioral:  Negative for substance abuse.      EKGs/Labs/Other Studies Reviewed:    The following studies were reviewed today: TTE 03-04-21: IMPRESSIONS     1. Left ventricular ejection fraction, by estimation, is 60 to 65%. The  left ventricle has normal function. The left ventricle has no regional  wall motion abnormalities. Left ventricular diastolic parameters were  normal.   2. Right ventricular systolic function is normal. The right ventricular  size is normal.   3. Left atrial size was moderately dilated.  4. The pericardial effusion is posterior to the left ventricle.   5. The mitral valve is abnormal. Trivial mitral valve regurgitation. No  evidence of mitral stenosis.   6. Post AVR with 21 mm St Jude mechanical valve gradients have decreased  since echo done 01/18/19 normal function no AR . The aortic valve has been  repaired/replaced. Aortic valve regurgitation is not visualized. No aortic  stenosis is present. There is a  21 mm St. Jude mechanical valve present in the aortic position. Procedure  Date: 2006.   7. The inferior vena cava is normal in size with greater than 50%  respiratory variability, suggesting right atrial pressure of 3 mmHg.   Comparison(s): 01/18/19 EF 60-65% AV 72mHg peak PG, 155mg mean PG.  TTE: 01/2019  1. The left ventricle has normal systolic function with an ejection  fraction of 60-65%. The cavity size was normal. There is mild asymmetric  left ventricular hypertrophy. Left ventricular diastolic Doppler  parameters are consistent with impaired  relaxation. Elevated left ventricular end-diastolic pressure No evidence  of left ventricular regional wall motion abnormalities.   2. The right ventricle  has normal systolic function. The cavity was  normal. There is no increase in right ventricular wall thickness. Right  ventricular systolic pressure could not be assessed.   3. S/P mechanical AVR. Valve prosthesis not well visualized. The mean AVG  is 1470m, AVA 1.25cm2 and dimensionless index normal at 0.5. No  perivalvular AI noted.   4. The interatrial septum appears to be lipomatous.   5. Trivial pericardial effusion is present   6. The pericardial effusion is posterior to the left ventricle.   7. Compared to echo 2018, the mean AVG has decreased from 15m86mto  14mm47m EKG:  EKG is  ordered today.  The ekg ordered today demonstrates NSR HR 76  Recent Labs: No results found for requested labs within last 365 days.  Recent Lipid Panel No results found for: "CHOL", "TRIG", "HDL", "CHOLHDL", "VLDL", "LDLCALC", "LDLDIRECT"   Risk Assessment/Calculations:           Physical Exam:    VS:  There were no vitals taken for this visit.    Wt Readings from Last 3 Encounters:  01/27/21 165 lb 3.2 oz (74.9 kg)  01/03/20 160 lb (72.6 kg)  01/21/19 159 lb 2.8 oz (72.2 kg)     GEN:  Well nourished, well developed in no acute distress HEENT: Normal NECK: No JVD; No carotid bruits LYMPHATICS: No lymphadenopathy CARDIAC: RRR, crisp mechanical AoV click with 2/6 systolic murmur. No rubs or gallops RESPIRATORY:  Clear to auscultation without rales, wheezing or rhonchi  ABDOMEN: Soft, non-tender, non-distended MUSCULOSKELETAL:  No edema; No deformity  SKIN: Warm and dry NEUROLOGIC:  Alert and oriented x 3 PSYCHIATRIC:  Normal affect   ASSESSMENT:    No diagnosis found.  PLAN:    In order of problems listed above:  #Aortic Stenosis s/p Mechanical AVR:  TTE 02/2021 with LVEF 60-65%, mean gradient 7mmHg75mo AI. -Continue warfarin for AC; CANNOT TOLERATE LOVENOX bridge; will need heparin gtt for bridging for future procedures -SBE ppx  #CAD s/p CABG in 2006: Doing well with no  anginal symptoms. -Not on ASA due to need for warfarin -Continue crestor '20mg'$  daily -Continue losartan '25mg'$  daily  #Moderate MR>Trivial: Improved to trivial MR on TTE 02/2021  #HTN: Blood pressure well controlled at home. -Continue losartan '25mg'$  daily  #HLD: -Continue crestor '20mg'$  daily -Goal LDL <70 given known CAD -Cholesterol  monitored by PCP     Medication Adjustments/Labs and Tests Ordered: Current medicines are reviewed at length with the patient today.  Concerns regarding medicines are outlined above.  No orders of the defined types were placed in this encounter.  No orders of the defined types were placed in this encounter.   There are no Patient Instructions on file for this visit.    Signed, Freada Bergeron, MD  05/25/2022 2:23 PM    [place]

## 2022-05-27 ENCOUNTER — Ambulatory Visit: Payer: Medicare Other | Attending: Internal Medicine | Admitting: *Deleted

## 2022-05-27 DIAGNOSIS — Z5181 Encounter for therapeutic drug level monitoring: Secondary | ICD-10-CM | POA: Diagnosis not present

## 2022-05-27 DIAGNOSIS — Z952 Presence of prosthetic heart valve: Secondary | ICD-10-CM

## 2022-05-27 DIAGNOSIS — Z7901 Long term (current) use of anticoagulants: Secondary | ICD-10-CM | POA: Diagnosis not present

## 2022-05-27 DIAGNOSIS — Z8673 Personal history of transient ischemic attack (TIA), and cerebral infarction without residual deficits: Secondary | ICD-10-CM

## 2022-05-27 LAB — POCT INR: INR: 2.8 (ref 2.0–3.0)

## 2022-05-27 NOTE — Patient Instructions (Addendum)
Description   Continue taking Warfarin 1 tablet daily except 1/2 tablet on Sundays. Recheck INR in 6 weeks. [facility] [phone] or [phone]

## 2022-05-28 ENCOUNTER — Ambulatory Visit: Payer: Medicare Other | Attending: Cardiology | Admitting: Cardiology

## 2022-05-28 VITALS — BP 130/62 | HR 79 | Ht 62.0 in | Wt 163.4 lb

## 2022-05-28 DIAGNOSIS — I2510 Atherosclerotic heart disease of native coronary artery without angina pectoris: Secondary | ICD-10-CM | POA: Diagnosis not present

## 2022-05-28 DIAGNOSIS — I350 Nonrheumatic aortic (valve) stenosis: Secondary | ICD-10-CM

## 2022-05-28 DIAGNOSIS — Z952 Presence of prosthetic heart valve: Secondary | ICD-10-CM

## 2022-05-28 DIAGNOSIS — Z951 Presence of aortocoronary bypass graft: Secondary | ICD-10-CM

## 2022-05-28 DIAGNOSIS — I10 Essential (primary) hypertension: Secondary | ICD-10-CM

## 2022-05-28 DIAGNOSIS — I080 Rheumatic disorders of both mitral and aortic valves: Secondary | ICD-10-CM

## 2022-05-28 DIAGNOSIS — Z8673 Personal history of transient ischemic attack (TIA), and cerebral infarction without residual deficits: Secondary | ICD-10-CM

## 2022-05-28 DIAGNOSIS — E782 Mixed hyperlipidemia: Secondary | ICD-10-CM

## 2022-05-28 NOTE — Patient Instructions (Signed)
Medication Instructions:   Your physician recommends that you continue on your current medications as directed. Please refer to the Current Medication list given to you today.  *If you need a refill on your cardiac medications before your next appointment, please call your pharmacy*    Follow-Up: At [facility], you and your health needs are our priority.  As part of our continuing mission to provide you with exceptional heart care, we have created designated Provider Care Teams.  These Care Teams include your primary Cardiologist (physician) and Advanced Practice Providers (APPs -  Physician Assistants and Nurse Practitioners) who all work together to provide you with the care you need, when you need it.  We recommend signing up for the patient portal called "MyChart".  Sign up information is provided on this After Visit Summary.  MyChart is used to connect with patients for Virtual Visits (Telemedicine).  Patients are able to view lab/test results, encounter notes, upcoming appointments, etc.  Non-urgent messages can be sent to your provider as well.   To learn more about what you can do with MyChart, go to [email or web address].    Your next appointment:   1 year(s)  The format for your next appointment:   In Person  Provider:   Heather E Pemberton, MD       Important Information About Sugar       

## 2022-05-28 NOTE — Progress Notes (Signed)
Cardiology Office Note:    Date:  05/28/2022   ID:  ANNEKE CUNDY, DOB Sep 25, 1955, MRN [id number]  PCP:  Michael Boston, MD   [facility] Providers Cardiologist:  Freada Bergeron, MD {   Referring MD: Michael Boston, MD    History of Present Illness:    Elizabeth Morton is a 66 y.o. 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 Meda Coffee who now reutrns to clinic for follow-up.  Per review of the record, the patient was hospitalized in 05/2016 for elective ventral hernia repair that was complicated by acute blood loss with significant rectal sheath hematoma. These happen when the patient was being bridged with Lovenox. Last echo 02/2017 showed LVEF of 60-65%, grade 1 DD, normal functioning aortic valve with mean gradient of 65mg Hg from 41 mm Hg.    She was admitted in July 2020 with weakness fever with elevated lactic acid. Blood culture growing GPCs, BCID MSSA. Cardiology was involved for elevated Hs- troponin 535>>679 in setting of bacteremia.  She underwent TEE that rule out any vegetation on her prosthetic aortic valve.  Was last seen in clinic on 01/2021 where she was doing well from a CV standpoint. TTE 02/2021 with LVEF 60-65%, moderate LAE, 22109mSt Jude mechanical AVR with normal function and no AR with mean gradient 109m73m.  Today, she reports that she is doing well. She was recently diagnosed with gout but she has recovered. Otherwise, she is stable from a CV standpoint. She denies any palpitations, chest pain, shortness of breath, or peripheral edema. No lightheadedness, headaches, syncope, orthopnea, or PND. INR is well controlled on warfarin. No  bleeding issues.   Past Medical History:  Diagnosis Date   Aortic stenosis    Aortic valve replacement 2006   Bacteremia 01/19/2019   CAD (coronary artery disease)    a. s/p CABG 2006 at time of AVR.   CVA (cerebral infarction)    related to thrombosed aortic root aneurysm   Dyslipidemia    Gallstones    GERD (gastroesophageal reflux disease)    Heart murmur    Hx of CABG    2006,LIMA to LAD, SVG to diagonal   Hx of transfusion of packed red blood cells    Hypertension    Hypothyroidism    Iron deficiency anemia    Lower GI bleed 06/2015   Pericardial effusion    Insignificant small pericardial effusion seen on echo, November, 2013   Peripheral vascular disease ([facility]   blood clot -stroke   S/P aortic valve replacement    a. Bentall procedure,#21 St. Jude mechanical valve conduit with reimplantation of the coronaries 2006   Status post colonoscopy with polypectomy    "bleeding; colonoscopy was ~ 06/18/2015"   [facility]   no deficits    Warfarin anticoagulation    coumadin therapy    Past Surgical History:  Procedure Laterality Date   AORTIC VALVE REPLACEMENT  2006   BACK SURGERY     CARDIAC CATHETERIZATION  ~ 2006   [place] 06[place]2015   Procedure: COLONOSCOPY;  Surgeon: CarGatha MayerD;  Location:  [place] [facility];  Service: Endoscopy;  Laterality: N/A;   COLONOSCOPY W/ BIOPSIES AND POLYPECTOMY  06/18/2015   CORONARY ARTERY BYPASS GRAFT  2006   ESOPHAGOGASTRODUODENOSCOPY  06/18/2015   INSERTION OF MESH  05/15/2016   Procedure: INSERTION OF MESH;  Surgeon: Johnathan Hausen, MD;  Location: [facility];  Service: General;;   LAPAROSCOPIC ASSISTED VENTRAL HERNIA REPAIR N/A 05/15/2016   Procedure: LAPAROSCOPIC ASSISTED VENTRAL HERNIA REPAIR WITH MESH;  Surgeon: Johnathan Hausen, MD;  Location: [facility];  Service: General;  Laterality: N/A;   LAPAROSCOPIC CHOLECYSTECTOMY SINGLE PORT  07/21/2012   Procedure:  LAPAROSCOPIC CHOLECYSTECTOMY SINGLE PORT;  Surgeon: Adin Hector, MD;  Location: [place];  Service: General;  Laterality: N/A;   LUMBAR FUSION  ~ 1973   TEE WITHOUT CARDIOVERSION N/A 01/19/2019   Procedure: TRANSESOPHAGEAL ECHOCARDIOGRAM (TEE);  Surgeon: Lelon Perla, MD;  [facility];  Service: Cardiovascular;  Laterality: N/A;   TONSILLECTOMY      Current Medications: Current Meds  Medication Sig   acetaminophen (TYLENOL) 500 MG tablet Take 500-1,000 mg by mouth every 6 (six) hours as needed (for pain or headaches).    allopurinol (ZYLOPRIM) 100 MG tablet Take 100 mg by mouth daily.   Calcium Carbonate-Vitamin D 600-400 MG-UNIT tablet Take 1 tablet by mouth 2 (two) times daily.   furosemide (LASIX) 20 MG tablet Take 20 mg by mouth daily.   levothyroxine (SYNTHROID, LEVOTHROID) 50 MCG tablet Take 50 mcg by mouth daily before breakfast.   losartan (COZAAR) 25 MG tablet Take 25 mg by mouth daily.   Multiple Vitamins-Minerals (PRESERVISION/LUTEIN PO) Take 1 capsule by mouth daily with breakfast.    rosuvastatin (CRESTOR) 20 MG tablet Take 20 mg by mouth daily.   triazolam (HALCION) 0.25 MG tablet Take 0.25 mg by mouth at bedtime.    warfarin (COUMADIN) 3 MG tablet Take 1 tablet daily except 1/2 tablet on Mondays or as directed by Anticoagulation Clinic.     Allergies:   Patient has no known allergies.   Social History   Socioeconomic History   Marital status: Married    Spouse name: Not on file   Number of children: 1   Years of education: Not on file   Highest education level: Not on file  Occupational History   Occupation: homemaker  Tobacco Use   Smoking status: Former    Packs/day: 1.00    Years: 25.00    Total pack years: 25.00    Types: Cigarettes    Quit date: 07/15/2004    Years since quitting: 17.8   Smokeless tobacco: Never  Vaping Use   Vaping Use: Never used  Substance and Sexual Activity   Alcohol use: No   Drug use: No   Sexual activity: Not  Currently  Other Topics Concern   Not on file  Social History Narrative   She lives in [place] with her husband and son. She previously smoked 37-pack-years,but quit following her stroke in April of 2006. She denies any alcohol or drugs. She has not been routinely exercising.   Social Determinants of Health   Financial Resource Strain: Not on file  Food Insecurity: Not on file  Transportation Needs: Not on file  Physical Activity: Not on file  Stress: Not on file  Social Connections: Not on file     Family History: The patient's family history includes Alcohol abuse in her father; Heart disease in her mother; Hyperlipidemia in her father; Hypertension in her father; Stroke in her brother, father, and mother.  There is no history of Colon cancer or Breast cancer.  ROS:   Please see the history of present illness.    Review of Systems  Constitutional:  Negative for chills and fever.  HENT:  Negative for hearing loss.   Eyes:  Negative for blurred vision.  Respiratory:  Negative for shortness of breath.   Cardiovascular:  Positive for leg swelling (R ankle,). Negative for chest pain, palpitations, orthopnea, claudication and PND.  Gastrointestinal:  Negative for blood in stool, melena, nausea and vomiting.  Genitourinary:  Negative for hematuria.  Musculoskeletal:  Negative for falls.  Neurological:  Negative for dizziness and loss of consciousness.  Psychiatric/Behavioral:  Negative for substance abuse.      EKGs/Labs/Other Studies Reviewed:    The following studies were reviewed today: TTE March 21, 2021: IMPRESSIONS     1. Left ventricular ejection fraction, by estimation, is 60 to 65%. The  left ventricle has normal function. The left ventricle has no regional  wall motion abnormalities. Left ventricular diastolic parameters were  normal.   2. Right ventricular systolic function is normal. The right ventricular  size is normal.   3. Left atrial size was moderately dilated.    4. The pericardial effusion is posterior to the left ventricle.   5. The mitral valve is abnormal. Trivial mitral valve regurgitation. No  evidence of mitral stenosis.   6. Post AVR with 21 mm St Jude mechanical valve gradients have decreased  since echo done 01/18/19 normal function no AR . The aortic valve has been  repaired/replaced. Aortic valve regurgitation is not visualized. No aortic  stenosis is present. There is a  21 mm St. Jude mechanical valve present in the aortic position. Procedure  Date: 2006.   7. The inferior vena cava is normal in size with greater than 50%  respiratory variability, suggesting right atrial pressure of 3 mmHg.   Comparison(s): 01/18/19 EF 60-65% AV 63mHg peak PG, 195mg mean PG.  TTE: 01/2019  1. The left ventricle has normal systolic function with an ejection  fraction of 60-65%. The cavity size was normal. There is mild asymmetric  left ventricular hypertrophy. Left ventricular diastolic Doppler  parameters are consistent with impaired  relaxation. Elevated left ventricular end-diastolic pressure No evidence  of left ventricular regional wall motion abnormalities.   2. The right ventricle has normal systolic function. The cavity was  normal. There is no increase in right ventricular wall thickness. Right  ventricular systolic pressure could not be assessed.   3. S/P mechanical AVR. Valve prosthesis not well visualized. The mean AVG  is 1492m, AVA 1.25cm2 and dimensionless index normal at 0.5. No  perivalvular AI noted.   4. The interatrial septum appears to be lipomatous.   5. Trivial pericardial effusion is present   6. The pericardial effusion is posterior to the left ventricle.   7. Compared to echo 2018, the mean AVG has decreased from 87m67mto  14mm76m EKG:  EKG is personally reviewed.  05/28/2022: Normal sinus rhythm, HR 79 bpm. 01/27/2022: NSR HR 76  Recent Labs: No results found for requested labs within last 365 days.  Recent Lipid  Panel No results found for: "CHOL", "TRIG", "HDL", "CHOLHDL", "VLDL", "LDLCALC", "LDLDIRECT"   Risk Assessment/Calculations:           Physical Exam:    VS:  BP 130/62   Pulse 79   Ht '5\' 2"'$  (1.575 m)   Wt 163 lb 6.4 oz (74.1 kg)   SpO2 96%  BMI 29.89 kg/m     Wt Readings from Last 3 Encounters:  05/28/22 163 lb 6.4 oz (74.1 kg)  01/27/21 165 lb 3.2 oz (74.9 kg)  01/03/20 160 lb (72.6 kg)     GEN:  Well nourished, well developed in no acute distress HEENT: Normal NECK: No JVD; No carotid bruits CARDIAC: RRR, crisp mechanical AoV click with 2/6 systolic murmur. No rubs or gallops RESPIRATORY:  Clear to auscultation without rales, wheezing or rhonchi  ABDOMEN: Soft, non-tender, non-distended MUSCULOSKELETAL:  No edema; No deformity  SKIN: Warm and dry NEUROLOGIC:  Alert and oriented x 3 PSYCHIATRIC:  Normal affect   ASSESSMENT:    1. Severe aortic stenosis   2. S/P aortic valve replacement   3. Coronary artery disease involving native coronary artery of native heart without angina pectoris   4. Hx of CABG   5. Essential hypertension   6. History of CVA (cerebrovascular accident)   7. Mixed hyperlipidemia   8. Mitral regurgitation and aortic stenosis     PLAN:    In order of problems listed above:  #Aortic Stenosis s/p Mechanical AVR:  TTE 02/2021 with LVEF 60-65%, mean gradient 68mHg, no AI. -Continue warfarin for AC; CANNOT TOLERATE LOVENOX bridge; will need heparin gtt for bridging for future procedures -SBE ppx  #CAD s/p CABG in 2006: Doing well with no anginal symptoms. -Not on ASA due to need for warfarin -Continue crestor '20mg'$  daily -Continue losartan '20mg'$  daily  #Moderate MR>Trivial: Improved to trivial MR on TTE 02/2021  #HTN: Blood pressure well controlled at home. -Continue losartan '25mg'$  daily  #HLD: -Continue crestor '20mg'$  daily -Goal LDL <70 given known CAD    Follow Up: 1 year Medication Adjustments/Labs and Tests Ordered: Current  medicines are reviewed at length with the patient today.  Concerns regarding medicines are outlined above.  No orders of the defined types were placed in this encounter.  No orders of the defined types were placed in this encounter.   Patient Instructions  Medication Instructions:   Your physician recommends that you continue on your current medications as directed. Please refer to the Current Medication list given to you today.  *If you need a refill on your cardiac medications before your next appointment, please call your pharmacy*    Follow-Up: [facility] you and your health needs are our priority.  As part of our continuing mission to provide you with exceptional heart care, we have created designated Provider Care Teams.  These Care Teams include your primary Cardiologist (physician) and Advanced Practice Providers (APPs -  Physician Assistants and Nurse Practitioners) who all work together to provide you with the care you need, when you need it.  We recommend signing up for the patient portal called "MyChart".  Sign up information is provided on this After Visit Summary.  MyChart is used to connect with patients for Virtual Visits (Telemedicine).  Patients are able to view lab/test results, encounter notes, upcoming appointments, etc.  Non-urgent messages can be sent to your provider as well.   To learn more about what you can do with MyChart, go to [email or web address]    Your next appointment:   1 year(s)  The format for your next appointment:   In Person  Provider:   HFreada Bergeron MD      Important Information About SK[place]ord,acting as a scribe for HFreada Bergeron MD.,have documented all relevant documentation on the behalf of  Freada Bergeron, MD,as directed by  Freada Bergeron, MD while in the presence of Freada Bergeron, MD.   I, Freada Bergeron, MD, have reviewed all documentation for this visit.  The documentation on 05/28/22 for the exam, diagnosis, procedures, and orders are all accurate and complete.   Signed, Freada Bergeron, MD  05/28/2022 12:33 PM    [place]

## 2022-05-29 ENCOUNTER — Other Ambulatory Visit: Payer: Self-pay

## 2022-05-29 DIAGNOSIS — Z7901 Long term (current) use of anticoagulants: Secondary | ICD-10-CM

## 2022-05-29 MED ORDER — WARFARIN SODIUM 3 MG PO TABS: ORAL_TABLET | ORAL | 0 refills | Status: DC

## 2022-05-29 NOTE — Telephone Encounter (Signed)
Prescription refill request received for warfarin Lov: 05/29/22 Elizabeth Morton)  Next INR check: 07/15/21 Warfarin tablet strength: '3mg'$   Appropriate dose and refill sent to requested pharmacy.

## 2022-06-01 NOTE — Addendum Note (Signed)
Addended by: Sharee Holster R on: 06/01/2022 10:54 AM   Modules accepted: Orders

## 2022-06-12 ENCOUNTER — Other Ambulatory Visit: Payer: Self-pay | Admitting: Internal Medicine

## 2022-06-12 DIAGNOSIS — Z1231 Encounter for screening mammogram for malignant neoplasm of breast: Secondary | ICD-10-CM

## 2022-07-15 ENCOUNTER — Other Ambulatory Visit: Payer: Self-pay | Admitting: *Deleted

## 2022-07-15 ENCOUNTER — Ambulatory Visit: Payer: Medicare HMO | Attending: Cardiovascular Disease

## 2022-07-15 DIAGNOSIS — Z952 Presence of prosthetic heart valve: Secondary | ICD-10-CM

## 2022-07-15 DIAGNOSIS — Z7901 Long term (current) use of anticoagulants: Secondary | ICD-10-CM

## 2022-07-15 DIAGNOSIS — Z5181 Encounter for therapeutic drug level monitoring: Secondary | ICD-10-CM

## 2022-07-15 LAB — POCT INR: INR: 3.3 — AB (ref 2.0–3.0)

## 2022-07-15 MED ORDER — WARFARIN SODIUM 3 MG PO TABS: ORAL_TABLET | ORAL | 1 refills | Status: DC

## 2022-07-15 NOTE — Telephone Encounter (Signed)
Last INR today Last OV 05/28/2022 Warfarin refill sent

## 2022-07-15 NOTE — Patient Instructions (Signed)
Description   Only take 1/2 tablet today and then continue taking Warfarin 1 tablet daily except 1/2 tablet on Sundays. Recheck INR in 4 weeks.  Coumadin Clinic [phone]

## 2022-08-10 ENCOUNTER — Ambulatory Visit
Admission: RE | Admit: 2022-08-10 | Discharge: 2022-08-10 | Disposition: A | Discharge status: Home / Self Care | Payer: Medicare HMO | Source: Ambulatory Visit | Attending: Internal Medicine | Admitting: Internal Medicine

## 2022-08-10 DIAGNOSIS — Z1231 Encounter for screening mammogram for malignant neoplasm of breast: Secondary | ICD-10-CM | POA: Diagnosis not present

## 2022-08-12 ENCOUNTER — Ambulatory Visit: Payer: Medicare HMO | Attending: Cardiology | Admitting: *Deleted

## 2022-08-12 DIAGNOSIS — Z7901 Long term (current) use of anticoagulants: Secondary | ICD-10-CM

## 2022-08-12 DIAGNOSIS — Z952 Presence of prosthetic heart valve: Secondary | ICD-10-CM

## 2022-08-12 DIAGNOSIS — Z8673 Personal history of transient ischemic attack (TIA), and cerebral infarction without residual deficits: Secondary | ICD-10-CM

## 2022-08-12 LAB — POCT INR: INR: 2.4 (ref 2.0–3.0)

## 2022-08-12 NOTE — Patient Instructions (Addendum)
Description   Continue taking Warfarin 1 tablet daily except 1/2 tablet on Sundays. Recheck INR in 6 weeks. Coumadin Clinic [phone] or [phone]

## 2022-09-23 ENCOUNTER — Ambulatory Visit: Payer: Medicare HMO | Attending: Cardiovascular Disease | Admitting: *Deleted

## 2022-09-23 DIAGNOSIS — Z952 Presence of prosthetic heart valve: Secondary | ICD-10-CM | POA: Diagnosis not present

## 2022-09-23 DIAGNOSIS — Z8673 Personal history of transient ischemic attack (TIA), and cerebral infarction without residual deficits: Secondary | ICD-10-CM

## 2022-09-23 DIAGNOSIS — Z7901 Long term (current) use of anticoagulants: Secondary | ICD-10-CM

## 2022-09-23 LAB — POCT INR: INR: 2.5 (ref 2.0–3.0)

## 2022-09-23 NOTE — Patient Instructions (Signed)
Description   Continue taking Warfarin 1 tablet daily except 1/2 tablet on Sundays. Recheck INR in 6 weeks. Coumadin Clinic [phone] or [phone]      

## 2022-11-04 ENCOUNTER — Ambulatory Visit: Payer: Medicare HMO | Attending: Internal Medicine | Admitting: *Deleted

## 2022-11-04 DIAGNOSIS — Z8673 Personal history of transient ischemic attack (TIA), and cerebral infarction without residual deficits: Secondary | ICD-10-CM | POA: Diagnosis not present

## 2022-11-04 DIAGNOSIS — Z7901 Long term (current) use of anticoagulants: Secondary | ICD-10-CM | POA: Diagnosis not present

## 2022-11-04 DIAGNOSIS — Z952 Presence of prosthetic heart valve: Secondary | ICD-10-CM | POA: Diagnosis not present

## 2022-11-04 LAB — POCT INR: INR: 2.2 (ref 2.0–3.0)

## 2022-11-04 NOTE — Patient Instructions (Signed)
Description   Continue taking Warfarin 1 tablet daily except 1/2 tablet on Sundays. Recheck INR in 6 weeks. Coumadin Clinic [phone] or [phone]      

## 2022-12-16 ENCOUNTER — Ambulatory Visit: Payer: Medicare HMO | Attending: Cardiovascular Disease | Admitting: *Deleted

## 2022-12-16 DIAGNOSIS — Z7901 Long term (current) use of anticoagulants: Secondary | ICD-10-CM | POA: Diagnosis not present

## 2022-12-16 DIAGNOSIS — Z952 Presence of prosthetic heart valve: Secondary | ICD-10-CM

## 2022-12-16 DIAGNOSIS — Z8673 Personal history of transient ischemic attack (TIA), and cerebral infarction without residual deficits: Secondary | ICD-10-CM

## 2022-12-16 LAB — POCT INR: INR: 2.5 (ref 2.0–3.0)

## 2022-12-16 NOTE — Patient Instructions (Signed)
Description   Continue taking Warfarin 1 tablet daily except 1/2 tablet on Sundays. Recheck INR in 6 weeks. Coumadin Clinic [phone] or [phone]      

## 2023-01-27 ENCOUNTER — Ambulatory Visit: Payer: Medicare HMO | Attending: Cardiovascular Disease | Admitting: *Deleted

## 2023-01-27 DIAGNOSIS — Z7901 Long term (current) use of anticoagulants: Secondary | ICD-10-CM

## 2023-01-27 DIAGNOSIS — Z8673 Personal history of transient ischemic attack (TIA), and cerebral infarction without residual deficits: Secondary | ICD-10-CM

## 2023-01-27 DIAGNOSIS — Z952 Presence of prosthetic heart valve: Secondary | ICD-10-CM | POA: Diagnosis not present

## 2023-01-27 LAB — POCT INR: INR: 1.8 — AB (ref 2.0–3.0)

## 2023-01-27 NOTE — Patient Instructions (Addendum)
Description   Today take 1.5 tablets of warfarin then continue taking Warfarin 1 tablet daily except 1/2 tablet on Sundays. Recheck INR in 5 weeks. Coumadin Clinic [phone] or [phone]

## 2023-03-02 DIAGNOSIS — R52 Pain, unspecified: Secondary | ICD-10-CM | POA: Diagnosis not present

## 2023-03-02 DIAGNOSIS — G43909 Migraine, unspecified, not intractable, without status migrainosus: Secondary | ICD-10-CM | POA: Diagnosis not present

## 2023-03-02 DIAGNOSIS — R051 Acute cough: Secondary | ICD-10-CM | POA: Diagnosis not present

## 2023-03-02 DIAGNOSIS — J189 Pneumonia, unspecified organism: Secondary | ICD-10-CM | POA: Diagnosis not present

## 2023-03-02 DIAGNOSIS — Z1152 Encounter for screening for COVID-19: Secondary | ICD-10-CM | POA: Diagnosis not present

## 2023-03-02 DIAGNOSIS — H9201 Otalgia, right ear: Secondary | ICD-10-CM | POA: Diagnosis not present

## 2023-03-02 DIAGNOSIS — R509 Fever, unspecified: Secondary | ICD-10-CM | POA: Diagnosis not present

## 2023-03-02 DIAGNOSIS — J029 Acute pharyngitis, unspecified: Secondary | ICD-10-CM | POA: Diagnosis not present

## 2023-03-03 ENCOUNTER — Telehealth: Payer: Self-pay | Admitting: *Deleted

## 2023-03-03 ENCOUNTER — Ambulatory Visit: Payer: Medicare HMO

## 2023-03-03 NOTE — Telephone Encounter (Signed)
Pt left a voicemail that stated she would not be able to make her appt today. Returned call to the pt and she was sick with pneumonia and was prescribed a new med. Advised would cancel today's appt and reschedule. She was able to spell our the new medication, which is levofloxacin (levaquin). It is 500mg  and she will take 1 by mouth daily for 10 days.  Advised since this med interacts with warfarin to take warfarin 1/2 tablet on 8/22 & on 8/23 then resume normal dose and recheck INR on Tuesday. Also, advised to have slaw since she does not eat dark green leafy veggies. She verbalized understanding and will call if needed.

## 2023-03-09 ENCOUNTER — Ambulatory Visit: Payer: Medicare HMO | Attending: Cardiovascular Disease | Admitting: *Deleted

## 2023-03-09 DIAGNOSIS — Z5181 Encounter for therapeutic drug level monitoring: Secondary | ICD-10-CM

## 2023-03-09 DIAGNOSIS — Z952 Presence of prosthetic heart valve: Secondary | ICD-10-CM

## 2023-03-09 LAB — POCT INR: POC INR: 2.1

## 2023-03-09 NOTE — Patient Instructions (Signed)
Description   Continue taking Warfarin 1 tablet daily except 1/2 tablet on Sundays. Recheck INR in 6 weeks. Coumadin Clinic [phone] or [phone]

## 2023-04-21 ENCOUNTER — Ambulatory Visit: Payer: Medicare HMO | Attending: Cardiovascular Disease | Admitting: *Deleted

## 2023-04-21 DIAGNOSIS — Z952 Presence of prosthetic heart valve: Secondary | ICD-10-CM

## 2023-04-21 DIAGNOSIS — Z8673 Personal history of transient ischemic attack (TIA), and cerebral infarction without residual deficits: Secondary | ICD-10-CM | POA: Diagnosis not present

## 2023-04-21 DIAGNOSIS — Z7901 Long term (current) use of anticoagulants: Secondary | ICD-10-CM | POA: Diagnosis not present

## 2023-04-21 LAB — POCT INR: INR: 2.3 (ref 2.0–3.0)

## 2023-04-21 NOTE — Patient Instructions (Signed)
Description   Continue taking Warfarin 1 tablet daily except 1/2 tablet on Sundays. Recheck INR in 6 weeks. Coumadin Clinic [phone] or [phone]

## 2023-04-28 DIAGNOSIS — M810 Age-related osteoporosis without current pathological fracture: Secondary | ICD-10-CM | POA: Diagnosis not present

## 2023-04-28 DIAGNOSIS — E785 Hyperlipidemia, unspecified: Secondary | ICD-10-CM | POA: Diagnosis not present

## 2023-04-28 DIAGNOSIS — E039 Hypothyroidism, unspecified: Secondary | ICD-10-CM | POA: Diagnosis not present

## 2023-04-28 DIAGNOSIS — Z1389 Encounter for screening for other disorder: Secondary | ICD-10-CM | POA: Diagnosis not present

## 2023-04-28 DIAGNOSIS — M109 Gout, unspecified: Secondary | ICD-10-CM | POA: Diagnosis not present

## 2023-05-05 DIAGNOSIS — Z6830 Body mass index (BMI) 30.0-30.9, adult: Secondary | ICD-10-CM | POA: Diagnosis not present

## 2023-05-05 DIAGNOSIS — E669 Obesity, unspecified: Secondary | ICD-10-CM | POA: Diagnosis not present

## 2023-05-05 DIAGNOSIS — Z8701 Personal history of pneumonia (recurrent): Secondary | ICD-10-CM | POA: Diagnosis not present

## 2023-05-06 ENCOUNTER — Other Ambulatory Visit: Payer: Self-pay | Admitting: Internal Medicine

## 2023-05-06 DIAGNOSIS — R1900 Intra-abdominal and pelvic swelling, mass and lump, unspecified site: Secondary | ICD-10-CM

## 2023-05-24 ENCOUNTER — Ambulatory Visit
Admission: RE | Admit: 2023-05-24 | Discharge: 2023-05-24 | Disposition: A | Discharge status: Home / Self Care | Payer: Medicare HMO | Source: Ambulatory Visit | Attending: Internal Medicine | Admitting: Internal Medicine

## 2023-05-24 DIAGNOSIS — K429 Umbilical hernia without obstruction or gangrene: Secondary | ICD-10-CM | POA: Diagnosis not present

## 2023-05-24 DIAGNOSIS — R1900 Intra-abdominal and pelvic swelling, mass and lump, unspecified site: Secondary | ICD-10-CM

## 2023-05-24 DIAGNOSIS — R140 Abdominal distension (gaseous): Secondary | ICD-10-CM | POA: Diagnosis not present

## 2023-05-24 DIAGNOSIS — K449 Diaphragmatic hernia without obstruction or gangrene: Secondary | ICD-10-CM | POA: Diagnosis not present

## 2023-05-24 DIAGNOSIS — K439 Ventral hernia without obstruction or gangrene: Secondary | ICD-10-CM | POA: Diagnosis not present

## 2023-05-24 MED ORDER — IOPAMIDOL (ISOVUE-300) INJECTION 61%
500.0000 mL | Freq: Once | INTRAVENOUS | Status: AC | PRN
  Administered 2023-05-24: 80 mL via INTRAVENOUS

## 2023-06-01 DIAGNOSIS — I69354 Hemiplegia and hemiparesis following cerebral infarction affecting left non-dominant side: Secondary | ICD-10-CM | POA: Diagnosis not present

## 2023-06-01 DIAGNOSIS — N200 Calculus of kidney: Secondary | ICD-10-CM | POA: Diagnosis not present

## 2023-06-01 DIAGNOSIS — I5032 Chronic diastolic (congestive) heart failure: Secondary | ICD-10-CM | POA: Diagnosis not present

## 2023-06-01 DIAGNOSIS — K449 Diaphragmatic hernia without obstruction or gangrene: Secondary | ICD-10-CM | POA: Diagnosis not present

## 2023-06-01 DIAGNOSIS — R1900 Intra-abdominal and pelvic swelling, mass and lump, unspecified site: Secondary | ICD-10-CM | POA: Diagnosis not present

## 2023-06-01 DIAGNOSIS — E785 Hyperlipidemia, unspecified: Secondary | ICD-10-CM | POA: Diagnosis not present

## 2023-06-01 DIAGNOSIS — K439 Ventral hernia without obstruction or gangrene: Secondary | ICD-10-CM | POA: Diagnosis not present

## 2023-06-01 DIAGNOSIS — I700 Atherosclerosis of aorta: Secondary | ICD-10-CM | POA: Diagnosis not present

## 2023-06-01 DIAGNOSIS — I2510 Atherosclerotic heart disease of native coronary artery without angina pectoris: Secondary | ICD-10-CM | POA: Diagnosis not present

## 2023-06-01 DIAGNOSIS — E669 Obesity, unspecified: Secondary | ICD-10-CM | POA: Diagnosis not present

## 2023-06-02 ENCOUNTER — Ambulatory Visit: Payer: Medicare HMO | Attending: Cardiovascular Disease | Admitting: *Deleted

## 2023-06-02 DIAGNOSIS — Z7901 Long term (current) use of anticoagulants: Secondary | ICD-10-CM | POA: Diagnosis not present

## 2023-06-02 DIAGNOSIS — Z952 Presence of prosthetic heart valve: Secondary | ICD-10-CM

## 2023-06-02 DIAGNOSIS — Z8673 Personal history of transient ischemic attack (TIA), and cerebral infarction without residual deficits: Secondary | ICD-10-CM | POA: Diagnosis not present

## 2023-06-02 LAB — POCT INR: INR: 1.5 — AB (ref 2.0–3.0)

## 2023-06-02 NOTE — Patient Instructions (Addendum)
Description   Today take 1.5 tablets of warfarin and tomorrow take 1.5 tablets of warfarin then continue taking Warfarin 1 tablet daily except 1/2 tablet on Sundays. Recheck INR in 2 weeks (6 weeks). Coumadin Clinic [phone] or [phone]

## 2023-06-07 ENCOUNTER — Ambulatory Visit: Payer: Self-pay | Admitting: Surgery

## 2023-06-07 DIAGNOSIS — K430 Incisional hernia with obstruction, without gangrene: Secondary | ICD-10-CM | POA: Insufficient documentation

## 2023-06-07 DIAGNOSIS — Z952 Presence of prosthetic heart valve: Secondary | ICD-10-CM | POA: Diagnosis not present

## 2023-06-07 DIAGNOSIS — K449 Diaphragmatic hernia without obstruction or gangrene: Secondary | ICD-10-CM | POA: Insufficient documentation

## 2023-06-07 DIAGNOSIS — I5032 Chronic diastolic (congestive) heart failure: Secondary | ICD-10-CM | POA: Diagnosis not present

## 2023-06-07 DIAGNOSIS — Z7901 Long term (current) use of anticoagulants: Secondary | ICD-10-CM | POA: Diagnosis not present

## 2023-06-08 ENCOUNTER — Telehealth: Payer: Self-pay

## 2023-06-08 NOTE — Telephone Encounter (Signed)
   Pre-operative Risk Assessment    Patient Name: Elizabeth Morton  DOB: 15-Feb-1956 MRN: [id number]    Last OV: 05/28/2022 with Dr. Shari Prows Next OV: 06/16/2023 with Dr. Tenny Craw pre-op has been added to upcoming office visit.   Request for Surgical Clearance    Procedure:   Lap. Ventral Hernia Repair w/ Mesh  Date of Surgery:  Clearance TBD                                 Surgeon:  Dr. Karie Soda   Surgeon's Group or Practice Name:  [facility]  Phone number:  [phone]  Fax number:  [phone] ATTN: Ethlyn Gallery, CMA   Type of Clearance Requested:   - Medical  - Pharmacy:  Hold Warfarin (Coumadin) Not indicated   Type of Anesthesia:  General    Additional requests/questions:    Elizabeth Morton   06/08/2023, 10:39 AM

## 2023-06-12 ENCOUNTER — Telehealth: Payer: Self-pay | Admitting: Pharmacist Clinician (PhC)/ Clinical Pharmacy Specialist

## 2023-06-12 NOTE — Telephone Encounter (Signed)
Patient with diagnosis of mechanical atrial valve on warfarin for anticoagulation.    Procedure:   Lap. Ventral Hernia Repair w/ Mesh   Date of Surgery:  Clearance TBD  CrCl 47 Platelet count 309  Per office protocol, patient can hold warfarin for 5 days prior to procedure.   Patient WILL need bridging with Lovenox (enoxaparin) around procedure.  Patient is self tester, followed by [facility].  Have messaged them to coordinate bridge with patient.    **This guidance is not considered finalized until pre-operative APP has relayed final recommendations.**

## 2023-06-12 NOTE — Telephone Encounter (Signed)
Patient has upcoming surgical procedure, as of yet not scheduled.  She will need Lovenox bridge around procedure.

## 2023-06-14 NOTE — Telephone Encounter (Signed)
Pt has appointment with Dr. Tenny Craw on 06/16/2023 for pre-operative clearance and [facility] on 06/16/2023 for Lovenox bridging.

## 2023-06-16 ENCOUNTER — Ambulatory Visit: Payer: Medicare HMO | Attending: Internal Medicine | Admitting: Internal Medicine

## 2023-06-16 ENCOUNTER — Ambulatory Visit (INDEPENDENT_AMBULATORY_CARE_PROVIDER_SITE_OTHER): Payer: Self-pay | Admitting: *Deleted

## 2023-06-16 ENCOUNTER — Ambulatory Visit: Payer: Medicare HMO | Admitting: Internal Medicine

## 2023-06-16 ENCOUNTER — Encounter: Payer: Self-pay | Admitting: Internal Medicine

## 2023-06-16 ENCOUNTER — Ambulatory Visit: Payer: Medicare HMO

## 2023-06-16 VITALS — BP 130/80 | HR 76 | Ht 62.0 in | Wt 170.4 lb

## 2023-06-16 DIAGNOSIS — Z7901 Long term (current) use of anticoagulants: Secondary | ICD-10-CM

## 2023-06-16 DIAGNOSIS — I10 Essential (primary) hypertension: Secondary | ICD-10-CM

## 2023-06-16 DIAGNOSIS — Z952 Presence of prosthetic heart valve: Secondary | ICD-10-CM

## 2023-06-16 DIAGNOSIS — Z01818 Encounter for other preprocedural examination: Secondary | ICD-10-CM

## 2023-06-16 DIAGNOSIS — Z8673 Personal history of transient ischemic attack (TIA), and cerebral infarction without residual deficits: Secondary | ICD-10-CM

## 2023-06-16 LAB — PROTIME-INR
INR: 2.1 — ABNORMAL HIGH (ref 0.9–1.2)
Prothrombin Time: 22.2 s — ABNORMAL HIGH (ref 9.1–12.0)

## 2023-06-16 MED ORDER — WARFARIN SODIUM 3 MG PO TABS: ORAL_TABLET | ORAL | 1 refills | Status: DC

## 2023-06-16 NOTE — Progress Notes (Signed)
Cardiology Office Note:    Date:  06/16/2023   ID:  Elizabeth Morton, DOB 1955/07/14, MRN [id number]  PCP:  Melida Quitter, MD   [facility] Providers Cardiologist:  None {   Referring MD: Melida Quitter, MD    History of Present Illness:    Elizabeth Morton is a 67 y.o. 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 These happen when the patient was being bridged with Lovenox. Last echo 02/2017 showed LVEF of 60-65%, grade 1 DD, normal functioning aortic valve with mean gradient of Hg from 41 mm Hg.    She was admitted in July 2020 with weakness fever with elevated lactic acid. Blood culture growing GPCs, BCID MSSA. Cardiology was involved for elevated Hs- troponin 535>>679 in setting of bacteremia.  She underwent TEE that rule out any vegetation on her prosthetic aortic valve.  TTE 02/2021 with LVEF 60-65%, moderate LAE, 21mm St Jude mechanical AVR with normal function and no AR with mean gradient .   The pt was last seen by Jon Billings in Nov 2023  Since seen she has done well  Breathing is good  No CP  No dizzinesss  No palpitations   Being evaluated for ventral hernia surgery    Past Medical History:  Diagnosis Date   Aortic stenosis    Aortic valve replacement 2006   Bacteremia 01/19/2019   CAD (coronary artery disease)    a. s/p CABG 2006 at time of AVR.   CVA (cerebral infarction)    related to thrombosed aortic root aneurysm   Dyslipidemia    Gallstones    GERD (gastroesophageal reflux  disease)    Heart murmur    Hx of CABG    2006,LIMA to LAD, SVG to diagonal   Hx of transfusion of packed red blood cells    Hypertension    Hypothyroidism    Iron deficiency anemia    Lower GI bleed 06/2015   Pericardial effusion    Insignificant small pericardial effusion seen on echo, November, 2013   Peripheral vascular disease (HCC) 06   blood clot -stroke   S/P aortic valve replacement    a. Bentall procedure,#21 St. Jude mechanical valve conduit with reimplantation of the coronaries 2006   Status post colonoscopy with polypectomy    "bleeding; colonoscopy was ~ 06/18/2015"   Stroke [facility]) 2006   no deficits    Warfarin anticoagulation    coumadin therapy    Past Surgical History:  Procedure Laterality Date   AORTIC VALVE REPLACEMENT  2006   BACK SURGERY     CARDIAC CATHETERIZATION  ~ 2006   CARDIAC VALVE REPLACEMENT     CESAREAN SECTION  1985   COLONOSCOPY N/A 06/26/2015   Procedure: COLONOSCOPY;  Surgeon: Iva Boop, MD;  Location: [facility] ENDOSCOPY;  Service: Endoscopy;  Laterality: N/A;   COLONOSCOPY W/ BIOPSIES AND POLYPECTOMY  06/18/2015   CORONARY ARTERY BYPASS GRAFT  2006   ESOPHAGOGASTRODUODENOSCOPY  06/18/2015   INSERTION OF MESH  05/15/2016   Procedure: INSERTION OF MESH;  Surgeon:  Luretha Murphy, MD;  Location: [facility] ORS;  Service: General;;   LAPAROSCOPIC ASSISTED VENTRAL HERNIA REPAIR N/A 05/15/2016   Procedure: LAPAROSCOPIC ASSISTED VENTRAL HERNIA REPAIR WITH MESH;  Surgeon: Luretha Murphy, MD;  Location: [facility] ORS;  Service: General;  Laterality: N/A;   LAPAROSCOPIC CHOLECYSTECTOMY SINGLE PORT  07/21/2012   Procedure: LAPAROSCOPIC CHOLECYSTECTOMY SINGLE PORT;  Surgeon: Ardeth Sportsman, MD;  Location: [facility] OR;  Service: General;  Laterality: N/A;   LUMBAR FUSION  ~ 1973   TEE WITHOUT CARDIOVERSION N/A 01/19/2019   Procedure: TRANSESOPHAGEAL ECHOCARDIOGRAM (TEE);  Surgeon: Lewayne Bunting, MD;  Location: [facility] ENDOSCOPY;  Service: Cardiovascular;  Laterality: N/A;    TONSILLECTOMY      Current Medications: Current Meds  Medication Sig   acetaminophen (TYLENOL) 500 MG tablet Take 500-1,000 mg by mouth every 6 (six) hours as needed (for pain or headaches).    allopurinol (ZYLOPRIM) 100 MG tablet Take 100 mg by mouth daily.   Calcium Carbonate-Vitamin D 600-400 MG-UNIT tablet Take 1 tablet by mouth 2 (two) times daily.   furosemide (LASIX) 20 MG tablet Take 20 mg by mouth daily.   levothyroxine (SYNTHROID, LEVOTHROID) 50 MCG tablet Take 50 mcg by mouth daily before breakfast.   losartan (COZAAR) 25 MG tablet Take 25 mg by mouth daily.   Multiple Vitamins-Minerals (PRESERVISION/LUTEIN PO) Take 1 capsule by mouth daily with breakfast.    pantoprazole (PROTONIX) 40 MG tablet Take 40 mg by mouth daily.   rosuvastatin (CRESTOR) 20 MG tablet Take 20 mg by mouth daily.   warfarin (COUMADIN) 3 MG tablet Take 1 tablet daily except 1/2 tablet on Sundays or as directed by Anticoagulation Clinic.   [DISCONTINUED] triazolam (HALCION) 0.25 MG tablet Take 0.25 mg by mouth at bedtime.      Allergies:   Patient has no known allergies.   Social History   Socioeconomic History   Marital status: Married    Spouse name: Not on file   Number of children: 1   Years of education: Not on file   Highest education level: Not on file  Occupational History   Occupation: homemaker  Tobacco Use   Smoking status: Former    Current packs/day: 0.00    Average packs/day: 1 pack/day for 25.0 years (25.0 ttl pk-yrs)    Types: Cigarettes    Start date: 07/16/1979    Quit date: 07/15/2004    Years since quitting: 18.9   Smokeless tobacco: Never  Vaping Use   Vaping status: Never Used  Substance and Sexual Activity   Alcohol use: No   Drug use: No   Sexual activity: Not Currently  Other Topics Concern   Not on file  Social History Narrative   She lives in [place] with her husband and son. She previously smoked 37-pack-years,but quit following her stroke in April of 2006.  She denies any alcohol or drugs. She has not been routinely exercising.   Social Determinants of Health   Financial Resource Strain: Not on file  Food Insecurity: Not on file  Transportation Needs: Not on file  Physical Activity: Not on file  Stress: Not on file  Social Connections: Not on file     Family History: The patient's family history includes Alcohol abuse in her father; Heart disease in her mother; Hyperlipidemia in her father; Hypertension in her father; Stroke in her brother, father, and mother. There is no history of Colon cancer or Breast cancer.   EKGs/Labs/Other Studies Reviewed:    The following studies were reviewed  today: TTE 02/2021: IMPRESSIONS     1. Left ventricular ejection fraction, by estimation, is 60 to 65%. The  left ventricle has normal function. The left ventricle has no regional  wall motion abnormalities. Left ventricular diastolic parameters were  normal.   2. Right ventricular systolic function is normal. The right ventricular  size is normal.   3. Left atrial size was moderately dilated.   4. The pericardial effusion is posterior to the left ventricle.   5. The mitral valve is abnormal. Trivial mitral valve regurgitation. No  evidence of mitral stenosis.   6. Post AVR with 21 mm St Jude mechanical valve gradients have decreased  since echo done 01/18/19 normal function no AR . The aortic valve has been  repaired/replaced. Aortic valve regurgitation is not visualized. No aortic  stenosis is present. There is a  21 mm St. Jude mechanical valve present in the aortic position. Procedure  Date: 2006.   7. The inferior vena cava is normal in size with greater than 50%  respiratory variability, suggesting right atrial pressure of 3 mmHg.   Comparison(s): 01/18/19 EF 60-65% AV peak PG, mean PG.  EKG:  EKG is personally reviewed.  05/28/2022: Normal sinus rhythm, HR 79 bpm. 01/27/2022: NSR HR 76  Recent Labs: No results found for  requested labs within last 365 days.  Recent Lipid Panel No results found for: "CHOL", "TRIG", "HDL", "CHOLHDL", "VLDL", "LDLCALC", "LDLDIRECT"        Physical Exam:    VS:  BP 130/80   Pulse 76   Ht 5\' 2"  (1.575 m)   Wt 170 lb 6.4 oz (77.3 kg)   SpO2 100%   BMI 31.17 kg/m     Wt Readings from Last 3 Encounters:  06/16/23 170 lb 6.4 oz (77.3 kg)  05/28/22 163 lb 6.4 oz (74.1 kg)  01/27/21 165 lb 3.2 oz (74.9 kg)     GEN:  Well nourished, well developed in no acute distress HEENT: Normal NECK: No JVD; No carotid bruits CARDIAC: RRR, crisp mechanical AoV click with 2/6 systolic murmur.  RESPIRATORY:  Clear to auscultation  ABDOMEN: Soft, non-tender, no hepatomegaly   ASSESSMENT:    1. Pre-operative clearance   2. Hypertension, unspecified type   3. Warfarin anticoagulation     PLAN:     #Aortic Stenosis s/p Mechanical AVR:  TTE 02/2021 with LVEF 60-65%, mean gradient , no AI. -Continue warfarin for [facility]; Bridge prior to surgery   Concerned about problems with lovenox  Will discuss with pharmacy    Will get echo  to confirm gradients unchanged -SBE ppx  #CAD s/p CABG in 2006: Doing well with no anginal symptoms. -Not on ASA due to need for warfarin -Continue crestor 20mg  daily -Continue losartan 20mg  daily  #Moderate MR>Trivial: Improved to trivial MR on TTE 02/2021  #HTN: Blood pressure is controlled  . -Continue losartan 25mg  daily  #HLD: -Continue crestor 20mg  daily -Last LDL 64  HDL 57       Follow Up: 1 year Medication Adjustments/Labs and Tests Ordered: Current medicines are reviewed at length with the patient today.  Concerns regarding medicines are outlined above.  Orders Placed This Encounter  Procedures   EKG 12-Lead   No orders of the defined types were placed in this encounter.   Patient Instructions  Medication Instructions:   *If you need a refill on your cardiac medications before your next appointment, please call your  pharmacy*   Lab Work: INR  If you have labs (blood work) drawn today and your tests are completely normal, you will receive your results only by: MyChart Message (if you have MyChart) OR A paper copy in the mail If you have any lab test that is abnormal or we need to change your treatment, we will call you to review the results.   Testing/Procedures: Your physician has requested that you have an echocardiogram. Echocardiography is a painless test that uses sound waves to create images of your heart. It provides your doctor with information about the size and shape of your heart and how well your heart's chambers and valves are working. This procedure takes approximately one hour. There are no restrictions for this procedure. Please do NOT wear cologne, perfume, aftershave, or lotions (deodorant is allowed). Please arrive 15 minutes prior to your appointment time.  Please note: We ask at that you not bring children with you during ultrasound (echo/ vascular) testing. Due to room size and safety concerns, children are not allowed in the ultrasound rooms during exams. Our front office staff cannot provide observation of children in our lobby area while testing is being conducted. An adult accompanying a patient to their appointment will only be allowed in the ultrasound room at the discretion of the ultrasound technician under special circumstances. We apologize for any inconvenience.    Follow-Up: At [facility], you and your health needs are our priority.  As part of our continuing mission to provide you with exceptional heart care, we have created designated Provider Care Teams.  These Care Teams include your primary Cardiologist (physician) and Advanced Practice Providers (APPs -  Physician Assistants and Nurse Practitioners) who all work together to provide you with the care you need, when you need it.  We recommend signing up for the patient portal called "MyChart".  Sign up  information is provided on this After Visit Summary.  MyChart is used to connect with patients for Virtual Visits (Telemedicine).  Patients are able to view lab/test results, encounter notes, upcoming appointments, etc.  Non-urgent messages can be sent to your provider as well.   To learn more about what you can do with MyChart, go to [email or web address].    Your next appointment: one year    = Signed, Dietrich Pates, MD  06/16/2023 1:49 PM    [facility]

## 2023-06-16 NOTE — Addendum Note (Signed)
Addended by: Bertram Millard on: 06/16/2023 01:55 PM   Modules accepted: Orders

## 2023-06-16 NOTE — Patient Instructions (Signed)
Medication Instructions:   *If you need a refill on your cardiac medications before your next appointment, please call your pharmacy*   Lab Work: INR   If you have labs (blood work) drawn today and your tests are completely normal, you will receive your results only by: MyChart Message (if you have MyChart) OR A paper copy in the mail If you have any lab test that is abnormal or we need to change your treatment, we will call you to review the results.   Testing/Procedures: Your physician has requested that you have an echocardiogram. Echocardiography is a painless test that uses sound waves to create images of your heart. It provides your doctor with information about the size and shape of your heart and how well your heart's chambers and valves are working. This procedure takes approximately one hour. There are no restrictions for this procedure. Please do NOT wear cologne, perfume, aftershave, or lotions (deodorant is allowed). Please arrive 15 minutes prior to your appointment time.  Please note: We ask at that you not bring children with you during ultrasound (echo/ vascular) testing. Due to room size and safety concerns, children are not allowed in the ultrasound rooms during exams. Our front office staff cannot provide observation of children in our lobby area while testing is being conducted. An adult accompanying a patient to their appointment will only be allowed in the ultrasound room at the discretion of the ultrasound technician under special circumstances. We apologize for any inconvenience.    Follow-Up: At [facility], you and your health needs are our priority.  As part of our continuing mission to provide you with exceptional heart care, we have created designated Provider Care Teams.  These Care Teams include your primary Cardiologist (physician) and Advanced Practice Providers (APPs -  Physician Assistants and Nurse Practitioners) who all work together to provide  you with the care you need, when you need it.  We recommend signing up for the patient portal called "MyChart".  Sign up information is provided on this After Visit Summary.  MyChart is used to connect with patients for Virtual Visits (Telemedicine).  Patients are able to view lab/test results, encounter notes, upcoming appointments, etc.  Non-urgent messages can be sent to your provider as well.   To learn more about what you can do with MyChart, go to [email or web address].    Your next appointment: one year

## 2023-07-20 ENCOUNTER — Ambulatory Visit (HOSPITAL_COMMUNITY): Payer: PPO | Attending: Internal Medicine

## 2023-07-20 DIAGNOSIS — Z7901 Long term (current) use of anticoagulants: Secondary | ICD-10-CM | POA: Insufficient documentation

## 2023-07-20 DIAGNOSIS — Z01810 Encounter for preprocedural cardiovascular examination: Secondary | ICD-10-CM

## 2023-07-20 DIAGNOSIS — I10 Essential (primary) hypertension: Secondary | ICD-10-CM | POA: Insufficient documentation

## 2023-07-20 DIAGNOSIS — Z01818 Encounter for other preprocedural examination: Secondary | ICD-10-CM | POA: Diagnosis not present

## 2023-07-20 LAB — ECHOCARDIOGRAM COMPLETE
AR max vel: 1.11 cm2
AV Area VTI: 1.16 cm2
AV Area mean vel: 0.94 cm2
AV Mean grad: 8 mm[Hg]
AV Peak grad: 13.5 mm[Hg]
Ao pk vel: 1.84 m/s
Area-P 1/2: 4.33 cm2
S' Lateral: 2.5 cm

## 2023-07-22 ENCOUNTER — Telehealth: Payer: Self-pay | Admitting: Internal Medicine

## 2023-07-22 NOTE — Telephone Encounter (Signed)
Pt calling back for echo results  

## 2023-07-22 NOTE — Telephone Encounter (Signed)
 Spoke with patient and she is aware of echo results. Verbalized understanding.

## 2023-07-28 ENCOUNTER — Ambulatory Visit: Payer: PPO | Attending: Cardiovascular Disease

## 2023-07-28 DIAGNOSIS — Z952 Presence of prosthetic heart valve: Secondary | ICD-10-CM

## 2023-07-28 LAB — POCT INR: INR: 2.1 (ref 2.0–3.0)

## 2023-07-28 NOTE — Patient Instructions (Signed)
Description   Continue taking Warfarin 1 tablet daily except 1/2 tablet on Sundays. Recheck INR in 6 weeks. Coumadin Clinic [phone] or [phone]

## 2023-08-03 ENCOUNTER — Telehealth: Payer: Self-pay | Admitting: *Deleted

## 2023-08-03 NOTE — Telephone Encounter (Signed)
Pt states she is unable to get her hernia surgery scheduled because she was told in the past not to use lovenox anymore. She states she had complications three different times with bleeding and caused her to stay in the hospital longer due to complications. She states this was years ago, she states sometime in 2017 and 2018 and wanted to know what to do/use to avoid this issue. She states she wants to get the hernia scheduled done. Advised we do have the hernia clearance and that I understand her concern and would send to appropriate providers. Please advise on this. Advised the pt we will updated accordingly and she was thankful.    After assessing chart was able to find this is her OV notes, Per Dr. Delton See OV note on 08/20/2016 it states: The patient was hospitalized in November 2017 for elective ventral hernia repair that was complicated by acute blood loss with significant rectal sheath hematoma. These happen when the patient was being bridged with Lovenox. This is now a second occasion when this happened and patient was advised not to use Lovenox injections in the future. Her bleeding required use of FFP's and vitamin K.  Also, per Dr. Shari Prows OV Note on 01/27/21 it states: Continue warfarin for [facility]; CANNOT TOLERATE LOVENOX bridge; will need heparin gtt for bridging for future procedures

## 2023-08-03 NOTE — Telephone Encounter (Signed)
Called pt regarding preop clearance. Pt was advise to call her Surgeon's office and have them fax a clearance request to our office. Pt agreed.

## 2023-09-08 ENCOUNTER — Ambulatory Visit: Payer: PPO | Attending: Cardiovascular Disease | Admitting: *Deleted

## 2023-09-08 DIAGNOSIS — Z952 Presence of prosthetic heart valve: Secondary | ICD-10-CM | POA: Diagnosis not present

## 2023-09-08 DIAGNOSIS — Z8673 Personal history of transient ischemic attack (TIA), and cerebral infarction without residual deficits: Secondary | ICD-10-CM

## 2023-09-08 DIAGNOSIS — Z7901 Long term (current) use of anticoagulants: Secondary | ICD-10-CM

## 2023-09-08 LAB — POCT INR: INR: 1.7 — AB (ref 2.0–3.0)

## 2023-09-08 NOTE — Patient Instructions (Addendum)
 Description   Today take 1.5 tablets then continue taking Warfarin 1 tablet daily except 1/2 tablet on Sundays. Recheck INR in 4 weeks. Coumadin Clinic [phone] or [phone]

## 2023-09-09 DIAGNOSIS — N1831 Chronic kidney disease, stage 3a: Secondary | ICD-10-CM | POA: Diagnosis not present

## 2023-09-09 DIAGNOSIS — E669 Obesity, unspecified: Secondary | ICD-10-CM | POA: Diagnosis not present

## 2023-09-09 DIAGNOSIS — K219 Gastro-esophageal reflux disease without esophagitis: Secondary | ICD-10-CM | POA: Diagnosis not present

## 2023-09-09 DIAGNOSIS — I5032 Chronic diastolic (congestive) heart failure: Secondary | ICD-10-CM | POA: Diagnosis not present

## 2023-09-09 DIAGNOSIS — M810 Age-related osteoporosis without current pathological fracture: Secondary | ICD-10-CM | POA: Diagnosis not present

## 2023-09-09 DIAGNOSIS — I2510 Atherosclerotic heart disease of native coronary artery without angina pectoris: Secondary | ICD-10-CM | POA: Diagnosis not present

## 2023-09-09 DIAGNOSIS — I700 Atherosclerosis of aorta: Secondary | ICD-10-CM | POA: Diagnosis not present

## 2023-09-09 DIAGNOSIS — E785 Hyperlipidemia, unspecified: Secondary | ICD-10-CM | POA: Diagnosis not present

## 2023-09-09 DIAGNOSIS — I130 Hypertensive heart and chronic kidney disease with heart failure and stage 1 through stage 4 chronic kidney disease, or unspecified chronic kidney disease: Secondary | ICD-10-CM | POA: Diagnosis not present

## 2023-09-09 DIAGNOSIS — Z6830 Body mass index (BMI) 30.0-30.9, adult: Secondary | ICD-10-CM | POA: Diagnosis not present

## 2023-09-09 DIAGNOSIS — I69354 Hemiplegia and hemiparesis following cerebral infarction affecting left non-dominant side: Secondary | ICD-10-CM | POA: Diagnosis not present

## 2023-09-09 DIAGNOSIS — Z713 Dietary counseling and surveillance: Secondary | ICD-10-CM | POA: Diagnosis not present

## 2023-09-21 ENCOUNTER — Other Ambulatory Visit: Payer: Self-pay | Admitting: Internal Medicine

## 2023-09-21 DIAGNOSIS — Z1231 Encounter for screening mammogram for malignant neoplasm of breast: Secondary | ICD-10-CM

## 2023-09-30 ENCOUNTER — Telehealth: Payer: Self-pay | Admitting: *Deleted

## 2023-09-30 DIAGNOSIS — K219 Gastro-esophageal reflux disease without esophagitis: Secondary | ICD-10-CM | POA: Insufficient documentation

## 2023-09-30 NOTE — Telephone Encounter (Signed)
 Pt has been scheduled tele preop appt 10/07/23. Med rec and consent are done.      Patient Consent for Virtual Visit        Elizabeth Morton has provided verbal consent on 09/30/2023 for a virtual visit (video or telephone).   CONSENT FOR VIRTUAL VISIT FOR:  Elizabeth Morton  By participating in this virtual visit I agree to the following:  I hereby voluntarily request, consent and authorize [facility] and its employed or contracted physicians, physician assistants, nurse practitioners or other licensed health care professionals (the Practitioner), to provide me with telemedicine health care services (the "Services") as deemed necessary by the treating Practitioner. I acknowledge and consent to receive the Services by the Practitioner via telemedicine. I understand that the telemedicine visit will involve communicating with the Practitioner through live audiovisual communication technology and the disclosure of certain medical information by electronic transmission. I acknowledge that I have been given the opportunity to request an in-person assessment or other available alternative prior to the telemedicine visit and am voluntarily participating in the telemedicine visit.  I understand that I have the right to withhold or withdraw my consent to the use of telemedicine in the course of my care at any time, without affecting my right to future care or treatment, and that the Practitioner or I may terminate the telemedicine visit at any time. I understand that I have the right to inspect all information obtained and/or recorded in the course of the telemedicine visit and may receive copies of available information for a reasonable fee.  I understand that some of the potential risks of receiving the Services via telemedicine include:  Delay or interruption in medical evaluation due to technological equipment failure or disruption; Information transmitted may not be sufficient (e.g. poor  resolution of images) to allow for appropriate medical decision making by the Practitioner; and/or  In rare instances, security protocols could fail, causing a breach of personal health information.  Furthermore, I acknowledge that it is my responsibility to provide information about my medical history, conditions and care that is complete and accurate to the best of my ability. I acknowledge that Practitioner's advice, recommendations, and/or decision may be based on factors not within their control, such as incomplete or inaccurate data provided by me or distortions of diagnostic images or specimens that may result from electronic transmissions. I understand that the practice of medicine is not an exact science and that Practitioner makes no warranties or guarantees regarding treatment outcomes. I acknowledge that a copy of this consent can be made available to me via my patient portal [facility] MyChart), or I can request a printed copy by calling the office of [facility].    I understand that my insurance will be billed for this visit.   I have read or had this consent read to me. I understand the contents of this consent, which adequately explains the benefits and risks of the Services being provided via telemedicine.  I have been provided ample opportunity to ask questions regarding this consent and the Services and have had my questions answered to my satisfaction. I give my informed consent for the services to be provided through the use of telemedicine in my medical care

## 2023-09-30 NOTE — Telephone Encounter (Signed)
 Patient with diagnosis of AVR on warfarin for anticoagulation.    Procedure: Lap Ventral Hernia Repair with Mesh  Date of procedure: TBD   CrCl 49 ml/min Platelet count 309K  Also, per Dr. Shari Prows OV Note on 01/27/21 it states: Continue warfarin for [facility]; CANNOT TOLERATE LOVENOX bridge; will need heparin gtt for bridging for future procedures. Hospitalized twice for bleeding with Lovenox.  Patient will need to be admitted and placed on heparin drip  **This guidance is not considered finalized until pre-operative APP has relayed final recommendations.**

## 2023-09-30 NOTE — Telephone Encounter (Signed)
   Name: Elizabeth Morton  DOB: March 13, 1956  MRN: [id number]  Primary Cardiologist: None   Preoperative team, please contact this patient and set up a phone call appointment for further preoperative risk assessment. Please obtain consent and complete medication review. Thank you for your help.  I confirm that guidance regarding antiplatelet and oral anticoagulation therapy has been completed and, if necessary, noted below.  Patient with diagnosis of AVR on warfarin for anticoagulation.     Procedure: Lap Ventral Hernia Repair with Mesh  Date of procedure: TBD     CrCl 49 ml/min Platelet count 309K   Also, per Dr. Shari Prows OV Note on 01/27/21 it states: Continue warfarin for [facility]; CANNOT TOLERATE LOVENOX bridge; will need heparin gtt for bridging for future procedures. Hospitalized twice for bleeding with Lovenox.   Patient will need to be admitted and placed on heparin drip  I also confirmed the patient resides in the state of [place]. As per [facility] Medical Board telemedicine laws, the patient must reside in the state in which the provider is licensed.   Ronney Asters, NP 09/30/2023, 11:21 AM [facility]

## 2023-09-30 NOTE — Telephone Encounter (Signed)
 Pt has been scheduled tele preop appt 10/07/23. Med rec and consent are done.

## 2023-09-30 NOTE — Telephone Encounter (Signed)
 Requesting office sent duplicate request inquiring if pt has been cleared. Hand written note on form today states: appt on 12/4 with Dr. Tenny Craw, Echo on 1/7

## 2023-09-30 NOTE — Telephone Encounter (Signed)
    Pre-operative Risk Assessment    Patient Name: Elizabeth Morton  DOB: 1955/07/19 MRN: [id number]      Request for Surgical Clearance    Procedure:  Lap Ventral Hernia Repair with Mesh  Date of Surgery:  Clearance TBD                                 Surgeon:  Dr. Star Age Surgeon's Group or Practice Name:  [facility] Phone number:  [phone] Fax number:  [phone]   Type of Clearance Requested:  Pharmacy and medical    Type of Anesthesia:  General    Additional requests/questions:   Would like to know if they can hold the Warfarin. [name] would like an update on the progress of the clearance request. Please advise.  [name]   09/30/2023, 10:01 AM

## 2023-10-06 ENCOUNTER — Ambulatory Visit: Payer: PPO | Attending: Cardiovascular Disease | Admitting: *Deleted

## 2023-10-06 DIAGNOSIS — Z952 Presence of prosthetic heart valve: Secondary | ICD-10-CM | POA: Diagnosis not present

## 2023-10-06 DIAGNOSIS — Z7901 Long term (current) use of anticoagulants: Secondary | ICD-10-CM | POA: Diagnosis not present

## 2023-10-06 DIAGNOSIS — Z8673 Personal history of transient ischemic attack (TIA), and cerebral infarction without residual deficits: Secondary | ICD-10-CM | POA: Diagnosis not present

## 2023-10-06 LAB — POCT INR: INR: 2.2 (ref 2.0–3.0)

## 2023-10-06 NOTE — Patient Instructions (Addendum)
 Description   Continue taking Warfarin 1 tablet daily except 1/2 tablet on Sundays. Recheck INR in 6 weeks. Coumadin Clinic [phone] or [phone]      New Address: [address] [place] [address]

## 2023-10-07 ENCOUNTER — Ambulatory Visit: Attending: Physician Assistant

## 2023-10-07 DIAGNOSIS — Z01810 Encounter for preprocedural cardiovascular examination: Secondary | ICD-10-CM

## 2023-10-07 NOTE — Progress Notes (Signed)
 Virtual Visit via Telephone Note   Because of Elizabeth Morton co-morbid illnesses, she is at least at moderate risk for complications without adequate follow up.  This format is felt to be most appropriate for this patient at this time.  Due to technical limitations with video connection Web designer), today's appointment will be conducted as an audio only telehealth visit, and OPHIA SHAMOON verbally agreed to proceed in this manner.   All issues noted in this document were discussed and addressed.  No physical exam could be performed with this format.  Evaluation Performed:  Preoperative cardiovascular risk assessment _____________   Date:  10/07/2023   Patient ID:  Elizabeth Morton, DOB Jan 27, 1956, MRN [id number] Patient Location:  Home Provider location:   Office  Primary Care Provider:  Melida Quitter, MD Primary Cardiologist:  None  Chief Complaint / Patient Profile   68 y.o. 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  History of Present Illness    Elizabeth Morton is a 68 y.o. female who presents via audio/video conferencing for a telehealth visit today.  Pt was last seen in cardiology clinic on 06/16/23 by Dr. Tenny Craw.  At that time SHANTAY SONN was doing well.  The patient is now pending procedure as outlined above. Since her last visit, she tells me that she has had no issues with her heart since her surgery.  She has had no chest pain or shortness of breath.  Her INR is always been well-controlled when checked on Coumadin for  mechanical AVR.  She has had lots of trouble with Lovenox injections in the past and cannot tolerate.  Was readmitted with bleeding after a colonoscopy in the past.  Unfortunately, will require heparin drip when off Coumadin.  She does meet minimal METS requirement.  Also, per Dr. Shari Prows OV Note on 01/27/21 it states: Continue warfarin for [facility]; CANNOT TOLERATE LOVENOX bridge; will need heparin gtt for bridging for future procedures. Hospitalized twice for bleeding with Lovenox.   Patient will need to be admitted and placed on heparin drip  Past Medical History    Past Medical History:  Diagnosis Date   Aortic stenosis    Aortic valve replacement 2006   Bacteremia 01/19/2019   CAD (coronary artery disease)    a. s/p CABG 2006 at time of AVR.   CVA (cerebral infarction)    related to thrombosed aortic root aneurysm   Dyslipidemia    Gallstones    GERD (gastroesophageal reflux disease)    Heart murmur    Hx of CABG    2006,LIMA to LAD, SVG to diagonal   Hx of transfusion of packed red blood cells    Hypertension    Hypothyroidism    Iron deficiency anemia    Lower GI bleed 06/2015   Pericardial effusion    Insignificant small pericardial effusion seen on echo, November, 2013   Peripheral vascular disease (HCC) 06   blood clot -stroke   S/P aortic valve replacement    a. Bentall procedure,#21 [employer] mechanical valve conduit with reimplantation of the coronaries  2006   Status post colonoscopy with polypectomy    "bleeding; colonoscopy was ~ 06/18/2015"   Stroke [facility]) 2006   no deficits    Warfarin anticoagulation    coumadin therapy   Past Surgical History:  Procedure Laterality Date   AORTIC VALVE REPLACEMENT  2006   BACK SURGERY     CARDIAC CATHETERIZATION  ~ 2006   CARDIAC VALVE REPLACEMENT     CESAREAN SECTION  1985   COLONOSCOPY N/A 06/26/2015   Procedure: COLONOSCOPY;  Surgeon: Iva Boop, MD;  Location: [facility] ENDOSCOPY;  Service: Endoscopy;  Laterality: N/A;    COLONOSCOPY W/ BIOPSIES AND POLYPECTOMY  06/18/2015   CORONARY ARTERY BYPASS GRAFT  2006   ESOPHAGOGASTRODUODENOSCOPY  06/18/2015   INSERTION OF MESH  05/15/2016   Procedure: INSERTION OF MESH;  Surgeon: Luretha Murphy, MD;  Location: [facility] ORS;  Service: General;;   LAPAROSCOPIC ASSISTED VENTRAL HERNIA REPAIR N/A 05/15/2016   Procedure: LAPAROSCOPIC ASSISTED VENTRAL HERNIA REPAIR WITH MESH;  Surgeon: Luretha Murphy, MD;  Location: [facility] ORS;  Service: General;  Laterality: N/A;   LAPAROSCOPIC CHOLECYSTECTOMY SINGLE PORT  07/21/2012   Procedure: LAPAROSCOPIC CHOLECYSTECTOMY SINGLE PORT;  Surgeon: Ardeth Sportsman, MD;  Location: [facility] OR;  Service: General;  Laterality: N/A;   LUMBAR FUSION  ~ 1973   TEE WITHOUT CARDIOVERSION N/A 01/19/2019   Procedure: TRANSESOPHAGEAL ECHOCARDIOGRAM (TEE);  Surgeon: Lewayne Bunting, MD;  Location: [facility] ENDOSCOPY;  Service: Cardiovascular;  Laterality: N/A;   TONSILLECTOMY      Allergies  No Known Allergies  Home Medications    Prior to Admission medications   Medication Sig Start Date End Date Taking? Authorizing Provider  acetaminophen (TYLENOL) 500 MG tablet Take 500-1,000 mg by mouth every 6 (six) hours as needed (for pain or headaches).     [provider]  allopurinol (ZYLOPRIM) 100 MG tablet Take 100 mg by mouth daily.    [provider]  Calcium Carbonate-Vitamin D 600-400 MG-UNIT tablet Take 1 tablet by mouth 2 (two) times daily.    [provider]  colchicine 0.6 MG tablet Take 1 tablet (0.6 mg total) by mouth daily for 3 doses. Take 2 tablets right away, take the 2nd dose 1.5 hours later 09/15/21 09/18/21  Madelyn Brunner, DO  furosemide (LASIX) 20 MG tablet Take 20 mg by mouth daily. 04/18/17   [provider]  levothyroxine (SYNTHROID, LEVOTHROID) 50 MCG tablet Take 50 mcg by mouth daily before breakfast.    [provider]  losartan (COZAAR) 25 MG tablet Take 25 mg by mouth daily.    [provider]  Multiple  Vitamins-Minerals (PRESERVISION/LUTEIN PO) Take 1 capsule by mouth daily with breakfast.     [provider]  pantoprazole (PROTONIX) 40 MG tablet Take 40 mg by mouth daily. 06/01/23   [provider]  rosuvastatin (CRESTOR) 20 MG tablet Take 20 mg by mouth daily.    [provider]  warfarin (COUMADIN) 3 MG tablet Take 1 tablet daily except 1/2 tablet on Sundays or as directed by Anticoagulation Clinic. 06/16/23   Pricilla Riffle, MD    Physical Exam    Vital Signs:  AMIRRAH QUIGLEY does not have vital signs available for review today.  Given telephonic nature of communication, physical exam is limited. AAOx3. NAD. Normal affect.  Speech and respirations are unlabored.  Accessory Clinical Findings    None  Assessment & Plan    1.  Preoperative Cardiovascular Risk Assessment:   Ms. Vilar's perioperative risk  of a major cardiac event is 11% according to the Revised Cardiac Risk Index (RCRI).  Therefore, she is at high risk for perioperative complications.   Her functional capacity is good at 5.07 METs according to the Duke Activity Status Index (DASI). Recommendations: According to ACC/AHA guidelines, no further cardiovascular testing needed.  The patient may proceed to surgery at acceptable risk.   Antiplatelet and/or Anticoagulation Recommendations:  The patient will require heparin bridging while off of Coumadin.  Please contact our office with the date of surgery so appropriate arrangements can be made.    The patient was advised that if she develops new symptoms prior to surgery to contact our office to arrange for a follow-up visit, and she verbalized understanding.   A copy of this note will be routed to requesting surgeon.  Time:   Today, I have spent 11 minutes with the patient with telehealth technology discussing medical history, symptoms, and management plan.     Sharlene Dory, PA-C  10/07/2023, 2:03 PM

## 2023-10-12 ENCOUNTER — Ambulatory Visit: Payer: Self-pay | Admitting: Surgery

## 2023-10-12 ENCOUNTER — Ambulatory Visit
Admission: RE | Admit: 2023-10-12 | Discharge: 2023-10-12 | Disposition: A | Discharge status: Home / Self Care | Source: Ambulatory Visit | Attending: Internal Medicine

## 2023-10-12 DIAGNOSIS — Z1231 Encounter for screening mammogram for malignant neoplasm of breast: Secondary | ICD-10-CM | POA: Diagnosis not present

## 2023-10-15 NOTE — Telephone Encounter (Addendum)
 I will forward back to the preop APP in regards to the note pt needs to be admitted. The preop team does not generally handle admissions. Not sure who this needs to be addressed with.   I will send to preop lead Ward Givens, FNP as for further advice.

## 2023-10-15 NOTE — Telephone Encounter (Signed)
 I will forward to preop APP for review of notes from today.

## 2023-10-15 NOTE — Telephone Encounter (Signed)
 Office calling to state that pt's procedure is scheduled for 4/10. They would like a c/b regarding Warfarin as soon as possible. Please advise

## 2023-10-18 NOTE — Progress Notes (Signed)
 COVID Vaccine Completed:  Date of COVID positive in last 90 days:  PCP - Dorinda Hill, MD Cardiologist - Dietrich Pates, MD  Cardiac clearance by Jari Favre, PA in Epic 10/07/23  Chest x-ray -  EKG - 06/16/23 Epic Stress Test -  ECHO - 07/20/23 Epic Cardiac Cath - 2006 Pacemaker/ICD device last checked: Spinal Cord Stimulator:  Bowel Prep -   Sleep Study -  CPAP -   Fasting Blood Sugar -  Checks Blood Sugar _____ times a day  Last dose of GLP1 agonist-  N/A GLP1 instructions:  Hold 7 days before surgery    Last dose of SGLT-2 inhibitors-  N/A SGLT-2 instructions:  Hold 3 days before surgery    Blood Thinner Instructions: Coumadin, hold 5 days Aspirin Instructions: Last Dose:  Activity level:  Can go up a flight of stairs and perform activities of daily living without stopping and without symptoms of chest pain or shortness of breath.  Able to exercise without symptoms  Unable to go up a flight of stairs without symptoms of     Anesthesia review: HTN, CAD, aortic stenosis, CHF, hep C,SOB, CVA, CABG x2, AVR  Patient denies shortness of breath, fever, cough and chest pain at PAT appointment  Patient verbalized understanding of instructions that were given to them at the PAT appointment. Patient was also instructed that they will need to review over the PAT instructions again at home before surgery.

## 2023-10-18 NOTE — Patient Instructions (Signed)
 SURGICAL WAITING ROOM VISITATION  Patients having surgery or a procedure may have no more than 2 support people in the waiting area - these visitors may rotate.    Children under the age of 52 must have an adult with them who is not the patient.  Due to an increase in RSV and influenza rates and associated hospitalizations, children ages 76 and under may not visit patients in [facility] hospitals.  Visitors with respiratory illnesses are discouraged from visiting and should remain at home.  If the patient needs to stay at the hospital during part of their recovery, the visitor guidelines for inpatient rooms apply. Pre-op nurse will coordinate an appropriate time for 1 support person to accompany patient in pre-op.  This support person may not rotate.    Please refer to the [facility] website for the visitor guidelines for Inpatients (after your surgery is over and you are in a regular room).    Your procedure is scheduled on: 10/21/23   Report to [facility] Main Entrance    Report to admitting at 9:45 AM   Call this number if you have problems the morning of surgery [phone]   Do not eat food :After Midnight.   After Midnight you may have the following liquids until 9:00 AM DAY OF SURGERY  Water Non-Citrus Juices (without pulp, NO RED-Apple, White grape, White cranberry) Black Coffee (NO MILK/CREAM OR CREAMERS, sugar ok)  Clear Tea (NO MILK/CREAM OR CREAMERS, sugar ok) regular and decaf                             Plain Jell-O (NO RED)                                           Fruit ices (not with fruit pulp, NO RED)                                     Popsicles (NO RED)                                                               Sports drinks like Gatorade (NO RED)              Drink 2 Ensure drinks AT 10:00 PM the night before surgery.        The day of surgery:  Drink ONE (1) Pre-Surgery Clear Ensure at 9:00 AM the morning of surgery. Drink in one sitting.  Do not sip.  This drink was given to you during your hospital  pre-op appointment visit. Nothing else to drink after completing the  Pre-Surgery Clear Ensure.          If you have questions, please contact your surgeon's office.   FOLLOW BOWEL PREP AND ANY ADDITIONAL PRE OP INSTRUCTIONS YOU RECEIVED FROM YOUR SURGEON'S OFFICE!!!     Oral Hygiene is also important to reduce your risk of infection.  Remember - BRUSH YOUR TEETH THE MORNING OF SURGERY WITH YOUR REGULAR TOOTHPASTE  DENTURES WILL BE REMOVED PRIOR TO SURGERY PLEASE DO NOT APPLY "Poly grip" OR ADHESIVES!!!   Stop all vitamins and herbal supplements 7 days before surgery.   Take these medicines the morning of surgery with A SIP OF WATER: Tylenol, Allopurinol, Levothyroxine, Pantoprazole, Rosuvastatin    Call your provider about blood thinner before surgery.                              You may not have any metal on your body including hair pins, jewelry, and body piercing             Do not wear make-up, lotions, powders, perfumes, or deodorant  Do not wear nail polish including gel and S&S, artificial/acrylic nails, or any other type of covering on natural nails including finger and toenails. If you have artificial nails, gel coating, etc. that needs to be removed by a nail salon please have this removed prior to surgery or surgery may need to be canceled/ delayed if the surgeon/ anesthesia feels like they are unable to be safely monitored.   Do not shave  48 hours prior to surgery.    Do not bring valuables to the hospital. [facility] IS NOT             RESPONSIBLE   FOR VALUABLES.   Contacts, glasses, dentures or bridgework may not be worn into surgery.   Bring small overnight bag day of surgery.   DO NOT BRING YOUR HOME MEDICATIONS TO THE HOSPITAL. PHARMACY WILL DISPENSE MEDICATIONS LISTED ON YOUR MEDICATION LIST TO YOU DURING YOUR ADMISSION IN THE HOSPITAL!              Please  read over the following fact sheets you were given: IF YOU HAVE QUESTIONS ABOUT YOUR PRE-OP INSTRUCTIONS PLEASE CALL [name]    If you received a COVID test during your pre-op visit  it is requested that you wear a mask when out in public, stay away from anyone that may not be feeling well and notify your surgeon if you develop symptoms. If you test positive for Covid or have been in contact with anyone that has tested positive in the last 10 days please notify you surgeon.    [place] - Preparing for Surgery Before surgery, you can play an important role.  Because skin is not sterile, your skin needs to be as free of germs as possible.  You can reduce the number of germs on your skin by washing with CHG (chlorahexidine gluconate) soap before surgery.  CHG is an antiseptic cleaner which kills germs and bonds with the skin to continue killing germs even after washing. Please DO NOT use if you have an allergy to CHG or antibacterial soaps.  If your skin becomes reddened/irritated stop using the CHG and inform your nurse when you arrive at Short Stay. Do not shave (including legs and underarms) for at least 48 hours prior to the first CHG shower.  You may shave your face/neck.  Please follow these instructions carefully:  1.  Shower with CHG Soap the night before surgery and the  morning of surgery.  2.  If you choose to wash your hair, wash your hair first as usual with your normal  shampoo.  3.  After you shampoo, rinse your hair and body thoroughly to remove the shampoo.  4.  Use CHG as you would any other liquid soap.  You can apply chg directly to the skin and wash.  Gently with a scrungie or clean washcloth.  5.  Apply the CHG Soap to your body ONLY FROM THE NECK DOWN.   Do   not use on face/ open                           Wound or open sores. Avoid contact with eyes, ears mouth and   genitals (private parts).                       Wash face,  Genitals  (private parts) with your normal soap.             6.  Wash thoroughly, paying special attention to the area where your    surgery  will be performed.  7.  Thoroughly rinse your body with warm water from the neck down.  8.  DO NOT shower/wash with your normal soap after using and rinsing off the CHG Soap.                9.  Pat yourself dry with a clean towel.            10.  Wear clean pajamas.            11.  Place clean sheets on your bed the night of your first shower and do not  sleep with pets. Day of Surgery : Do not apply any lotions/deodorants the morning of surgery.  Please wear clean clothes to the hospital/surgery center.  FAILURE TO FOLLOW THESE INSTRUCTIONS MAY RESULT IN THE CANCELLATION OF YOUR SURGERY  PATIENT SIGNATURE_________________________________  NURSE SIGNATURE__________________________________  ________________________________________________________________________

## 2023-10-18 NOTE — Telephone Encounter (Signed)
   Name: Elizabeth Morton  DOB: 08/26/1955  MRN: [id number]   Primary Cardiologist: None  Chart reviewed as part of pre-operative protocol coverage. [name] was evaluated for preoperative cardiac risk via telephone visit on 10/07/2023 by Jari Favre, PA-C.  Per televisit note: "[name]'s perioperative risk of a major cardiac event is 11% according to the Revised Cardiac Risk Index (RCRI).  Therefore, she is at high risk for perioperative complications.   Her functional capacity is good at 5.07 METs according to the Duke Activity Status Index (DASI). Recommendations: According to ACC/AHA guidelines, no further cardiovascular testing needed.  The patient may proceed to surgery at acceptable risk.   Antiplatelet and/or Anticoagulation Recommendations:   The patient will require heparin bridging while off of Coumadin."   This will require the patient to be admitted prior to procedure. Coordination of the admission and heparin drip is coordinated with the surgical team and hospitalists.   I will route this recommendation to the requesting party via Epic fax function and remove from pre-op pool. Please call with questions.  Carlos Levering, NP 10/18/2023, 9:39 AM

## 2023-10-19 ENCOUNTER — Encounter (HOSPITAL_COMMUNITY)
Admission: RE | Admit: 2023-10-19 | Discharge: 2023-10-19 | Disposition: A | Discharge status: Home / Self Care | Source: Ambulatory Visit | Attending: Surgery | Admitting: Surgery

## 2023-10-19 ENCOUNTER — Other Ambulatory Visit: Payer: Self-pay

## 2023-10-19 ENCOUNTER — Telehealth: Payer: Self-pay | Admitting: Internal Medicine

## 2023-10-19 ENCOUNTER — Encounter (HOSPITAL_COMMUNITY): Payer: Self-pay

## 2023-10-19 VITALS — BP 137/69 | HR 77 | Temp 97.6°F | Resp 12 | Ht 62.0 in | Wt 170.0 lb

## 2023-10-19 DIAGNOSIS — B182 Chronic viral hepatitis C: Secondary | ICD-10-CM | POA: Diagnosis not present

## 2023-10-19 DIAGNOSIS — Z8673 Personal history of transient ischemic attack (TIA), and cerebral infarction without residual deficits: Secondary | ICD-10-CM | POA: Diagnosis not present

## 2023-10-19 DIAGNOSIS — Z01812 Encounter for preprocedural laboratory examination: Secondary | ICD-10-CM | POA: Diagnosis not present

## 2023-10-19 HISTORY — DX: Gout, unspecified: M10.9

## 2023-10-19 HISTORY — DX: Dyspnea, unspecified: R06.00

## 2023-10-19 HISTORY — DX: Personal history of urinary calculi: Z87.442

## 2023-10-19 HISTORY — DX: Pneumonia, unspecified organism: J18.9

## 2023-10-19 LAB — COMPREHENSIVE METABOLIC PANEL WITH GFR
ALT: 14 U/L (ref 0–44)
AST: 20 U/L (ref 15–41)
Albumin: 4.6 g/dL (ref 3.5–5.0)
Alkaline Phosphatase: 98 U/L (ref 38–126)
Anion gap: 11 (ref 5–15)
BUN: 17 mg/dL (ref 8–23)
CO2: 24 mmol/L (ref 22–32)
Calcium: 9.7 mg/dL (ref 8.9–10.3)
Chloride: 100 mmol/L (ref 98–111)
Creatinine, Ser: 1.37 mg/dL — ABNORMAL HIGH (ref 0.44–1.00)
GFR, Estimated: 43 mL/min — ABNORMAL LOW (ref >60)
Glucose, Bld: 102 mg/dL — ABNORMAL HIGH (ref 70–99)
Potassium: 3.9 mmol/L (ref 3.5–5.1)
Sodium: 135 mmol/L (ref 135–145)
Total Bilirubin: 0.6 mg/dL (ref 0.0–1.2)
Total Protein: 7.7 g/dL (ref 6.5–8.1)

## 2023-10-19 LAB — CBC
HCT: 43.7 % (ref 36.0–46.0)
Hemoglobin: 14.5 g/dL (ref 12.0–15.0)
MCH: 30 pg (ref 26.0–34.0)
MCHC: 33.2 g/dL (ref 30.0–36.0)
MCV: 90.3 fL (ref 80.0–100.0)
Platelets: 293 10*3/uL (ref 150–400)
RBC: 4.84 MIL/uL (ref 3.87–5.11)
RDW: 14.2 % (ref 11.5–15.5)
WBC: 5.9 10*3/uL (ref 4.0–10.5)
nRBC: 0 % (ref 0.0–0.2)

## 2023-10-19 NOTE — Telephone Encounter (Signed)
 Scheduler: Ardyth Gal I have [facility] on the phone about this pt. She said she needs to speak to someone or they are going to have to cx her surgery.    Me: probably needs to be handled by the preop APP. He may be on the phone with a pt right now and he may need to call back. preop APP has notes about the pt being admitted

## 2023-10-19 NOTE — Telephone Encounter (Signed)
 Elizabeth Morton said she needs a call back as soon as possible about the heparin bridge or they need to r/s pt's surgery.

## 2023-10-19 NOTE — Telephone Encounter (Addendum)
 Spoke with [facility] triage nurse and explained that patient will require admission prior to procedure. Coordination of the admission and heparin drip is coordinated with the surgical team and hospitalists. This was noted on clearance form from 08/03/2023. They were noted to call back if any further questions.   Did confirm with clinical pharmacist Laural Golden.

## 2023-10-21 ENCOUNTER — Encounter (HOSPITAL_COMMUNITY): Admission: RE | Payer: Self-pay | Source: Ambulatory Visit

## 2023-10-21 ENCOUNTER — Inpatient Hospital Stay (HOSPITAL_COMMUNITY): Admission: RE | Admit: 2023-10-21 | Source: Ambulatory Visit | Admitting: Surgery

## 2023-10-21 ENCOUNTER — Encounter (HOSPITAL_COMMUNITY): Payer: Self-pay | Admitting: Physician Assistant

## 2023-10-21 SURGERY — REPAIR, HERNIA, VENTRAL, LAPAROSCOPIC
Anesthesia: General

## 2023-10-22 ENCOUNTER — Telehealth: Payer: Self-pay

## 2023-10-22 NOTE — Telephone Encounter (Signed)
 What I recommend is that patient start holding warfarin 4/26 (last dose 4/25). She should come in Monday AM to coumadin clinic to have her INR checked. If her INR is <2 then she should be admitted for heparin drip. If it is not <2, then we would repeat the same thing the next day.  What we need to know is who to contact so that hospitalist will be made award of the need to admit and need to put in heparin per pharmacy orders.

## 2023-10-22 NOTE — Telephone Encounter (Signed)
 Staff message sent to our Pharm D re:   From Dr Tenny Craw:  Need help to have someone reach out to surgery re: coordinating with pts admission and getting on heparin from pharmacy     Pt is having a hernia repair 11/11/23.

## 2023-10-22 NOTE — Telephone Encounter (Signed)
 I reviewed instructions with patient below. She will come to coumadin clinic for INR check 4/28. If INR <2, the coumadin clinic will let me know and I will contact Dr. Tenny Craw for her to coordinate with hospitalist for patient admission to [facility] for heparin drip. I apologized to the patient for the delay in her surgery and confusion around her warfarin.

## 2023-10-22 NOTE — Telephone Encounter (Signed)
 Morton, Elizabeth, RPH  You; Dietrich Pates V, MD3 minutes ago (11:15 AM)    Recommendation remains the same, hold warfarin for 5 days, admit, and begin heparin drip. Do not know who handles that. Last dose of warfarin is 4/25

## 2023-10-22 NOTE — Telephone Encounter (Signed)
 Noted she has an Anticoagulation Clinic appointment set for 11/08/23 and the note on the appointment regarding the upcoming procedure.  Thanks for the update and reaching out to the patient regarding this.

## 2023-11-02 ENCOUNTER — Telehealth: Payer: Self-pay | Admitting: *Deleted

## 2023-11-02 ENCOUNTER — Other Ambulatory Visit: Payer: Self-pay | Admitting: Internal Medicine

## 2023-11-02 DIAGNOSIS — Z952 Presence of prosthetic heart valve: Secondary | ICD-10-CM

## 2023-11-02 DIAGNOSIS — Z7901 Long term (current) use of anticoagulants: Secondary | ICD-10-CM

## 2023-11-02 NOTE — Telephone Encounter (Signed)
 Warfarin 3mg  refill S/P aortic valve replacement  Last INR 10/06/23 Last OV 10/07/23 via Televisit

## 2023-11-02 NOTE — Telephone Encounter (Signed)
 Patient called to inquire about a pre surgery ensure that she was given and if she needs to take it since she does not know if she will be admitted or not the day she comes to have her INR checked. Advised I would ask Alisha with Dr. Vann Gent office and spoke with her and she stated she would need to follow the pre-op instructions and drink it. Asked if she needed more where she would get it from in the event she was not admitted and she advised that the preop personnel would be able to assist with more if needed.   Advised pt on the above and that since the surgical date has changed and admission is suppose to be 11/11/23 with admission on 11/08/23 may need to clarify with preop who gave the drinks. She stated she would and I will follow up with her.   4/24 at 1020am, spoke with Kayleen Party and she confirmed that the pt does not need to drink the pre surgery ensure drink since she will be admitted. Spoke with pt and she is aware as she did call them yesterday and was thankful that I followed up as well.

## 2023-11-08 ENCOUNTER — Ambulatory Visit: Attending: Cardiovascular Disease | Admitting: *Deleted

## 2023-11-08 ENCOUNTER — Inpatient Hospital Stay (HOSPITAL_COMMUNITY)
Admission: AD | Admit: 2023-11-08 | Discharge: 2023-11-16 | DRG: 353 | Disposition: A | Discharge status: Home / Self Care | Attending: Family Medicine | Admitting: Family Medicine

## 2023-11-08 ENCOUNTER — Other Ambulatory Visit: Payer: Self-pay

## 2023-11-08 ENCOUNTER — Encounter (HOSPITAL_COMMUNITY): Payer: Self-pay

## 2023-11-08 DIAGNOSIS — E669 Obesity, unspecified: Secondary | ICD-10-CM | POA: Diagnosis present

## 2023-11-08 DIAGNOSIS — Z87891 Personal history of nicotine dependence: Secondary | ICD-10-CM

## 2023-11-08 DIAGNOSIS — Z7901 Long term (current) use of anticoagulants: Secondary | ICD-10-CM

## 2023-11-08 DIAGNOSIS — Z951 Presence of aortocoronary bypass graft: Secondary | ICD-10-CM

## 2023-11-08 DIAGNOSIS — Z8673 Personal history of transient ischemic attack (TIA), and cerebral infarction without residual deficits: Secondary | ICD-10-CM | POA: Diagnosis not present

## 2023-11-08 DIAGNOSIS — K660 Peritoneal adhesions (postprocedural) (postinfection): Secondary | ICD-10-CM | POA: Diagnosis not present

## 2023-11-08 DIAGNOSIS — R0989 Other specified symptoms and signs involving the circulatory and respiratory systems: Secondary | ICD-10-CM | POA: Diagnosis not present

## 2023-11-08 DIAGNOSIS — Z5181 Encounter for therapeutic drug level monitoring: Secondary | ICD-10-CM | POA: Diagnosis not present

## 2023-11-08 DIAGNOSIS — I739 Peripheral vascular disease, unspecified: Secondary | ICD-10-CM | POA: Diagnosis not present

## 2023-11-08 DIAGNOSIS — K430 Incisional hernia with obstruction, without gangrene: Principal | ICD-10-CM | POA: Diagnosis present

## 2023-11-08 DIAGNOSIS — Z952 Presence of prosthetic heart valve: Secondary | ICD-10-CM

## 2023-11-08 DIAGNOSIS — I130 Hypertensive heart and chronic kidney disease with heart failure and stage 1 through stage 4 chronic kidney disease, or unspecified chronic kidney disease: Secondary | ICD-10-CM | POA: Diagnosis present

## 2023-11-08 DIAGNOSIS — I5032 Chronic diastolic (congestive) heart failure: Secondary | ICD-10-CM | POA: Diagnosis not present

## 2023-11-08 DIAGNOSIS — Z6833 Body mass index (BMI) 33.0-33.9, adult: Secondary | ICD-10-CM

## 2023-11-08 DIAGNOSIS — E785 Hyperlipidemia, unspecified: Secondary | ICD-10-CM | POA: Diagnosis not present

## 2023-11-08 DIAGNOSIS — N179 Acute kidney failure, unspecified: Secondary | ICD-10-CM | POA: Diagnosis not present

## 2023-11-08 DIAGNOSIS — Z79890 Hormone replacement therapy (postmenopausal): Secondary | ICD-10-CM | POA: Diagnosis not present

## 2023-11-08 DIAGNOSIS — E876 Hypokalemia: Secondary | ICD-10-CM | POA: Diagnosis not present

## 2023-11-08 DIAGNOSIS — K219 Gastro-esophageal reflux disease without esophagitis: Secondary | ICD-10-CM | POA: Diagnosis not present

## 2023-11-08 DIAGNOSIS — I10 Essential (primary) hypertension: Secondary | ICD-10-CM | POA: Diagnosis not present

## 2023-11-08 DIAGNOSIS — K439 Ventral hernia without obstruction or gangrene: Principal | ICD-10-CM | POA: Diagnosis present

## 2023-11-08 DIAGNOSIS — N1831 Chronic kidney disease, stage 3a: Secondary | ICD-10-CM | POA: Diagnosis not present

## 2023-11-08 DIAGNOSIS — K432 Incisional hernia without obstruction or gangrene: Principal | ICD-10-CM | POA: Diagnosis present

## 2023-11-08 DIAGNOSIS — Z8719 Personal history of other diseases of the digestive system: Secondary | ICD-10-CM

## 2023-11-08 DIAGNOSIS — I350 Nonrheumatic aortic (valve) stenosis: Secondary | ICD-10-CM | POA: Diagnosis not present

## 2023-11-08 DIAGNOSIS — J9601 Acute respiratory failure with hypoxia: Secondary | ICD-10-CM | POA: Diagnosis not present

## 2023-11-08 DIAGNOSIS — I2510 Atherosclerotic heart disease of native coronary artery without angina pectoris: Secondary | ICD-10-CM | POA: Diagnosis not present

## 2023-11-08 DIAGNOSIS — R0902 Hypoxemia: Secondary | ICD-10-CM | POA: Diagnosis not present

## 2023-11-08 DIAGNOSIS — K449 Diaphragmatic hernia without obstruction or gangrene: Secondary | ICD-10-CM | POA: Diagnosis not present

## 2023-11-08 DIAGNOSIS — R918 Other nonspecific abnormal finding of lung field: Secondary | ICD-10-CM | POA: Diagnosis not present

## 2023-11-08 DIAGNOSIS — M109 Gout, unspecified: Secondary | ICD-10-CM | POA: Diagnosis present

## 2023-11-08 DIAGNOSIS — E039 Hypothyroidism, unspecified: Secondary | ICD-10-CM | POA: Diagnosis present

## 2023-11-08 DIAGNOSIS — E662 Morbid (severe) obesity with alveolar hypoventilation: Secondary | ICD-10-CM | POA: Diagnosis not present

## 2023-11-08 DIAGNOSIS — M1A9XX0 Chronic gout, unspecified, without tophus (tophi): Secondary | ICD-10-CM | POA: Diagnosis not present

## 2023-11-08 LAB — POCT INR: INR: 1.5 — AB (ref 2.0–3.0)

## 2023-11-08 MED ORDER — ALBUTEROL SULFATE (2.5 MG/3ML) 0.083% IN NEBU: 2.5000 mg | INHALATION_SOLUTION | RESPIRATORY_TRACT | Status: DC | PRN

## 2023-11-08 MED ORDER — FUROSEMIDE 20 MG PO TABS
20.0000 mg | ORAL_TABLET | Freq: Every day | ORAL | Status: DC
  Administered 2023-11-09 – 2023-11-10 (×2): 20 mg via ORAL
  Filled 2023-11-08 (×2): qty 1

## 2023-11-08 MED ORDER — HEPARIN (PORCINE) 25000 UT/250ML-% IV SOLN
850.0000 [IU]/h | INTRAVENOUS | Status: DC
  Administered 2023-11-08: 950 [IU]/h via INTRAVENOUS
  Administered 2023-11-09: 850 [IU]/h via INTRAVENOUS
  Filled 2023-11-08 (×2): qty 250

## 2023-11-08 MED ORDER — ALLOPURINOL 100 MG PO TABS
100.0000 mg | ORAL_TABLET | Freq: Every day | ORAL | Status: DC
  Administered 2023-11-08 – 2023-11-16 (×9): 100 mg via ORAL
  Filled 2023-11-08 (×9): qty 1

## 2023-11-08 MED ORDER — ONDANSETRON HCL 4 MG/2ML IJ SOLN
4.0000 mg | Freq: Four times a day (QID) | INTRAMUSCULAR | Status: DC | PRN
  Administered 2023-11-12: 4 mg via INTRAVENOUS
  Filled 2023-11-08 (×2): qty 2

## 2023-11-08 MED ORDER — ROSUVASTATIN CALCIUM 20 MG PO TABS
20.0000 mg | ORAL_TABLET | Freq: Every day | ORAL | Status: DC
  Administered 2023-11-09 – 2023-11-16 (×8): 20 mg via ORAL
  Filled 2023-11-08 (×4): qty 1
  Filled 2023-11-08 (×2): qty 2
  Filled 2023-11-08: qty 1
  Filled 2023-11-08: qty 2

## 2023-11-08 MED ORDER — PANTOPRAZOLE SODIUM 40 MG PO TBEC
40.0000 mg | DELAYED_RELEASE_TABLET | Freq: Every day | ORAL | Status: DC
  Administered 2023-11-08 – 2023-11-16 (×9): 40 mg via ORAL
  Filled 2023-11-08 (×10): qty 1

## 2023-11-08 MED ORDER — HEPARIN BOLUS VIA INFUSION
3000.0000 [IU] | Freq: Once | INTRAVENOUS | Status: AC
  Administered 2023-11-08: 3000 [IU] via INTRAVENOUS
  Filled 2023-11-08: qty 3000

## 2023-11-08 MED ORDER — LOSARTAN POTASSIUM 50 MG PO TABS
50.0000 mg | ORAL_TABLET | Freq: Every day | ORAL | Status: DC
  Administered 2023-11-09 – 2023-11-10 (×2): 50 mg via ORAL
  Filled 2023-11-08 (×2): qty 1

## 2023-11-08 NOTE — H&P (Signed)
 History and Physical  Elizabeth Morton ZOX:[id number] DOB: 07/31/1955 DOA: 11/08/2023  PCP: Azalia Leo, MD   Chief Complaint: Subtherapeutic INR  HPI: Elizabeth Morton is a 68 y.o. 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 Hershell Lose.  History provided by the patient, who tells me that she has history of multiple episodes of bleeding while on Lovenox  bridge when requiring surgery in the past.  As such, she was sent for preoperative clearance with cardiology also met with pharmacist at anticoagulation clinic and recommendation was made for hospitalist admission for heparin  bridge with anticipated hernia repair with Dr. Hershell Lose on 11/11/2023 at [facility].  She is doing well, and has no complaints.  Review of Systems: Please see HPI for pertinent positives and negatives. A complete 10 system review of systems are otherwise negative.  Past Medical History:  Diagnosis Date   Aortic stenosis    Aortic valve replacement 2006   Bacteremia 01/19/2019   CAD (coronary artery disease)    a. s/p CABG 2006 at time of AVR.   CVA (cerebral infarction)    related to thrombosed aortic root aneurysm   Dyslipidemia    Dyspnea    Gallstones    GERD (gastroesophageal reflux disease)    Gout    Heart murmur    History of kidney stones    Hx of CABG    2006,LIMA to LAD, SVG to diagonal   Hx of transfusion of packed red blood cells    Hypertension    Hypothyroidism    Iron deficiency anemia    Lower GI bleed 06/2015   Pericardial effusion    Insignificant small pericardial effusion seen on echo, November, 2013   Peripheral vascular disease (HCC) 2006   blood clot -stroke   Pneumonia    S/P aortic valve replacement    a. Bentall procedure,#21 St. Jude mechanical valve conduit with  reimplantation of the coronaries 2006   Status post colonoscopy with polypectomy    "bleeding; colonoscopy was ~ 06/18/2015"   Stroke [facility]) 2006   no deficits    Warfarin anticoagulation    coumadin  therapy   Past Surgical History:  Procedure Laterality Date   AORTIC VALVE REPLACEMENT  2006   BACK SURGERY     CARDIAC CATHETERIZATION  ~ 2006   CARDIAC VALVE REPLACEMENT     CESAREAN SECTION  1985   COLONOSCOPY N/A 06/26/2015   Procedure: COLONOSCOPY;  Surgeon: Kenney Peacemaker, MD;  Location: [facility] ENDOSCOPY;  Service: Endoscopy;  Laterality: N/A;   COLONOSCOPY W/ BIOPSIES AND POLYPECTOMY  06/18/2015   CORONARY ARTERY BYPASS GRAFT  2006   ESOPHAGOGASTRODUODENOSCOPY  06/18/2015   INSERTION OF MESH  05/15/2016   Procedure: INSERTION OF MESH;  Surgeon: Jacolyn Matar, MD;  Location: [facility] ORS;  Service: General;;   LAPAROSCOPIC ASSISTED VENTRAL HERNIA REPAIR N/A 05/15/2016   Procedure: LAPAROSCOPIC ASSISTED VENTRAL HERNIA REPAIR WITH MESH;  Surgeon: Jacolyn Matar, MD;  Location: [facility] ORS;  Service: General;  Laterality: N/A;   LAPAROSCOPIC CHOLECYSTECTOMY SINGLE PORT  07/21/2012   Procedure: LAPAROSCOPIC CHOLECYSTECTOMY SINGLE PORT;  Surgeon: Eddye Goodie, MD;  Location: [facility] OR;  Service: General;  Laterality: N/A;   LUMBAR FUSION  ~ 1973   TEE WITHOUT CARDIOVERSION N/A 01/19/2019   Procedure: TRANSESOPHAGEAL ECHOCARDIOGRAM (TEE);  Surgeon: Lenise Quince,  MD;  Location: [facility] ENDOSCOPY;  Service: Cardiovascular;  Laterality: N/A;   TONSILLECTOMY     Social History:  reports that she quit smoking about 19 years ago. Her smoking use included cigarettes. She started smoking about 44 years ago. She has a 25 pack-year smoking history. She has never used smokeless tobacco. She reports that she does not drink alcohol and does not use drugs.  No Known Allergies  Family History  Problem Relation Age of Onset   Stroke Mother    Heart disease Mother    Alcohol abuse Father    Hypertension Father    Stroke  Father    Hyperlipidemia Father    Stroke Brother        she has three and at least one of them has had a stroke   Colon cancer Neg Hx    Breast cancer Neg Hx      Prior to Admission medications   Medication Sig Start Date End Date Taking? Authorizing Provider  acetaminophen  (TYLENOL ) 500 MG tablet Take 500-1,000 mg by mouth every 6 (six) hours as needed for headache, moderate pain (pain score 4-6) or mild pain (pain score 1-3) (Sleep).    [provider]  allopurinol (ZYLOPRIM) 100 MG tablet Take 100 mg by mouth daily.    [provider]  Calcium  Carbonate-Vitamin D 600-400 MG-UNIT tablet Take 1 tablet by mouth 2 (two) times daily.    [provider]  colchicine  0.6 MG tablet Take 1 tablet (0.6 mg total) by mouth daily for 3 doses. Take 2 tablets right away, take the 2nd dose 1.5 hours later Patient taking differently: Take 0.6 mg by mouth See admin instructions.  For flare-up Take 2 tablets right away, take the 2nd dose 1.5 hours later 09/15/21 10/14/23  Brooks, Dana, DO  furosemide  (LASIX ) 20 MG tablet Take 20 mg by mouth daily. 04/18/17   [provider]  levothyroxine  (SYNTHROID ) 75 MCG tablet Take 75 mcg by mouth daily before breakfast.    [provider]  losartan (COZAAR) 25 MG tablet Take 50 mg by mouth daily.    [provider]  Multiple Vitamins-Minerals (PRESERVISION/LUTEIN PO) Take 1 capsule by mouth daily with breakfast.     [provider]  naphazoline-glycerin (CLEAR EYES REDNESS) 0.012-0.25 % SOLN Place 1-2 drops into both eyes 4 (four) times daily as needed for eye irritation.    [provider]  pantoprazole  (PROTONIX ) 40 MG tablet Take 40 mg by mouth daily. 06/01/23   [provider]  rosuvastatin (CRESTOR) 20 MG tablet Take 20 mg by mouth daily.    [provider]  warfarin (COUMADIN ) 3 MG tablet TAKE 1 TABLET DAILY EXCEPT 1/2 TABLET ON SUNDAYS OR AS DIRECTED BY ANTICOAGULATION CLINIC.  11/02/23   Elmyra Haggard, MD    Physical Exam: BP (!) 148/64 (BP Location: Left Arm)   Pulse 82   Temp 97.6 F (36.4 C) (Oral)   SpO2 99%  General:  Alert, oriented, calm, in no acute distress  Eyes: EOMI, clear conjuctivae, white sclerea Neck: supple, no masses, trachea mildline  Cardiovascular: RRR, no murmurs or rubs, no peripheral edema  Respiratory: clear to auscultation bilaterally, no wheezes, no crackles  Abdomen: soft, nontender, nondistended, normal bowel tones heard, palpable hernia Skin: dry, no rashes  Musculoskeletal: no joint effusions, normal range of motion  Psychiatric: appropriate affect, normal speech  Neurologic: extraocular muscles intact, clear speech, moving all extremities with intact sensorium  Labs on Admission:  Basic Metabolic Panel: No results for input(s): "NA", "K", "CL", "CO2", "GLUCOSE", "BUN", "CREATININE", "CALCIUM ", "MG", "PHOS" in the last 168 hours. Liver Function Tests: No results for input(s): "AST", "ALT", "ALKPHOS", "BILITOT", "PROT", "ALBUMIN" in the last 168 hours. No results for input(s): "LIPASE", "AMYLASE" in the last 168 hours. No results for input(s): "AMMONIA" in the last 168 hours. CBC: No results for input(s): "WBC", "NEUTROABS", "HGB", "HCT", "MCV", "PLT" in the last 168 hours. Cardiac Enzymes: No results for input(s): "CKTOTAL", "CKMB", "CKMBINDEX", "TROPONINI" in the last 168 hours. BNP (last 3 results) No results for input(s): "BNP" in the last 8760 hours.  ProBNP (last 3 results) No results for input(s): "PROBNP" in the last 8760 hours.  CBG: No results for input(s): "GLUCAP" in the last 168 hours.  Radiological Exams on Admission: No results found. Assessment/Plan Elizabeth Morton is a 68 y.o. 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 Hershell Lose.  Mechanical aortic valve-anticoagulated on Coumadin , with history of significant bleeding with Lovenox  bridge in the past.  As such, avoiding Lovenox  and will plan to anticoagulate on heparin  drip in preparation for surgery on 5/1. -Pharmacy consult placed for heparin  drip  Abdominal hernia-followed by general surgery Dr. Hershell Lose, plan for surgical repair 5/1 -Dr. Hershell Lose aware of patient's admission to the hospital tonight, would recommend consulting surgical team formally in the morning for any further recommendations  Gout-continue allopurinol  CAD/hypertension-continue home Lasix , Cozaar  GERD-Protonix  p.o.  Hyperlipidemia-Crestor  CKD stage III-renal function appears to be at baseline on most recent labs, will follow morning labs  DVT prophylaxis: Heparin  drip    Code Status: Full Code  Consults called: Dr. Hershell Lose aware, not formally consulted this evening.  Admission status: The appropriate patient status for this patient is INPATIENT. Inpatient status is judged to be reasonable and necessary in order to provide the required intensity of service to ensure the patient's safety. The patient's presenting symptoms, physical exam findings, and initial radiographic and laboratory data in the context of their chronic comorbidities is felt to place them at high risk for further clinical deterioration. Furthermore, it is not anticipated that the patient will be medically stable for discharge from the hospital within 2 midnights of admission.    I certify that at the point of admission it is my clinical judgment that the patient will require inpatient hospital care spanning beyond 2 midnights from the point of admission due to high intensity of service, high risk for further deterioration and high frequency of surveillance required  Time spent: 59 minutes  Elizabeth Person Rickey Charm MD [facility] Pager [phone]  If 7PM-7AM, please  contact night-coverage [email or web address] Password [facility]  11/08/2023, 4:52 PM

## 2023-11-08 NOTE — Progress Notes (Signed)
 PHARMACY - ANTICOAGULATION CONSULT NOTE  Pharmacy Consult for IV heparin  Indication: History of mech aortic valve holding Coumadin  for surgery.  No Known Allergies  Patient Measurements:    Vital Signs: Temp: 97.6 F (36.4 C) (04/28 1617) Temp Source: Oral (04/28 1617) BP: 148/64 (04/28 1617) Pulse Rate: 82 (04/28 1617)  Labs: Recent Labs    11/08/23 1347  INR 1.5*    CrCl cannot be calculated (Unknown ideal weight.).   Medical History: Past Medical History:  Diagnosis Date   Aortic stenosis    Aortic valve replacement 2006   Bacteremia 01/19/2019   CAD (coronary artery disease)    a. s/p CABG 2006 at time of AVR.   CVA (cerebral infarction)    related to thrombosed aortic root aneurysm   Dyslipidemia    Dyspnea    Gallstones    GERD (gastroesophageal reflux disease)    Gout    Heart murmur    History of kidney stones    Hx of CABG    2006,LIMA to LAD, SVG to diagonal   Hx of transfusion of packed red blood cells    Hypertension    Hypothyroidism    Iron deficiency anemia    Lower GI bleed 06/2015   Pericardial effusion    Insignificant small pericardial effusion seen on echo, November, 2013   Peripheral vascular disease (HCC) 2006   blood clot -stroke   Pneumonia    S/P aortic valve replacement    a. Bentall procedure,#21 St. Jude mechanical valve conduit with reimplantation of the coronaries 2006   Status post colonoscopy with polypectomy    "bleeding; colonoscopy was ~ 06/18/2015"   Stroke [facility]) 2006   no deficits    Warfarin anticoagulation    coumadin  therapy    Medications:  Prior to admission medications include warfarin which is on hold for surgery; last warfarin dose 4/25 per Pharmacist (Cardiology) notes. PTA warfarin regimen per Coumadin  Clinic: 3 mg daily except 1.5 mg on Sundays Reported to have a history of bleed on Lovenox .   Assessment: Pharmacy consulted to manage IV heparin  for this 68 yo female who was on warfarin prior to  admission (s/p aortic valve replacement) and warfarin is currently on hold for hernia repair surgery.   Today's INR 1.5   Goal of Therapy:  Heparin  level 0.3-0.7 units/ml Monitor platelets by anticoagulation protocol: Yes   Plan:  Initiate IV heparin  with 3000 unit IV bolus per infusion then 950 units/hr continuous infusion 6 hr heparin  level to titrate infusion Monitor daily heparin  level, CBC, signs/symptoms of bleeding   Thank you for allowing pharmacy to be a part of this patient's care.  Alfredo Inch, PharmD, BCPS Clinical Pharmacist [place] 11/08/2023 4:54 PM

## 2023-11-08 NOTE — Patient Instructions (Addendum)
 Description   Awaiting admission for upcoming hernia surgery/heparin  drip. No instructions given as patient will go to Hospital for upcoming 5/1 surgery to be admitted and will follow up once discharged.  Normal dose: Warfarin 1 tablet daily except 1/2 tablet on Sundays. Recheck INR in 1 week post hernia surgery/hospitalization. Coumadin  Clinic [phone]

## 2023-11-09 ENCOUNTER — Encounter (HOSPITAL_COMMUNITY): Payer: Self-pay | Admitting: Internal Medicine

## 2023-11-09 DIAGNOSIS — F5104 Psychophysiologic insomnia: Secondary | ICD-10-CM | POA: Insufficient documentation

## 2023-11-09 DIAGNOSIS — K432 Incisional hernia without obstruction or gangrene: Secondary | ICD-10-CM | POA: Diagnosis not present

## 2023-11-09 DIAGNOSIS — Z8673 Personal history of transient ischemic attack (TIA), and cerebral infarction without residual deficits: Secondary | ICD-10-CM

## 2023-11-09 DIAGNOSIS — E669 Obesity, unspecified: Secondary | ICD-10-CM | POA: Diagnosis present

## 2023-11-09 DIAGNOSIS — N200 Calculus of kidney: Secondary | ICD-10-CM | POA: Insufficient documentation

## 2023-11-09 DIAGNOSIS — I10 Essential (primary) hypertension: Secondary | ICD-10-CM | POA: Diagnosis not present

## 2023-11-09 DIAGNOSIS — K219 Gastro-esophageal reflux disease without esophagitis: Secondary | ICD-10-CM

## 2023-11-09 DIAGNOSIS — Z7901 Long term (current) use of anticoagulants: Secondary | ICD-10-CM | POA: Diagnosis not present

## 2023-11-09 DIAGNOSIS — I130 Hypertensive heart and chronic kidney disease with heart failure and stage 1 through stage 4 chronic kidney disease, or unspecified chronic kidney disease: Secondary | ICD-10-CM | POA: Insufficient documentation

## 2023-11-09 DIAGNOSIS — N1831 Chronic kidney disease, stage 3a: Secondary | ICD-10-CM | POA: Diagnosis not present

## 2023-11-09 DIAGNOSIS — M1A9XX0 Chronic gout, unspecified, without tophus (tophi): Secondary | ICD-10-CM

## 2023-11-09 DIAGNOSIS — M109 Gout, unspecified: Secondary | ICD-10-CM | POA: Diagnosis present

## 2023-11-09 DIAGNOSIS — I700 Atherosclerosis of aorta: Secondary | ICD-10-CM | POA: Insufficient documentation

## 2023-11-09 LAB — CBC
HCT: 38.9 % (ref 36.0–46.0)
Hemoglobin: 12.7 g/dL (ref 12.0–15.0)
MCH: 29.8 pg (ref 26.0–34.0)
MCHC: 32.6 g/dL (ref 30.0–36.0)
MCV: 91.3 fL (ref 80.0–100.0)
Platelets: 232 10*3/uL (ref 150–400)
RBC: 4.26 MIL/uL (ref 3.87–5.11)
RDW: 14.5 % (ref 11.5–15.5)
WBC: 6.9 10*3/uL (ref 4.0–10.5)
nRBC: 0 % (ref 0.0–0.2)

## 2023-11-09 LAB — HEPARIN LEVEL (UNFRACTIONATED)
Heparin Unfractionated: 0.79 [IU]/mL — ABNORMAL HIGH (ref 0.30–0.70)
Heparin Unfractionated: 0.66 [IU]/mL (ref 0.30–0.70)
Heparin Unfractionated: 0.5 [IU]/mL (ref 0.30–0.70)

## 2023-11-09 LAB — BASIC METABOLIC PANEL WITH GFR
Anion gap: 11 (ref 5–15)
BUN: 13 mg/dL (ref 8–23)
CO2: 22 mmol/L (ref 22–32)
Calcium: 9.2 mg/dL (ref 8.9–10.3)
Chloride: 104 mmol/L (ref 98–111)
Creatinine, Ser: 1.17 mg/dL — ABNORMAL HIGH (ref 0.44–1.00)
GFR, Estimated: 51 mL/min — ABNORMAL LOW (ref >60)
Glucose, Bld: 103 mg/dL — ABNORMAL HIGH (ref 70–99)
Potassium: 3.4 mmol/L — ABNORMAL LOW (ref 3.5–5.1)
Sodium: 137 mmol/L (ref 135–145)

## 2023-11-09 LAB — PROTIME-INR
INR: 1.4 — ABNORMAL HIGH (ref 0.8–1.2)
Prothrombin Time: 17.1 s — ABNORMAL HIGH (ref 11.4–15.2)

## 2023-11-09 LAB — HIV ANTIBODY (ROUTINE TESTING W REFLEX): HIV Screen 4th Generation wRfx: NONREACTIVE

## 2023-11-09 MED ORDER — CHLORHEXIDINE GLUCONATE CLOTH 2 % EX PADS
6.0000 | MEDICATED_PAD | Freq: Once | CUTANEOUS | Status: AC
Start: 2023-11-11 — End: 2023-11-11
  Administered 2023-11-11: 6 via TOPICAL

## 2023-11-09 MED ORDER — ACETAMINOPHEN 500 MG PO TABS
1000.0000 mg | ORAL_TABLET | ORAL | Status: AC
  Administered 2023-11-11: 1000 mg via ORAL
  Filled 2023-11-09: qty 2

## 2023-11-09 MED ORDER — POTASSIUM CHLORIDE CRYS ER 20 MEQ PO TBCR
20.0000 meq | EXTENDED_RELEASE_TABLET | Freq: Once | ORAL | Status: AC
  Administered 2023-11-09: 20 meq via ORAL
  Filled 2023-11-09: qty 1

## 2023-11-09 MED ORDER — ENSURE PRE-SURGERY PO LIQD
592.0000 mL | Freq: Once | ORAL | Status: AC
  Administered 2023-11-10: 592 mL via ORAL
  Filled 2023-11-09: qty 592

## 2023-11-09 MED ORDER — BUPIVACAINE LIPOSOME 1.3 % IJ SUSP
20.0000 mL | Freq: Once | INTRAMUSCULAR | Status: DC
  Filled 2023-11-09: qty 20

## 2023-11-09 MED ORDER — CHLORHEXIDINE GLUCONATE CLOTH 2 % EX PADS
6.0000 | MEDICATED_PAD | Freq: Once | CUTANEOUS | Status: AC
  Administered 2023-11-10: 6 via TOPICAL

## 2023-11-09 MED ORDER — GABAPENTIN 300 MG PO CAPS
300.0000 mg | ORAL_CAPSULE | ORAL | Status: AC
Start: 2023-11-11 — End: 2023-11-12
  Administered 2023-11-11: 300 mg via ORAL
  Filled 2023-11-09: qty 1

## 2023-11-09 MED ORDER — ENSURE PRE-SURGERY PO LIQD
296.0000 mL | Freq: Once | ORAL | Status: AC
  Administered 2023-11-11: 296 mL via ORAL
  Filled 2023-11-09: qty 296

## 2023-11-09 MED ORDER — CEFAZOLIN SODIUM-DEXTROSE 2-4 GM/100ML-% IV SOLN
2.0000 g | INTRAVENOUS | Status: AC
  Administered 2023-11-11: 2 g via INTRAVENOUS
  Filled 2023-11-09: qty 100

## 2023-11-09 NOTE — Progress Notes (Signed)
  Progress Note   Patient: Elizabeth Morton:[id number] DOB: 1955/07/31 DOA: 11/08/2023     1 DOS: the patient was seen and examined on 11/09/2023   Brief hospital course: Taken from H&P.  Elizabeth Morton is a 68 y.o. 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 Hershell Lose.  Patient had a prior history of bleeding and bruising while on Lovenox  bridge during surgery in the past and does not want to have Lovenox  this time.  Anticipated hernia repair with Dr. Hershell Lose on 11/11/2023.  4/29: Hemodynamically stable, labs with mild hypokalemia at 3.4 which is being repleted.  Assessment and Plan: * Recurrent ventral hernia Patient with history of recurrent ventral hernia, surgery is scheduled for 5/1.  No current pain or incarceration. - Supportive care  Long term current use of anticoagulant therapy With history of mechanical aortic valve, patient is being admitted for heparin  bridge as she does not want Lovenox  due to prior history of bleeding and bruising with it. She was on Coumadin  at home -Continue with heparin  infusion -Heparin  to stop 8 hours before surgery  Stage 3a chronic kidney disease (HCC) Renal function stable. - Monitor renal function -Avoid nephrotoxins  Hypertension - Continue home Cozaar and lasix   Gastro-esophageal reflux disease without esophagitis - Continue Protonix   Gout - Continue allopurinol  History of CVA (cerebrovascular accident) - Continue statin   Subjective: Patient was seen and examined today.  No new concern.  Physical Exam: Vitals:   11/08/23 1617 11/08/23 1918 11/08/23 2211 11/09/23 0422  BP: (!) 148/64 (!) 148/56 (!) 154/63 (!) 152/61  Pulse: 82 76 71 69  Resp:  18 18 18   Temp: 97.6 F (36.4 C) (!) 97.5 F (36.4 C) 97.7 F (36.5  C) 97.7 F (36.5 C)  TempSrc: Oral     SpO2: 99% 98% 97% 95%  Weight: 78.2 kg     Height: 5\' 2"  (1.575 m)      General.  Overweight lady, in no acute distress. Pulmonary.  Lungs clear bilaterally, normal respiratory effort. CV.  Regular rate and rhythm, no JVD, rub or murmur. Abdomen.  Soft, nontender, nondistended, BS positive.  Ventral hernia CNS.  Alert and oriented .  No focal neurologic deficit. Extremities.  No edema, no cyanosis, pulses intact and symmetrical. Psychiatry.  Judgment and insight appears normal.   Data Reviewed: Prior data reviewed  Family Communication: Discussed with patient  Disposition: Status is: Inpatient Remains inpatient appropriate because: Severity of illness  Planned Discharge Destination: Home  DVT prophylaxis.  Heparin  GTT Time spent: 40 minutes  This record has been created using Conservation officer, historic buildings. Errors have been sought and corrected,but may not always be located. Such creation errors do not reflect on the standard of care.   Author: Luna Salinas, MD 11/09/2023 1:44 PM  For on call review [email or web address].

## 2023-11-09 NOTE — Assessment & Plan Note (Signed)
-

## 2023-11-09 NOTE — Assessment & Plan Note (Signed)
Renal function stable. -Monitor renal function -Avoid nephrotoxins

## 2023-11-09 NOTE — Progress Notes (Addendum)
 PHARMACY - ANTICOAGULATION CONSULT NOTE  Pharmacy Consult for IV heparin  Indication: History of mech aortic valve holding Coumadin  for surgery.  No Known Allergies  Patient Measurements: Height: 5\' 2"  (157.5 cm) Weight: 78.2 kg (172 lb 6.4 oz) IBW/kg (Calculated) : 50.1 HEPARIN  DW (KG): 67.3  Vital Signs: Temp: 97.7 F (36.5 C) (04/28 2211) Temp Source: Oral (04/28 1617) BP: 154/63 (04/28 2211) Pulse Rate: 71 (04/28 2211)  Labs: Recent Labs    11/08/23 1347 11/09/23 0114  HGB  --  12.7  HCT  --  38.9  PLT  --  232  LABPROT  --  17.1*  INR 1.5* 1.4*  HEPARINUNFRC  --  0.79*  CREATININE  --  1.17*    Estimated Creatinine Clearance: 45.8 mL/min (A) (by C-G formula based on SCr of 1.17 mg/dL (H)).   Medical History: Past Medical History:  Diagnosis Date   Aortic stenosis    Aortic valve replacement 2006   Bacteremia 01/19/2019   CAD (coronary artery disease)    a. s/p CABG 2006 at time of AVR.   CVA (cerebral infarction)    related to thrombosed aortic root aneurysm   Dyslipidemia    Dyspnea    Gallstones    GERD (gastroesophageal reflux disease)    Gout    Heart murmur    History of kidney stones    Hx of CABG    2006,LIMA to LAD, SVG to diagonal   Hx of transfusion of packed red blood cells    Hypertension    Hypothyroidism    Iron deficiency anemia    Lower GI bleed 06/2015   Pericardial effusion    Insignificant small pericardial effusion seen on echo, November, 2013   Peripheral vascular disease (HCC) 2006   blood clot -stroke   Pneumonia    S/P aortic valve replacement    a. Bentall procedure,#21 St. Jude mechanical valve conduit with reimplantation of the coronaries 2006   Status post colonoscopy with polypectomy    "bleeding; colonoscopy was ~ 06/18/2015"   Stroke [facility]) 2006   no deficits    Warfarin anticoagulation    coumadin  therapy    Medications:  Prior to admission medications include warfarin which is on hold for surgery; last  warfarin dose 4/25 per Pharmacist (Cardiology) notes. PTA warfarin regimen per Coumadin  Clinic: 3 mg daily except 1.5 mg on Sundays Reported to have a history of bleed on Lovenox .   Assessment: Pharmacy consulted to manage IV heparin  for this 68 yo female who was on warfarin prior to admission (s/p aortic valve replacement) and warfarin is currently on hold for hernia repair surgery.   Today's INR 1.5  11/09/2023 HL 0.79 slightly supra-therapeutic on 950 units/hr INR 1.4 CBC WNL No bleeding noted per RN   Goal of Therapy:  Heparin  level 0.3-0.7 units/ml Monitor platelets by anticoagulation protocol: Yes   Plan:  Decrease heparin  drip to 850 units/hr Heparin  level in 6 hours Monitor daily heparin  level, CBC, signs/symptoms of bleeding   Thank you for allowing pharmacy to be a part of this patient's care.  Beau Bound RPh 11/09/2023, 2:24 AM

## 2023-11-09 NOTE — Plan of Care (Signed)
  Problem: Clinical Measurements: Goal: Ability to maintain clinical measurements within normal limits will improve Outcome: Progressing Goal: Will remain free from infection Outcome: Progressing   Problem: Coping: Goal: Level of anxiety will decrease Outcome: Progressing   Problem: Pain Managment: Goal: General experience of comfort will improve and/or be controlled Outcome: Progressing

## 2023-11-09 NOTE — Plan of Care (Signed)
?  Problem: Education: ?Goal: Knowledge of General Education information will improve ?Description: Including pain rating scale, medication(s)/side effects and non-pharmacologic comfort measures ?Outcome: Progressing ?  ?Problem: Clinical Measurements: ?Goal: Ability to maintain clinical measurements within normal limits will improve ?Outcome: Progressing ?  ?Problem: Nutrition: ?Goal: Adequate nutrition will be maintained ?Outcome: Progressing ?  ?Problem: Elimination: ?Goal: Will not experience complications related to bowel motility ?Outcome: Progressing ?  ?

## 2023-11-09 NOTE — Progress Notes (Deleted)
 Aware of admission per pharmacy cardiology for preoperative heparin  bridging given problems with Lovenox  bridging in the past and need for bridging per cardiology.  Planning laparoscopic ventral hernia 5/1 Thursday morning.  Hold heparin  8 hours preop  Will see tomorrow  Elizabeth Goodie, MD, FACS, MASCRS Esophageal, Gastrointestinal & Colorectal Surgery Robotic and Minimally Invasive Surgery  [facility] [address] [place], [place] [address] [phone] Fax [phone] Main  CONTACT INFORMATION: Weekday (9AM-5PM): Call [facility] main office at [phone] Weeknight (5PM-9AM) or Weekend/Holiday: Check EPIC "Web Links" tab & use "AMION" (password " [email or web address]") for General Surgery [facility] coverage  Please, DO NOT use SecureChat  (it is not reliable communication to reach operating surgeons & will lead to a delay in care).   Epic staff messaging available for outptient concerns needing 1-2 business day response.

## 2023-11-09 NOTE — Assessment & Plan Note (Signed)
 Continue statin.

## 2023-11-09 NOTE — Assessment & Plan Note (Signed)
 With history of mechanical aortic valve, patient is being admitted for heparin  bridge as she does not want Lovenox  due to prior history of bleeding and bruising with it. She was on Coumadin  at home -Continue with heparin  infusion -Heparin  to stop 8 hours before surgery

## 2023-11-09 NOTE — Progress Notes (Signed)
 PHARMACY - ANTICOAGULATION CONSULT NOTE  Pharmacy Consult for IV heparin  Indication: History of mech aortic valve holding Coumadin  for surgery.  No Known Allergies  Patient Measurements: Height: 5\' 2"  (157.5 cm) Weight: 78.2 kg (172 lb 6.4 oz) IBW/kg (Calculated) : 50.1 HEPARIN  DW (KG): 67.3  Vital Signs: Temp: 97.7 F (36.5 C) (04/29 0422) BP: 152/61 (04/29 0422) Pulse Rate: 69 (04/29 0422)  Labs: Recent Labs    11/08/23 1347 11/09/23 0114 11/09/23 0923  HGB  --  12.7  --   HCT  --  38.9  --   PLT  --  232  --   LABPROT  --  17.1*  --   INR 1.5* 1.4*  --   HEPARINUNFRC  --  0.79* 0.66  CREATININE  --  1.17*  --     Estimated Creatinine Clearance: 45.8 mL/min (A) (by C-G formula based on SCr of 1.17 mg/dL (H)).   Medical History: Past Medical History:  Diagnosis Date   Aortic stenosis    Aortic valve replacement 2006   Bacteremia 01/19/2019   CAD (coronary artery disease)    a. s/p CABG 2006 at time of AVR.   CVA (cerebral infarction)    related to thrombosed aortic root aneurysm   Dyslipidemia    Dyspnea    Gallstones    GERD (gastroesophageal reflux disease)    Gout    Heart murmur    History of kidney stones    Hx of CABG    2006,LIMA to LAD, SVG to diagonal   Hx of transfusion of packed red blood cells    Hypertension    Hypothyroidism    Iron deficiency anemia    Lower GI bleed 06/2015   Pericardial effusion    Insignificant small pericardial effusion seen on echo, November, 2013   Peripheral vascular disease (HCC) 2006   blood clot -stroke   Pneumonia    S/P aortic valve replacement    a. Bentall procedure,#21 St. Jude mechanical valve conduit with reimplantation of the coronaries 2006   Status post colonoscopy with polypectomy    "bleeding; colonoscopy was ~ 06/18/2015"   Stroke [facility]) 2006   no deficits    Warfarin anticoagulation    coumadin  therapy    Medications:  Prior to admission medications include warfarin which is on hold for  surgery; last warfarin dose 4/25 per Pharmacist (Cardiology) notes. PTA warfarin regimen per Coumadin  Clinic: 3 mg daily except 1.5 mg on Sundays Reported to have a history of bleed on Lovenox .   Assessment: Pharmacy consulted to manage IV heparin  for this 68 yo female who was on warfarin prior to admission (s/p aortic valve replacement) and warfarin is currently on hold for hernia repair surgery (planned 5/1 @1145 ).   11/09/2023 HL 0.66--therapeutic on heparin  @850  units/hr INR 1.4 CBC WNL No s/sx of bleeding reported  Goal of Therapy:  Heparin  level 0.3-0.7 units/ml Monitor platelets by anticoagulation protocol: Yes   Plan:  Continue heparin  drip at 850 units/hr Check confirmatory heparin  level in 6 hours Monitor daily heparin  level, CBC, signs/symptoms of bleeding HOLD heparin  starting 5/1 @ midnight for upcoming laparoscopic ventral hernia procedure   Thank you for allowing pharmacy to be a part of this patient's care.  Roselee Cong, PharmD Clinical Pharmacist  4/29/202511:26 AM

## 2023-11-09 NOTE — Assessment & Plan Note (Signed)
 Continue allopurinol

## 2023-11-09 NOTE — Assessment & Plan Note (Addendum)
-   Continue home Cozaar and lasix

## 2023-11-09 NOTE — Consult Note (Signed)
 11/09/2023     REFERRING PHYSICIAN: Manya Sells, Fnp  Patient Care Team: Marlo Simpler as PCP - Dane Dung, Annye Kinds, MD as Consulting Provider (General Surgery) Ola Berger [place] , MD (Cardiovascular Disease)  PROVIDER: Girtha Lama, MD  [facility] MRN: [id number] DOB: [birth year]  SUBJECTIVE   Chief Complaint: New Consultation ( Ventral hernia)   Elizabeth Morton is a 68 y.o. female  who is seen today as an office consultation  at the request of Dr.. Manya Sells  for evaluation of recurrent hernia.  History of Present Illness:  68 year old obese woman with numerous health issues. Coronary disease status post CABG. mechanical heart valve. Chronically anticoagulated on warfarin. Hepatitis C. History of stroke. Gout. Had cholecystitis. I did a laparoscopic cholecystectomy in 2014. Patient developed hernia at the periumbilical incision. Dr. Gaylyn Keas did a laparoscopic repair with an 8 cm circular Ventralex mesh in September 2017. She did have some oozing and bruising from her Lovenox  bridge. He followed her for the next 6 months until the bruise and hematoma completely resolved and as needed.   Over the past year patient felt some swelling that is now out all the time. Not particularly painful but getting humming more unsightly. Discussed with primary care physician Recent CAT scan noted fat-containing hernia. Consultation requested for recurrence.   Patient comes a by herself. She noticed that something was popping out about a year ago but now it is gotten larger and it is stuck. Not particularly painful but somewhat sensitive. No nausea or vomiting. No fevers or chills or falls. She can walk about 20 minutes no exertional chest pain. She has gained some weight. She no longer smokes. No diabetes. Moves her bowels about every 2 days. Has a known small hiatal hernia but no dysphagia to solids liquids no heartburn or reflux  Discussed with cardiology.  Problems with Lovenox   bridging so recommended preadmission for heparin  drip.  Cardiology wished hospitalist to admit  Medical History:  Past Medical History:  Diagnosis Date  Heart valve disease  History of stroke   Patient Active Problem List  Diagnosis  CAD (coronary artery disease)  Chronic congestive heart failure with left ventricular diastolic dysfunction (CMS/HHS-HCC)  History of CVA (cerebrovascular accident)  Hx of CABG  S/P aortic valve replacement  Warfarin anticoagulation  Recurrent incisional hernia with incarceration  Hiatal hernia   Past Surgical History:  Procedure Laterality Date  CARDIAC VALVE REPLACEMENT    No Known Allergies  Current Outpatient Medications on File Prior to Visit  Medication Sig Dispense Refill  allopurinoL (ZYLOPRIM) 100 MG tablet Take 100 mg by mouth once daily  calcium  carbonate-vitamin D3 (CALTRATE 600+D) 600 mg-10 mcg (400 unit) tablet Take 1 tablet by mouth 2 (two) times daily  colchicine  (COLCRYS ) 0.6 mg tablet Take 0.6 mg by mouth  FUROsemide  (LASIX ) 20 MG tablet Take 20 mg by mouth once daily  levothyroxine  (SYNTHROID ) 75 MCG tablet 1 TABLET IN THE MORNING ON AN EMPTY STOMACH ORALLY ONCE A DAY EXCEPT NONE ON SUNDAYS  losartan (COZAAR) 25 MG tablet Take 25 mg by mouth once daily  pantoprazole  (PROTONIX ) 40 MG DR tablet Take 40 mg by mouth once daily  rosuvastatin (CRESTOR) 20 MG tablet Take 20 mg by mouth once daily  warfarin (COUMADIN ) 3 MG tablet TAKE 1 TABLET DAILY EXCEPT 1/2 TABLET ON SUNDAYS OR AS DIRECTED BY ANTICOAGULATION CLINIC   No current facility-administered medications on file prior to visit.   Family History  Problem Relation Age of Onset  Skin cancer Mother  Coronary Artery Disease (Blocked arteries around heart) Mother  Breast cancer Mother  Stroke Brother    Social History   Tobacco Use  Smoking Status Former  Types: Cigarettes  Smokeless Tobacco Never    Social History   Socioeconomic History  Marital status: Married   Tobacco Use  Smoking status: Former  Types: Cigarettes  Smokeless tobacco: Never  Vaping Use  Vaping status: Never Used  Substance and Sexual Activity  Alcohol use: Not Currently  Drug use: Never   ############################################################  Review of Systems: A complete review of systems (ROS) was obtained from the patient.  We have reviewed this information and discussed as appropriate with the patient.  See HPI as well for other pertinent ROS.  Constitutional: No fevers, chills, sweats. Weight stable Eyes: No vision changes, No discharge HENT: No sore throats, nasal drainage Lymph: No neck swelling, No bruising easily Pulmonary: No cough, productive sputum CV: No orthopnea, PND . No exertional chest/neck/shoulder/arm pain. Patient can walk 20 minutes gradually.   GI: No personal nor family history of GI/colon cancer, inflammatory bowel disease, irritable bowel syndrome, allergy such as Celiac Sprue, dietary/dairy problems, colitis, ulcers nor gastritis. No recent sick contacts/gastroenteritis. No travel outside the country. No changes in diet.  Renal: No UTIs, No hematuria Genital: No drainage, bleeding, masses Musculoskeletal: No severe joint pain. Good ROM major joints Skin: No sores or lesions Heme/Lymph: No easy bleeding. No swollen lymph nodes Neuro: No active seizures. No facial droop Psych: No hallucinations. No agitation  OBJECTIVE   Vitals:  06/07/23 1346  BP: (!) 162/75  Pulse: 85  Temp: 36.7 C (98.1 F)  SpO2: 96%  Weight: 76.7 kg (169 lb 3.2 oz)  Height: 157.5 cm (5\' 2" )  PainSc: 0-No pain   Body mass index is 30.95 kg/m.  BP (!) 152/61 (BP Location: Left Arm)   Pulse 69   Temp 97.7 F (36.5 C)   Resp 18   Ht 5\' 2"  (1.575 m)   Wt 78.2 kg   SpO2 95%   BMI 31.53 kg/m    PHYSICAL EXAM:  Constitutional: Not cachectic. Hygeine adequate. Vitals signs as above.  Eyes: No glasses. Vision adequate,Pupils reactive, normal  extraocular movements. Sclera nonicteric Neuro: CN II-XII intact. No major focal sensory defects. No major motor deficits. Lymph: No head/neck/groin lymphadenopathy Psych: No severe agitation. No severe anxiety. Judgment & insight Adequate, Oriented x4, HENT: Normocephalic, Mucus membranes moist. No thrush. Hearing: adequate Neck: Supple, No tracheal deviation. No obvious thyromegaly Chest: No pain to chest wall compression. Good respiratory excursion. No audible wheezing CV: Pulses intact. irregular. No major extremity edema. Obvious click radiating to both carotids consistent with her mechanical valve. Ext: No obvious deformity or contracture. Edema: Mild 1+ edema in bilateral lower extremities.. No cyanosis Skin: No major subcutaneous nodules. Warm and dry Musculoskeletal: Severe joint rigidity not present. No obvious clubbing. No digital petechiae. Mobility: no assist device moving easily without restrictions  Abdomen: Obese Soft. Nondistended. Nontender. Hernia: 6 x 6 cm left supraumbilical mass consistent with incarcerated hernia.. Diastasis recti: Mild supraumbilical midline. No hepatomegaly. No splenomegaly.  Genital/Pelvic: Inguinal hernia: Not present. Inguinal lymph nodes: without lymphadenopathy nor hidradenitis.   Rectal: (Deferred)    ###################################################################  Labs, Imaging and Diagnostic Testing:  Located in 'Care Everywhere' section of Epic EMR chart  PRIOR [facility] CLINIC NOTES:  Located in 'Care Everywhere' section of Epic EMR chart  SURGERY NOTES:  Located in 'Care Everywhere' section of Epic EMR chart  PATHOLOGY:  Not applicable  Assessment and Plan:  DIAGNOSES:  Diagnoses and all orders for this visit:  Recurrent incisional hernia with incarceration  S/P aortic valve replacement  Warfarin anticoagulation  Chronic congestive heart failure with left ventricular diastolic dysfunction (CMS/HHS-HCC)  Hiatal  hernia    ASSESSMENT/PLAN  Pleasant obese woman with central abdominal hernia recurrence most likely left superior corner of 8 cm old mesh from 7 years ago. Incarcerated primarily with omentum and colon nearby.  Standard of care is to consider rerepair with a larger sheet of mesh. Laparoscopic underlay. Most likely 25 x 20 given obesity and recurrence.  The anatomy & physiology of the abdominal wall was discussed. The pathophysiology of hernias was discussed. Natural history risks without surgery including progeressive enlargement, pain, incarceration, & strangulation was discussed. Contributors to complications such as smoking, obesity, diabetes, prior surgery, etc were discussed.   I feel the risks of no intervention will lead to serious problems that outweigh the operative risks; therefore, I recommended surgery to reduce and repair the hernia. I explained laparoscopic techniques with possible need for an open approach. I noted the probable use of mesh to patch and/or buttress the hernia repair  Risks such as bleeding, infection, abscess, need for further treatment, injury to other organs, need for repair of tissues / organs, stroke, heart attack, death, and other risks were discussed. I noted a good likelihood this will help address the problem. Goals of post-operative recovery were discussed as well. Possibility that this will not correct all symptoms was explained. I stressed the importance of low-impact activity, aggressive pain control, avoiding constipation, & not pushing through pain to minimize risk of post-operative chronic pain or injury. Possibility of reherniation especially with smoking, obesity, diabetes, immunosuppression, and other health conditions was discussed. We will work to minimize complications.   An educational handout further explaining the pathology & treatment options was given as well. Questions were answered. The patient expresses understanding & wishes to proceed  with surgery.  Other issues she has a mechanical heart valve is anticoagulated on warfarin. She recalls having issues a lot of bleeding and bruising doing the Lovenox  bridge shots and really does not want to do that again. I noted while bruising can be frustrating, the risk of stroke and heart attack or the other side of that. Aware of admission per pharmacy cardiology for preoperative heparin  bridging given problems with Lovenox  bridging in the past and need for bridging per cardiology.   Planning laparoscopic ventral hernia 5/1 Thursday morning.   Hold heparin  8 hours preop      Eddye Goodie, MD, FACS, MASCRS Esophageal, Gastrointestinal & Colorectal Surgery Robotic and Minimally Invasive Surgery  [facility] [address], [address] [place], [place] [address] [phone] Fax [phone] Main  CONTACT INFORMATION: Weekday (9AM-5PM): Call [facility] main office at [phone] Weeknight (5PM-9AM) or Weekend/Holiday: Check EPIC "Web Links" tab & use "AMION" (password " [email or web address]") for General Surgery [facility] coverage  Please, DO NOT use SecureChat  (it is not reliable communication to reach operating surgeons & will lead to a delay in care).   Epic staff messaging available for outptient concerns needing 1-2 business day response.

## 2023-11-09 NOTE — Plan of Care (Signed)

## 2023-11-09 NOTE — Progress Notes (Signed)
 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  - confirmatory heparin  level collected at 3:30p is therapeutic at 0.50 - no bleeding documented   Goal of Therapy:  Heparin  level 0.3-0.7 units/ml Monitor platelets by anticoagulation protocol: Yes  Plan: - continue heparin  drip at 850 units/hr - daily heparin  level - monitor for s/sx bleeding - current plan is to stop heparin  drip 8 hrs prior to her surgery on 11/11/23  Sharlyn Deaner, PharmD, BCPS 11/09/2023 3:59 PM

## 2023-11-09 NOTE — Progress Notes (Signed)
   11/09/23 1107  TOC Brief Assessment  Insurance and Status Reviewed  Patient has primary care physician Yes  Home environment has been reviewed single family home  Prior level of function: independent  Prior/Current Home Services No current home services  Social Drivers of Health Review SDOH reviewed no interventions necessary  Readmission risk has been reviewed Yes  Transition of care needs no transition of care needs at this time    [place], LCSW 11/09/2023 11:08 AM

## 2023-11-09 NOTE — Assessment & Plan Note (Signed)
 Patient with history of recurrent ventral hernia, surgery is scheduled for 5/1.  No current pain or incarceration. - Supportive care

## 2023-11-09 NOTE — Hospital Course (Addendum)
 68 y.o. F with hx CVA due to aortic aneurysm, on warfarin, hx mech aortic valve after stroke, CAD s/p CABG, and hypothyroidism who was admitted for heparin  bridge prior to elective hernia repair.    S/p laparoscopic hernia repair on 5/1, post op course complicated by hypoxia.

## 2023-11-10 DIAGNOSIS — K432 Incisional hernia without obstruction or gangrene: Secondary | ICD-10-CM | POA: Diagnosis not present

## 2023-11-10 LAB — HEPARIN LEVEL (UNFRACTIONATED): Heparin Unfractionated: 0.51 [IU]/mL (ref 0.30–0.70)

## 2023-11-10 LAB — BASIC METABOLIC PANEL WITH GFR
Anion gap: 8 (ref 5–15)
BUN: 19 mg/dL (ref 8–23)
CO2: 23 mmol/L (ref 22–32)
Calcium: 9.2 mg/dL (ref 8.9–10.3)
Chloride: 104 mmol/L (ref 98–111)
Creatinine, Ser: 1.29 mg/dL — ABNORMAL HIGH (ref 0.44–1.00)
GFR, Estimated: 46 mL/min — ABNORMAL LOW (ref >60)
Glucose, Bld: 101 mg/dL — ABNORMAL HIGH (ref 70–99)
Potassium: 3.6 mmol/L (ref 3.5–5.1)
Sodium: 135 mmol/L (ref 135–145)

## 2023-11-10 LAB — CBC
HCT: 39.7 % (ref 36.0–46.0)
Hemoglobin: 12.8 g/dL (ref 12.0–15.0)
MCH: 29.4 pg (ref 26.0–34.0)
MCHC: 32.2 g/dL (ref 30.0–36.0)
MCV: 91.3 fL (ref 80.0–100.0)
Platelets: 236 10*3/uL (ref 150–400)
RBC: 4.35 MIL/uL (ref 3.87–5.11)
RDW: 14.5 % (ref 11.5–15.5)
WBC: 5.5 10*3/uL (ref 4.0–10.5)
nRBC: 0 % (ref 0.0–0.2)

## 2023-11-10 LAB — PROTIME-INR
INR: 1.1 (ref 0.8–1.2)
Prothrombin Time: 14.7 s (ref 11.4–15.2)

## 2023-11-10 MED ORDER — FUROSEMIDE 20 MG PO TABS: 20.0000 mg | ORAL_TABLET | Freq: Every day | ORAL | Status: DC

## 2023-11-10 MED ORDER — LOSARTAN POTASSIUM 50 MG PO TABS
50.0000 mg | ORAL_TABLET | Freq: Every day | ORAL | Status: DC
  Administered 2023-11-12 – 2023-11-14 (×3): 50 mg via ORAL
  Filled 2023-11-10 (×3): qty 1

## 2023-11-10 MED ORDER — LEVOTHYROXINE SODIUM 75 MCG PO TABS
75.0000 ug | ORAL_TABLET | Freq: Every day | ORAL | Status: DC
  Administered 2023-11-11 – 2023-11-16 (×6): 75 ug via ORAL
  Filled 2023-11-10 (×6): qty 1

## 2023-11-10 NOTE — Progress Notes (Signed)
  Progress Note   Patient: Elizabeth Morton ZOX:[id number] DOB: January 16, 1956 DOA: 11/08/2023     2 DOS: the patient was seen and examined on 11/10/2023 at 1:08PM      Brief hospital course: 68 y.o. F with hx CVA due to aortic aneurysm, on warfarin, hx mech aortic valve after stroke, CAD s/p CABG, and hypothyroidism who was admitted for heparin  bridge prior to elective hernia repair.       Assessment and Plan: * Hernia To the OR tomorrow - Post-op care per surgery  Cerebrovascular disease Had thrombosis of her aortic root aneurysm many years ago, developed an embolic stroke.  Has been on warfarin since then.  Aortic root subsequently repaired, has mechanical aortic valve and CABG. - Hold warfarin - Continue heparin  - Hold heparin  prior to surgery as discussed by general surgery - Resume heparin  after surgery as planned by general surgery  -Continue Crestor  Stage 3a chronic kidney disease (HCC) Stable  Hypertension Blood pressure controlled - Hold Lasix  and Cozaar tomorrow morning  Gastro-esophageal reflux disease without esophagitis -Continue Protonix   Gout -Continue allopurinol  Hypothyroidism - Resume levothyroxine             Subjective: Patient is doing okay, no fever, no chest pain, no confusion.     Physical Exam: BP (!) 151/65 (BP Location: Left Arm)   Pulse 88   Temp 97.9 F (36.6 C)   Resp 18   Ht 5\' 2"  (1.575 m)   Wt 78.2 kg   SpO2 100%   BMI 31.53 kg/m   Elderly adult female, sitting up in bed, interactive and appropriate RRR, no murmurs, no peripheral edema Respiratory normal, lungs clear without rales or wheezes Abdomen soft no tenderness palpation or guarding, no ascites or distention   Data Reviewed: Basic metabolic panel shows creatinine 1.29, no change CBC normal  Family Communication: None at the bedside    Disposition: Status is: Inpatient         Author: Ephriam Hashimoto, MD 11/10/2023 6:17 PM  For on  call review [email or web address].

## 2023-11-10 NOTE — Progress Notes (Signed)
 PHARMACY - ANTICOAGULATION CONSULT NOTE  Pharmacy Consult for IV heparin  Indication: History of mech aortic valve holding Coumadin  for surgery.  No Known Allergies  Patient Measurements: Height: 5\' 2"  (157.5 cm) Weight: 78.2 kg (172 lb 6.4 oz) IBW/kg (Calculated) : 50.1 HEPARIN  DW (KG): 67.3  Vital Signs: Temp: 97.9 F (36.6 C) (04/30 0448) BP: 118/57 (04/30 0448) Pulse Rate: 87 (04/30 0448)  Labs: Recent Labs    11/08/23 1347 11/09/23 0114 11/09/23 0114 11/09/23 0923 11/09/23 1455 11/10/23 0547  HGB  --  12.7  --   --   --  12.8  HCT  --  38.9  --   --   --  39.7  PLT  --  232  --   --   --  236  LABPROT  --  17.1*  --   --   --  14.7  INR 1.5* 1.4*  --   --   --  1.1  HEPARINUNFRC  --  0.79*   < > 0.66 0.50 0.51  CREATININE  --  1.17*  --   --   --  1.29*   < > = values in this interval not displayed.    Estimated Creatinine Clearance: 41.5 mL/min (A) (by C-G formula based on SCr of 1.29 mg/dL (H)).   Medical History: Past Medical History:  Diagnosis Date   Aortic stenosis    Aortic valve replacement 2006   Bacteremia 01/19/2019   CAD (coronary artery disease)    a. s/p CABG 2006 at time of AVR.   CVA (cerebral infarction)    related to thrombosed aortic root aneurysm   Dyslipidemia    Dyspnea    Gallstones    GERD (gastroesophageal reflux disease)    Gout    Heart murmur    History of kidney stones    Hx of CABG    2006,LIMA to LAD, SVG to diagonal   Hx of transfusion of packed red blood cells    Hypertension    Hypothyroidism    Iron deficiency anemia    Lower GI bleed 06/2015   Pericardial effusion    Insignificant small pericardial effusion seen on echo, November, 2013   Peripheral vascular disease (HCC) 2006   blood clot -stroke   Pneumonia    S/P aortic valve replacement    a. Bentall procedure,#21 St. Jude mechanical valve conduit with reimplantation of the coronaries 2006   Status post colonoscopy with polypectomy    "bleeding;  colonoscopy was ~ 06/18/2015"   Stroke [facility]) 2006   no deficits    Warfarin anticoagulation    coumadin  therapy    Medications:  Prior to admission medications include warfarin which is on hold for surgery; last warfarin dose 4/25 per Pharmacist (Cardiology) notes. PTA warfarin regimen per Coumadin  Clinic: 3 mg daily except 1.5 mg on Sundays Reported to have a history of bleed on Lovenox .   Assessment: Pharmacy consulted to manage IV heparin  for this 68 yo female who was on warfarin prior to admission (s/p aortic valve replacement) and warfarin is currently on hold for hernia repair surgery (planned 5/1 @1145 ).   11/10/2023 HL 0.51--therapeutic on heparin  @850  units/hr INR down to 1.1 Hgb 12.8, plts 236--stable No s/sx of bleeding reported  Goal of Therapy:  Heparin  level 0.3-0.7 units/ml Monitor platelets by anticoagulation protocol: Yes   Plan:  Continue heparin  drip at 850 units/hr Monitor daily heparin  level, CBC, signs/symptoms of bleeding HOLD heparin  starting 5/1 @ midnight for upcoming laparoscopic ventral  hernia procedure   Thank you for allowing pharmacy to be a part of this patient's care.  Roselee Cong, PharmD Clinical Pharmacist  4/30/20258:59 AM

## 2023-11-10 NOTE — H&P (View-Only) (Signed)
 Patient sitting up in chair smiling.  Still with some pain in her hernia but nothing too severe.  Plan laparoscopic recurrent incisional hernia repair with mesh.  Clear liquids after midnight.  N.p.o. after 4 AM.  Hold heparin  6-8 hours preop.  The anatomy & physiology of the abdominal wall was discussed.  The pathophysiology of hernias was discussed.  Natural history risks without surgery including progeressive enlargement, pain, incarceration, & strangulation was discussed.   Contributors to complications such as smoking, obesity, diabetes, prior surgery, etc were discussed.   I feel the risks of no intervention will lead to serious problems that outweigh the operative risks; therefore, I recommended surgery to reduce and repair the hernia.  I explained laparoscopic techniques with possible need for an open approach.  I noted the probable use of mesh to patch and/or buttress the hernia repair  Risks such as bleeding, infection, abscess, need for further treatment, injury to other organs, need for repair of tissues / organs, stroke, heart attack, death, and other risks were discussed.  I noted a good likelihood this will help address the problem.   Goals of post-operative recovery were discussed as well.  Possibility that this will not correct all symptoms was explained.  I stressed the importance of low-impact activity, aggressive pain control, avoiding constipation, & not pushing through pain to minimize risk of post-operative chronic pain or injury. Possibility of reherniation especially with smoking, obesity, diabetes, immunosuppression, and other health conditions was discussed.  We will work to minimize complications.     An educational handout further explaining the pathology & treatment options was given as well.  Questions were answered.  The patient expresses understanding & wishes to proceed with surgery.  Eddye Goodie, MD, FACS, MASCRS Esophageal, Gastrointestinal & Colorectal  Surgery Robotic and Minimally Invasive Surgery  [facility] [address] [place], [place] [address] [phone] Fax [phone] Main  CONTACT INFORMATION: Weekday (9AM-5PM): Call [facility] main office at [phone] Weeknight (5PM-9AM) or Weekend/Holiday: Check EPIC "Web Links" tab & use "AMION" (password " [email or web address]") for General Surgery [facility] coverage  Please, DO NOT use SecureChat  (it is not reliable communication to reach operating surgeons & will lead to a delay in care).   Epic staff messaging available for outptient concerns needing 1-2 business day response.

## 2023-11-10 NOTE — Progress Notes (Signed)
 Patient sitting up in chair smiling.  Still with some pain in her hernia but nothing too severe.  Plan laparoscopic recurrent incisional hernia repair with mesh.  Clear liquids after midnight.  N.p.o. after 4 AM.  Hold heparin  6-8 hours preop.  The anatomy & physiology of the abdominal wall was discussed.  The pathophysiology of hernias was discussed.  Natural history risks without surgery including progeressive enlargement, pain, incarceration, & strangulation was discussed.   Contributors to complications such as smoking, obesity, diabetes, prior surgery, etc were discussed.   I feel the risks of no intervention will lead to serious problems that outweigh the operative risks; therefore, I recommended surgery to reduce and repair the hernia.  I explained laparoscopic techniques with possible need for an open approach.  I noted the probable use of mesh to patch and/or buttress the hernia repair  Risks such as bleeding, infection, abscess, need for further treatment, injury to other organs, need for repair of tissues / organs, stroke, heart attack, death, and other risks were discussed.  I noted a good likelihood this will help address the problem.   Goals of post-operative recovery were discussed as well.  Possibility that this will not correct all symptoms was explained.  I stressed the importance of low-impact activity, aggressive pain control, avoiding constipation, & not pushing through pain to minimize risk of post-operative chronic pain or injury. Possibility of reherniation especially with smoking, obesity, diabetes, immunosuppression, and other health conditions was discussed.  We will work to minimize complications.     An educational handout further explaining the pathology & treatment options was given as well.  Questions were answered.  The patient expresses understanding & wishes to proceed with surgery.  Eddye Goodie, MD, FACS, MASCRS Esophageal, Gastrointestinal & Colorectal  Surgery Robotic and Minimally Invasive Surgery  [facility] [address] [place], [place] [address] [phone] Fax [phone] Main  CONTACT INFORMATION: Weekday (9AM-5PM): Call [facility] main office at [phone] Weeknight (5PM-9AM) or Weekend/Holiday: Check EPIC "Web Links" tab & use "AMION" (password " [email or web address]") for General Surgery [facility] coverage  Please, DO NOT use SecureChat  (it is not reliable communication to reach operating surgeons & will lead to a delay in care).   Epic staff messaging available for outptient concerns needing 1-2 business day response.

## 2023-11-11 ENCOUNTER — Inpatient Hospital Stay (HOSPITAL_COMMUNITY): Admitting: Anesthesiology

## 2023-11-11 ENCOUNTER — Inpatient Hospital Stay (HOSPITAL_COMMUNITY)

## 2023-11-11 ENCOUNTER — Encounter (HOSPITAL_COMMUNITY)
Admission: AD | Disposition: A | Discharge status: Home / Self Care | Payer: Self-pay | Source: Home / Self Care | Attending: Family Medicine

## 2023-11-11 ENCOUNTER — Other Ambulatory Visit: Payer: Self-pay

## 2023-11-11 DIAGNOSIS — I350 Nonrheumatic aortic (valve) stenosis: Secondary | ICD-10-CM | POA: Diagnosis not present

## 2023-11-11 DIAGNOSIS — R918 Other nonspecific abnormal finding of lung field: Secondary | ICD-10-CM | POA: Diagnosis not present

## 2023-11-11 DIAGNOSIS — K432 Incisional hernia without obstruction or gangrene: Secondary | ICD-10-CM | POA: Diagnosis not present

## 2023-11-11 DIAGNOSIS — I2510 Atherosclerotic heart disease of native coronary artery without angina pectoris: Secondary | ICD-10-CM | POA: Diagnosis not present

## 2023-11-11 DIAGNOSIS — K660 Peritoneal adhesions (postprocedural) (postinfection): Secondary | ICD-10-CM

## 2023-11-11 DIAGNOSIS — K430 Incisional hernia with obstruction, without gangrene: Secondary | ICD-10-CM | POA: Diagnosis not present

## 2023-11-11 DIAGNOSIS — R0989 Other specified symptoms and signs involving the circulatory and respiratory systems: Secondary | ICD-10-CM | POA: Diagnosis not present

## 2023-11-11 DIAGNOSIS — R0902 Hypoxemia: Secondary | ICD-10-CM | POA: Diagnosis not present

## 2023-11-11 HISTORY — PX: VENTRAL HERNIA REPAIR: SHX424

## 2023-11-11 LAB — BASIC METABOLIC PANEL WITH GFR
Anion gap: 12 (ref 5–15)
BUN: 19 mg/dL (ref 8–23)
CO2: 22 mmol/L (ref 22–32)
Calcium: 9.4 mg/dL (ref 8.9–10.3)
Chloride: 101 mmol/L (ref 98–111)
Creatinine, Ser: 1.36 mg/dL — ABNORMAL HIGH (ref 0.44–1.00)
GFR, Estimated: 43 mL/min — ABNORMAL LOW (ref >60)
Glucose, Bld: 96 mg/dL (ref 70–99)
Potassium: 4.1 mmol/L (ref 3.5–5.1)
Sodium: 135 mmol/L (ref 135–145)

## 2023-11-11 LAB — CBC
HCT: 41.8 % (ref 36.0–46.0)
Hemoglobin: 13.8 g/dL (ref 12.0–15.0)
MCH: 30 pg (ref 26.0–34.0)
MCHC: 33 g/dL (ref 30.0–36.0)
MCV: 90.9 fL (ref 80.0–100.0)
Platelets: 249 10*3/uL (ref 150–400)
RBC: 4.6 MIL/uL (ref 3.87–5.11)
RDW: 14.4 % (ref 11.5–15.5)
WBC: 6.6 10*3/uL (ref 4.0–10.5)
nRBC: 0 % (ref 0.0–0.2)

## 2023-11-11 LAB — BLOOD GAS, ARTERIAL
Acid-base deficit: 3.9 mmol/L — ABNORMAL HIGH (ref 0.0–2.0)
Bicarbonate: 21.6 mmol/L (ref 20.0–28.0)
FIO2: 15 %
O2 Content: 95 L/min
O2 Saturation: 98.7 %
Patient temperature: 36
pCO2 arterial: 38 mmHg (ref 32–48)
pH, Arterial: 7.35 (ref 7.35–7.45)
pO2, Arterial: 71 mmHg — ABNORMAL LOW (ref 83–108)

## 2023-11-11 SURGERY — REPAIR, HERNIA, VENTRAL, LAPAROSCOPIC
Anesthesia: General

## 2023-11-11 MED ORDER — FENTANYL CITRATE (PF) 100 MCG/2ML IJ SOLN
INTRAMUSCULAR | Status: AC
  Filled 2023-11-11: qty 2

## 2023-11-11 MED ORDER — LIDOCAINE HCL (PF) 2 % IJ SOLN
INTRAMUSCULAR | Status: DC | PRN
  Administered 2023-11-11: 100 mg via INTRADERMAL

## 2023-11-11 MED ORDER — CEFAZOLIN SODIUM-DEXTROSE 2-4 GM/100ML-% IV SOLN
2.0000 g | Freq: Three times a day (TID) | INTRAVENOUS | Status: AC
  Administered 2023-11-11 – 2023-11-12 (×2): 2 g via INTRAVENOUS
  Filled 2023-11-11 (×2): qty 100

## 2023-11-11 MED ORDER — PHENYLEPHRINE HCL-NACL 20-0.9 MG/250ML-% IV SOLN
INTRAVENOUS | Status: DC | PRN
Start: 2023-11-11 — End: 2023-11-11
  Administered 2023-11-11: 40 ug/min via INTRAVENOUS

## 2023-11-11 MED ORDER — LIDOCAINE HCL (PF) 2 % IJ SOLN
INTRAMUSCULAR | Status: AC
  Filled 2023-11-11: qty 5

## 2023-11-11 MED ORDER — BUPIVACAINE LIPOSOME 1.3 % IJ SUSP
INTRAMUSCULAR | Status: AC
  Filled 2023-11-11: qty 20

## 2023-11-11 MED ORDER — FENTANYL CITRATE (PF) 100 MCG/2ML IJ SOLN
INTRAMUSCULAR | Status: DC | PRN
  Administered 2023-11-11 (×4): 50 ug via INTRAVENOUS

## 2023-11-11 MED ORDER — HEPARIN (PORCINE) 25000 UT/250ML-% IV SOLN
1100.0000 [IU]/h | INTRAVENOUS | Status: DC
  Administered 2023-11-12: 850 [IU]/h via INTRAVENOUS
  Administered 2023-11-13: 900 [IU]/h via INTRAVENOUS
  Administered 2023-11-15 – 2023-11-16 (×2): 1100 [IU]/h via INTRAVENOUS
  Filled 2023-11-11 (×5): qty 250

## 2023-11-11 MED ORDER — DEXAMETHASONE SODIUM PHOSPHATE 10 MG/ML IJ SOLN
INTRAMUSCULAR | Status: AC
  Filled 2023-11-11: qty 1

## 2023-11-11 MED ORDER — TRAMADOL HCL 50 MG PO TABS
50.0000 mg | ORAL_TABLET | Freq: Four times a day (QID) | ORAL | Status: DC | PRN
Start: 2023-11-11 — End: 2023-11-12
  Administered 2023-11-11: 100 mg via ORAL
  Filled 2023-11-11: qty 2

## 2023-11-11 MED ORDER — LACTATED RINGERS IV BOLUS: 1000.0000 mL | Freq: Three times a day (TID) | INTRAVENOUS | Status: DC | PRN

## 2023-11-11 MED ORDER — SUGAMMADEX SODIUM 200 MG/2ML IV SOLN
INTRAVENOUS | Status: DC | PRN
  Administered 2023-11-11: 200 mg via INTRAVENOUS

## 2023-11-11 MED ORDER — ROCURONIUM BROMIDE 10 MG/ML (PF) SYRINGE
PREFILLED_SYRINGE | INTRAVENOUS | Status: DC | PRN
  Administered 2023-11-11: 60 mg via INTRAVENOUS

## 2023-11-11 MED ORDER — CALCIUM POLYCARBOPHIL 625 MG PO TABS
625.0000 mg | ORAL_TABLET | Freq: Two times a day (BID) | ORAL | Status: DC
  Administered 2023-11-12 – 2023-11-16 (×9): 625 mg via ORAL
  Filled 2023-11-11 (×11): qty 1

## 2023-11-11 MED ORDER — 0.9 % SODIUM CHLORIDE (POUR BTL) OPTIME
TOPICAL | Status: DC | PRN
  Administered 2023-11-11: 1000 mL

## 2023-11-11 MED ORDER — ONDANSETRON HCL 4 MG/2ML IJ SOLN
INTRAMUSCULAR | Status: AC
  Filled 2023-11-11: qty 2

## 2023-11-11 MED ORDER — BUPIVACAINE-EPINEPHRINE (PF) 0.25% -1:200000 IJ SOLN
INTRAMUSCULAR | Status: AC
  Filled 2023-11-11: qty 30

## 2023-11-11 MED ORDER — BUPIVACAINE-EPINEPHRINE 0.25% -1:200000 IJ SOLN
INTRAMUSCULAR | Status: DC | PRN
  Administered 2023-11-11: 60 mL

## 2023-11-11 MED ORDER — MAGIC MOUTHWASH: 15.0000 mL | Freq: Four times a day (QID) | ORAL | Status: DC | PRN

## 2023-11-11 MED ORDER — MIDAZOLAM HCL 5 MG/5ML IJ SOLN
INTRAMUSCULAR | Status: DC | PRN
  Administered 2023-11-11: 2 mg via INTRAVENOUS

## 2023-11-11 MED ORDER — PROPOFOL 10 MG/ML IV BOLUS
INTRAVENOUS | Status: DC | PRN
  Administered 2023-11-11: 100 mg via INTRAVENOUS

## 2023-11-11 MED ORDER — CEFAZOLIN SODIUM-DEXTROSE 2-3 GM-%(50ML) IV SOLR
INTRAVENOUS | Status: DC | PRN
  Administered 2023-11-11: 2 g via INTRAVENOUS

## 2023-11-11 MED ORDER — CEFAZOLIN SODIUM-DEXTROSE 2-4 GM/100ML-% IV SOLN
INTRAVENOUS | Status: AC
  Filled 2023-11-11: qty 100

## 2023-11-11 MED ORDER — STERILE WATER FOR IRRIGATION IR SOLN
Status: DC | PRN
  Administered 2023-11-11: 1000 mL

## 2023-11-11 MED ORDER — ONDANSETRON HCL 4 MG/2ML IJ SOLN
INTRAMUSCULAR | Status: DC | PRN
  Administered 2023-11-11: 4 mg via INTRAVENOUS

## 2023-11-11 MED ORDER — METHOCARBAMOL 1000 MG/10ML IJ SOLN: 1000.0000 mg | Freq: Four times a day (QID) | INTRAMUSCULAR | Status: DC | PRN

## 2023-11-11 MED ORDER — LACTATED RINGERS IR SOLN
Status: DC | PRN
Start: 2023-11-11 — End: 2023-11-11
  Administered 2023-11-11: 1000 mL

## 2023-11-11 MED ORDER — TRAMADOL HCL 50 MG PO TABS: 50.0000 mg | ORAL_TABLET | Freq: Four times a day (QID) | ORAL | 0 refills | Status: DC | PRN

## 2023-11-11 MED ORDER — SODIUM CHLORIDE 0.9% FLUSH: 3.0000 mL | INTRAVENOUS | Status: DC | PRN

## 2023-11-11 MED ORDER — ROCURONIUM BROMIDE 10 MG/ML (PF) SYRINGE
PREFILLED_SYRINGE | INTRAVENOUS | Status: AC
  Filled 2023-11-11: qty 10

## 2023-11-11 MED ORDER — HYDROMORPHONE HCL 1 MG/ML IJ SOLN: 0.2500 mg | INTRAMUSCULAR | Status: DC | PRN

## 2023-11-11 MED ORDER — SODIUM CHLORIDE 0.9 % IV SOLN: 250.0000 mL | INTRAVENOUS | Status: DC | PRN

## 2023-11-11 MED ORDER — SODIUM CHLORIDE 0.9% FLUSH
3.0000 mL | Freq: Two times a day (BID) | INTRAVENOUS | Status: DC
  Administered 2023-11-12 – 2023-11-15 (×6): 3 mL via INTRAVENOUS

## 2023-11-11 MED ORDER — SODIUM CHLORIDE 0.9 % IV SOLN: INTRAVENOUS | Status: DC | PRN

## 2023-11-11 MED ORDER — METHOCARBAMOL 500 MG PO TABS
1000.0000 mg | ORAL_TABLET | Freq: Four times a day (QID) | ORAL | Status: DC | PRN
  Administered 2023-11-12 – 2023-11-13 (×2): 1000 mg via ORAL
  Filled 2023-11-11 (×2): qty 2

## 2023-11-11 MED ORDER — MIDAZOLAM HCL 2 MG/2ML IJ SOLN
INTRAMUSCULAR | Status: AC
  Filled 2023-11-11: qty 2

## 2023-11-11 MED ORDER — BUPIVACAINE LIPOSOME 1.3 % IJ SUSP
INTRAMUSCULAR | Status: DC | PRN
  Administered 2023-11-11: 20 mL

## 2023-11-11 MED ORDER — DEXAMETHASONE SODIUM PHOSPHATE 10 MG/ML IJ SOLN
INTRAMUSCULAR | Status: DC | PRN
  Administered 2023-11-11: 8 mg via INTRAVENOUS

## 2023-11-11 MED ORDER — PHENOL 1.4 % MT LIQD: 2.0000 | OROMUCOSAL | Status: DC | PRN

## 2023-11-11 MED ORDER — ALBUMIN HUMAN 5 % IV SOLN
INTRAVENOUS | Status: DC | PRN
Start: 2023-11-11 — End: 2023-11-11

## 2023-11-11 MED ORDER — MENTHOL 3 MG MT LOZG: 1.0000 | LOZENGE | OROMUCOSAL | Status: DC | PRN

## 2023-11-11 SURGICAL SUPPLY — 45 items
BAG COUNTER SPONGE SURGICOUNT (BAG) ×1 IMPLANT
BINDER ABDOMINAL 12 ML 46-62 (SOFTGOODS) IMPLANT
CABLE HIGH FREQUENCY MONO STRZ (ELECTRODE) ×1 IMPLANT
CHLORAPREP W/TINT 26 (MISCELLANEOUS) ×1 IMPLANT
CLIP APPLIE 5 13 M/L LIGAMAX5 (MISCELLANEOUS) IMPLANT
CLSR STERI-STRIP ANTIMIC 1/2X4 (GAUZE/BANDAGES/DRESSINGS) IMPLANT
COVER SURGICAL LIGHT HANDLE (MISCELLANEOUS) ×1 IMPLANT
DEVICE SECURE STRAP 25 ABSORB (INSTRUMENTS) ×1 IMPLANT
DEVICE TROCAR PUNCTURE CLOSURE (ENDOMECHANICALS) ×1 IMPLANT
DRAIN CHANNEL 19F RND (DRAIN) IMPLANT
DRAPE INCISE IOBAN 66X45 STRL (DRAPES) IMPLANT
DRAPE WARM FLUID 44X44 (DRAPES) ×1 IMPLANT
DRSG IV TEGADERM 3.5X4.5 STRL (GAUZE/BANDAGES/DRESSINGS) IMPLANT
DRSG TEGADERM 2-3/8X2-3/4 SM (GAUZE/BANDAGES/DRESSINGS) ×3 IMPLANT
DRSG TEGADERM 4X4.75 (GAUZE/BANDAGES/DRESSINGS) ×1 IMPLANT
ELECT REM PT RETURN 15FT ADLT (MISCELLANEOUS) ×1 IMPLANT
EVACUATOR SILICONE 100CC (DRAIN) IMPLANT
GAUZE SPONGE 2X2 8PLY STRL LF (GAUZE/BANDAGES/DRESSINGS) IMPLANT
GLOVE ECLIPSE 8.0 STRL XLNG CF (GLOVE) ×1 IMPLANT
GLOVE INDICATOR 8.0 STRL GRN (GLOVE) ×1 IMPLANT
GOWN STRL REUS W/ TWL XL LVL3 (GOWN DISPOSABLE) ×1 IMPLANT
IRRIGATION SUCT STRKRFLW 2 WTP (MISCELLANEOUS) IMPLANT
KIT BASIN OR (CUSTOM PROCEDURE TRAY) ×1 IMPLANT
KIT TURNOVER KIT A (KITS) IMPLANT
MARKER SKIN DUAL TIP RULER LAB (MISCELLANEOUS) ×1 IMPLANT
MESH VENTRALIGHT ST 10X13IN (Mesh General) IMPLANT
NDL INSUFFLATION 14GA 120MM (NEEDLE) IMPLANT
NDL SPNL 22GX3.5 QUINCKE BK (NEEDLE) IMPLANT
NEEDLE INSUFFLATION 14GA 120MM (NEEDLE) IMPLANT
NEEDLE SPNL 22GX3.5 QUINCKE BK (NEEDLE) IMPLANT
PAD POSITIONING PINK XL (MISCELLANEOUS) ×1 IMPLANT
SCISSORS LAP 5X35 DISP (ENDOMECHANICALS) ×1 IMPLANT
SET TUBE SMOKE EVAC HIGH FLOW (TUBING) ×1 IMPLANT
SLEEVE ADV FIXATION 5X100MM (TROCAR) ×1 IMPLANT
SPIKE FLUID TRANSFER (MISCELLANEOUS) ×1 IMPLANT
STRIP CLOSURE SKIN 1/2X4 (GAUZE/BANDAGES/DRESSINGS) ×2 IMPLANT
SUT MNCRL AB 4-0 PS2 18 (SUTURE) ×1 IMPLANT
SUT PDS AB 1 CT1 27 (SUTURE) ×5 IMPLANT
SUT PROLENE 1 CT 1 30 (SUTURE) IMPLANT
SUT PROLENE 2 0 SH DA (SUTURE) IMPLANT
TOWEL OR 17X26 10 PK STRL BLUE (TOWEL DISPOSABLE) ×1 IMPLANT
TRAY LAPAROSCOPIC (CUSTOM PROCEDURE TRAY) ×1 IMPLANT
TROCAR 11X100 Z THREAD (TROCAR) IMPLANT
TROCAR ADV FIXATION 5X100MM (TROCAR) ×1 IMPLANT
TROCAR XCEL NON-BLD 5MMX100MML (ENDOMECHANICALS) ×1 IMPLANT

## 2023-11-11 NOTE — Anesthesia Postprocedure Evaluation (Signed)
 Anesthesia Post Note  Patient: ASRA MCWETHY  Procedure(s) Performed: REPAIR, HERNIA, VENTRAL, LAPAROSCOPIC,LYSIS OF ADHESIONS, BILATERAL TAP BLOCK     Patient location during evaluation: PACU Anesthesia Type: General Level of consciousness: sedated Pain management: pain level controlled Vital Signs Assessment: post-procedure vital signs reviewed and stable Respiratory status: spontaneous breathing, nonlabored ventilation, respiratory function stable and patient connected to face mask oxygen Cardiovascular status: blood pressure returned to baseline and stable Postop Assessment: no apparent nausea or vomiting Anesthetic complications: no  No notable events documented.  Last Vitals:  Vitals:   11/11/23 1815 11/11/23 1830  BP: (!) 108/51 (!) 126/59  Pulse: 83 81  Resp: (!) 22 16  Temp:    SpO2: (!) 88% 94%    Last Pain:  Vitals:   11/11/23 1830  TempSrc:   PainSc: Asleep                 Amon Costilla,W. EDMOND

## 2023-11-11 NOTE — Discharge Instructions (Signed)
 HERNIA REPAIR: POST OP INSTRUCTIONS  ######################################################################  EAT Gradually transition to a high fiber diet with a fiber supplement over the next few weeks after discharge.  Start with a pureed / full liquid diet (see below)  WALK Walk an hour a day.  Control your pain to do that.    CONTROL PAIN Control pain so that you can walk, sleep, tolerate sneezing/coughing, and go up/down stairs.  HAVE A BOWEL MOVEMENT DAILY Keep your bowels regular to avoid problems.  OK to try a laxative to override constipation.  OK to use an antidairrheal to slow down diarrhea.  Call if not better after 2 tries  CALL IF YOU HAVE PROBLEMS/CONCERNS Call if you are still struggling despite following these instructions. Call if you have concerns not answered by these instructions  ######################################################################    DIET: Follow a light bland diet & liquids the first 24 hours after arrival home, such as soup, liquids, starches, etc.  Be sure to drink plenty of fluids.  Quickly advance to a usual solid diet within a few days.  Avoid fast food or heavy meals as your are more likely to get nauseated or have irregular bowels.  A low-fat, high-fiber diet for the rest of your life is ideal.   Take your usually prescribed home medications unless otherwise directed.  PAIN CONTROL: Pain is best controlled by a usual combination of three different methods TOGETHER: Ice/Heat Over the counter pain medication Prescription pain medication Most patients will experience some swelling and bruising around the hernia(s) such as the bellybutton, groins, or old incisions.  Ice packs or heating pads (30-60 minutes up to 6 times a day) will help. Use ice for the first few days to help decrease swelling and bruising, then switch to heat to help relax tight/sore spots and speed recovery.  Some people prefer to use ice alone, heat alone, alternating  between ice & heat.  Experiment to what works for you.  Swelling and bruising can take several weeks to resolve.   It is helpful to take an over-the-counter pain medication regularly for the first few weeks.  Choose one of the following that works best for you: Naproxen (Aleve, etc)  Two 220mg  tabs twice a day Ibuprofen (Advil, etc) Three 200mg  tabs four times a day (every meal & bedtime) Acetaminophen  (Tylenol , etc) 325-650mg  four times a day (every meal & bedtime) A  prescription for pain medication should be given to you upon discharge.  Take your pain medication as prescribed.  If you are having problems/concerns with the prescription medicine (does not control pain, nausea, vomiting, rash, itching, etc), please call us  [phone] to see if we need to switch you to a different pain medicine that will work better for you and/or control your side effect better. If you need a refill on your pain medication, please contact your pharmacy.  They will contact our office to request authorization. Prescriptions will not be filled after 5 pm or on week-ends.  AVOID GETTING CONSTIPATED.   Between the surgery and the pain medications, it is common to experience some constipation.  Drink plenty of liquids Take a fiber supplement 2 times day (such as Metamucil, Citrucel, FiberCon, MiraLax , etc) to have a bowel movement every day. If you have not had a BM by 2 days after surgery: -drink liquids only until you have a bowel movement -take MiraLAX  2 doses every 2 hours until you have a bowel movement   Wash / shower every day.  You may  shower over the dressings as they are waterproof.    It is good for closed incisions and even open wounds to be washed every day.  Shower every day.  Short baths are fine.  Wash the incisions and wounds clean with soap & water .    You may leave closed incisions open to air if it is dry.   You may cover the incision with clean gauze & replace it after your daily shower for  comfort.  TEGADERM & STERISTRIPS:  You have clear gauze band-aid dressings over your closed incision(s).  You also have skin tapes called Steristrips on your incisions.  Leave these in place.  You may trim any edges that curl up with clean scissors.  You may remove all dressings & tapes in the shower in 2 days = 5/3.    ACTIVITIES as tolerated:   You may resume regular (light) daily activities beginning the next day--such as daily self-care, walking, climbing stairs--gradually increasing activities as tolerated.  Control your pain so that you can walk an hour a day.  If you can walk 30 minutes without difficulty, it is safe to try more intense activity such as jogging, treadmill, bicycling, low-impact aerobics, swimming, etc. Save the most intensive and strenuous activity for last such as sit-ups, heavy lifting, contact sports, etc  Refrain from any heavy lifting or straining until you are off narcotics for pain control.   DO NOT PUSH THROUGH PAIN.  Let pain be your guide: If it hurts to do something, don't do it.  Pain is your body warning you to avoid that activity for another week until the pain goes down. You may drive when you are no longer taking prescription pain medication, you can comfortably wear a seatbelt, and you can safely maneuver your car and apply brakes. You may have sexual intercourse when it is comfortable.   FOLLOW UP in our office Please call [facility] at [phone] to set up an appointment to see your surgeon in the office for a follow-up appointment approximately 2-3 weeks after your surgery. Make sure that you call for this appointment the day you arrive home to insure a convenient appointment time.  9.  If you have disability of FMLA / Family leave forms, please bring the forms to the office for processing.  (do not give to your surgeon).  WHEN TO CALL US  [phone]: Poor pain control Reactions / problems with new medications (rash/itching, nausea, etc)  Fever over  101.5 F (38.5 C) Inability to urinate Nausea and/or vomiting Worsening swelling or bruising Continued bleeding from incision. Increased pain, redness, or drainage from the incision   The clinic staff is available to answer your questions during regular business hours (8:30am-5pm).  Please don't hesitate to call and ask to speak to one of our nurses for clinical concerns.   If you have a medical emergency, go to the nearest emergency room or call 911.  A surgeon from [facility] is always on call at the hospitals in [facility] [place] [address], [place], [place]  [address] ?  [address] [address], [place], [place]   [address] MAIN: [phone] ? TOLL FREE: [phone] ? FAX: [phone] [email or web address]

## 2023-11-11 NOTE — Plan of Care (Signed)

## 2023-11-11 NOTE — Op Note (Signed)
 11/11/2023  PATIENT:  Elizabeth Morton  68 y.o. female  Patient Care Team: Azalia Leo, MD as PCP - General (Internal Medicine) Candyce Champagne, MD as Attending Physician (General Surgery) Elmyra Haggard, MD as Consulting Physician (Cardiology) Armbruster, Lendon Queen, MD as Consulting Physician (Gastroenterology)  PRE-OPERATIVE DIAGNOSIS:  VENTRAL INCISIONAL RECURRENT AND INCARCERATED Dimensions of hernia by pre-op evaluation:  7cm x 5cm  POST-OPERATIVE DIAGNOSIS:  VENTRAL INCISIONAL RECURRENT AND INCARCERATED  Dimensions of hernia post-op:  18cm x 7cm  PROCEDURE:   LAPAROSCOPIC REPAIR OF ABDOMINAL HERNIA WITH MESH  (See OR Findings below) LAPAROSCOPIC LYSIS OF ADHESIONS REMOVAL OF OLD MESH  TAP BLOCK - BILATERAL  SURGEON:  Eddye Goodie, MD  ASSISTANT:  (n/a)   ANESTHESIA:  General endotracheal intubation anesthesia (GETA) and Regional TRANSVERSUS ABDOMINIS PLANE (TAP) nerve block -BILATERAL for perioperative & postoperative pain control at the level of the transverse abdominis & preperitoneal spaces along the flank at the anterior axillary line, from subcostal ridge to iliac crest under laparoscopic guidance provided with liposomal bupivacaine  (Experel) 20mL mixed with 60 mL of bupivicaine 0.25% with epinephrine   Estimated Blood Loss (EBL):   Total I/O In: 1000 [I.V.:750; IV Piggyback:250] Out: 275 [Urine:225; Blood:50].   (See anesthesia record)  Delay start of Pharmacological VTE agent (>24hrs) due to concerns of significant anemia, surgical blood loss, or risk of bleeding?:  no  DRAINS: (None)  SPECIMEN:  Hernia sac (sent) and Old mesh  DISPOSITION OF SPECIMEN:  Pathology  COUNTS:  Sponge, needle, & instrument counts CORRECT  PLAN OF CARE: Admit for overnight observation  PATIENT DISPOSITION:  PACU - hemodynamically stable.  INDICATION: Pleasant patient has developed a ventral wall abdominal hernia. Recommendation was made for surgical repair.  Patient fully  anticoagulated with mechanical valve with clotting issues.  Recommend by cardiology to have anticoagulation bridge.  Intolerance to Lovenox  with severe issues so therefore preadmit on heparin  drip per internal medicine/cardiology.  Cleared for surgery.  The anatomy & physiology of the abdominal wall was discussed. The pathophysiology of hernias was discussed. Natural history risks without surgery including progeressive enlargement, pain, incarceration & strangulation was discussed. Contributors to complications such as smoking, obesity, diabetes, prior surgery, etc were discussed.  I feel the risks of no intervention will lead to serious problems that outweigh the operative risks; therefore, I recommended surgery to reduce and repair the hernia. I explained laparoscopic techniques with possible need for an open approach. I noted the probable use of mesh to patch and/or buttress the hernia repair.  Risks such as bleeding, infection, abscess, need for further treatment, heart attack, death, and other risks were discussed. I noted a good likelihood this will help address the problem. Goals of post-operative recovery were discussed as well. Possibility that this will not correct all symptoms was explained. I stressed the importance of low-impact activity, aggressive pain control, avoiding constipation, & not pushing through pain to minimize risk of post-operative chronic pain or injury. Possibility of reherniation especially with smoking, obesity, diabetes, immunosuppression, and other health conditions was discussed. We will work to minimize complications.   An educational handout further explaining the pathology & treatment options was given as well. Questions were answered. The patient expresses understanding & wishes to proceed with surgery.   OR FINDINGS: Umbilical supraumbilical recurrent incisional hernias with old disc of mesh partially herniated into the squishy hernia sac.  Incarcerated the large  volume omentum and colon nearby.  Swiss cheese hernias within significant diastases recti with a dominant  Subxiphoid hernia 5 x 4 cm containing falciform ligament and omentum. .  Type of ventral wall repair:  Laparoscopic underlay repair .with Primary repair of largest hernia Placement of mesh: Centrally intraperitoneal with edges tucked into RECTRORECTUS & preperitoneal space Name of mesh: Bard Ventralight dual sided (polypropylene / Seprafilm) Size of mesh: 33x27cm Orientation: Vertical Mesh overlap:  7cm    DESCRIPTION:   Informed consent was confirmed. The patient underwent general anaesthesia without difficulty. The patient was positioned appropriately. VTE prevention in place. The patient's abdomen was clipped, prepped, & draped in a sterile fashion. Surgical timeout confirmed our plan.  The patient was positioned in reverse Trendelenburg. Abdominal entry was gained using optical entry technique in the left upper abdomen. Entry was clean. I induced carbon dioxide insufflation. Camera inspection revealed no injury. Extra ports were carefully placed under direct laparoscopic visualization.   I could see adhesions on the parietal peritoneum under the abdominal wall.   I did laparoscopic lysis of adhesions to expose the entire anterior abdominal wall.  I primarily used focused sharp dissection.  Freed greater omentum off a fibrotic disc consistent with the prior Ventralex patch.  It did partially herniated up into the supraumbilical as well as epigastric hernia nearby.  Had to free that off.  I freed off the falciform ligament and central peritoneum to expose the retrorectus fascia   encountered subxiphoid hernia as well also freed falciform ligament above the subcostal ridge and subxiphoid/sternal region to expose all hernias.  I made sure hemostasis was good.  I mapped out the region using a needle passer.   To minimize recurrence & ensure that I would have at least 5 cm radial coverage beyond  the hernia defect, I chose a 33x27cm dual sided mesh.  I placed #1 PDS stitches around its edge about every 5 cm = 12 total.    I removed the hernia sac as well as the old explanted mesh.  Did that through the dominant hernia through a higher transverse supraumbilical incision.  I rolled the mesh & placed into the peritoneal cavity through the hernia defect.  I unrolled the mesh and positioned it appropriately.  I secured the mesh to cover up the hernia defect using a laparoscopic suture passer to pass the tails of the PDS through the abdominal wall & tagged them with clamps for good transfascial suturing.  I started out in four corners to make sure I had the mesh centered under the hernia defect appropriately, and then proceeded to work in quadrants.    We evacuated CO2 & desufflated the abdomen.  I tied the fascial stitches down. I closed the fascial defect that I placed the mesh through using #1 PDS primarily interrupted stitchestransversely.  I reinsufflated the abdomen. The mesh provided at least circumferential coverage around the entire region of hernia defects.  I secured the mesh centrally with an additional trans fascial stitch in & out the mesh using #1 PDS under laparoscopic visualization.   I tacked the edges & central part of the mesh to the peritoneum/posterior rectus fascia with SecureStrap absorbable tacks.   I did reinspection. Hemostasis was good. Mesh laid well. I completed a broad field block of local anesthesia at fascial stitch sites & fascial closure areas.    Capnoperitoneum was evacuated. Ports were removed. The skin was closed with Monocryl at the port sites and Steri-Strips on the fascial stitch puncture sites.  Patient is being extubated to go to the recovery room.  I discussed operative findings,  updated the patient's status, discussed probable steps to recovery, and gave postoperative recommendations to the patient's spouse, Currie Douse.  Recommendations were made.  Questions were  answered.  He expressed understanding & appreciation.  Eddye Goodie, M.D., F.A.C.S. Gastrointestinal and Minimally Invasive Surgery [facility] [address], [address] [place], [place] [address] [phone] Main / Paging  11/11/2023 2:54 PM

## 2023-11-11 NOTE — Interval H&P Note (Signed)
 History and Physical Interval Note:  11/11/2023 11:17 AM  Elizabeth Morton  has presented today for surgery, with the diagnosis of VENTRAL INCISIONAL RECURRENT AND INCARCERATED.  The various methods of treatment have been discussed with the patient and family. After consideration of risks, benefits and other options for treatment, the patient has consented to  Procedure(s) with comments: REPAIR, HERNIA, VENTRAL, LAPAROSCOPIC (N/A) - LAPAROSCOPIC VENTRAL WALL HERNIA REPAIR WITH LYSIS OF ADHESIONS as a surgical intervention.  The patient's history has been reviewed, patient examined, no change in status, stable for surgery.  I have reviewed the patient's chart and labs.  Questions were answered to the patient's satisfaction.    I have re-reviewed the the patient's records, history, medications, and allergies.  I have re-examined the patient.  I again discussed intraoperative plans and goals of post-operative recovery.  The patient agrees to proceed.  Elizabeth Morton  [birth year] [id number]  Patient Care Team: Azalia Leo, MD as PCP - General (Internal Medicine) Candyce Champagne, MD as Attending Physician (General Surgery) Elmyra Haggard, MD as Consulting Physician (Cardiology) Armbruster, Lendon Queen, MD as Consulting Physician (Gastroenterology)  Patient Active Problem List   Diagnosis Date Noted   Recurrent incisional hernia with incarceration 06/07/2023    Priority: Medium    Warfarin anticoagulation     Priority: Medium    Chronic insomnia 11/09/2023   Gout 11/09/2023   Hypertensive heart disease with congestive heart failure and chronic kidney disease (HCC) 11/09/2023   Left nephrolithiasis 11/09/2023   Obesity 11/09/2023   Hardening of the aorta (main artery of the heart) (HCC) 11/09/2023   Gastro-esophageal reflux disease without esophagitis 09/30/2023   Hiatal hernia 06/07/2023   Atherosclerotic heart disease of native coronary artery without angina pectoris 09/06/2019   Hyperlipidemia  09/06/2019   Hemiplegia of nondominant side as late effect of cerebrovascular disease (HCC) 09/06/2019   History of colonic polyps 09/06/2019   Age-related osteoporosis without current pathological fracture 09/06/2019   Nicotine dependence 09/06/2019   Presence of prosthetic heart valve 09/06/2019   Stage 3a chronic kidney disease (HCC) 09/06/2019   Long term current use of anticoagulant therapy 09/06/2019   Sepsis (HCC) 01/18/2019   Bacteremia due to methicillin susceptible Staphylococcus aureus (MSSA) 01/18/2019   Acute kidney injury (HCC) 01/18/2019   Thrombocytopenia (HCC) 01/18/2019   Chronic hepatitis C without hepatic coma (HCC) 05/04/2017   S/P repair of ventral hernia Nov 2017 05/15/2016   Recurrent ventral hernia 05/15/2016   Chronic congestive heart failure with left ventricular diastolic dysfunction (HCC) 06/25/2015   Hypothyroidism 06/25/2015   S/P aortic valve replacement    Aortic stenosis    Chronic cholecystitis with calculus 05/25/2012   Constipation, chronic 05/25/2012   Needs screening Colonoscopy - Pt refused 05/25/2012   Hypertension    Hx of CABG    History of CVA (cerebrovascular accident)    Dyslipidemia    Shortness of breath 04/16/2010    Past Medical History:  Diagnosis Date   Aortic stenosis    Aortic valve replacement 2006   Bacteremia 01/19/2019   CAD (coronary artery disease)    a. s/p CABG 2006 at time of AVR.   CVA (cerebral infarction)    related to thrombosed aortic root aneurysm   Dyslipidemia    Dyspnea    Gallstones    GERD (gastroesophageal reflux disease)    Gout    Heart murmur    History of kidney stones    Hx of CABG    2006,LIMA  to LAD, SVG to diagonal   Hx of transfusion of packed red blood cells    Hypertension    Hypothyroidism    Iron deficiency anemia    Lower GI bleed 06/2015   Pericardial effusion    Insignificant small pericardial effusion seen on echo, November, 2013   Peripheral vascular disease (HCC) 2006    blood clot -stroke   Pneumonia    S/P aortic valve replacement    a. Bentall procedure,#21 St. Jude mechanical valve conduit with reimplantation of the coronaries 2006   Status post colonoscopy with polypectomy    "bleeding; colonoscopy was ~ 06/18/2015"   Stroke [facility]) 2006   no deficits    Warfarin anticoagulation    coumadin  therapy    Past Surgical History:  Procedure Laterality Date   AORTIC VALVE REPLACEMENT  2006   BACK SURGERY     CARDIAC CATHETERIZATION  ~ 2006   CARDIAC VALVE REPLACEMENT     CESAREAN SECTION  1985   COLONOSCOPY N/A 06/26/2015   Procedure: COLONOSCOPY;  Surgeon: Kenney Peacemaker, MD;  Location: [facility] ENDOSCOPY;  Service: Endoscopy;  Laterality: N/A;   COLONOSCOPY W/ BIOPSIES AND POLYPECTOMY  06/18/2015   CORONARY ARTERY BYPASS GRAFT  2006   ESOPHAGOGASTRODUODENOSCOPY  06/18/2015   INSERTION OF MESH  05/15/2016   Procedure: INSERTION OF MESH;  Surgeon: Jacolyn Matar, MD;  Location: [facility] ORS;  Service: General;;   LAPAROSCOPIC ASSISTED VENTRAL HERNIA REPAIR N/A 05/15/2016   Procedure: LAPAROSCOPIC ASSISTED VENTRAL HERNIA REPAIR WITH MESH;  Surgeon: Jacolyn Matar, MD;  Location: [facility] ORS;  Service: General;  Laterality: N/A;   LAPAROSCOPIC CHOLECYSTECTOMY SINGLE PORT  07/21/2012   Procedure: LAPAROSCOPIC CHOLECYSTECTOMY SINGLE PORT;  Surgeon: Eddye Goodie, MD;  Location: [facility] OR;  Service: General;  Laterality: N/A;   LUMBAR FUSION  ~ 1973   TEE WITHOUT CARDIOVERSION N/A 01/19/2019   Procedure: TRANSESOPHAGEAL ECHOCARDIOGRAM (TEE);  Surgeon: Lenise Quince, MD;  Location: [facility] ENDOSCOPY;  Service: Cardiovascular;  Laterality: N/A;   TONSILLECTOMY      Social History   Socioeconomic History   Marital status: Married    Spouse name: Not on file   Number of children: 1   Years of education: Not on file   Highest education level: Not on file  Occupational History   Occupation: homemaker  Tobacco Use   Smoking status: Former    Current packs/day: 0.00    Average  packs/day: 1 pack/day for 25.0 years (25.0 ttl pk-yrs)    Types: Cigarettes    Start date: 07/16/1979    Quit date: 07/15/2004    Years since quitting: 19.3   Smokeless tobacco: Never  Vaping Use   Vaping status: Never Used  Substance and Sexual Activity   Alcohol use: No   Drug use: No   Sexual activity: Not Currently  Other Topics Concern   Not on file  Social History Narrative   She lives in [place] with her husband and son. She previously smoked 37-pack-years,but quit following her stroke in April of 2006. She denies any alcohol or drugs. She has not been routinely exercising.   Social Drivers of Corporate investment banker Strain: Not on file  Food Insecurity: No Food Insecurity (11/08/2023)   Hunger Vital Sign    Worried About Running Out of Food in the Last Year: Never true    Ran Out of Food in the Last Year: Never true  Transportation Needs: No Transportation Needs (11/08/2023)   PRAPARE - Transportation  Lack of Transportation (Medical): No    Lack of Transportation (Non-Medical): No  Physical Activity: Not on file  Stress: Not on file  Social Connections: Moderately Integrated (11/09/2023)   Social Connection and Isolation Panel [NHANES]    Frequency of Communication with Friends and Family: Once a week    Frequency of Social Gatherings with Friends and Family: Once a week    Attends Religious Services: 1 to 4 times per year    Active Member of [employer] or Organizations: Yes    Attends Banker Meetings: 1 to 4 times per year    Marital Status: Married  Catering manager Violence: Not At Risk (11/08/2023)   Humiliation, Afraid, Rape, and Kick questionnaire    Fear of Current or Ex-Partner: No    Emotionally Abused: No    Physically Abused: No    Sexually Abused: No    Family History  Problem Relation Age of Onset   Stroke Mother    Heart disease Mother    Alcohol abuse Father    Hypertension Father    Stroke Father    Hyperlipidemia Father     Stroke Brother        she has three and at least one of them has had a stroke   Colon cancer Neg Hx    Breast cancer Neg Hx     Medications Prior to Admission  Medication Sig Dispense Refill Last Dose/Taking   acetaminophen  (TYLENOL ) 500 MG tablet Take 500-1,000 mg by mouth every 6 (six) hours as needed for headache, moderate pain (pain score 4-6) or mild pain (pain score 1-3) (Sleep).      allopurinol  (ZYLOPRIM ) 100 MG tablet Take 100 mg by mouth daily.      Calcium  Carbonate-Vitamin D 600-400 MG-UNIT tablet Take 1 tablet by mouth 2 (two) times daily.      colchicine  0.6 MG tablet Take 1 tablet (0.6 mg total) by mouth daily for 3 doses. Take 2 tablets right away, take the 2nd dose 1.5 hours later (Patient taking differently: Take 0.6 mg by mouth See admin instructions.  For flare-up Take 2 tablets right away, take the 2nd dose 1.5 hours later) 3 tablet 0    furosemide  (LASIX ) 20 MG tablet Take 20 mg by mouth daily.  5    levothyroxine  (SYNTHROID ) 75 MCG tablet Take 75 mcg by mouth daily before breakfast.      losartan  (COZAAR ) 25 MG tablet Take 50 mg by mouth daily.      Multiple Vitamins-Minerals (PRESERVISION/LUTEIN PO) Take 1 capsule by mouth daily with breakfast.       naphazoline-glycerin (CLEAR EYES REDNESS) 0.012-0.25 % SOLN Place 1-2 drops into both eyes 4 (four) times daily as needed for eye irritation.      pantoprazole  (PROTONIX ) 40 MG tablet Take 40 mg by mouth daily.      rosuvastatin  (CRESTOR ) 20 MG tablet Take 20 mg by mouth daily.      warfarin (COUMADIN ) 3 MG tablet TAKE 1 TABLET DAILY EXCEPT 1/2 TABLET ON SUNDAYS OR AS DIRECTED BY ANTICOAGULATION CLINIC. 90 tablet 1     Current Facility-Administered Medications  Medication Dose Route Frequency Provider Last Rate Last Admin   [MAR Hold] albuterol  (PROVENTIL ) (2.5 MG/3ML) 0.083% nebulizer solution 2.5 mg  2.5 mg Nebulization Q2H PRN Jannette Mend, Mir M, MD       [MAR Hold] allopurinol  (ZYLOPRIM ) tablet 100 mg  100 mg Oral  Daily Jannette Mend, Mir M, MD   100 mg at 11/11/23 [phone]  bupivacaine  liposome (EXPAREL ) 1.3 % injection 266 mg  20 mL Infiltration Once Candyce Champagne, MD       ceFAZolin  (ANCEF ) 2-4 GM/100ML-% IVPB            [MAR Hold] furosemide  (LASIX ) tablet 20 mg  20 mg Oral Daily Danford, Willis Harter, MD       [MAR Hold] levothyroxine  (SYNTHROID ) tablet 75 mcg  75 mcg Oral QAC breakfast Ephriam Hashimoto, MD   75 mcg at 11/11/23 0827   [MAR Hold] losartan  (COZAAR ) tablet 50 mg  50 mg Oral Daily Danford, Willis Harter, MD       [MAR Hold] ondansetron  (ZOFRAN ) injection 4 mg  4 mg Intravenous Q6H PRN Jannette Mend, Mir M, MD       Evette Hoes Hold] pantoprazole  (PROTONIX ) EC tablet 40 mg  40 mg Oral Daily Ikramullah, Mir M, MD   40 mg at 11/11/23 1610   [facility] Hold] rosuvastatin  (CRESTOR ) tablet 20 mg  20 mg Oral Daily Jannette Mend, Mir M, MD   20 mg at 11/11/23 9604     No Known Allergies  BP (!) 147/57   Pulse 75   Temp 97.6 F (36.4 C) (Oral)   Resp 18   Ht 5\' 2"  (1.575 m)   Wt 78.2 kg   SpO2 98%   BMI 31.53 kg/m   Labs: Results for orders placed or performed during the hospital encounter of 11/08/23 (from the past 48 hours)  Heparin  level (unfractionated)     Status: None   Collection Time: 11/09/23  2:55 PM  Result Value Ref Range   Heparin  Unfractionated 0.50 0.30 - 0.70 IU/mL    Comment: (NOTE) The clinical reportable range upper limit is being lowered to >1.10 to align with the FDA approved guidance for the current laboratory assay.  If heparin  results are below expected values, and patient dosage has  been confirmed, suggest follow up testing of antithrombin III levels. Performed at [facility], [address] [place], [place] [address]   Protime-INR     Status: None   Collection Time: 11/10/23  5:47 AM  Result Value Ref Range   Prothrombin Time 14.7 11.4 - 15.2 seconds   INR 1.1 0.8 - 1.2    Comment: (NOTE) INR goal varies based on device and disease  states. Performed at [facility], [address], [place], [place] [address]   CBC     Status: None   Collection Time: 11/10/23  5:47 AM  Result Value Ref Range   WBC 5.5 4.0 - 10.5 K/uL   RBC 4.35 3.87 - 5.11 MIL/uL   Hemoglobin 12.8 12.0 - 15.0 g/dL   HCT 78.2 95.6 - 21.3 %   MCV 91.3 80.0 - 100.0 fL   MCH 29.4 26.0 - 34.0 pg   MCHC 32.2 30.0 - 36.0 g/dL   RDW 08.6 57.8 - 46.9 %   Platelets 236 150 - 400 K/uL   nRBC 0.0 0.0 - 0.2 %    Comment: Performed at [facility], [address], [place], [place] [address]  Basic metabolic panel with GFR     Status: Abnormal   Collection Time: 11/10/23  5:47 AM  Result Value Ref Range   Sodium 135 135 - 145 mmol/L   Potassium 3.6 3.5 - 5.1 mmol/L   Chloride 104 98 - 111 mmol/L   CO2 23 22 - 32 mmol/L   Glucose, Bld 101 (H) 70 - 99 mg/dL    Comment:  Glucose reference range applies only to samples taken after fasting for at least 8 hours.   BUN 19 8 - 23 mg/dL   Creatinine, Ser 4.09 (H) 0.44 - 1.00 mg/dL   Calcium  9.2 8.9 - 10.3 mg/dL   GFR, Estimated 46 (L) >60 mL/min    Comment: (NOTE) Calculated using the CKD-EPI Creatinine Equation (2021)    Anion gap 8 5 - 15    Comment: Performed at [facility], [address] [place], [place] [address]  Heparin  level (unfractionated)     Status: None   Collection Time: 11/10/23  5:47 AM  Result Value Ref Range   Heparin  Unfractionated 0.51 0.30 - 0.70 IU/mL    Comment: (NOTE) The clinical reportable range upper limit is being lowered to >1.10 to align with the FDA approved guidance for the current laboratory assay.  If heparin  results are below expected values, and patient dosage has  been confirmed, suggest follow up testing of antithrombin III levels. Performed at [facility], [address], [place], [place] [address]   CBC     Status: None   Collection Time: 11/11/23  5:49 AM  Result Value Ref Range    WBC 6.6 4.0 - 10.5 K/uL   RBC 4.60 3.87 - 5.11 MIL/uL   Hemoglobin 13.8 12.0 - 15.0 g/dL   HCT 56.2 13.0 - 86.5 %   MCV 90.9 80.0 - 100.0 fL   MCH 30.0 26.0 - 34.0 pg   MCHC 33.0 30.0 - 36.0 g/dL   RDW 78.4 69.6 - 29.5 %   Platelets 249 150 - 400 K/uL   nRBC 0.0 0.0 - 0.2 %    Comment: Performed at [facility], [address], [place], [place] [address]  Basic metabolic panel with GFR     Status: Abnormal   Collection Time: 11/11/23  5:49 AM  Result Value Ref Range   Sodium 135 135 - 145 mmol/L   Potassium 4.1 3.5 - 5.1 mmol/L    Comment: HEMOLYSIS AT THIS LEVEL MAY AFFECT RESULT   Chloride 101 98 - 111 mmol/L   CO2 22 22 - 32 mmol/L   Glucose, Bld 96 70 - 99 mg/dL    Comment: Glucose reference range applies only to samples taken after fasting for at least 8 hours.   BUN 19 8 - 23 mg/dL   Creatinine, Ser 2.44 (H) 0.44 - 1.00 mg/dL   Calcium  9.4 8.9 - 10.3 mg/dL   GFR, Estimated 43 (L) >60 mL/min    Comment: (NOTE) Calculated using the CKD-EPI Creatinine Equation (2021)    Anion gap 12 5 - 15    Comment: Performed at [facility], [address], [place], [place] [address]    Imaging / Studies: MM 3D SCREENING MAMMOGRAM BILATERAL BREAST Result Date: 10/14/2023 CLINICAL DATA:  Screening. EXAM: DIGITAL SCREENING BILATERAL MAMMOGRAM WITH TOMOSYNTHESIS AND CAD TECHNIQUE: Bilateral screening digital craniocaudal and mediolateral oblique mammograms were obtained. Bilateral screening digital breast tomosynthesis was performed. The images were evaluated with computer-aided detection. COMPARISON:  Previous exam(s). ACR Breast Density Category b: There are scattered areas of fibroglandular density. FINDINGS: There are no findings suspicious for malignancy. IMPRESSION: No mammographic evidence of malignancy. A result letter of this screening mammogram will be mailed directly to the patient. RECOMMENDATION: Screening mammogram in one year. (Code:SM-B-01Y)  BI-RADS CATEGORY  1: Negative. Electronically Signed   By: Alger Infield M.D.   On: 10/14/2023 08:11     .Landon Pinion  C. Hershell Lose, M.D., F.A.C.S. Gastrointestinal and Minimally Invasive Surgery [facility] [address] [place], [place] [address] [phone] Main / Paging  11/11/2023 11:17 AM    Eddye Goodie

## 2023-11-11 NOTE — Progress Notes (Signed)
  Progress Note   Patient: Elizabeth Morton XBM:[id number] DOB: February 23, 1956 DOA: 11/08/2023     3 DOS: the patient was seen and examined on 11/11/2023 at 4:30PM      Brief hospital course: 68 y.o. F with hx CVA due to aortic aneurysm, on warfarin, hx mech aortic valve after stroke, CAD s/p CABG, and hypothyroidism who was admitted for heparin  bridge prior to elective hernia repair.       Assessment and Plan: * Recurrent ventral hernia S/p laparoscopic repair of ventral hernia with mesh 5/1 by Dr. Hershell Lose -Postop care per general surgery - Hold anticoagulation until cleared by general surgery   Acute respiratory failure with hypoxia Postop she is very hypoxic, requiring nonrebreather.  Lung sounds are clear, respiratory rate seems normal.  ABG shows hypoxia, no hypercarbia, pH normal.  No prior history of COPD, no wheezing on exam, no PFTs in the system.  No chest CT in the past, although lung fields on abdomen CT seems fairly normal.  Suspect this is hypopnea in the setting of anesthesia - Transfer to progressive - Wean o2 as able - Albuterol  PRN - Chest x-ray   Stage 3a chronic kidney disease (HCC) Creatinine stable within baseline range, up to 1.3 today - Okay to resume losartan  Lasix  tomorrow  Cerebrovascular disease Hypertension Blood pressure slightly up - Resume Lasix  and Cozaar  tomorrow - Continue Crestor  - Resume anticoagulation (warfarin with heparin  bridge) when cleared by general surgery  Gastro-esophageal reflux disease without esophagitis -Continue Protonix   Gout -Continue allopurinol   Hypothyroidism -Continue levothyroxine           Subjective: Patient is somnolent and sedated after surgery.  She is very hypoxic.  No fever     Physical Exam: BP (!) 110/54 (BP Location: Right Arm)   Pulse 83   Temp 97.6 F (36.4 C)   Resp 17   Ht 5\' 2"  (1.575 m)   Wt 78.2 kg   SpO2 94%   BMI 31.53 kg/m   Elderly adult female, lying bed, appears weak and  tired RRR, no murmurs, no peripheral edema Respiratory normal, but respiratory effort shallow, lungs clear without rales or wheezes Abdomen soft no tenderness palpation or guarding, no ascites or distention Sedated, generalized weakness   Data Reviewed: ABG reviewed, hypoxia, no respiratory acidosis Basic metabolic panel shows creatinine up to 1.3 today CBC normal INR 1.1  Family Communication:     Disposition: Status is: Inpatient         Author: Ephriam Hashimoto, MD 11/11/2023 5:59 PM  For on call review [email or web address].

## 2023-11-11 NOTE — Anesthesia Procedure Notes (Signed)
 Procedure Name: Intubation Date/Time: 11/11/2023 12:26 PM  Performed by: Raylene Calamity, CRNAPre-anesthesia Checklist: Patient identified, Emergency Drugs available, Suction available and Patient being monitored Patient Re-evaluated:Patient Re-evaluated prior to induction Oxygen Delivery Method: Circle System Utilized Preoxygenation: Pre-oxygenation with 100% oxygen Induction Type: IV induction Ventilation: Mask ventilation without difficulty Laryngoscope Size: Miller and 2 Grade View: Grade I Tube type: Oral Tube size: 7.0 mm Number of attempts: 1 Airway Equipment and Method: Stylet and Oral airway Placement Confirmation: ETT inserted through vocal cords under direct vision, positive ETCO2 and breath sounds checked- equal and bilateral Secured at: 20 cm Tube secured with: Tape Dental Injury: Teeth and Oropharynx as per pre-operative assessment

## 2023-11-11 NOTE — Transfer of Care (Signed)
 Immediate Anesthesia Transfer of Care Note  Patient: Elizabeth Morton  Procedure(s) Performed: REPAIR, HERNIA, VENTRAL, LAPAROSCOPIC,LYSIS OF ADHESIONS, BILATERAL TAP BLOCK  Patient Location: PACU  Anesthesia Type:General  Level of Consciousness: sedated and responds to stimulation  Airway & Oxygen Therapy: Patient Spontanous Breathing, Patient connected to nasal cannula oxygen, and Patient remains intubated per anesthesia plan  Post-op Assessment: Report given to RN and Post -op Vital signs reviewed and stable  Post vital signs: Reviewed and stable  Last Vitals:  Vitals Value Taken Time  BP 146/65 11/11/23 1449  Temp 36.8   Pulse 78 11/11/23 1450  Resp 14 11/11/23 1450  SpO2 93 % 11/11/23 1450  Vitals shown include unfiled device data.  Last Pain:  Vitals:   11/11/23 1030  TempSrc: Oral  PainSc:          Complications: No notable events documented.

## 2023-11-11 NOTE — Anesthesia Preprocedure Evaluation (Addendum)
 Anesthesia Evaluation  Patient identified by MRN, date of birth, ID band Patient awake    Reviewed: Allergy & Precautions, H&P , NPO status , Patient's Chart, lab work & pertinent test results  Airway Mallampati: II  TM Distance: >3 FB Neck ROM: Full    Dental no notable dental hx. (+) Upper Dentures, Edentulous Lower, Dental Advisory Given   Pulmonary former smoker   Pulmonary exam normal breath sounds clear to auscultation       Cardiovascular hypertension, Pt. on medications + CAD, + CABG and + Peripheral Vascular Disease   Rhythm:Regular Rate:Normal     Neuro/Psych CVA, No Residual Symptoms  negative psych ROS   GI/Hepatic Neg liver ROS, hiatal hernia,GERD  Medicated,,  Endo/Other  Hypothyroidism    Renal/GU Renal InsufficiencyRenal disease  negative genitourinary   Musculoskeletal   Abdominal   Peds  Hematology  (+) Blood dyscrasia, anemia   Anesthesia Other Findings   Reproductive/Obstetrics negative OB ROS                             Anesthesia Physical Anesthesia Plan  ASA: 3  Anesthesia Plan: General   Post-op Pain Management: Tylenol  PO (pre-op)*   Induction: Intravenous  PONV Risk Score and Plan: 4 or greater and Ondansetron , Dexamethasone  and Midazolam   Airway Management Planned: Oral ETT  Additional Equipment:   Intra-op Plan:   Post-operative Plan: Extubation in OR  Informed Consent: I have reviewed the patients History and Physical, chart, labs and discussed the procedure including the risks, benefits and alternatives for the proposed anesthesia with the patient or authorized representative who has indicated his/her understanding and acceptance.     Dental advisory given  Plan Discussed with: CRNA  Anesthesia Plan Comments:        Anesthesia Quick Evaluation

## 2023-11-12 ENCOUNTER — Encounter (HOSPITAL_COMMUNITY): Payer: Self-pay | Admitting: Surgery

## 2023-11-12 DIAGNOSIS — K432 Incisional hernia without obstruction or gangrene: Secondary | ICD-10-CM | POA: Diagnosis not present

## 2023-11-12 LAB — BASIC METABOLIC PANEL WITH GFR
Anion gap: 9 (ref 5–15)
BUN: 17 mg/dL (ref 8–23)
CO2: 21 mmol/L — ABNORMAL LOW (ref 22–32)
Calcium: 8.7 mg/dL — ABNORMAL LOW (ref 8.9–10.3)
Chloride: 105 mmol/L (ref 98–111)
Creatinine, Ser: 1.16 mg/dL — ABNORMAL HIGH (ref 0.44–1.00)
GFR, Estimated: 52 mL/min — ABNORMAL LOW (ref >60)
Glucose, Bld: 141 mg/dL — ABNORMAL HIGH (ref 70–99)
Potassium: 3.9 mmol/L (ref 3.5–5.1)
Sodium: 135 mmol/L (ref 135–145)

## 2023-11-12 LAB — CBC
HCT: 35.2 % — ABNORMAL LOW (ref 36.0–46.0)
Hemoglobin: 11.4 g/dL — ABNORMAL LOW (ref 12.0–15.0)
MCH: 29.8 pg (ref 26.0–34.0)
MCHC: 32.4 g/dL (ref 30.0–36.0)
MCV: 91.9 fL (ref 80.0–100.0)
Platelets: 213 10*3/uL (ref 150–400)
RBC: 3.83 MIL/uL — ABNORMAL LOW (ref 3.87–5.11)
RDW: 14.6 % (ref 11.5–15.5)
WBC: 8 10*3/uL (ref 4.0–10.5)
nRBC: 0 % (ref 0.0–0.2)

## 2023-11-12 LAB — PROTIME-INR
INR: 1.1 (ref 0.8–1.2)
Prothrombin Time: 14.1 s (ref 11.4–15.2)

## 2023-11-12 LAB — HEPARIN LEVEL (UNFRACTIONATED)
Heparin Unfractionated: 0.23 [IU]/mL — ABNORMAL LOW (ref 0.30–0.70)
Heparin Unfractionated: 0.61 [IU]/mL (ref 0.30–0.70)
Heparin Unfractionated: 0.72 [IU]/mL — ABNORMAL HIGH (ref 0.30–0.70)

## 2023-11-12 MED ORDER — GABAPENTIN 100 MG PO CAPS
200.0000 mg | ORAL_CAPSULE | Freq: Three times a day (TID) | ORAL | Status: DC
  Administered 2023-11-12 – 2023-11-16 (×13): 200 mg via ORAL
  Filled 2023-11-12 (×13): qty 2

## 2023-11-12 MED ORDER — OXYCODONE HCL 5 MG PO TABS
5.0000 mg | ORAL_TABLET | ORAL | Status: DC | PRN
Start: 2023-11-12 — End: 2023-11-16
  Administered 2023-11-12 – 2023-11-15 (×3): 10 mg via ORAL
  Filled 2023-11-12 (×3): qty 2

## 2023-11-12 MED ORDER — FUROSEMIDE 20 MG PO TABS
20.0000 mg | ORAL_TABLET | Freq: Every day | ORAL | Status: DC
  Administered 2023-11-13: 20 mg via ORAL
  Filled 2023-11-12 (×2): qty 1

## 2023-11-12 MED ORDER — FUROSEMIDE 10 MG/ML IJ SOLN
40.0000 mg | Freq: Once | INTRAMUSCULAR | Status: AC
  Administered 2023-11-12: 40 mg via INTRAVENOUS
  Filled 2023-11-12: qty 4

## 2023-11-12 MED ORDER — SODIUM CHLORIDE 0.9 % IV SOLN: 12.5000 mg | Freq: Four times a day (QID) | INTRAVENOUS | Status: DC | PRN

## 2023-11-12 MED ORDER — WARFARIN - PHARMACIST DOSING INPATIENT: Freq: Every day | Status: DC

## 2023-11-12 MED ORDER — WARFARIN SODIUM 3 MG PO TABS
3.0000 mg | ORAL_TABLET | Freq: Once | ORAL | Status: AC
  Administered 2023-11-12: 3 mg via ORAL
  Filled 2023-11-12: qty 1

## 2023-11-12 NOTE — Progress Notes (Signed)
 PHARMACY - ANTICOAGULATION CONSULT NOTE  Pharmacy Consult for IV heparin  Indication: History of mech aortic valve holding Coumadin  for surgery.  No Known Allergies  Patient Measurements: Height: 5\' 2"  (157.5 cm) Weight: 78.2 kg (172 lb 6.4 oz) IBW/kg (Calculated) : 50.1 HEPARIN  DW (KG): 67.3  Vital Signs: Temp: 97.6 F (36.4 C) (05/02 0510) Temp Source: Oral (05/02 0510) BP: 146/57 (05/02 0510) Pulse Rate: 83 (05/02 0510)  Labs: Recent Labs    11/09/23 1455 11/10/23 0547 11/10/23 0547 11/11/23 0549 11/12/23 0504  HGB  --  12.8   < > 13.8 11.4*  HCT  --  39.7  --  41.8 35.2*  PLT  --  236  --  249 213  LABPROT  --  14.7  --   --   --   INR  --  1.1  --   --   --   HEPARINUNFRC 0.50 0.51  --   --  0.23*  CREATININE  --  1.29*  --  1.36*  --    < > = values in this interval not displayed.    Estimated Creatinine Clearance: 39.4 mL/min (A) (by C-G formula based on SCr of 1.36 mg/dL (H)).   Medical History: Past Medical History:  Diagnosis Date   Aortic stenosis    Aortic valve replacement 2006   Bacteremia 01/19/2019   CAD (coronary artery disease)    a. s/p CABG 2006 at time of AVR.   CVA (cerebral infarction)    related to thrombosed aortic root aneurysm   Dyslipidemia    Dyspnea    Gallstones    GERD (gastroesophageal reflux disease)    Gout    Heart murmur    History of kidney stones    Hx of CABG    2006,LIMA to LAD, SVG to diagonal   Hx of transfusion of packed red blood cells    Hypertension    Hypothyroidism    Iron deficiency anemia    Lower GI bleed 06/2015   Pericardial effusion    Insignificant small pericardial effusion seen on echo, November, 2013   Peripheral vascular disease (HCC) 2006   blood clot -stroke   Pneumonia    S/P aortic valve replacement    a. Bentall procedure,#21 [employer] mechanical valve conduit with reimplantation of the coronaries 2006   Status post colonoscopy with polypectomy    "bleeding; colonoscopy was ~  06/18/2015"   Stroke [facility]) 2006   no deficits    Warfarin anticoagulation    coumadin  therapy    Medications:  Prior to admission medications include warfarin which is on hold for surgery; last warfarin dose 4/25 per Pharmacist (Cardiology) notes. PTA warfarin regimen per Coumadin  Clinic: 3 mg daily except 1.5 mg on Sundays Reported to have a history of bleed on Lovenox .   Assessment: Pharmacy consulted to manage IV heparin  for this 68 yo female who was on warfarin prior to admission (s/p aortic valve replacement) and warfarin is currently on hold for hernia repair surgery (planned 5/1 @1145 ).   11/12/2023 HL 0.23 subtherapeutic on 850 units/hr Hgb 11.4 Plts 213 No s/sx of bleeding reported  Goal of Therapy:  Heparin  level 0.3-0.7 units/ml Monitor platelets by anticoagulation protocol: Yes   Plan:  Increase heparin  drip to 1000 units/hr Check confirmatory heparin  level in 6 hours Monitor daily heparin  level, CBC, signs/symptoms of bleeding   Thank you for allowing pharmacy to be a part of this patient's care. Beau Bound RPh 11/12/2023, 6:09 AM

## 2023-11-12 NOTE — Assessment & Plan Note (Signed)
 Continue Levothyroxine.

## 2023-11-12 NOTE — Progress Notes (Signed)
 11/12/2023  Elizabeth Morton [id number] Jul 29, 1955  CARE TEAM: PCP: Azalia Leo, MD  Outpatient Care Team: Patient Care Team: Azalia Leo, MD as PCP - General (Internal Medicine) Candyce Champagne, MD as Attending Physician (General Surgery) Elmyra Haggard, MD as Consulting Physician (Cardiology) Armbruster, Lendon Queen, MD as Consulting Physician (Gastroenterology)  Inpatient Treatment Team: Treatment Team:  Ephriam Hashimoto, MD Candyce Champagne, MD Verlin Glory, MD Kelby Patches, RN Silvia Drummer, RN Berwyn Broker, NT   Problem List:   Principal Problem:   Recurrent ventral hernia Active Problems:   Warfarin anticoagulation   Recurrent incisional hernia with incarceration   Hypertension   Hx of CABG   History of CVA (cerebrovascular accident)   S/P repair of ventral hernia Nov 2017   Gastro-esophageal reflux disease without esophagitis   Gout   Obesity   Stage 3a chronic kidney disease (HCC)   Long term current use of anticoagulant therapy   11/11/2023  POST-OPERATIVE DIAGNOSIS:  VENTRAL INCISIONAL RECURRENT AND INCARCERATED  Dimensions of hernia post-op:  18cm x 7cm   PROCEDURE:   LAPAROSCOPIC REPAIR OF ABDOMINAL HERNIA WITH MESH  (See OR Findings below) LAPAROSCOPIC LYSIS OF ADHESIONS REMOVAL OF OLD MESH  TAP BLOCK - BILATERAL   SURGEON:  Eddye Goodie, MD  OR FINDINGS: Umbilical supraumbilical recurrent incisional hernias with old disc of mesh partially herniated into the squishy hernia sac.  Incarcerated the large volume omentum and colon nearby.  Swiss cheese hernias within significant diastases recti with a dominant Subxiphoid hernia 5 x 4 cm containing falciform ligament and omentum. .   Type of ventral wall repair:  Laparoscopic underlay repair .with Primary repair of largest hernia Placement of mesh: Centrally intraperitoneal with edges tucked into RECTRORECTUS & preperitoneal space Name of mesh: Bard Ventralight dual sided (polypropylene /  Seprafilm) Size of mesh: 33x27cm Orientation: Vertical Mesh overlap:  7cm  Assessment [facility] Stay = 4 days) 1 Day Post-Op    Recovering relatively well    Plan:  Soreness and pain not surprising given the need to remove old mesh and repair larger region of multiple hernias.  Continue scheduled Tylenol .  Add scheduled gabapentin .  Flexeril as needed.  Switch from tramadol  oxycodone  PRN (as needed) with IV backup.  Continue abdominal binder.  Will ask therapies to come by and help evaluate to help her transition out of bed and make sure she does not need rehab or at least home health care.  Tolerating liquids.  Advance diet.  Anticoagulation for her heart valve.  Defer to medicine.  Okay to start warfarin in the meantime and follow INRs.  Most likely can go home when INR is normalized and cleared by internal medicine since she has had numerous severe readmissions on Lovenox  bridges, hence the need for preadmission and postop monitoring. -monitor electrolytes & replace as needed  Keep K>4, Mg>2, Phos>3  I updated the patient's status to the patient  Recommendations were made.  Questions were answered.  She expressed understanding & appreciation.  -Disposition:  Disposition:  The patient is from: Home Anticipate discharge to:  Home with Home Health Anticipated Date of Discharge is:  May 5,2025   Barriers to discharge:  Pending Clinical improvement (more likely than not), Therapy assessment & Recommendations pending, and Testing result pending  Patient currently is NOT MEDICALLY STABLE for discharge from the hospital from a surgery standpoint.      I reviewed nursing notes, hospitalist notes, last 24 h vitals and pain scores, last  48 h intake and output, last 24 h labs and trends, and last 24 h imaging results.  I have reviewed this patient's available data, including medical history, events of note, test results, etc as part of my evaluation.   A significant  portion of that time was spent in counseling. Care during the described time interval was provided by me.  This care required moderate level of medical decision making.  11/12/2023    Subjective: (Chief complaint)  Transferred telemetry.  On oxygen.  Still with pain and soreness but not asking for anything beyond scheduled Tylenol .  Tolerating liquids.  Was nauseated last night but feels better.  Objective:  Vital signs:  Vitals:   11/11/23 2027 11/12/23 0023 11/12/23 0036 11/12/23 0510  BP: 134/65 (!) 154/64  (!) 146/57  Pulse: 86 84 85 83  Resp:  20  20  Temp: 98.4 F (36.9 C) 98.8 F (37.1 C)  97.6 F (36.4 C)  TempSrc: Oral Axillary  Oral  SpO2: 95% 100% 95% 92%  Weight:      Height:        Last BM Date : 11/08/23  Intake/Output   Yesterday:  05/01 0701 - 05/02 0700 In: 1253 [I.V.:803; IV Piggyback:450] Out: 525 [Urine:475; Blood:50] This shift:  No intake/output data recorded.  Bowel function:  Flatus: YES  BM:  No  Drain: (No drain)   Physical Exam:  General: Pt awake/alert in no acute distress Eyes: PERRL, normal EOM.  Sclera clear.  No icterus Neuro: CN II-XII intact w/o focal sensory/motor deficits. Lymph: No head/neck/groin lymphadenopathy Psych:  No delerium/psychosis/paranoia.  Oriented x 4 HENT: Normocephalic, Mucus membranes moist.  No thrush Neck: Supple, No tracheal deviation.  No obvious thyromegaly Chest: No pain to chest wall compression.  Good respiratory excursion.  No audible wheezing CV:  Pulses intact.  Regular rhythm.  No major extremity edema MS: Normal AROM mjr joints.  No obvious deformity  Abdomen: Soft.  Nondistended.  Mildly tender at incisions only.  No evidence of peritonitis.  No incarcerated hernias.  Ext:   No deformity.  No mjr edema.  No cyanosis Skin: No petechiae / purpurea.  No major sores.  Warm and dry    Results:   Cultures: No results found for this or any previous visit (from the past 720  hours).  Labs: Results for orders placed or performed during the hospital encounter of 11/08/23 (from the past 48 hours)  CBC     Status: None   Collection Time: 11/11/23  5:49 AM  Result Value Ref Range   WBC 6.6 4.0 - 10.5 K/uL   RBC 4.60 3.87 - 5.11 MIL/uL   Hemoglobin 13.8 12.0 - 15.0 g/dL   HCT 54.0 98.1 - 19.1 %   MCV 90.9 80.0 - 100.0 fL   MCH 30.0 26.0 - 34.0 pg   MCHC 33.0 30.0 - 36.0 g/dL   RDW 47.8 29.5 - 62.1 %   Platelets 249 150 - 400 K/uL   nRBC 0.0 0.0 - 0.2 %    Comment: Performed at [facility], [address] [place], [place] [address]  Basic metabolic panel with GFR     Status: Abnormal   Collection Time: 11/11/23  5:49 AM  Result Value Ref Range   Sodium 135 135 - 145 mmol/L   Potassium 4.1 3.5 - 5.1 mmol/L    Comment: HEMOLYSIS AT THIS LEVEL MAY AFFECT RESULT   Chloride 101 98 - 111 mmol/L   CO2 22  22 - 32 mmol/L   Glucose, Bld 96 70 - 99 mg/dL    Comment: Glucose reference range applies only to samples taken after fasting for at least 8 hours.   BUN 19 8 - 23 mg/dL   Creatinine, Ser 1.61 (H) 0.44 - 1.00 mg/dL   Calcium  9.4 8.9 - 10.3 mg/dL   GFR, Estimated 43 (L) >60 mL/min    Comment: (NOTE) Calculated using the CKD-EPI Creatinine Equation (2021)    Anion gap 12 5 - 15    Comment: Performed at [facility], [address] [place], [place] [address]  Blood gas, arterial     Status: Abnormal   Collection Time: 11/11/23  5:07 PM  Result Value Ref Range   FIO2 15 %   O2 Content 95.0 L/min   Delivery systems NON-REBREATHER OXYGEN MASK    pH, Arterial 7.35 7.35 - 7.45   pCO2 arterial 38 32 - 48 mmHg   pO2, Arterial 71 (L) 83 - 108 mmHg   Bicarbonate 21.6 20.0 - 28.0 mmol/L   Acid-base deficit 3.9 (H) 0.0 - 2.0 mmol/L   O2 Saturation 98.7 %   Patient temperature 36.0    Collection site LEFT BRACHIAL    Drawn by Edwina Gram RT    Allens test (pass/fail) PASS PASS    Comment: Performed at [facility], [address], [place], [place] [address]  CBC     Status: Abnormal   Collection Time: 11/12/23  5:04 AM  Result Value Ref Range   WBC 8.0 4.0 - 10.5 K/uL   RBC 3.83 (L) 3.87 - 5.11 MIL/uL   Hemoglobin 11.4 (L) 12.0 - 15.0 g/dL   HCT 11.9 (L) 14.7 - 82.9 %   MCV 91.9 80.0 - 100.0 fL   MCH 29.8 26.0 - 34.0 pg   MCHC 32.4 30.0 - 36.0 g/dL   RDW 56.2 13.0 - 86.5 %   Platelets 213 150 - 400 K/uL   nRBC 0.0 0.0 - 0.2 %    Comment: Performed at [facility], [address] [place], [place] [address]  Basic metabolic panel with GFR     Status: Abnormal   Collection Time: 11/12/23  5:04 AM  Result Value Ref Range   Sodium 135 135 - 145 mmol/L   Potassium 3.9 3.5 - 5.1 mmol/L   Chloride 105 98 - 111 mmol/L   CO2 21 (L) 22 - 32 mmol/L   Glucose, Bld 141 (H) 70 - 99 mg/dL    Comment: Glucose reference range applies only to samples taken after fasting for at least 8 hours.   BUN 17 8 - 23 mg/dL   Creatinine, Ser 6.29 (H) 0.44 - 1.00 mg/dL   Calcium  8.7 (L) 8.9 - 10.3 mg/dL   GFR, Estimated 52 (L) >60 mL/min    Comment: (NOTE) Calculated using the CKD-EPI Creatinine Equation (2021)    Anion gap 9 5 - 15    Comment: Performed at [facility], [address] [place] [place], [place] [address]  Heparin  level (unfractionated)     Status: Abnormal   Collection Time: 11/12/23  5:04 AM  Result Value Ref Range   Heparin  Unfractionated 0.23 (L) 0.30 - 0.70 IU/mL    Comment: (NOTE) The clinical reportable range upper limit is being lowered to >1.10 to align with the FDA approved guidance for the current laboratory assay.  If heparin  results are below expected values, and patient dosage has  been confirmed, suggest  follow up testing of antithrombin III levels. Performed at [facility], [address] [place], [place] [address]     Imaging / Studies: DG CHEST PORT 1 VIEW Result Date: 11/11/2023 EXAM: 1 VIEW XRAY OF THE CHEST  11/11/2023 06:13:00 PM COMPARISON: 01/17/2019. CLINICAL HISTORY: 200808 Hypoxia [id number]. Reason for exam: hypoxia FINDINGS: LUNGS AND PLEURA: Low lung volumes with suspected small bilateral pleural effusions. HEART AND MEDIASTINUM: Prosthetic valve. Postsurgical changes related to prior CABG. Median sternotomy. BONES AND SOFT TISSUES: No acute osseous abnormality. IMPRESSION: 1. Suspected small bilateral pleural effusions. Electronically signed by: Zadie Herter MD 11/11/2023 08:33 PM EDT RP Workstation: [id number]    Medications / Allergies: per chart  Antibiotics: Anti-infectives (From admission, onward)    Start     Dose/Rate Route Frequency Ordered Stop   11/11/23 2200  ceFAZolin  (ANCEF ) IVPB 2g/100 mL premix        2 g 200 mL/hr over 30 Minutes Intravenous Every 8 hours 11/11/23 1930 11/12/23 0612   11/11/23 1045  ceFAZolin  (ANCEF ) IVPB 2g/100 mL premix        2 g 200 mL/hr over 30 Minutes Intravenous On call to O.R. 11/09/23 0748 11/11/23 1021   11/11/23 1033  ceFAZolin  (ANCEF ) 2-4 GM/100ML-% IVPB       Note to Pharmacy: Aloha Arnold D: cabinet override      11/11/23 1033 11/11/23 2244         Note: Portions of this report may have been transcribed using voice recognition software. Every effort was made to ensure accuracy; however, inadvertent computerized transcription errors may be present.   Any transcriptional errors that result from this process are unintentional.    Eddye Goodie, MD, FACS, MASCRS Esophageal, Gastrointestinal & Colorectal Surgery Robotic and Minimally Invasive Surgery  [facility] [address], [address] [place], [place] [address] [phone] Fax [phone] Main  CONTACT INFORMATION: Weekday (9AM-5PM): Call [facility] main office at [phone] Weeknight (5PM-9AM) or Weekend/Holiday: Check EPIC "Web Links" tab & use "AMION" (password " [email or web address]") for General Surgery [facility] coverage  Please,  DO NOT use SecureChat  (it is not reliable communication to reach operating surgeons & will lead to a delay in care).   Epic staff messaging available for outptient concerns needing 1-2 business day response.      11/12/2023  7:07 AM

## 2023-11-12 NOTE — Plan of Care (Signed)

## 2023-11-12 NOTE — Progress Notes (Signed)
 PHARMACY - ANTICOAGULATION CONSULT NOTE  Pharmacy Consult for IV heparin  + warfarin Indication: History of mech aortic valve holding Coumadin  for surgery.  No Known Allergies  Patient Measurements: Height: 5\' 2"  (157.5 cm) Weight: 78.2 kg (172 lb 6.4 oz) IBW/kg (Calculated) : 50.1 HEPARIN  DW (KG): 67.3  Vital Signs: Temp: 98.5 F (36.9 C) (05/02 0846) Temp Source: Oral (05/02 0846) BP: 136/52 (05/02 0846) Pulse Rate: 84 (05/02 0846)  Labs: Recent Labs    11/10/23 0547 11/11/23 0549 11/12/23 0504 11/12/23 1213  HGB 12.8 13.8 11.4*  --   HCT 39.7 41.8 35.2*  --   PLT 236 249 213  --   LABPROT 14.7  --  14.1  --   INR 1.1  --  1.1  --   HEPARINUNFRC 0.51  --  0.23* 0.61  CREATININE 1.29* 1.36* 1.16*  --     Estimated Creatinine Clearance: 46.2 mL/min (A) (by C-G formula based on SCr of 1.16 mg/dL (H)).  Medications:  Medications Prior to Admission  Medication Sig Dispense Refill   acetaminophen  (TYLENOL ) 500 MG tablet Take 500-1,000 mg by mouth every 6 (six) hours as needed for headache, moderate pain (pain score 4-6) or mild pain (pain score 1-3) (Sleep).     allopurinol  (ZYLOPRIM ) 100 MG tablet Take 100 mg by mouth daily.     Calcium  Carbonate-Vitamin D 600-400 MG-UNIT tablet Take 1 tablet by mouth 2 (two) times daily.     colchicine  0.6 MG tablet Take 1 tablet (0.6 mg total) by mouth daily for 3 doses. Take 2 tablets right away, take the 2nd dose 1.5 hours later (Patient taking differently: Take 0.6 mg by mouth See admin instructions.  For flare-up Take 2 tablets right away, take the 2nd dose 1.5 hours later) 3 tablet 0   furosemide  (LASIX ) 20 MG tablet Take 20 mg by mouth daily.  5   levothyroxine  (SYNTHROID ) 75 MCG tablet Take 75 mcg by mouth daily before breakfast.     losartan  (COZAAR ) 25 MG tablet Take 50 mg by mouth daily.     Multiple Vitamins-Minerals (PRESERVISION/LUTEIN PO) Take 1 capsule by mouth daily with breakfast.      naphazoline-glycerin (CLEAR EYES  REDNESS) 0.012-0.25 % SOLN Place 1-2 drops into both eyes 4 (four) times daily as needed for eye irritation.     pantoprazole  (PROTONIX ) 40 MG tablet Take 40 mg by mouth daily.     rosuvastatin  (CRESTOR ) 20 MG tablet Take 20 mg by mouth daily.     warfarin (COUMADIN ) 3 MG tablet TAKE 1 TABLET DAILY EXCEPT 1/2 TABLET ON SUNDAYS OR AS DIRECTED BY ANTICOAGULATION CLINIC. 90 tablet 1    Assessment: Pharmacy consulted to manage IV heparin  for this 68 yo female who was on warfarin prior to admission for a mechanical aortic valve. Warfarin held for hernia repair surgery, completed 5/1. Resuming warfarin per Pharmacy on 5/2.  Baseline INR, aPTT: mildly elevated but subtherapeutic Prior anticoagulation: warfarin 3 mg daily, except 1.5 mg qSunday; last dose 4/25 Reported to have a history of bleeding on Lovenox   Significant events:  Today, 11/12/2023: CBC: Hgb slightly low postop as expected; Plt stable WNL Most recent heparin  level therapeutic on 1000 units/hr No bleeding or infusion issues per nursing Major drug interactions: no major interactions with warfarin noted Eating 25% of meals (had been 100% pre-op) SCr still elevated but improved from prior  Goal of Therapy: Heparin  level 0.3-0.7 units/ml INR 2-3 (confirmed in [facility] clinic documentation) Monitor platelets by anticoagulation protocol: Yes  Plan: Continue heparin  IV infusion at 1000 units/hr Recheck confirmatory heparin  level in 6 hrs Warfarin 3 mg PO tonight at 18:00 Daily INR, CBC; daily heparin  level once stable Monitor for signs of bleeding or thrombosis Recommended duration of heparin -warfarin overlap is 5 days AND until at least 2 consecutive therapeutic INRs achieved   Tera Fellows, PharmD, BCPS [phone] 11/12/2023, 2:44 PM

## 2023-11-12 NOTE — Assessment & Plan Note (Signed)
 S/p laparoscopic repair of ventral hernia with mesh 5/1 by Dr. Hershell Lose - Postop care per general surgery - Heparin  resumed by Surgery last night

## 2023-11-12 NOTE — Progress Notes (Signed)
 Mobility Specialist - Progress Note  ([place] 3L) Pre-mobility: 91% SpO2 During mobility: 85% SpO2 Post-mobility: 91% SPO2   11/12/23 1021  Mobility  Activity Transferred to/from [facility]  Level of Assistance Contact guard assist, steadying assist  Assistive Device Front wheel walker;BSC  Range of Motion/Exercises Active  Activity Response Tolerated fair  Mobility Referral Yes  Mobility visit 1 Mobility  Mobility Specialist Start Time (ACUTE ONLY) 1008  Mobility Specialist Stop Time (ACUTE ONLY) 1021  Mobility Specialist Time Calculation (min) (ACUTE ONLY) 13 min   Pt was found on recliner chair and agreeable to mobilize. Pt stated feeling nauseous with standing and needing to void urine. Assisted to [facility] and SPO2 dropped to 85%. Able to increase within 1 min. Afterwards returned to recliner chair with all needs met. SPO2 maintained >90% during second transfer. Was left with call bell in reach and RN notified of session.   Lorna Rose Mobility Specialist

## 2023-11-12 NOTE — Assessment & Plan Note (Signed)
 BMI 33, complicates care, class I obesity

## 2023-11-12 NOTE — TOC Progression Note (Signed)
 Transition of Care [facility]) - Progression Note    Patient Details  Name: Elizabeth Morton MRN: [id number] Date of Birth: [birth year]  [facility] CM/SW Contact  Rayen Dafoe, Thersia Flax, RN Phone Number: 11/12/2023, 12:26 PM  Clinical Narrative: On 02-monitor if needed @ home. Await PT recc.      Expected Discharge Plan: Home/Self Care Barriers to Discharge: Continued Medical Work up  Expected Discharge Plan and Services                                               Social Determinants of Health (SDOH) Interventions SDOH Screenings   Food Insecurity: No Food Insecurity (11/08/2023)  Housing: Low Risk  (11/08/2023)  Transportation Needs: No Transportation Needs (11/08/2023)  Utilities: Not At Risk (11/08/2023)  Depression (PHQ2-9): Low Risk  (02/09/2019)  Social Connections: Moderately Integrated (11/09/2023)  Tobacco Use: Medium Risk (11/09/2023)    Readmission Risk Interventions    11/09/2023   11:07 AM  Readmission Risk Prevention Plan  Post Dischage Appt Complete  Medication Screening Complete  Transportation Screening Complete

## 2023-11-12 NOTE — Progress Notes (Signed)
  Progress Note   Patient: Elizabeth Morton NWG:[id number] DOB: [birth year] DOA: 11/08/2023     4 DOS: the patient was seen and examined on 11/12/2023 at 10:41AM      Brief hospital course: 68 y.o. F with hx CVA due to aortic aneurysm, on warfarin, hx mech aortic valve after stroke, CAD s/p CABG, and hypothyroidism who was admitted for heparin  bridge prior to elective hernia repair.    S/p laparoscopic hernia repair on 5/1, post op course complicated by hypoxia.     Assessment and Plan: * Recurrent ventral hernia S/p laparoscopic repair of ventral hernia with mesh 5/1 by Dr. Hershell Lose - Postop care per general surgery - Heparin  resumed by Surgery last night    Acute respiratory failure with hypoxia (HCC) ABG without hypercarbia.  CXR with maybe small effusions.  NO airspace disease or opoacity.  No history COPD or bronchoconstriction.  Doubt PE.  Maybe some fluid overload in setting of heart disease, but mostly this is shallow inspiration in setting of OHS and abdominal pain. - IS - Pain control - Lasix  x1 - Wean O2 as able   Stage 3a chronic kidney disease (HCC) Cr stable relative to baseline - IV Lasix  once today - Continue losartan  - Resume home oral Lasix  tomorrow - Trend Cr  Hypertension Cerebrovascular disease Longterm use of warfarin anticoagulation Had thrombosis of her aortic root aneurysm many years ago, developed an embolic stroke. Has been on warfarin since then. Aortic root subsequently repaired, has mechanical aortic valve and CABG.  - Continue heparin  gtt - Resume warfarin - Continue Losartan  and Lasix  - Continue Crestor   Gastro-esophageal reflux disease without esophagitis - Continue Protonix   Gout - Continue allopurinol   History of CVA (cerebrovascular accident) - Continue statin  Obesity BMI 33, complicates care, class I obesity  Hypothyroidism - Continue Levothyroxine           Subjective: Sitting up in chair, appearing comfortable,  reports abdominal soreness, nausea, vomiting, stable since postop.  No fever, no confusion.     Physical Exam: BP (!) 136/52 (BP Location: Right Arm)   Pulse 84   Temp 98.5 F (36.9 C) (Oral)   Resp 18   Ht 5\' 2"  (1.575 m)   Wt 78.2 kg   SpO2 92%   BMI 31.53 kg/m   Obese adult female, sitting up in recliner, interactive and appropriate Appears uncomfortable RRR, no murmurs, no peripheral edema Respiratory effort extremely shallow, lungs with minimal air movement due to poor inspiratory effort, no wheezing or rales appreciated Abdomen with binder on, diffuse discomfort to palpation Attention normal, affect appropriate, judgment and insight appear normal    Data Reviewed: ABG shows low oxygen, normal CO2 Chest x-ray shows minimal effusions, no airspace disease Creatinine stable at 1.1 Hemoglobin slightly down to 11, white blood cell count normal  Family Communication:     Disposition: Status is: Inpatient         Author: Ephriam Hashimoto, MD 11/12/2023 12:26 PM  For on call review [email or web address].

## 2023-11-12 NOTE — Progress Notes (Signed)
 Pharmacy Brief Note regarding IV heparin  continuous infusion:   Pt is a 43 yoF currently on heparin  infusion bridge for patient who had warfarin held for surgery. Surgery completed yesterday and warfarin resumed today.  For full history/progress note, see note by Tera Fellows, PharmD from earlier today.   Assessment: 19:22 Heparin  level = 0.72, supra-therapeutic, with IV heparin  infusing at 1000 units/hr No bleeding or heparin  related issues per nurse  Note: On 4/29, 01:14 heparin  level was supra-therapeutic with IV heparin  infusing at 950 units/hr   Goal:  HL 0.3 - 0.7   Plan: Decrease IV heparin  to 900 units/hr 6 hour Heparin  level following rate decrease Monitor daily heparin  level once stable, INR for warfarin dose, CBC, signs/symptoms of bleeding   Thank you for allowing pharmacy to be a part of this patient's care.  Alfredo Inch, PharmD, BCPS Clinical Pharmacist [facility] 11/12/2023 8:31 PM

## 2023-11-12 NOTE — Assessment & Plan Note (Signed)
 Cr stable relative to baseline - IV Lasix  once today - Continue losartan  - Resume home oral Lasix  tomorrow - Trend Cr

## 2023-11-12 NOTE — Assessment & Plan Note (Signed)
 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 Has been on warfarin since then. Aortic root subsequently repaired, has mechanical aortic valve and CABG.  - Continue heparin  gtt - Resume warfarin - Continue Losartan  and Lasix  - Continue Crestor 

## 2023-11-12 NOTE — Assessment & Plan Note (Signed)
 Postop she is very hypoxic, requiring nonrebreather.  Lung sounds are clear, respiratory rate seems normal.  ABG shows hypoxia, no hypercarbia, pH normal.   No prior history of COPD, no wheezing on exam, no PFTs in the system.  No chest CT in the past, although lung fields on abdomen CT seems fairly normal.  Suspect this is hypopnea in the setting of anesthesia - Transfer to progressive - Wean o2 as able - Albuterol  PRN - Chest x-ray

## 2023-11-13 DIAGNOSIS — K432 Incisional hernia without obstruction or gangrene: Secondary | ICD-10-CM | POA: Diagnosis not present

## 2023-11-13 LAB — CBC
HCT: 37 % (ref 36.0–46.0)
Hemoglobin: 11.8 g/dL — ABNORMAL LOW (ref 12.0–15.0)
MCH: 30 pg (ref 26.0–34.0)
MCHC: 31.9 g/dL (ref 30.0–36.0)
MCV: 94.1 fL (ref 80.0–100.0)
Platelets: 228 10*3/uL (ref 150–400)
RBC: 3.93 MIL/uL (ref 3.87–5.11)
RDW: 14.6 % (ref 11.5–15.5)
WBC: 8.8 10*3/uL (ref 4.0–10.5)
nRBC: 0 % (ref 0.0–0.2)

## 2023-11-13 LAB — BASIC METABOLIC PANEL WITH GFR
Anion gap: 8 (ref 5–15)
BUN: 20 mg/dL (ref 8–23)
CO2: 26 mmol/L (ref 22–32)
Calcium: 9.3 mg/dL (ref 8.9–10.3)
Chloride: 101 mmol/L (ref 98–111)
Creatinine, Ser: 1.42 mg/dL — ABNORMAL HIGH (ref 0.44–1.00)
GFR, Estimated: 41 mL/min — ABNORMAL LOW (ref >60)
Glucose, Bld: 118 mg/dL — ABNORMAL HIGH (ref 70–99)
Potassium: 4.1 mmol/L (ref 3.5–5.1)
Sodium: 135 mmol/L (ref 135–145)

## 2023-11-13 LAB — PROTIME-INR
INR: 1.2 (ref 0.8–1.2)
Prothrombin Time: 15.5 s — ABNORMAL HIGH (ref 11.4–15.2)

## 2023-11-13 LAB — HEPARIN LEVEL (UNFRACTIONATED)
Heparin Unfractionated: 0.42 [IU]/mL (ref 0.30–0.70)
Heparin Unfractionated: 0.35 [IU]/mL (ref 0.30–0.70)

## 2023-11-13 MED ORDER — WARFARIN SODIUM 3 MG PO TABS
3.0000 mg | ORAL_TABLET | Freq: Once | ORAL | Status: AC
  Administered 2023-11-13: 3 mg via ORAL
  Filled 2023-11-13: qty 1

## 2023-11-13 MED ORDER — DOCUSATE SODIUM 100 MG PO CAPS
100.0000 mg | ORAL_CAPSULE | Freq: Two times a day (BID) | ORAL | Status: DC
  Administered 2023-11-13 – 2023-11-14 (×4): 100 mg via ORAL
  Filled 2023-11-13 (×3): qty 1

## 2023-11-13 MED ORDER — POLYETHYLENE GLYCOL 3350 17 G PO PACK
17.0000 g | PACK | Freq: Every day | ORAL | Status: DC | PRN
Start: 2023-11-13 — End: 2023-11-15

## 2023-11-13 NOTE — Evaluation (Addendum)
 Physical Therapy Evaluation Patient Details Name: Elizabeth Morton MRN: [id number] DOB: 03-01-1956 Today's Date: 11/13/2023  History of Present Illness  Elizabeth Morton is a 68 y.o. female admitted to [facility] for heparin  bridge prior to elective hernia repair. S/p laparoscopic hernia repair on 5/1, post op course complicated by hypoxia.  PMH:CVA due to aortic aneurysm, on warfarin, hx mech aortic valve after stroke, CAD s/p CABG, and hypothyroidism  Clinical Impression  Pt admitted with above diagnosis.  Pt currently with functional limitations due to the deficits listed below (see PT Problem List). Pt will benefit from acute skilled PT to increase their independence and safety with mobility to allow discharge.  Pt reports she was independent from home with spouse prior to admission.  Pt assisted with ambulating in hallway however distance limited by generalized weakness and dyspnea. Pt requiring supplemental oxygen at this time.  Pt hopeful to return home without O2 or need for RW.  Pt encouraged to perform OOB activities (such as BSC or ambulate to bathroom since she is currently using purewick).  Pt would also benefit from ambulating in hallway at least twice a day with staff.  Will request mobility specialist to work with pt as well.  Pt states she anticipates d/c home on Monday.     SATURATION QUALIFICATIONS: (This note is used to comply with regulatory documentation for home oxygen)  Patient Saturations on 3L at Rest = 91%  Patient Saturations on 3L while Ambulating = 86%  Patient Saturations on 4 Liters of oxygen while Ambulating = 94%  Please briefly explain why patient needs home oxygen: to improve oxygen saturations during physical activities such as ADLs and ambulation    If plan is discharge home, recommend the following: A little help with walking and/or transfers;A little help with bathing/dressing/bathroom;Help with stairs or ramp for entrance;Assistance with cooking/housework   Can  travel by private vehicle        Equipment Recommendations Rolling walker (2 wheels)  Recommendations for Other Services       Functional Status Assessment Patient has had a recent decline in their functional status and demonstrates the ability to make significant improvements in function in a reasonable and predictable amount of time.     Precautions / Restrictions Precautions Precautions: Fall Precaution/Restrictions Comments: monitor sats      Mobility  Bed Mobility Overal bed mobility: Needs Assistance Bed Mobility: Supine to Sit     Supine to sit: Contact guard, HOB elevated, Used rails     General bed mobility comments: provided a hand for pt to self assist trunk upright    Transfers Overall transfer level: Needs assistance Equipment used: Rolling walker (2 wheels) Transfers: Sit to/from Stand Sit to Stand: Contact guard assist           General transfer comment: verbal cues for hand placement    Ambulation/Gait Ambulation/Gait assistance: Contact guard assist Gait Distance (Feet): 80 Feet Assistive device: Rolling walker (2 wheels) Gait Pattern/deviations: Step-through pattern, Decreased stride length, Trunk flexed       General Gait Details: cues for posture, RW positioning, distance limited by dyspnea; SpO2 dropped to 86% on 3L O2 [place] so increased to 4L for 94% with ambulating back to room  Stairs            Wheelchair Mobility     Tilt Bed    Modified Rankin (Stroke Patients Only)       Balance Overall balance assessment: Needs assistance  Standing balance support: Bilateral upper extremity supported, During functional activity, Reliant on assistive device for balance   Standing balance comment: reliant on UE support with RW for ambulation, static is fair                             Pertinent Vitals/Pain Pain Assessment Pain Assessment: No/denies pain    Home Living Family/patient expects to be  discharged to:: Private residence Living Arrangements: Spouse/significant other Available Help at Discharge: Family Type of Home: House Home Access: Stairs to enter       Home Layout: One level Home Equipment: None      Prior Function Prior Level of Function : Independent/Modified Independent                     Extremity/Trunk Assessment        Lower Extremity Assessment Lower Extremity Assessment: Generalized weakness       Communication   Communication Communication: No apparent difficulties    Cognition Arousal: Alert Behavior During Therapy: WFL for tasks assessed/performed   PT - Cognitive impairments: No apparent impairments                         Following commands: Intact       Cueing       General Comments      Exercises     Assessment/Plan    PT Assessment Patient needs continued PT services  PT Problem List Decreased activity tolerance;Decreased mobility;Decreased knowledge of use of DME;Decreased strength;Cardiopulmonary status limiting activity       PT Treatment Interventions Gait training;DME instruction;Balance training;Functional mobility training;Therapeutic activities;Therapeutic exercise;Patient/family education    PT Goals (Current goals can be found in the Care Plan section)  Acute Rehab PT Goals PT Goal Formulation: With patient Time For Goal Achievement: 11/27/23 Potential to Achieve Goals: Good    Frequency Min 3X/week     Co-evaluation               AM-PAC PT "6 Clicks" Mobility  Outcome Measure Help needed turning from your back to your side while in a flat bed without using bedrails?: A Little Help needed moving from lying on your back to sitting on the side of a flat bed without using bedrails?: A Little Help needed moving to and from a bed to a chair (including a wheelchair)?: A Little Help needed standing up from a chair using your arms (e.g., wheelchair or bedside chair)?: A  Little Help needed to walk in hospital room?: A Little Help needed climbing 3-5 steps with a railing? : A Little 6 Click Score: 18    End of Session Equipment Utilized During Treatment: Gait belt;Oxygen Activity Tolerance: Patient tolerated treatment well Patient left: in chair;with call bell/phone within reach;with chair alarm set Nurse Communication: Mobility status PT Visit Diagnosis: Difficulty in walking, not elsewhere classified (R26.2);Muscle weakness (generalized) (M62.81)    Time: 1035-1100 PT Time Calculation (min) (ACUTE ONLY): 25 min   Charges:   PT Evaluation $PT Eval Low Complexity: 1 Low PT Treatments $Gait Training: 8-22 mins PT General Charges $$ ACUTE PT VISIT: 1 Visit        Henretta Lodge PT, DPT Physical Therapist Acute Rehabilitation Services Office: [phone]   Myna Asal Payson 11/13/2023, 12:28 PM

## 2023-11-13 NOTE — Evaluation (Signed)
 Occupational Therapy Evaluation Patient Details Name: Elizabeth Morton MRN: [id number] DOB: [birth year] Today's Date: 11/13/2023   History of Present Illness   Elizabeth Morton is a 68 y.o. F admitted to [facility] for heparin  bridge prior to elective hernia repair. S/p laparoscopic hernia repair on 5/1, post op course complicated by hypoxia.  PMH:CVA due to aortic aneurysm, on warfarin, hx mech aortic valve after stroke, CAD s/p CABG, and hypothyroidism     Clinical Impressions PTA, patient was living with husband at home independently. Currently, presents with deficits outlined below (see OT Problem list for details) most significantly abdominal binder with precautions, increased body habitus with functional reach deficits, decreased activity tolerance and balance skills impacting BADL's and functional mobility. OT recommending home with [facility] services with family/caregiver assistance and support. Will follow acutely to progress function and safety.      If plan is discharge home, recommend the following:   A little help with walking and/or transfers;A lot of help with bathing/dressing/bathroom;Assistance with cooking/housework;Direct supervision/assist for medications management;Direct supervision/assist for financial management;Assist for transportation;Help with stairs or ramp for entrance     Functional Status Assessment   Patient has had a recent decline in their functional status and demonstrates the ability to make significant improvements in function in a reasonable and predictable amount of time.     Equipment Recommendations   None recommended by OT      Precautions/Restrictions   Precautions Precautions: Fall Precaution/Restrictions Comments: abdominal surgery Required Braces or Orthoses: Other Brace (abdomincal binder) Restrictions Weight Bearing Restrictions Per Provider Order: No     Mobility Bed Mobility               General bed mobility comments: pt oob to  recliner and remained    Transfers Overall transfer level: Needs assistance Equipment used: Rolling walker (2 wheels) Transfers: Sit to/from Stand, Bed to chair/wheelchair/BSC Sit to Stand: Contact guard assist     Step pivot transfers: Contact guard assist     General transfer comment: verbal cues for hand placement      Balance Overall balance assessment: Needs assistance Sitting-balance support: Single extremity supported Sitting balance-Leahy Scale: Good     Standing balance support: Bilateral upper extremity supported, During functional activity, Reliant on assistive device for balance Standing balance-Leahy Scale: Fair Standing balance comment: reliant on UE support with RW for ambulation, static is fair                           ADL either performed or assessed with clinical judgement   ADL Overall ADL's : Needs assistance/impaired Eating/Feeding: Independent;Sitting   Grooming: Wash/dry face;Wash/dry hands;Oral care;Brushing hair;Set up;Sitting   Upper Body Bathing: Contact guard assist;Sitting   Lower Body Bathing: Maximal assistance;Sitting/lateral leans;Sit to/from stand   Upper Body Dressing : Contact guard assist   Lower Body Dressing: Maximal assistance;Sitting/lateral leans;Sit to/from stand   Toilet Transfer: Contact guard assist;Rolling walker (2 wheels);Grab bars   Toileting- Clothing Manipulation and Hygiene: Minimal assistance;Sitting/lateral lean;Sit to/from stand       Functional mobility during ADLs: Contact guard assist;Rolling walker (2 wheels) General ADL Comments: patient insists husband can manage LB as OT reinfrced AE abailable, will trial prior to d/c     Vision Baseline Vision/History: 0 No visual deficits Ability to See in Adequate Light: 0 Adequate Patient Visual Report: No change from baseline       Perception Perception: Within Functional Limits       Praxis  Praxis: WFL       Pertinent Vitals/Pain Pain  Assessment Pain Assessment: No/denies pain     Extremity/Trunk Assessment Upper Extremity Assessment Upper Extremity Assessment: Left hand dominant;Overall WFL for tasks assessed   Lower Extremity Assessment Lower Extremity Assessment: Generalized weakness   Cervical / Trunk Assessment Cervical / Trunk Assessment: Other exceptions (bdy habitus)   Communication Communication Communication: No apparent difficulties   Cognition Arousal: Alert Behavior During Therapy: WFL for tasks assessed/performed Cognition: No apparent impairments                               Following commands: Intact       Cueing  General Comments   Cueing Techniques: Verbal cues  abdominal binder in place           Home Living Family/patient expects to be discharged to:: Private residence Living Arrangements: Spouse/significant other Available Help at Discharge: Family;Available 24 hours/day Type of Home: House Home Access: Stairs to enter [employer] of Steps: 3 Entrance Stairs-Rails: Right Home Layout: One level     Bathroom Shower/Tub: Walk-in Pensions consultant: Standard Bathroom Accessibility: Yes How Accessible: Accessible via walker Home Equipment: Grab bars - tub/shower          Prior Functioning/Environment Prior Level of Function : Independent/Modified Independent;Driving             Mobility Comments: no AD ADLs Comments: Indep A/IADL's    OT Problem List: Decreased strength;Decreased activity tolerance;Impaired balance (sitting and/or standing);Obesity   OT Treatment/Interventions: Self-care/ADL training;Therapeutic exercise;Energy conservation;DME and/or AE instruction;Therapeutic activities;Patient/family education;Balance training      OT Goals(Current goals can be found in the care plan section)   Acute Rehab OT Goals Patient Stated Goal: to go home with my husband OT Goal Formulation: With patient Time For Goal  Achievement: 11/27/23 Potential to Achieve Goals: Good ADL Goals Pt Will Perform Lower Body Bathing: with min assist;with adaptive equipment;sit to/from stand;sitting/lateral leans;with caregiver independent in assisting Pt Will Perform Lower Body Dressing: with min assist;sitting/lateral leans;sit to/from stand;with adaptive equipment;with caregiver independent in assisting Pt Will Transfer to Toilet: with supervision;ambulating Pt Will Perform Toileting - Clothing Manipulation and hygiene: with set-up;sitting/lateral leans;sit to/from stand Additional ADL Goal #1: Patient will teach back 3/4 strategies for abd prec and ECTs for ADL's with min cues   OT Frequency:  Min 2X/week       AM-PAC OT "6 Clicks" Daily Activity     Outcome Measure Help from another person eating meals?: None Help from another person taking care of personal grooming?: A Little Help from another person toileting, which includes using toliet, bedpan, or urinal?: A Little Help from another person bathing (including washing, rinsing, drying)?: A Lot Help from another person to put on and taking off regular upper body clothing?: A Little Help from another person to put on and taking off regular lower body clothing?: A Lot 6 Click Score: 17   End of Session Equipment Utilized During Treatment: Gait belt;Rolling walker (2 wheels);Other (comment) (abdominal binder) Nurse Communication: Mobility status  Activity Tolerance: Patient tolerated treatment well Patient left: in chair;with call bell/phone within reach;with chair alarm set  OT Visit Diagnosis: Unsteadiness on feet (R26.81);Muscle weakness (generalized) (M62.81)                Time: 8295-6213 OT Time Calculation (min): 36 min Charges:  OT General Charges $OT Visit: 1 Visit OT Evaluation $OT Eval Low  Complexity: 1 Low OT Treatments $Self Care/Home Management : 8-22 mins  Norrine Ballester OT/L Acute Rehabilitation Department  [phone]' 11/13/2023, 4:01 PM

## 2023-11-13 NOTE — Progress Notes (Signed)
 PHARMACY - ANTICOAGULATION CONSULT NOTE  Pharmacy Consult for IV heparin  + warfarin Indication: History of mech aortic valve holding Coumadin  for surgery.  No Known Allergies  Patient Measurements: Height: 5\' 2"  (157.5 cm) Weight: 78.2 kg (172 lb 6.4 oz) IBW/kg (Calculated) : 50.1 HEPARIN  DW (KG): 67.3  Vital Signs: Temp: 98 F (36.7 C) (05/03 0555) Temp Source: Oral (05/03 0555) BP: 130/58 (05/03 0555) Pulse Rate: 92 (05/03 0555)  Labs: Recent Labs    11/11/23 0549 11/12/23 0504 11/12/23 1213 11/12/23 1922 11/13/23 0536 11/13/23 1116  HGB 13.8 11.4*  --   --  11.8*  --   HCT 41.8 35.2*  --   --  37.0  --   PLT 249 213  --   --  228  --   LABPROT  --  14.1  --   --  15.5*  --   INR  --  1.1  --   --  1.2  --   HEPARINUNFRC  --  0.23*   < > 0.72* 0.42 0.35  CREATININE 1.36* 1.16*  --   --  1.42*  --    < > = values in this interval not displayed.    Estimated Creatinine Clearance: 37.7 mL/min (A) (by C-G formula based on SCr of 1.42 mg/dL (H)).  Medications:  Medications Prior to Admission  Medication Sig Dispense Refill   acetaminophen  (TYLENOL ) 500 MG tablet Take 500-1,000 mg by mouth every 6 (six) hours as needed for headache, moderate pain (pain score 4-6) or mild pain (pain score 1-3) (Sleep).     allopurinol  (ZYLOPRIM ) 100 MG tablet Take 100 mg by mouth daily.     Calcium  Carbonate-Vitamin D 600-400 MG-UNIT tablet Take 1 tablet by mouth 2 (two) times daily.     colchicine  0.6 MG tablet Take 1 tablet (0.6 mg total) by mouth daily for 3 doses. Take 2 tablets right away, take the 2nd dose 1.5 hours later (Patient taking differently: Take 0.6 mg by mouth See admin instructions.  For flare-up Take 2 tablets right away, take the 2nd dose 1.5 hours later) 3 tablet 0   furosemide  (LASIX ) 20 MG tablet Take 20 mg by mouth daily.  5   levothyroxine  (SYNTHROID ) 75 MCG tablet Take 75 mcg by mouth daily before breakfast.     losartan  (COZAAR ) 25 MG tablet Take 50 mg by mouth  daily.     Multiple Vitamins-Minerals (PRESERVISION/LUTEIN PO) Take 1 capsule by mouth daily with breakfast.      naphazoline-glycerin (CLEAR EYES REDNESS) 0.012-0.25 % SOLN Place 1-2 drops into both eyes 4 (four) times daily as needed for eye irritation.     pantoprazole  (PROTONIX ) 40 MG tablet Take 40 mg by mouth daily.     rosuvastatin  (CRESTOR ) 20 MG tablet Take 20 mg by mouth daily.     warfarin (COUMADIN ) 3 MG tablet TAKE 1 TABLET DAILY EXCEPT 1/2 TABLET ON SUNDAYS OR AS DIRECTED BY ANTICOAGULATION CLINIC. 90 tablet 1    Assessment: Pharmacy consulted to manage IV heparin  for this 68 yo female who was on warfarin prior to admission for a mechanical aortic valve. Warfarin held for hernia repair surgery, completed 5/1. Resuming warfarin per Pharmacy on 5/2.  Baseline INR, aPTT: mildly elevated but subtherapeutic Prior anticoagulation: warfarin 3 mg daily, except 1.5 mg qSunday; last dose 4/25 Reported to have a history of bleeding on Lovenox   Significant events:  Today, 11/13/2023: CBC: Hgb slightly low postop as expected; Plt stable WNL Confirmatory heparin  level remains  therapeutic on 900 units/hr No bleeding or infusion issues per nursing Major drug interactions: no major interactions with warfarin noted Eating 25% of meals (had been 100% pre-op) SCr remains elevated   Goal of Therapy: Heparin  level 0.3-0.7 units/ml INR 2-3 (confirmed in [facility] clinic documentation) Monitor platelets by anticoagulation protocol: Yes   Plan: Continue heparin  IV infusion at 900 units/hr Warfarin 3 mg PO tonight at 18:00 Daily INR, HL, and CBC Monitor for signs of bleeding or thrombosis Recommended duration of heparin -warfarin overlap is 5 days AND until at least 2 consecutive therapeutic INRs achieved  Tera Fellows, PharmD, BCPS [phone] 11/13/2023, 11:51 AM

## 2023-11-13 NOTE — Progress Notes (Signed)
 Mobility Specialist - Progress Note   11/13/23 1514  Mobility  Activity Ambulated with assistance in hallway  Level of Assistance Contact guard assist, steadying assist  Assistive Device Front wheel walker  Distance Ambulated (ft) 100 ft  Range of Motion/Exercises Active  Activity Response Tolerated well  Mobility Referral Yes  Mobility visit 1 Mobility  Mobility Specialist Start Time (ACUTE ONLY) 1455  Mobility Specialist Stop Time (ACUTE ONLY) 1514  Mobility Specialist Time Calculation (min) (ACUTE ONLY) 19 min   Pt was found on recliner chair and agreeable to ambulate. Grew fatigued with session. Unable to get SPO2 reading during ambulation. At EOS returned to recliner chair with all needs met. Call bell in reach.  Lorna Rose Mobility Specialist

## 2023-11-13 NOTE — Plan of Care (Signed)

## 2023-11-13 NOTE — Progress Notes (Signed)
  Progress Note   Patient: Elizabeth Morton ZOX:[id number] DOB: 07/08/1956 DOA: 11/08/2023     5 DOS: the patient was seen and examined on 11/13/2023 at 8:52AM      Brief hospital course: 68 y.o. F with hx CVA due to aortic aneurysm, on warfarin, hx mech aortic valve after stroke, CAD s/p CABG, and hypothyroidism who was admitted for heparin  bridge prior to elective hernia repair.    S/p laparoscopic hernia repair on 5/1, post op course complicated by hypoxia.     Assessment and Plan: * Recurrent ventral hernia S/p laparoscopic repair of ventral hernia with mesh 5/1 by Dr. Hershell Lose - Postop care per general surgery     Acute respiratory failure with hypoxia (HCC) Still on oxygen.  This appears to be all from atelectasis from abdominal pain. - Incentive spirometry - Wean oxygen as able  Stage 3a chronic kidney disease (HCC) Creatinine slightly up but still within baseline range - Continue Lasix , Losartan  -Daily BMP  Hypertension Cerebrovascular disease Longterm use of warfarin anticoagulation See prior note - Continue heparin  bridge - Continue warfarin - Continue losartan , Lasix , Crestor       Hypothyroidism - Continue Levothyroxine           Subjective: Patient still has a lot of abdominal soreness, poor appetite.  No fever, no sputum, no leg swelling, no orthopnea.     Physical Exam: BP (!) 114/57   Pulse 95   Temp 97.7 F (36.5 C) (Oral)   Resp 20   Ht 5\' 2"  (1.575 m)   Wt 78.2 kg   SpO2 93%   BMI 31.53 kg/m   Obese adult female, interactive and appropriate, sitting up in bed, eating breakfast Appears uncomfortable RRR, no murmurs, no peripheral edema Respiratory effort shallow, lungs diminished, no air movement auscultated, no wheezing or rales either Abdomen with diffuse discomfort to palpation, abdominal binder still on Attention normal, affect appropriate, judgment and insight appear normal, face metric, speech fluent, oriented to person, place,  and time    Data Reviewed: Basic metabolic panel showed creatinine 1.4 INR 1.2 Hemoglobin 11  Family Communication:     Disposition: Status is: Inpatient         Author: Ephriam Hashimoto, MD 11/13/2023 3:30 PM  For on call review [email or web address].

## 2023-11-13 NOTE — Progress Notes (Signed)
 2 Days Post-Op   Subjective/Chief Complaint: Tolerating regular diet, although not much appetite No nausea or vomiting No BM since surgery Weaning off oxygen  Objective: Vital signs in last 24 hours: Temp:  [97.6 F (36.4 C)-99 F (37.2 C)] 98 F (36.7 C) (05/03 0555) Pulse Rate:  [85-92] 92 (05/03 0555) Resp:  [20] 20 (05/03 0555) BP: (126-130)/(58-69) 130/58 (05/03 0555) SpO2:  [88 %-93 %] 91 % (05/03 0555) Last BM Date : 11/08/23 (per patient)  Intake/Output from previous day: 05/02 0701 - 05/03 0700 In: 604 [P.O.:440; I.V.:164] Out: 200 [Urine:200] Intake/Output this shift: No intake/output data recorded.  WDWN in NAD Abd - soft, non-distended; incisional tenderness Incisions c/d/i  Lab Results:  Recent Labs    11/12/23 0504 11/13/23 0536  WBC 8.0 8.8  HGB 11.4* 11.8*  HCT 35.2* 37.0  PLT 213 228   BMET Recent Labs    11/12/23 0504 11/13/23 0536  NA 135 135  K 3.9 4.1  CL 105 101  CO2 21* 26  GLUCOSE 141* 118*  BUN 17 20  CREATININE 1.16* 1.42*  CALCIUM  8.7* 9.3   PT/INR Recent Labs    11/12/23 0504 11/13/23 0536  LABPROT 14.1 15.5*  INR 1.1 1.2   ABG Recent Labs    11/11/23 1707  PHART 7.35  HCO3 21.6    Studies/Results: DG CHEST PORT 1 VIEW Result Date: 11/11/2023 EXAM: 1 VIEW XRAY OF THE CHEST 11/11/2023 06:13:00 PM COMPARISON: 01/17/2019. CLINICAL HISTORY: 200808 Hypoxia [id number]. Reason for exam: hypoxia FINDINGS: LUNGS AND PLEURA: Low lung volumes with suspected small bilateral pleural effusions. HEART AND MEDIASTINUM: Prosthetic valve. Postsurgical changes related to prior CABG. Median sternotomy. BONES AND SOFT TISSUES: No acute osseous abnormality. IMPRESSION: 1. Suspected small bilateral pleural effusions. Electronically signed by: Zadie Herter MD 11/11/2023 08:33 PM EDT RP Workstation: [id number]    Anti-infectives: Anti-infectives (From admission, onward)    Start     Dose/Rate Route Frequency Ordered Stop   11/11/23 2200   ceFAZolin  (ANCEF ) IVPB 2g/100 mL premix        2 g 200 mL/hr over 30 Minutes Intravenous Every 8 hours 11/11/23 1930 11/12/23 1356   11/11/23 1045  ceFAZolin  (ANCEF ) IVPB 2g/100 mL premix        2 g 200 mL/hr over 30 Minutes Intravenous On call to O.R. 11/09/23 0748 11/11/23 1021   11/11/23 1033  ceFAZolin  (ANCEF ) 2-4 GM/100ML-% IVPB       Note to Pharmacy: Aloha Arnold D: cabinet override      11/11/23 1033 11/11/23 2244       Assessment/Plan: s/p Procedure(s) with comments: REPAIR, HERNIA, VENTRAL, LAPAROSCOPIC,LYSIS OF ADHESIONS, BILATERAL TAP BLOCK (N/A) - LAPAROSCOPIC VENTRAL WALL HERNIA REPAIR WITH LYSIS OF ADHESIONS 11/11/23 - Dr. Hershell Lose  Awaiting INR > 2.0 Bowel regimen - BID Colace/ PRN Miralax  Continue current pain regimen/ abdominal binder   LOS: 5 days    Rella Cardinal 11/13/2023

## 2023-11-13 NOTE — Progress Notes (Signed)
 PHARMACY - ANTICOAGULATION CONSULT NOTE  Pharmacy Consult for IV heparin  + warfarin Indication: History of mech aortic valve holding Coumadin  for surgery.  No Known Allergies  Patient Measurements: Height: 5\' 2"  (157.5 cm) Weight: 78.2 kg (172 lb 6.4 oz) IBW/kg (Calculated) : 50.1 HEPARIN  DW (KG): 67.3  Vital Signs: Temp: 98 F (36.7 C) (05/03 0555) Temp Source: Oral (05/03 0555) BP: 130/58 (05/03 0555) Pulse Rate: 92 (05/03 0555)  Labs: Recent Labs    11/11/23 0549 11/11/23 0549 11/12/23 0504 11/12/23 1213 11/12/23 1922 11/13/23 0536  HGB 13.8  --  11.4*  --   --  11.8*  HCT 41.8  --  35.2*  --   --  37.0  PLT 249  --  213  --   --  228  LABPROT  --   --  14.1  --   --   --   INR  --   --  1.1  --   --   --   HEPARINUNFRC  --    < > 0.23* 0.61 0.72* 0.42  CREATININE 1.36*  --  1.16*  --   --   --    < > = values in this interval not displayed.    Estimated Creatinine Clearance: 46.2 mL/min (A) (by C-G formula based on SCr of 1.16 mg/dL (H)).  Medications:  Medications Prior to Admission  Medication Sig Dispense Refill   acetaminophen  (TYLENOL ) 500 MG tablet Take 500-1,000 mg by mouth every 6 (six) hours as needed for headache, moderate pain (pain score 4-6) or mild pain (pain score 1-3) (Sleep).     allopurinol  (ZYLOPRIM ) 100 MG tablet Take 100 mg by mouth daily.     Calcium  Carbonate-Vitamin D 600-400 MG-UNIT tablet Take 1 tablet by mouth 2 (two) times daily.     colchicine  0.6 MG tablet Take 1 tablet (0.6 mg total) by mouth daily for 3 doses. Take 2 tablets right away, take the 2nd dose 1.5 hours later (Patient taking differently: Take 0.6 mg by mouth See admin instructions.  For flare-up Take 2 tablets right away, take the 2nd dose 1.5 hours later) 3 tablet 0   furosemide  (LASIX ) 20 MG tablet Take 20 mg by mouth daily.  5   levothyroxine  (SYNTHROID ) 75 MCG tablet Take 75 mcg by mouth daily before breakfast.     losartan  (COZAAR ) 25 MG tablet Take 50 mg by mouth  daily.     Multiple Vitamins-Minerals (PRESERVISION/LUTEIN PO) Take 1 capsule by mouth daily with breakfast.      naphazoline-glycerin (CLEAR EYES REDNESS) 0.012-0.25 % SOLN Place 1-2 drops into both eyes 4 (four) times daily as needed for eye irritation.     pantoprazole  (PROTONIX ) 40 MG tablet Take 40 mg by mouth daily.     rosuvastatin  (CRESTOR ) 20 MG tablet Take 20 mg by mouth daily.     warfarin (COUMADIN ) 3 MG tablet TAKE 1 TABLET DAILY EXCEPT 1/2 TABLET ON SUNDAYS OR AS DIRECTED BY ANTICOAGULATION CLINIC. 90 tablet 1    Assessment: Pharmacy consulted to manage IV heparin  for this 68 yo female who was on warfarin prior to admission for a mechanical aortic valve. Warfarin held for hernia repair surgery, completed 5/1. Resuming warfarin per Pharmacy on 5/2.  Baseline INR, aPTT: mildly elevated but subtherapeutic Prior anticoagulation: warfarin 3 mg daily, except 1.5 mg qSunday; last dose 4/25 Reported to have a history of bleeding on Lovenox   Significant events:  Today, 11/13/2023: HL 0.42 therapeutic 900 units/hr Hgb 11.8, plts  228 No bleeding reported  Goal of Therapy: Heparin  level 0.3-0.7 units/ml INR 2-3 (confirmed in [facility] clinic documentation) Monitor platelets by anticoagulation protocol: Yes  Plan: Continue heparin  IV infusion at 900 units/hr Confirmatory level in 6 hours Daily CBC   Beau Bound RPh 11/13/2023, 5:59 AM

## 2023-11-14 DIAGNOSIS — K432 Incisional hernia without obstruction or gangrene: Secondary | ICD-10-CM | POA: Diagnosis not present

## 2023-11-14 LAB — BASIC METABOLIC PANEL WITH GFR
Anion gap: 7 (ref 5–15)
BUN: 27 mg/dL — ABNORMAL HIGH (ref 8–23)
CO2: 25 mmol/L (ref 22–32)
Calcium: 9.2 mg/dL (ref 8.9–10.3)
Chloride: 102 mmol/L (ref 98–111)
Creatinine, Ser: 1.7 mg/dL — ABNORMAL HIGH (ref 0.44–1.00)
GFR, Estimated: 33 mL/min — ABNORMAL LOW (ref >60)
Glucose, Bld: 113 mg/dL — ABNORMAL HIGH (ref 70–99)
Potassium: 4 mmol/L (ref 3.5–5.1)
Sodium: 134 mmol/L — ABNORMAL LOW (ref 135–145)

## 2023-11-14 LAB — HEPARIN LEVEL (UNFRACTIONATED)
Heparin Unfractionated: 0.27 [IU]/mL — ABNORMAL LOW (ref 0.30–0.70)
Heparin Unfractionated: 0.27 [IU]/mL — ABNORMAL LOW (ref 0.30–0.70)
Heparin Unfractionated: 0.36 [IU]/mL (ref 0.30–0.70)

## 2023-11-14 LAB — PROTIME-INR
INR: 1.6 — ABNORMAL HIGH (ref 0.8–1.2)
Prothrombin Time: 19.2 s — ABNORMAL HIGH (ref 11.4–15.2)

## 2023-11-14 LAB — CBC
HCT: 35.2 % — ABNORMAL LOW (ref 36.0–46.0)
Hemoglobin: 11.1 g/dL — ABNORMAL LOW (ref 12.0–15.0)
MCH: 30.2 pg (ref 26.0–34.0)
MCHC: 31.5 g/dL (ref 30.0–36.0)
MCV: 95.7 fL (ref 80.0–100.0)
Platelets: 221 10*3/uL (ref 150–400)
RBC: 3.68 MIL/uL — ABNORMAL LOW (ref 3.87–5.11)
RDW: 14.6 % (ref 11.5–15.5)
WBC: 9 10*3/uL (ref 4.0–10.5)
nRBC: 0 % (ref 0.0–0.2)

## 2023-11-14 MED ORDER — WARFARIN SODIUM 3 MG PO TABS
3.0000 mg | ORAL_TABLET | Freq: Once | ORAL | Status: AC
  Administered 2023-11-14: 3 mg via ORAL
  Filled 2023-11-14: qty 1

## 2023-11-14 MED ORDER — BISACODYL 10 MG RE SUPP
10.0000 mg | Freq: Every day | RECTAL | Status: DC | PRN
  Administered 2023-11-14: 10 mg via RECTAL
  Filled 2023-11-14: qty 1

## 2023-11-14 MED ORDER — SODIUM CHLORIDE 0.9 % IV SOLN: INTRAVENOUS | Status: DC

## 2023-11-14 NOTE — Progress Notes (Signed)
  Progress Note   Patient: Elizabeth Morton:[id number] DOB: Jul 03, 1956 DOA: 11/08/2023     6 DOS: the patient was seen and examined on 11/14/2023 at 9:10AM and 2:40PM      Brief hospital course: 68 y.o. F with hx CVA due to aortic aneurysm, on warfarin, hx mech aortic valve after stroke, CAD s/p CABG, and hypothyroidism who was admitted for heparin  bridge prior to elective hernia repair.    S/p laparoscopic hernia repair on 5/1, post op course complicated by hypoxia.     Assessment and Plan: * Recurrent ventral hernia S/p laparoscopic repair of ventral hernia with mesh 5/1 by Dr. Hershell Lose - Postop care per general surgery - Heparin  resumed by Surgery last night    Acute respiratory failure with hypoxia (HCC) Improving, weaned down to 1 L.  See prior notes, chest x-ray ABG unremarkable.  Clearly from hypopnea due to abdominal pain - I-S - Wean oxygen as able  AKI on CKD Stage 3a chronic kidney disease (HCC) Creatinine trended up to 1.7 today - Hold losartan  and Lasix  - Gentle hydration   Hypertension Cerebrovascular disease Longterm use of warfarin anticoagulation INR up to 1.6 today - Continue heparin  drip - Continue warfarin - Hold losartan  and Lasix  - Continue Crestor   Gastro-esophageal reflux disease without esophagitis -Continue Protonix   Gout -Continue allopurinol     Obesity BMI 33, complicates care, class I obesity  Hypothyroidism - Continue Levothyroxine           Subjective: Patient is doing okay, little dizzy today, pain is overall better, no vomiting.  She is constipated     Physical Exam: BP (!) 113/58 (BP Location: Left Arm)   Pulse 92   Temp 98.8 F (37.1 C) (Oral)   Resp 16   Ht 5\' 2"  (1.575 m)   Wt 78.2 kg   SpO2 (!) 87%   BMI 31.53 kg/m   Obese adult female, sitting up in recliner, interactive and appropriate, eating breakfast RRR, no murmurs, no peripheral edema Respiratory rate normal, lungs diminished but better air  movement than yesterday, no wheezing or rales Abdomen with diffuse tenderness to palpation, abdominal binder in place Attention normal, affect appropriate, judgment and insight appear normal, face is symmetric, oriented to person, place, time  Data Reviewed: Basic metabolic panel notable for creatinine up to 1.7 Hemoglobin 11, no change INR 1.6  Family Communication:     Disposition: Status is: Inpatient         Author: Ephriam Hashimoto, MD 11/14/2023 3:54 PM  For on call review [email or web address].

## 2023-11-14 NOTE — Progress Notes (Signed)
 PHARMACY - ANTICOAGULATION CONSULT NOTE  Pharmacy Consult for IV heparin  + warfarin Indication: History of mech aortic valve holding Coumadin  for surgery.  No Known Allergies  Patient Measurements: Height: 5\' 2"  (157.5 cm) Weight: 78.2 kg (172 lb 6.4 oz) IBW/kg (Calculated) : 50.1 HEPARIN  DW (KG): 67.3  Vital Signs: Temp: 99.2 F (37.3 C) (05/04 2116) Temp Source: Oral (05/04 2116) BP: 120/109 (05/04 2116) Pulse Rate: 96 (05/04 2116)  Labs: Recent Labs    11/12/23 0504 11/12/23 1213 11/13/23 0536 11/13/23 1116 11/14/23 0543 11/14/23 1247 11/14/23 2146  HGB 11.4*  --  11.8*  --  11.1*  --   --   HCT 35.2*  --  37.0  --  35.2*  --   --   PLT 213  --  228  --  221  --   --   LABPROT 14.1  --  15.5*  --  19.2*  --   --   INR 1.1  --  1.2  --  1.6*  --   --   HEPARINUNFRC 0.23*   < > 0.42   < > 0.27* 0.27* 0.36  CREATININE 1.16*  --  1.42*  --  1.70*  --   --    < > = values in this interval not displayed.    Estimated Creatinine Clearance: 31.5 mL/min (A) (by C-G formula based on SCr of 1.7 mg/dL (H)).  Medications:  Medications Prior to Admission  Medication Sig Dispense Refill   acetaminophen  (TYLENOL ) 500 MG tablet Take 500-1,000 mg by mouth every 6 (six) hours as needed for headache, moderate pain (pain score 4-6) or mild pain (pain score 1-3) (Sleep).     allopurinol  (ZYLOPRIM ) 100 MG tablet Take 100 mg by mouth daily.     Calcium  Carbonate-Vitamin D 600-400 MG-UNIT tablet Take 1 tablet by mouth 2 (two) times daily.     colchicine  0.6 MG tablet Take 1 tablet (0.6 mg total) by mouth daily for 3 doses. Take 2 tablets right away, take the 2nd dose 1.5 hours later (Patient taking differently: Take 0.6 mg by mouth See admin instructions.  For flare-up Take 2 tablets right away, take the 2nd dose 1.5 hours later) 3 tablet 0   furosemide  (LASIX ) 20 MG tablet Take 20 mg by mouth daily.  5   levothyroxine  (SYNTHROID ) 75 MCG tablet Take 75 mcg by mouth daily before breakfast.      losartan  (COZAAR ) 25 MG tablet Take 50 mg by mouth daily.     Multiple Vitamins-Minerals (PRESERVISION/LUTEIN PO) Take 1 capsule by mouth daily with breakfast.      naphazoline-glycerin (CLEAR EYES REDNESS) 0.012-0.25 % SOLN Place 1-2 drops into both eyes 4 (four) times daily as needed for eye irritation.     pantoprazole  (PROTONIX ) 40 MG tablet Take 40 mg by mouth daily.     rosuvastatin  (CRESTOR ) 20 MG tablet Take 20 mg by mouth daily.     warfarin (COUMADIN ) 3 MG tablet TAKE 1 TABLET DAILY EXCEPT 1/2 TABLET ON SUNDAYS OR AS DIRECTED BY ANTICOAGULATION CLINIC. 90 tablet 1    Assessment: Pharmacy consulted to manage IV heparin  for this 68 yo female who was on warfarin prior to admission for a mechanical aortic valve. Warfarin held for hernia repair surgery, completed 5/1. Resuming warfarin per Pharmacy on 5/2.  Baseline INR, aPTT: mildly elevated but subtherapeutic Prior anticoagulation: warfarin 3 mg daily, except 1.5 mg qSunday; last dose 4/25 Reported to have a history of bleeding on Lovenox   Significant  events:  Today, 11/14/2023: CBC: Hgb slightly low postop as expected; Plt stable WNL Repeat heparin  level remains slightly subtherapeutic and unchanged despite worsening CrCl AND rate increase from 900 >> 1000 units/hr Confirmed with RN, no recent pump pauses, occluded/leaking lines, IV site infiltration; running at correct rate Lab confirmed correct sample tested (identical result as this morning's HL drawn while running at 900 units/hr) No bleeding issues per nursing Major drug interactions: no major interactions with warfarin noted Eating 25-50% of meals (had been 100% pre-op) SCr remains elevated and rising  2146 HL 0.36 therapeutic on 1100 units/hr   Goal of Therapy: Heparin  level 0.3-0.7 units/ml INR 2-3 (confirmed in [facility] clinic documentation) Monitor platelets by anticoagulation protocol: Yes   Plan: Continue heparin  drip at 1100 units/hr Daily INR, HL, and  CBC Monitor for signs of bleeding or thrombosis Recommended duration of heparin -warfarin overlap is 5 days AND until at least 2 consecutive therapeutic INRs achieved  Beau Bound RPh 11/14/2023, 10:46 PM

## 2023-11-14 NOTE — Progress Notes (Signed)
 PHARMACY - ANTICOAGULATION CONSULT NOTE  Pharmacy Consult for Heparin  Indication:  mechanical aortic valve  No Known Allergies  Patient Measurements: Height: 5\' 2"  (157.5 cm) Weight: 78.2 kg (172 lb 6.4 oz) IBW/kg (Calculated) : 50.1 HEPARIN  DW (KG): 67.3  Vital Signs: Temp: 98.5 F (36.9 C) (05/04 0522) Temp Source: Oral (05/04 0522) BP: 114/48 (05/04 0522) Pulse Rate: 92 (05/04 0522)  Labs: Recent Labs    11/12/23 0504 11/12/23 1213 11/13/23 0536 11/13/23 1116 11/14/23 0543  HGB 11.4*  --  11.8*  --  11.1*  HCT 35.2*  --  37.0  --  35.2*  PLT 213  --  228  --  221  LABPROT 14.1  --  15.5*  --   --   INR 1.1  --  1.2  --   --   HEPARINUNFRC 0.23*   < > 0.42 0.35 0.27*  CREATININE 1.16*  --  1.42*  --   --    < > = values in this interval not displayed.    Estimated Creatinine Clearance: 37.7 mL/min (A) (by C-G formula based on SCr of 1.42 mg/dL (H)).   Medical History: Past Medical History:  Diagnosis Date   Aortic stenosis    Aortic valve replacement 2006   Bacteremia 01/19/2019   CAD (coronary artery disease)    a. s/p CABG 2006 at time of AVR.   CVA (cerebral infarction)    related to thrombosed aortic root aneurysm   Dyslipidemia    Dyspnea    Gallstones    GERD (gastroesophageal reflux disease)    Gout    Heart murmur    History of kidney stones    Hx of CABG    2006,LIMA to LAD, SVG to diagonal   Hx of transfusion of packed red blood cells    Hypertension    Hypothyroidism    Iron deficiency anemia    Lower GI bleed 06/2015   Pericardial effusion    Insignificant small pericardial effusion seen on echo, November, 2013   Peripheral vascular disease (HCC) 2006   blood clot -stroke   Pneumonia    S/P aortic valve replacement    a. Bent[place] procedure,#21 [employer] mechanical valve conduit with reimplantation of the coronaries 2006   Status post colonoscopy with polypectomy    "bleeding; colonoscopy was ~ 06/18/2015"   Stroke [facility]) 2006   no  deficits    Warfarin anticoagulation    coumadin  therapy    Assessment: AC/Heme: IV heparin  while warfarin on hold for surgery. hx mech aortic valve and CVA. -PTA warfarin per Coumadin  Clinic: 3 mg daily except 1.5 mg on Sundays  -Hx of bleeding when on Lovenox  -Hep level 0.27, Hgb 11.1. Plts 221 both stable.  Goal of Therapy:  Heparin  level 0.3-0.7 units/ml Monitor platelets by anticoagulation protocol: Yes   Plan:  Increase IV heparin  to 1000 units/hr Recheck HL in 6 hrs. Daily HL and CBC   Gazella Anglin S. Zannie Hey, PharmD, BCPS Clinical Staff Pharmacist Enis Harsh Stillinger 11/14/2023,6:18 AM

## 2023-11-14 NOTE — Progress Notes (Signed)
 Mobility Specialist - Progress Note  (RA) Pre-mobility: 89 bpm HR, 96% SpO2 During mobility: 123 bpm HR, 90% SpO2 Post-mobility: 90 bpm HR, 93% SPO2   11/14/23 1131  Mobility  Activity Ambulated with assistance in hallway  Level of Assistance Contact guard assist, steadying assist  Assistive Device Front wheel walker  Distance Ambulated (ft) 100 ft  Range of Motion/Exercises Active  Activity Response Tolerated well  Mobility Referral Yes  Mobility visit 1 Mobility  Mobility Specialist Start Time (ACUTE ONLY) 1100  Mobility Specialist Stop Time (ACUTE ONLY) 1131  Mobility Specialist Time Calculation (min) (ACUTE ONLY) 31 min   Pt was found in bed and agreeable to ambulate. Found on 1L [place] with SPO2 98% and RN agreeable for writer to ambulate pt on RA. Pt encouraged pursed lip breathing during session. Grew fatigued with session. At EOS returned to recliner chair with all need met. Call bell in reach and RN notified of session.  Lorna Rose Mobility Specialist

## 2023-11-14 NOTE — Progress Notes (Signed)
 Notified by Nurse tech of low BP, when I assessed patient she was feeling sleepy and a little lightheaded sitting up in the chair. We transferred back to bed and I layed her supine with feet elevated. BP increased and lightheadedness subsided. MD Danford notified and came to bedside and assessed patient. Now giving 1L saline at 75/hr. Will continue to monitor.

## 2023-11-14 NOTE — Progress Notes (Signed)
 PHARMACY - ANTICOAGULATION CONSULT NOTE  Pharmacy Consult for IV heparin  + warfarin Indication: History of mech aortic valve holding Coumadin  for surgery.  No Known Allergies  Patient Measurements: Height: 5\' 2"  (157.5 cm) Weight: 78.2 kg (172 lb 6.4 oz) IBW/kg (Calculated) : 50.1 HEPARIN  DW (KG): 67.3  Vital Signs: Temp: 98.8 F (37.1 C) (05/04 1317) Temp Source: Oral (05/04 1317) BP: 93/45 (05/04 1317) Pulse Rate: 92 (05/04 1317)  Labs: Recent Labs    11/12/23 0504 11/12/23 1213 11/13/23 0536 11/13/23 1116 11/14/23 0543 11/14/23 1247  HGB 11.4*  --  11.8*  --  11.1*  --   HCT 35.2*  --  37.0  --  35.2*  --   PLT 213  --  228  --  221  --   LABPROT 14.1  --  15.5*  --  19.2*  --   INR 1.1  --  1.2  --  1.6*  --   HEPARINUNFRC 0.23*   < > 0.42 0.35 0.27* 0.27*  CREATININE 1.16*  --  1.42*  --  1.70*  --    < > = values in this interval not displayed.    Estimated Creatinine Clearance: 31.5 mL/min (A) (by C-G formula based on SCr of 1.7 mg/dL (H)).  Medications:  Medications Prior to Admission  Medication Sig Dispense Refill   acetaminophen  (TYLENOL ) 500 MG tablet Take 500-1,000 mg by mouth every 6 (six) hours as needed for headache, moderate pain (pain score 4-6) or mild pain (pain score 1-3) (Sleep).     allopurinol  (ZYLOPRIM ) 100 MG tablet Take 100 mg by mouth daily.     Calcium  Carbonate-Vitamin D 600-400 MG-UNIT tablet Take 1 tablet by mouth 2 (two) times daily.     colchicine  0.6 MG tablet Take 1 tablet (0.6 mg total) by mouth daily for 3 doses. Take 2 tablets right away, take the 2nd dose 1.5 hours later (Patient taking differently: Take 0.6 mg by mouth See admin instructions.  For flare-up Take 2 tablets right away, take the 2nd dose 1.5 hours later) 3 tablet 0   furosemide  (LASIX ) 20 MG tablet Take 20 mg by mouth daily.  5   levothyroxine  (SYNTHROID ) 75 MCG tablet Take 75 mcg by mouth daily before breakfast.     losartan  (COZAAR ) 25 MG tablet Take 50 mg by  mouth daily.     Multiple Vitamins-Minerals (PRESERVISION/LUTEIN PO) Take 1 capsule by mouth daily with breakfast.      naphazoline-glycerin (CLEAR EYES REDNESS) 0.012-0.25 % SOLN Place 1-2 drops into both eyes 4 (four) times daily as needed for eye irritation.     pantoprazole  (PROTONIX ) 40 MG tablet Take 40 mg by mouth daily.     rosuvastatin  (CRESTOR ) 20 MG tablet Take 20 mg by mouth daily.     warfarin (COUMADIN ) 3 MG tablet TAKE 1 TABLET DAILY EXCEPT 1/2 TABLET ON SUNDAYS OR AS DIRECTED BY ANTICOAGULATION CLINIC. 90 tablet 1    Assessment: Pharmacy consulted to manage IV heparin  for this 68 yo female who was on warfarin prior to admission for a mechanical aortic valve. Warfarin held for hernia repair surgery, completed 5/1. Resuming warfarin per Pharmacy on 5/2.  Baseline INR, aPTT: mildly elevated but subtherapeutic Prior anticoagulation: warfarin 3 mg daily, except 1.5 mg qSunday; last dose 4/25 Reported to have a history of bleeding on Lovenox   Significant events:  Today, 11/14/2023: CBC: Hgb slightly low postop as expected; Plt stable WNL Repeat heparin  level remains slightly subtherapeutic and unchanged despite worsening  CrCl AND rate increase from 900 >> 1000 units/hr Confirmed with RN, no recent pump pauses, occluded/leaking lines, IV site infiltration; running at correct rate Lab confirmed correct sample tested (identical result as this morning's HL drawn while running at 900 units/hr) No bleeding issues per nursing Major drug interactions: no major interactions with warfarin noted Eating 25-50% of meals (had been 100% pre-op) SCr remains elevated and rising   Goal of Therapy: Heparin  level 0.3-0.7 units/ml INR 2-3 (confirmed in [facility] clinic documentation) Monitor platelets by anticoagulation protocol: Yes   Plan: Increase heparin  IV infusion to 1100 units/hr; no bolus Repeat warfarin 3 mg PO tonight at 18:00 (normally takes 1.5 mg today) Daily INR, HL, and CBC Monitor for  signs of bleeding or thrombosis Recommended duration of heparin -warfarin overlap is 5 days AND until at least 2 consecutive therapeutic INRs achieved  Tera Fellows, PharmD, BCPS [phone] 11/14/2023, 2:33 PM

## 2023-11-15 DIAGNOSIS — K432 Incisional hernia without obstruction or gangrene: Secondary | ICD-10-CM | POA: Diagnosis not present

## 2023-11-15 LAB — BASIC METABOLIC PANEL WITH GFR
Anion gap: 7 (ref 5–15)
BUN: 23 mg/dL (ref 8–23)
CO2: 22 mmol/L (ref 22–32)
Calcium: 8.2 mg/dL — ABNORMAL LOW (ref 8.9–10.3)
Chloride: 106 mmol/L (ref 98–111)
Creatinine, Ser: 1.22 mg/dL — ABNORMAL HIGH (ref 0.44–1.00)
GFR, Estimated: 49 mL/min — ABNORMAL LOW (ref >60)
Glucose, Bld: 112 mg/dL — ABNORMAL HIGH (ref 70–99)
Potassium: 3.3 mmol/L — ABNORMAL LOW (ref 3.5–5.1)
Sodium: 135 mmol/L (ref 135–145)

## 2023-11-15 LAB — CBC
HCT: 30.7 % — ABNORMAL LOW (ref 36.0–46.0)
Hemoglobin: 9.9 g/dL — ABNORMAL LOW (ref 12.0–15.0)
MCH: 30.4 pg (ref 26.0–34.0)
MCHC: 32.2 g/dL (ref 30.0–36.0)
MCV: 94.2 fL (ref 80.0–100.0)
Platelets: 225 10*3/uL (ref 150–400)
RBC: 3.26 MIL/uL — ABNORMAL LOW (ref 3.87–5.11)
RDW: 14.5 % (ref 11.5–15.5)
WBC: 7.2 10*3/uL (ref 4.0–10.5)
nRBC: 0 % (ref 0.0–0.2)

## 2023-11-15 LAB — SURGICAL PATHOLOGY

## 2023-11-15 LAB — PROTIME-INR
INR: 1.8 — ABNORMAL HIGH (ref 0.8–1.2)
Prothrombin Time: 20.8 s — ABNORMAL HIGH (ref 11.4–15.2)

## 2023-11-15 LAB — HEPARIN LEVEL (UNFRACTIONATED): Heparin Unfractionated: 0.3 [IU]/mL (ref 0.30–0.70)

## 2023-11-15 MED ORDER — POLYETHYLENE GLYCOL 3350 17 G PO PACK: 17.0000 g | PACK | Freq: Two times a day (BID) | ORAL | Status: DC | PRN

## 2023-11-15 MED ORDER — WARFARIN SODIUM 3 MG PO TABS
3.0000 mg | ORAL_TABLET | Freq: Once | ORAL | Status: AC
  Administered 2023-11-15: 3 mg via ORAL
  Filled 2023-11-15 (×2): qty 1

## 2023-11-15 MED ORDER — BISACODYL 10 MG RE SUPP
10.0000 mg | Freq: Every day | RECTAL | Status: DC
  Filled 2023-11-15 (×2): qty 1

## 2023-11-15 MED ORDER — POTASSIUM CHLORIDE CRYS ER 20 MEQ PO TBCR
40.0000 meq | EXTENDED_RELEASE_TABLET | Freq: Once | ORAL | Status: AC
  Administered 2023-11-15: 40 meq via ORAL
  Filled 2023-11-15: qty 2

## 2023-11-15 MED ORDER — FUROSEMIDE 20 MG PO TABS
20.0000 mg | ORAL_TABLET | Freq: Every day | ORAL | Status: DC
  Administered 2023-11-16: 20 mg via ORAL
  Filled 2023-11-15: qty 1

## 2023-11-15 MED ORDER — ACETAMINOPHEN 500 MG PO TABS
1000.0000 mg | ORAL_TABLET | Freq: Four times a day (QID) | ORAL | Status: DC
  Administered 2023-11-15 – 2023-11-16 (×4): 1000 mg via ORAL
  Filled 2023-11-15 (×4): qty 2

## 2023-11-15 MED ORDER — POTASSIUM CHLORIDE CRYS ER 10 MEQ PO TBCR
10.0000 meq | EXTENDED_RELEASE_TABLET | Freq: Every day | ORAL | Status: DC
Start: 2023-11-16 — End: 2023-11-16
  Administered 2023-11-16: 10 meq via ORAL
  Filled 2023-11-15: qty 1

## 2023-11-15 NOTE — Progress Notes (Signed)
 Occupational Therapy Treatment Patient Details Name: DIMPLE WILLIQUETTE MRN: [id number] DOB: 1955-11-24 Today's Date: 11/15/2023   History of present illness Luul Torelli is a 68 y.o. F admitted to [facility] for heparin  bridge prior to elective hernia repair. S/p laparoscopic hernia repair on 5/1, post op course complicated by hypoxia.  PMH:CVA due to aortic aneurysm, on warfarin, hx mech aortic valve after stroke, CAD s/p CABG, and hypothyroidism   OT comments  Patient seen for skilled OT this am. Progressing well toward goals with BADL's and mobility. Continues to decline need for AE for LB self care as husband will assist once home but OT provided information regardless in case patient should change her mind. ECT's with + teach back and Handouts provided. Will continue to follow acutely with [facility] and family assistance and support upon discharge.       If plan is discharge home, recommend the following:  A little help with walking and/or transfers;A lot of help with bathing/dressing/bathroom;Assistance with cooking/housework;Direct supervision/assist for medications management;Direct supervision/assist for financial management;Assist for transportation;Help with stairs or ramp for entrance   Equipment Recommendations  None recommended by OT       Precautions / Restrictions Precautions Precautions: Fall Precaution/Restrictions Comments: abdominal surgery Required Braces or Orthoses: Other Brace Other Brace: abdominal binder Restrictions Weight Bearing Restrictions Per Provider Order: No       Mobility Bed Mobility Overal bed mobility: Needs Assistance Bed Mobility: Supine to Sit     Supine to sit: Supervision, HOB elevated, Used rails          Transfers Overall transfer level: Needs assistance Equipment used: Rolling walker (2 wheels) Transfers: Sit to/from Stand, Bed to chair/wheelchair/BSC Sit to Stand: Contact guard assist     Step pivot transfers: Contact guard assist      General transfer comment: min cues for hand placement     Balance Overall balance assessment: Needs assistance Sitting-balance support: Single extremity supported Sitting balance-Leahy Scale: Good     Standing balance support: Bilateral upper extremity supported, During functional activity, Reliant on assistive device for balance Standing balance-Leahy Scale: Fair Standing balance comment: reliant on UE support with RW for ambulation, static is fair                           ADL either performed or assessed with clinical judgement   ADL Overall ADL's : Needs assistance/impaired Eating/Feeding: Independent;Sitting   Grooming: Wash/dry hands;Wash/dry face;Oral care;Brushing hair;Modified independent;Sitting   Upper Body Bathing: Set up;Sitting   Lower Body Bathing: Sitting/lateral leans;Sit to/from stand;Moderate assistance   Upper Body Dressing : Supervision/safety;Sitting   Lower Body Dressing: Sitting/lateral leans;Sit to/from stand;Moderate assistance   Toilet Transfer: Supervision/safety;Rolling walker (2 wheels) Toilet Transfer Details (indicate cue type and reason): min cues for RW integration Toileting- Clothing Manipulation and Hygiene: Contact guard assist;Sitting/lateral lean;Sit to/from stand       Functional mobility during ADLs: Contact guard assist;Rolling walker (2 wheels);Supervision/safety General ADL Comments: continued to reinforce potential need for AE and provided handout, patient reports having a reacher but husband will assist with all LB ADL's, education for ECT and handouts provided    Extremity/Trunk Assessment Upper Extremity Assessment Upper Extremity Assessment: Left hand dominant   Lower Extremity Assessment Lower Extremity Assessment: Generalized weakness                 Communication Communication Communication: No apparent difficulties   Cognition Arousal: Alert Behavior During Therapy: [facility] for tasks  assessed/performed  Cognition: No apparent impairments                               Following commands: Intact        Cueing   Cueing Techniques: Verbal cues        General Comments ECT's reinfored with Handouts, abdominal binder in place    Pertinent Vitals/ Pain       Pain Assessment Pain Assessment: No/denies pain   Frequency  Min 2X/week        Progress Toward Goals  OT Goals(current goals can now be found in the care plan section)  Progress towards OT goals: Progressing toward goals  Acute Rehab OT Goals Patient Stated Goal: to go home stronger OT Goal Formulation: With patient Time For Goal Achievement: 11/27/23 Potential to Achieve Goals: Good ADL Goals Pt Will Perform Lower Body Bathing: with min assist;with adaptive equipment;sit to/from stand;sitting/lateral leans;with caregiver independent in assisting Pt Will Perform Lower Body Dressing: with min assist;sitting/lateral leans;sit to/from stand;with adaptive equipment;with caregiver independent in assisting Pt Will Transfer to Toilet: with supervision;ambulating Pt Will Perform Toileting - Clothing Manipulation and hygiene: with set-up;sitting/lateral leans;sit to/from stand Additional ADL Goal #1: Patient will teach back 3/4 strategies for abd prec and ECTs for ADL's with min cues  Plan         AM-PAC OT "6 Clicks" Daily Activity     Outcome Measure   Help from another person eating meals?: None Help from another person taking care of personal grooming?: A Little Help from another person toileting, which includes using toliet, bedpan, or urinal?: A Little Help from another person bathing (including washing, rinsing, drying)?: A Lot Help from another person to put on and taking off regular upper body clothing?: A Little Help from another person to put on and taking off regular lower body clothing?: A Lot 6 Click Score: 17    End of Session Equipment Utilized During Treatment: Gait  belt;Rolling walker (2 wheels);Other (comment)  OT Visit Diagnosis: Unsteadiness on feet (R26.81);Muscle weakness (generalized) (M62.81)   Activity Tolerance Patient tolerated treatment well   Patient Left in chair;with call bell/phone within reach;with chair alarm set   Nurse Communication Mobility status        Time: 7846-9629 OT Time Calculation (min): 35 min  Charges: OT General Charges $OT Visit: 1 Visit OT Treatments $Self Care/Home Management : 23-37 mins  Warnie Belair OT/L Acute Rehabilitation Department  [phone]  11/15/2023, 12:52 PM

## 2023-11-15 NOTE — Progress Notes (Signed)
 Patient to transfer to 1303. Report called to Shelagh Derrick, RN.

## 2023-11-15 NOTE — Progress Notes (Signed)
 PHARMACY - ANTICOAGULATION CONSULT NOTE  Pharmacy Consult for IV heparin  + warfarin Indication: History of mech aortic valve   No Known Allergies  Patient Measurements: Height: 5\' 2"  (157.5 cm) Weight: 78.2 kg (172 lb 6.4 oz) IBW/kg (Calculated) : 50.1 HEPARIN  DW (KG): 67.3  Vital Signs: Temp: 98.4 F (36.9 C) (05/05 0548) Temp Source: Oral (05/05 0548) BP: 119/51 (05/05 0548) Pulse Rate: 85 (05/05 0548)  Labs: Recent Labs    11/13/23 0536 11/13/23 1116 11/14/23 0543 11/14/23 1247 11/14/23 2146 11/15/23 0458  HGB 11.8*  --  11.1*  --   --  9.9*  HCT 37.0  --  35.2*  --   --  30.7*  PLT 228  --  221  --   --  225  LABPROT 15.5*  --  19.2*  --   --  20.8*  INR 1.2  --  1.6*  --   --  1.8*  HEPARINUNFRC 0.42   < > 0.27* 0.27* 0.36 0.30  CREATININE 1.42*  --  1.70*  --   --  1.22*   < > = values in this interval not displayed.    Estimated Creatinine Clearance: 43.9 mL/min (A) (by C-G formula based on SCr of 1.22 mg/dL (H)).  Medications:  Medications Prior to Admission  Medication Sig Dispense Refill   acetaminophen  (TYLENOL ) 500 MG tablet Take 500-1,000 mg by mouth every 6 (six) hours as needed for headache, moderate pain (pain score 4-6) or mild pain (pain score 1-3) (Sleep).     allopurinol  (ZYLOPRIM ) 100 MG tablet Take 100 mg by mouth daily.     Calcium  Carbonate-Vitamin D 600-400 MG-UNIT tablet Take 1 tablet by mouth 2 (two) times daily.     colchicine  0.6 MG tablet Take 1 tablet (0.6 mg total) by mouth daily for 3 doses. Take 2 tablets right away, take the 2nd dose 1.5 hours later (Patient taking differently: Take 0.6 mg by mouth See admin instructions.  For flare-up Take 2 tablets right away, take the 2nd dose 1.5 hours later) 3 tablet 0   furosemide  (LASIX ) 20 MG tablet Take 20 mg by mouth daily.  5   levothyroxine  (SYNTHROID ) 75 MCG tablet Take 75 mcg by mouth daily before breakfast.     losartan  (COZAAR ) 25 MG tablet Take 50 mg by mouth daily.     Multiple  Vitamins-Minerals (PRESERVISION/LUTEIN PO) Take 1 capsule by mouth daily with breakfast.      naphazoline-glycerin (CLEAR EYES REDNESS) 0.012-0.25 % SOLN Place 1-2 drops into both eyes 4 (four) times daily as needed for eye irritation.     pantoprazole  (PROTONIX ) 40 MG tablet Take 40 mg by mouth daily.     rosuvastatin  (CRESTOR ) 20 MG tablet Take 20 mg by mouth daily.     warfarin (COUMADIN ) 3 MG tablet TAKE 1 TABLET DAILY EXCEPT 1/2 TABLET ON SUNDAYS OR AS DIRECTED BY ANTICOAGULATION CLINIC. 90 tablet 1    Assessment: Pharmacy consulted to manage IV heparin  for this 68 yo female who was on warfarin prior to admission for a mechanical aortic valve. Warfarin held for hernia repair surgery, completed 5/1. Resuming warfarin per Pharmacy on 5/2.  Prior anticoagulation: warfarin 3 mg daily, except 1.5 mg on Sunday; last dose 4/25. Reported to have a history of bleeding on Lovenox .  Today, 11/15/2023: Heparin  level 0.3 - therapeutic on heparin  at 1100 units/hr INR 1.8 - subtherapeutic but trending up Hgb trending down, plt stable No complications of therapy noted    Goal of  Therapy: Heparin  level 0.3-0.7 units/ml INR 2-3 (confirmed in [facility] clinic documentation) Monitor platelets by anticoagulation protocol: Yes   Plan: Continue heparin  infusion at 1100 units/hr Continue with home dose of warfarin - 3 mg today Daily INR, HL, and CBC Monitor for signs of bleeding or thrombosis Recommended duration of heparin -warfarin overlap is 5 days AND until at least 2 consecutive therapeutic INRs achieved  Lolita Rise, PharmD, BCPS Clinical Pharmacist 11/15/2023 7:11 AM

## 2023-11-15 NOTE — Plan of Care (Signed)

## 2023-11-15 NOTE — Progress Notes (Signed)
  Progress Note   Patient: Elizabeth Morton EAV:[id number] DOB: April 27, 1956 DOA: 11/08/2023     7 DOS: the patient was seen and examined on 11/15/2023 at 8:20AM      Brief hospital course: 68 y.o. F with hx CVA due to aortic aneurysm, on warfarin, hx mech aortic valve after stroke, CAD s/p CABG, and hypothyroidism who was admitted for heparin  bridge prior to elective hernia repair.    S/p laparoscopic hernia repair on 5/1, post op course complicated by hypoxia.     Assessment and Plan: * Recurrent ventral hernia S/p laparoscopic repair of ventral hernia with mesh 5/1 by Dr. Hershell Lose - Postop care, diet, analgesia, bowel regimen per general surgery     Acute respiratory failure with hypoxia (HCC) See note from 5/3, this appears to all be hypopnea due to abdominal pain - Wean oxygen as able - Incentive spirometer  Acute kidney injury on CKD stage IIIa Cr up to 1.7 yesterday with diuresis. Better today with fluids. - Resume Lasix  tomorrow - Hold losartan  - Trend creatinine  Hypertension Blood pressure soft - Hold losartan  and Lasix   Cerebrovascular disease Longterm use of warfarin anticoagulation See prior notes regarding thrombosis INR 1.8 today - Continue heparin  bridge - Continue warfarin  -Continue statin Hypothyroidism - Continue Levothyroxine           Subjective: Doing well, hungry, would like to advance diet, was dizzy yesterday, this is resolved today.     Physical Exam: BP (!) 119/51 (BP Location: Right Arm)   Pulse 85   Temp 98.4 F (36.9 C) (Oral)   Resp 17   Ht 5\' 2"  (1.575 m)   Wt 78.2 kg   SpO2 96%   BMI 31.53 kg/m   Elderly adult female, lying in bed, interactive and appropriate RRR, no murmurs, no peripheral edema Respiratory normal, lungs clear without rales or wheezes Abdomen soft, she has binder in place, she actually does not have much tenderness to palpation today Tension normal, affect normal, judgment insight appear  normal   Data Reviewed: Basic metabolic panel shows creatinine down to 1.2 CBC shows hemoglobin down to 9.9 INR 1.8 Potassium shows mild hypokalemia, supplemented  Family Communication:     Disposition: Status is: Inpatient         Author: Ephriam Hashimoto, MD 11/15/2023 9:18 AM  For on call review [email or web address].

## 2023-11-15 NOTE — Progress Notes (Signed)
 Physical Therapy Treatment Patient Details Name: Elizabeth Morton MRN: [id number] DOB: 06-27-1956 Today's Date: 11/15/2023   History of Present Illness Elizabeth Morton is a 68 y.o. F admitted to [facility] for heparin  bridge prior to elective hernia repair. S/p laparoscopic hernia repair on 5/1, post op course complicated by hypoxia.  PMH:CVA due to aortic aneurysm, on warfarin, hx mech aortic valve after stroke, CAD s/p CABG, and hypothyroidism    PT Comments  Pt just moved to room 1303 from 4th floor. AxO x 3 pleasant and motivated Lady.  Lives home with Spouse.  Was Indep. Driving. Assisted OOB. Self able with use of rail and partial "log roll". General transfer comment: good use of hands to steady self as well as cognition. General Gait Details: tolerated a functional distance with light lean on walker and RA 94% with dyspnea 2/4.  Left Pt in recliner.   Pt plans to D/C back home when medically cleared.  LPT rec HH PT and a walker.     If plan is discharge home, recommend the following: A little help with walking and/or transfers;A little help with bathing/dressing/bathroom;Help with stairs or ramp for entrance;Assistance with cooking/housework   Can travel by private Scientist, research (medical) walker (2 wheels)    Recommendations for Other Services       Precautions / Restrictions Precautions Precautions: Fall Precaution/Restrictions Comments: abdominal surgery Restrictions Weight Bearing Restrictions Per Provider Order: No     Mobility  Bed Mobility Overal bed mobility: Needs Assistance Bed Mobility: Supine to Sit     Supine to sit: Contact guard, HOB elevated, Used rails     General bed mobility comments: self able with use of rail and partial "log roll".    Transfers Overall transfer level: Needs assistance Equipment used: Rolling walker (2 wheels) Transfers: Sit to/from Stand Sit to Stand: Supervision, Contact guard assist           General  transfer comment: good use of hands to steady self as well as cognition.    Ambulation/Gait Ambulation/Gait assistance: Supervision, Contact guard assist Gait Distance (Feet): 75 Feet Assistive device: Rolling walker (2 wheels) Gait Pattern/deviations: Step-through pattern, Decreased stride length, Trunk flexed Gait velocity: decreased     General Gait Details: tolerated a functional distance with light lean on walker and RA 94% with dyspnea 2/4   Stairs             Wheelchair Mobility     Tilt Bed    Modified Rankin (Stroke Patients Only)       Balance                                            Communication Communication Communication: No apparent difficulties  Cognition Arousal: Alert Behavior During Therapy: WFL for tasks assessed/performed   PT - Cognitive impairments: No apparent impairments                       PT - Cognition Comments: AxO x 3 pleasant and motivated Lady.  Lives home with Spouse.  Was Indep. Following commands: Intact      Cueing Cueing Techniques: Verbal cues  Exercises      General Comments General comments (skin integrity, edema, etc.): ECT's reinfored with Handouts, abdominal binder in place      Pertinent Vitals/Pain Pain  Assessment Pain Assessment: Faces Faces Pain Scale: Hurts a little bit Pain Location: ABD Pain Descriptors / Indicators: Aching, Tender, Operative site guarding Pain Intervention(s): Monitored during session    Home Living                          Prior Function            PT Goals (current goals can now be found in the care plan section) Progress towards PT goals: Progressing toward goals    Frequency    Min 3X/week      PT Plan      Co-evaluation              AM-PAC PT "6 Clicks" Mobility   Outcome Measure  Help needed turning from your back to your side while in a flat bed without using bedrails?: A Little Help needed moving from  lying on your back to sitting on the side of a flat bed without using bedrails?: A Little Help needed moving to and from a bed to a chair (including a wheelchair)?: A Little Help needed standing up from a chair using your arms (e.g., wheelchair or bedside chair)?: A Little Help needed to walk in hospital room?: A Little Help needed climbing 3-5 steps with a railing? : A Little 6 Click Score: 18    End of Session Equipment Utilized During Treatment: Gait belt Activity Tolerance: Patient tolerated treatment well Patient left: in chair;with call bell/phone within reach;with chair alarm set Nurse Communication: Mobility status PT Visit Diagnosis: Difficulty in walking, not elsewhere classified (R26.2);Muscle weakness (generalized) (M62.81)     Time: 7829-5621 PT Time Calculation (min) (ACUTE ONLY): 28 min  Charges:    $Gait Training: 8-22 mins $Therapeutic Activity: 8-22 mins PT General Charges $$ ACUTE PT VISIT: 1 Visit                     Elizabeth Morton  PTA Acute  Rehabilitation Services Office M-F          [phone]

## 2023-11-15 NOTE — Progress Notes (Signed)
 11/15/2023  Elizabeth Morton [id number] [birth year]  CARE TEAM: PCP: Azalia Leo, MD  Outpatient Care Team: Patient Care Team: Azalia Leo, MD as PCP - General (Internal Medicine) Candyce Champagne, MD as Attending Physician (General Surgery) Elmyra Haggard, MD as Consulting Physician (Cardiology) Armbruster, Lendon Queen, MD as Consulting Physician (Gastroenterology)  Inpatient Treatment Team: Treatment Team:  Ephriam Hashimoto, MD Candyce Champagne, MD Verlin Glory, MD Keane Passe, RN Sinda Duel, RN [place], [place], NT [place], [place], RPH [place], [place], [place] Creta Dolin, RN Chesley Cost, [place]   Problem List:   Principal Problem:   Recurrent ventral hernia Active Problems:   Recurrent incisional hernia with incarceration   Hypertension   Hypothyroidism   S/P repair of ventral hernia Nov 2017   Acute respiratory failure with hypoxia (HCC)   Obesity   Stage 3a chronic kidney disease (HCC)   11/11/2023  POST-OPERATIVE DIAGNOSIS:  VENTRAL INCISIONAL RECURRENT AND INCARCERATED  Dimensions of hernia post-op:  18cm x 7cm   PROCEDURE:   LAPAROSCOPIC REPAIR OF ABDOMINAL HERNIA WITH MESH  (See OR Findings below) LAPAROSCOPIC LYSIS OF ADHESIONS REMOVAL OF OLD MESH  TAP BLOCK - BILATERAL   SURGEON:  Eddye Goodie, MD  OR FINDINGS: Umbilical supraumbilical recurrent incisional hernias with old disc of mesh partially herniated into the squishy hernia sac.  Incarcerated the large volume omentum and colon nearby.  Swiss cheese hernias within significant diastases recti with a dominant Subxiphoid hernia 5 x 4 cm containing falciform ligament and omentum. .   Type of ventral wall repair:  Laparoscopic underlay repair .with Primary repair of largest hernia Placement of mesh: Centrally intraperitoneal with edges tucked into RECTRORECTUS & preperitoneal space Name of mesh: Bard Ventralight dual sided (polypropylene / Seprafilm) Size of mesh:  33x27cm Orientation: Vertical Mesh overlap:  7cm  Assessment [facility] Stay = 7 days) 4 Days Post-Op    Recovering relatively well    Plan:  Tolerating soft diet.  Advance to heart healthy regular diet.  Multimodal pain control.  Diuresis as tolerated.  Would keep her on the dry side.  Defer to medicine service.    Will ask therapies to come by and help evaluate to help her transition out of bed and make sure she does not need rehab or at least home health care.  Tolerating liquids.  Advance diet.  Anticoagulation for her heart valve.  Defer to medicine.  Okay to start warfarin in the meantime and follow INRs.  Most likely can go home when INR is >2 and cleared by internal medicine since she has had numerous severe readmissions on Lovenox  bridges, hence the need for preadmission and postop monitoring.  -monitor electrolytes & replace as needed  Keep K>4, Mg>2, Phos>3  I updated the patient's status to the patient  Recommendations were made.  Questions were answered.  She expressed understanding & appreciation.  -Disposition:  Disposition:  The patient is from: Home Anticipate discharge to:  Home with Home Health Anticipated Date of Discharge is:  May 5,2025   Barriers to discharge:  Pending Clinical improvement (more likely than not), Therapy assessment & Recommendations pending, and Testing result pending  Patient currently is NOT MEDICALLY STABLE for discharge from the hospital from a surgery standpoint.      I reviewed nursing notes, hospitalist notes, last 24 h vitals and pain scores, last 48 h intake and output, last 24 h labs and trends, and last 24 h imaging results.  I  have reviewed this patient's available data, including medical history, events of note, test results, etc as part of my evaluation.   A significant portion of that time was spent in counseling. Care during the described time interval was provided by me.  This care required moderate level of  medical decision making.  11/15/2023    Subjective: (Chief complaint)  Migraine apnea but tolerating p.o.  Walks some.  No vomiting but likes to have emesis basin just in case she feels nauseated.  No major nausea in the last 24 hours  Objective:  Vital signs:  Vitals:   11/14/23 1317 11/14/23 1400 11/14/23 2116 11/15/23 0548  BP: (!) 93/45 (!) 113/58 (!) 120/109 (!) 119/51  Pulse: 92  96 85  Resp: 15 16 20 17   Temp: 98.8 F (37.1 C)  99.2 F (37.3 C) 98.4 F (36.9 C)  TempSrc: Oral  Oral Oral  SpO2: (!) 87%  95% 96%  Weight:      Height:        Last BM Date : 11/14/23  Intake/Output   Yesterday:  05/04 0701 - 05/05 0700 In: 360 [P.O.:360] Out: -  This shift:  No intake/output data recorded.  Bowel function:  Flatus: YES  BM:  No  Drain: (No drain)   Physical Exam:  General: Pt awake/alert in no acute distress Eyes: PERRL, normal EOM.  Sclera clear.  No icterus Neuro: CN II-XII intact w/o focal sensory/motor deficits. Lymph: No head/neck/groin lymphadenopathy Psych:  No delerium/psychosis/paranoia.  Oriented x 4 HENT: Normocephalic, Mucus membranes moist.  No thrush Neck: Supple, No tracheal deviation.  No obvious thyromegaly Chest: No pain to chest wall compression.  Good respiratory excursion.  No audible wheezing CV:  Pulses intact.  Regular rhythm.  No major extremity edema MS: Normal AROM mjr joints.  No obvious deformity  Abdomen: Soft.  Nondistended.  Nontender.  No evidence of peritonitis.  No incarcerated hernias.  Ext:   No deformity.  No mjr edema.  No cyanosis Skin: No petechiae / purpurea.  No major sores.  Warm and dry    Results:   Cultures: No results found for this or any previous visit (from the past 720 hours).  Labs: Results for orders placed or performed during the hospital encounter of 11/08/23 (from the past 48 hours)  Heparin  level (unfractionated)     Status: None   Collection Time: 11/13/23 11:16 AM  Result Value  Ref Range   Heparin  Unfractionated 0.35 0.30 - 0.70 IU/mL    Comment: (NOTE) The clinical reportable range upper limit is being lowered to >1.10 to align with the FDA approved guidance for the current laboratory assay.  If heparin  results are below expected values, and patient dosage has  been confirmed, suggest follow up testing of antithrombin III levels. Performed at [facility], [address] [place], [place] [address]   CBC     Status: Abnormal   Collection Time: 11/14/23  5:43 AM  Result Value Ref Range   WBC 9.0 4.0 - 10.5 K/uL   RBC 3.68 (L) 3.87 - 5.11 MIL/uL   Hemoglobin 11.1 (L) 12.0 - 15.0 g/dL   HCT 86.5 (L) 78.4 - 69.6 %   MCV 95.7 80.0 - 100.0 fL   MCH 30.2 26.0 - 34.0 pg   MCHC 31.5 30.0 - 36.0 g/dL   RDW 29.5 28.4 - 13.2 %   Platelets 221 150 - 400 K/uL   nRBC 0.0 0.0 - 0.2 %  Comment: Performed at [facility], [address], [place], [place] [address]  Basic metabolic panel with GFR     Status: Abnormal   Collection Time: 11/14/23  5:43 AM  Result Value Ref Range   Sodium 134 (L) 135 - 145 mmol/L   Potassium 4.0 3.5 - 5.1 mmol/L   Chloride 102 98 - 111 mmol/L   CO2 25 22 - 32 mmol/L   Glucose, Bld 113 (H) 70 - 99 mg/dL    Comment: Glucose reference range applies only to samples taken after fasting for at least 8 hours.   BUN 27 (H) 8 - 23 mg/dL   Creatinine, Ser 1.47 (H) 0.44 - 1.00 mg/dL   Calcium  9.2 8.9 - 10.3 mg/dL   GFR, Estimated 33 (L) >60 mL/min    Comment: (NOTE) Calculated using the CKD-EPI Creatinine Equation (2021)    Anion gap 7 5 - 15    Comment: Performed at [facility], [address], [place], [place] [address]  Protime-INR     Status: Abnormal   Collection Time: 11/14/23  5:43 AM  Result Value Ref Range   Prothrombin Time 19.2 (H) 11.4 - 15.2 seconds   INR 1.6 (H) 0.8 - 1.2    Comment: (NOTE) INR goal varies based on device and disease states. Performed at [facility], [address], [place], [place] [address]   Heparin  level (unfractionated)     Status: Abnormal   Collection Time: 11/14/23  5:43 AM  Result Value Ref Range   Heparin  Unfractionated 0.27 (L) 0.30 - 0.70 IU/mL    Comment: (NOTE) The clinical reportable range upper limit is being lowered to >1.10 to align with the FDA approved guidance for the current laboratory assay.  If heparin  results are below expected values, and patient dosage has  been confirmed, suggest follow up testing of antithrombin III levels. Performed at [facility], [address] [place], [place] [address]   Heparin  level (unfractionated)     Status: Abnormal   Collection Time: 11/14/23 12:47 PM  Result Value Ref Range   Heparin  Unfractionated 0.27 (L) 0.30 - 0.70 IU/mL    Comment: (NOTE) The clinical reportable range upper limit is being lowered to >1.10 to align with the FDA approved guidance for the current laboratory assay.  If heparin  results are below expected values, and patient dosage has  been confirmed, suggest follow up testing of antithrombin III levels. Performed at [facility], [address], [place], [place] [address]   Heparin  level (unfractionated)     Status: None   Collection Time: 11/14/23  9:46 PM  Result Value Ref Range   Heparin  Unfractionated 0.36 0.30 - 0.70 IU/mL    Comment: (NOTE) The clinical reportable range upper limit is being lowered to >1.10 to align with the FDA approved guidance for the current laboratory assay.  If heparin  results are below expected values, and patient dosage has  been confirmed, suggest follow up testing of antithrombin III levels. Performed at [facility], [address] [place], [place] [address]   CBC     Status: Abnormal   Collection Time: 11/15/23  4:58 AM  Result Value Ref Range   WBC 7.2 4.0 - 10.5 K/uL   RBC 3.26 (L) 3.87 - 5.11 MIL/uL   Hemoglobin 9.9  (L) 12.0 - 15.0 g/dL   HCT 32.4 (L) 40.1 - 02.7 %   MCV 94.2 80.0 - 100.0 fL  MCH 30.4 26.0 - 34.0 pg   MCHC 32.2 30.0 - 36.0 g/dL   RDW 91.4 78.2 - 95.6 %   Platelets 225 150 - 400 K/uL   nRBC 0.0 0.0 - 0.2 %    Comment: Performed at [facility], [address] [place], [place] [address]  Basic metabolic panel with GFR     Status: Abnormal   Collection Time: 11/15/23  4:58 AM  Result Value Ref Range   Sodium 135 135 - 145 mmol/L   Potassium 3.3 (L) 3.5 - 5.1 mmol/L   Chloride 106 98 - 111 mmol/L   CO2 22 22 - 32 mmol/L   Glucose, Bld 112 (H) 70 - 99 mg/dL    Comment: Glucose reference range applies only to samples taken after fasting for at least 8 hours.   BUN 23 8 - 23 mg/dL   Creatinine, Ser 6.57 (H) 0.44 - 1.00 mg/dL   Calcium  8.2 (L) 8.9 - 10.3 mg/dL   GFR, Estimated 49 (L) >60 mL/min    Comment: (NOTE) Calculated using the CKD-EPI Creatinine Equation (2021)    Anion gap 7 5 - 15    Comment: Performed at [facility], [address] [place], [place] [address]  Protime-INR     Status: Abnormal   Collection Time: 11/15/23  4:58 AM  Result Value Ref Range   Prothrombin Time 20.8 (H) 11.4 - 15.2 seconds   INR 1.8 (H) 0.8 - 1.2    Comment: (NOTE) INR goal varies based on device and disease states. Performed at [facility], [address], [place], [place] [address]   Heparin  level (unfractionated)     Status: None   Collection Time: 11/15/23  4:58 AM  Result Value Ref Range   Heparin  Unfractionated 0.30 0.30 - 0.70 IU/mL    Comment: (NOTE) The clinical reportable range upper limit is being lowered to >1.10 to align with the FDA approved guidance for the current laboratory assay.  If heparin  results are below expected values, and patient dosage has  been confirmed, suggest follow up testing of antithrombin III levels. Performed at [facility], [address], [place], [place] [address]      Imaging / Studies: No results found.   Medications / Allergies: per chart  Antibiotics: Anti-infectives (From admission, onward)    Start     Dose/Rate Route Frequency Ordered Stop   11/11/23 2200  ceFAZolin  (ANCEF ) IVPB 2g/100 mL premix        2 g 200 mL/hr over 30 Minutes Intravenous Every 8 hours 11/11/23 1930 11/12/23 1356   11/11/23 1045  ceFAZolin  (ANCEF ) IVPB 2g/100 mL premix        2 g 200 mL/hr over 30 Minutes Intravenous On call to O.R. 11/09/23 0748 11/11/23 1021   11/11/23 1033  ceFAZolin  (ANCEF ) 2-4 GM/100ML-% IVPB       Note to Pharmacy: Aloha Arnold D: cabinet override      11/11/23 1033 11/11/23 2244         Note: Portions of this report may have been transcribed using voice recognition software. Every effort was made to ensure accuracy; however, inadvertent computerized transcription errors may be present.   Any transcriptional errors that result from this process are unintentional.    Eddye Goodie, MD, FACS, MASCRS Esophageal, Gastrointestinal & Colorectal Surgery Robotic and Minimally Invasive Surgery  [facility] [address], [address] [place], [place] [address] [phone]  [phone] Fax [phone] Main  CONTACT INFORMATION: Weekday (9AM-5PM): Call [facility] main office at [phone] Weeknight (5PM-9AM) or Weekend/Holiday: Check EPIC "Web Links" tab & use "AMION" (password " [email or web address]") for General Surgery [facility] coverage  Please, DO NOT use SecureChat  (it is not reliable communication to reach operating surgeons & will lead to a delay in care).   Epic staff messaging available for outptient concerns needing 1-2 business day response.      11/15/2023  7:53 AM

## 2023-11-16 ENCOUNTER — Other Ambulatory Visit (HOSPITAL_COMMUNITY): Payer: Self-pay

## 2023-11-16 DIAGNOSIS — K432 Incisional hernia without obstruction or gangrene: Secondary | ICD-10-CM | POA: Diagnosis not present

## 2023-11-16 LAB — CBC
HCT: 33.6 % — ABNORMAL LOW (ref 36.0–46.0)
Hemoglobin: 10.3 g/dL — ABNORMAL LOW (ref 12.0–15.0)
MCH: 29.7 pg (ref 26.0–34.0)
MCHC: 30.7 g/dL (ref 30.0–36.0)
MCV: 96.8 fL (ref 80.0–100.0)
Platelets: 274 10*3/uL (ref 150–400)
RBC: 3.47 MIL/uL — ABNORMAL LOW (ref 3.87–5.11)
RDW: 14.7 % (ref 11.5–15.5)
WBC: 6.7 10*3/uL (ref 4.0–10.5)
nRBC: 0 % (ref 0.0–0.2)

## 2023-11-16 LAB — BASIC METABOLIC PANEL WITH GFR
Anion gap: 8 (ref 5–15)
BUN: 25 mg/dL — ABNORMAL HIGH (ref 8–23)
CO2: 22 mmol/L (ref 22–32)
Calcium: 9 mg/dL (ref 8.9–10.3)
Chloride: 103 mmol/L (ref 98–111)
Creatinine, Ser: 1.25 mg/dL — ABNORMAL HIGH (ref 0.44–1.00)
GFR, Estimated: 48 mL/min — ABNORMAL LOW (ref >60)
Glucose, Bld: 102 mg/dL — ABNORMAL HIGH (ref 70–99)
Potassium: 4.1 mmol/L (ref 3.5–5.1)
Sodium: 133 mmol/L — ABNORMAL LOW (ref 135–145)

## 2023-11-16 LAB — HEPARIN LEVEL (UNFRACTIONATED): Heparin Unfractionated: 0.43 [IU]/mL (ref 0.30–0.70)

## 2023-11-16 LAB — PROTIME-INR
INR: 2 — ABNORMAL HIGH (ref 0.8–1.2)
Prothrombin Time: 22.7 s — ABNORMAL HIGH (ref 11.4–15.2)

## 2023-11-16 MED ORDER — CYCLOBENZAPRINE HCL 5 MG PO TABS
5.0000 mg | ORAL_TABLET | Freq: Three times a day (TID) | ORAL | 1 refills | Status: AC | PRN
Start: 2023-11-16 — End: ?
  Filled 2023-11-16: qty 20, 7d supply, fill #0

## 2023-11-16 MED ORDER — WARFARIN SODIUM 3 MG PO TABS
3.0000 mg | ORAL_TABLET | Freq: Once | ORAL | Status: DC
  Filled 2023-11-16: qty 1

## 2023-11-16 MED ORDER — OXYCODONE HCL 5 MG PO TABS
5.0000 mg | ORAL_TABLET | ORAL | 0 refills | Status: AC | PRN
Start: 2023-11-16 — End: ?
  Filled 2023-11-16: qty 30, 3d supply, fill #0

## 2023-11-16 NOTE — Progress Notes (Signed)
 TOC med in a secure bag delivered to pt in room by this RN- waiting on discharge med rec to print out AVS

## 2023-11-16 NOTE — Discharge Summary (Signed)
 Physician Discharge Summary   Patient: Elizabeth Morton MRN: [id number] DOB: 05-27-1956  Admit date:     11/08/2023  Discharge date: 11/16/23  Discharge Physician: Ephriam Hashimoto   PCP: Azalia Leo, MD     Recommendations at discharge:  Follow up with Dr. Hershell Lose for post-surgical care for hernia repair Follow up INR tomorrow  Follow up with Dr. Charolet Cope in 2-3 weeks     Discharge Diagnoses: Principal Problem:   Recurrent ventral hernia Active Problems:   Acute respiratory failure post-anesthesia due to hypoventilation   Stage 3a chronic kidney disease (HCC)   Hypertension   Hypothyroidism   S/P repair of ventral hernia Nov 2017   Recurrent incisional hernia with incarceration   Obesity        Hospital Course: 68 y.o. F with hx CVA due to aortic aneurysm, on warfarin, hx mech aortic valve after stroke, CAD s/p CABG, and hypothyroidism who was admitted for heparin  bridging perioperatively to elective hernia repair.  S/p laparoscopic hernia repair on 5/1, post op course complicated by hypoxia.      * Recurrent ventral hernia S/p laparoscopic repair of ventral hernia with mesh 5/1 by Dr. Hershell Lose Patient was admitted, started on heparin , warfarin held.  She underwent an uncomplicated ventral hernia repair on 5/1 by Dr. Hershell Lose.  Postoperative course complicated by greater than expected hypoxia, but this gradually resolved.  Bowel function resumed well.  Pain controlled.  Has post-op follow up with Surgery.      Acute respiratory failure with hypoxia (HCC) Postop the patient became very hypoxic requiring a nonrebreather.  ABG was normal, chest x-ray was clear.  She had no wheezing.  She appeared to respiratory failure due to hypopnea from abdominal pain, likely exacerbated by some underlying obesity hypoventilation.  With pain control, incentive spirometer, her breathing resolved.  Do not suspect pneumonia, aspiration, edema.    Stage 3a chronic kidney disease  (HCC) Mild AKI Patient was treated with some IV Lasix  postoperatively when she had hypoxia.  She did develop a mild AKI, which resolved with holding Lasix  for 2 days.  Creatinine resolved by the time of discharge.  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 Has been on warfarin since then.   Aortic root subsequently repaired, has mechanical aortic valve and CABG.   Here the patient was treated with heparin  bridging.  Her INR was 2 prior to discharge and she was transitioned back to warfarin.  She has an INR check tomorrow.  She was discharged on her home warfarin dose, 3 mg daily with 1.5 mg on Sunday.            The [place]  Controlled Substances Registry was reviewed for this patient prior to discharge.  Consultants: General Surgery  Procedures performed: Ventral hernia repair  Disposition: Home Diet recommendation:  Discharge Diet Orders (From admission, onward)     Start     Ordered   11/11/23 0000  Diet - low sodium heart healthy       Comments: Start with a bland diet such as soups, liquids, starchy foods, low fat foods, etc. the first few days at home. Gradually advance to a solid, low-fat, high fiber diet by the end of the first week at home.   Add a fiber supplement to your diet (Metamucil, etc) If you feel full, bloated, or constipated, stay on a full liquid or pureed/blenderized diet for a  few days until you feel better and are no longer constipated.   11/11/23 1452             DISCHARGE MEDICATION: Allergies as of 11/16/2023   No Known Allergies      Medication List     TAKE these medications    acetaminophen  500 MG tablet Commonly known as: TYLENOL  Take 500-1,000 mg by mouth every 6 (six) hours as needed for headache, moderate pain (pain score 4-6) or mild pain (pain score 1-3) (Sleep).   allopurinol  100 MG  tablet Commonly known as: ZYLOPRIM  Take 100 mg by mouth daily.   Calcium  Carbonate-Vitamin D 600-400 MG-UNIT tablet Take 1 tablet by mouth 2 (two) times daily.   colchicine  0.6 MG tablet Take 1 tablet (0.6 mg total) by mouth daily for 3 doses. Take 2 tablets right away, take the 2nd dose 1.5 hours later What changed:  when to take this additional instructions   cyclobenzaprine 5 MG tablet Commonly known as: FLEXERIL Take 1 tablet (5 mg total) by mouth 3 (three) times daily as needed for muscle spasms.   furosemide  20 MG tablet Commonly known as: LASIX  Take 20 mg by mouth daily.   levothyroxine  75 MCG tablet Commonly known as: SYNTHROID  Take 75 mcg by mouth daily before breakfast.   losartan  25 MG tablet Commonly known as: COZAAR  Take 50 mg by mouth daily.   naphazoline-glycerin 0.012-0.25 % Soln Commonly known as: CLEAR EYES REDNESS Place 1-2 drops into both eyes 4 (four) times daily as needed for eye irritation.   oxyCODONE  5 MG immediate release tablet Commonly known as: Oxy IR/ROXICODONE  Take 1-2 tablets (5-10 mg total) by mouth every 4 (four) hours as needed for moderate pain (pain score 4-6), severe pain (pain score 7-10) or breakthrough pain (5mg  for moderate pain, 10mg  for severe pain).   pantoprazole  40 MG tablet Commonly known as: PROTONIX  Take 40 mg by mouth daily.   PRESERVISION/LUTEIN PO Take 1 capsule by mouth daily with breakfast.   rosuvastatin  20 MG tablet Commonly known as: CRESTOR  Take 20 mg by mouth daily.   warfarin 3 MG tablet Commonly known as: COUMADIN  Take as directed. If you are unsure how to take this medication, talk to your nurse or doctor. Original instructions: TAKE 1 TABLET DAILY EXCEPT 1/2 TABLET ON SUNDAYS OR AS DIRECTED BY ANTICOAGULATION CLINIC.               Durable Medical Equipment  (From admission, onward)           Start     Ordered   11/16/23 0721  For home use only DME Walker rolling  Once       Question  Answer Comment  Walker: With 5 Inch Wheels   Patient needs a walker to treat with the following condition Balance problem      05 /06/25 0720              Discharge Care Instructions  (From admission, onward)           Start     Ordered   11/11/23 0000  Discharge wound care:       Comments: It is good for closed incisions and even open wounds to be washed every day.  Shower every day.  Short baths are fine.  Wash the incisions and wounds clean with soap & water .    You may leave closed incisions open to air if it is dry.   You may cover the incision with  clean gauze & replace it after your daily shower for comfort.  TEGADERM & STERISTRIPS:  You have clear gauze band-aid dressings over your closed incision(s).  You also have skin tapes called Steristrips on your incisions.  Leave these in place.  You may trim any edges that curl up with clean scissors.  You may remove all dressings & tapes in the shower POD#2 = 5/3 Saturday.   11/11/23 1452            Follow-up Information     Candyce Champagne, MD. Schedule an appointment as soon as possible for a visit on 12/13/2023.   Specialties: General Surgery, Colon and Rectal Surgery Why: To follow up after your operation        Avanell Leigh, MD. Call.   Specialties: Cardiology, Radiology Why: To have your PT/INR checked & review your meds Contact information: [address] [place] [address] [phone]         Azalia Leo, MD. Schedule an appointment as soon as possible for a visit in 1 week(s).   Specialty: Internal Medicine Contact information: [address] [place] [address] [phone]                 Discharge Instructions     Call MD for:   Complete by: As directed    FEVER > 101.5 F  (temperatures < 101.5 F are not significant)   Call MD for:  extreme fatigue   Complete by: As directed    Call MD for:  persistant dizziness or light-headedness   Complete by: As directed     Call MD for:  persistant nausea and vomiting   Complete by: As directed    Call MD for:  redness, tenderness, or signs of infection (pain, swelling, redness, odor or green/yellow discharge around incision site)   Complete by: As directed    Call MD for:  severe uncontrolled pain   Complete by: As directed    Diet - low sodium heart healthy   Complete by: As directed    Start with a bland diet such as soups, liquids, starchy foods, low fat foods, etc. the first few days at home. Gradually advance to a solid, low-fat, high fiber diet by the end of the first week at home.   Add a fiber supplement to your diet (Metamucil, etc) If you feel full, bloated, or constipated, stay on a full liquid or pureed/blenderized diet for a few days until you feel better and are no longer constipated.   Discharge instructions   Complete by: As directed    See Discharge Instructions If you are not getting better after two weeks or are noticing you are getting worse, contact our office [phone] for further advice.  We may need to adjust your medications, re-evaluate you in the office, send you to the emergency room, or see what other things we can do to help. The clinic staff is available to answer your questions during regular business hours (8:30am-5pm).  Please don't hesitate to call and ask to speak to one of our nurses for clinical concerns.    A surgeon from [facility] is always on call at the hospitals 24 hours/day If you have a medical emergency, go to the nearest emergency room or call 911.   Discharge wound care:   Complete by: As directed    It is good for closed incisions and even open wounds to be washed every day.  Shower every day.  Short baths are fine.  Wash the incisions and wounds clean with soap & water .    You may leave closed incisions open to air if it is dry.   You may cover the incision with clean gauze & replace it after your daily shower for comfort.  TEGADERM &  STERISTRIPS:  You have clear gauze band-aid dressings over your closed incision(s).  You also have skin tapes called Steristrips on your incisions.  Leave these in place.  You may trim any edges that curl up with clean scissors.  You may remove all dressings & tapes in the shower POD#2 = 5/3 Saturday.   Driving Restrictions   Complete by: As directed    You may drive when: - you are no longer taking narcotic prescription pain medication - you can comfortably wear a seatbelt - you can safely make sudden turns/stops without pain.   Increase activity slowly   Complete by: As directed    Start light daily activities --- self-care, walking, climbing stairs- beginning the day after surgery.  Gradually increase activities as tolerated.  Control your pain to be active.  Stop when you are tired.  Ideally, walk several times a day, eventually an hour a day.   Most people are back to most day-to-day activities in a few weeks.  It takes 4-6 weeks to get back to unrestricted, intense activity. If you can walk 30 minutes without difficulty, it is safe to try more intense activity such as jogging, treadmill, bicycling, low-impact aerobics, swimming, etc. Save the most intensive and strenuous activity for last (Usually 4-8 weeks after surgery) such as sit-ups, heavy lifting, contact sports, etc.  Refrain from any intense heavy lifting or straining until you are off narcotics for pain control.  You will have off days, but things should improve week-by-week. DO NOT PUSH THROUGH PAIN.  Let pain be your guide: If it hurts to do something, don't do it.   Lifting restrictions   Complete by: As directed    If you can walk 30 minutes without difficulty, it is safe to try more intense activity such as jogging, treadmill, bicycling, low-impact aerobics, swimming, etc. Save the most intensive and strenuous activity for last (Usually 4-8 weeks after surgery) such as sit-ups, heavy lifting, contact sports, etc.   Refrain from  any intense heavy lifting or straining until you are off narcotics for pain control.  You will have off days, but things should improve week-by-week. DO NOT PUSH THROUGH PAIN.  Let pain be your guide: If it hurts to do something, don't do it.  Pain is your body warning you to avoid that activity for another week until the pain goes down.   May shower / Bathe   Complete by: As directed    May walk up steps   Complete by: As directed    Remove dressing in 48 hours   Complete by: As directed    Make sure all dressings have been removed on the second day after surgery = 5/3 Saturday Leave incisions open to air.  OK to cover incisions with gauze or bandages as desired   Sexual Activity Restrictions   Complete by: As directed    You may have sexual intercourse when it is comfortable. If it hurts to do something, stop.       Discharge Exam: Filed Weights   11/08/23 1617  Weight: 78.2 kg    General: Pt is alert, awake, not in acute distress Cardiovascular: RRR, nl S1-S2, no murmurs  appreciated.   No LE edema.   Respiratory: Normal respiratory rate and rhythm.  CTAB without rales or wheezes. Abdominal: Abdomen soft and mildly tender.  No distension or HSM.   Neuro/Psych: Strength symmetric in upper and lower extremities.  Judgment and insight appear normal.   Condition at discharge: Good  The results of significant diagnostics from this hospitalization (including imaging, microbiology, ancillary and laboratory) are listed below for reference.   Imaging Studies: DG CHEST PORT 1 VIEW Result Date: 11/11/2023 EXAM: 1 VIEW XRAY OF THE CHEST 11/11/2023 06:13:00 PM COMPARISON: 01/17/2019. CLINICAL HISTORY: 200808 Hypoxia [id number]. Reason for exam: hypoxia FINDINGS: LUNGS AND PLEURA: Low lung volumes with suspected small bilateral pleural effusions. HEART AND MEDIASTINUM: Prosthetic valve. Postsurgical changes related to prior CABG. Median sternotomy. BONES AND SOFT TISSUES: No acute osseous  abnormality. IMPRESSION: 1. Suspected small bilateral pleural effusions. Electronically signed by: Zadie Herter MD 11/11/2023 08:33 PM EDT RP Workstation: [id number]    Microbiology: Results for orders placed or performed during the hospital encounter of 01/17/19  Culture, blood (Routine x 2)     Status: Abnormal   Collection Time: 01/17/19  9:00 PM   Specimen: BLOOD  Result Value Ref Range Status   Specimen Description BLOOD RIGHT HAND  Final   Special Requests   Final    BOTTLES DRAWN AEROBIC ONLY Blood Culture adequate volume   Culture  Setup Time   Final    GRAM POSITIVE COCCI IN CLUSTERS AEROBIC BOTTLE ONLY CRITICAL VALUE NOTED.  VALUE IS CONSISTENT WITH PREVIOUSLY REPORTED AND CALLED VALUE.    Culture (A)  Final    STAPHYLOCOCCUS AUREUS SUSCEPTIBILITIES PERFORMED ON PREVIOUS CULTURE WITHIN THE LAST 5 DAYS. Performed at [facility], [address] [place], [place] [address]    Report Status 01/20/2019 FINAL  Final  Culture, blood (Routine x 2)     Status: Abnormal   Collection Time: 01/17/19  9:08 PM   Specimen: BLOOD  Result Value Ref Range Status   Specimen Description BLOOD RIGHT ARM  Final   Special Requests   Final    BOTTLES DRAWN AEROBIC AND ANAEROBIC Blood Culture adequate volume   Culture  Setup Time   Final    GRAM POSITIVE COCCI IN CLUSTERS IN BOTH AEROBIC AND ANAEROBIC BOTTLES CRITICAL RESULT CALLED TO, READ BACK BY AND VERIFIED WITH: Landa Pine PharmD 10:15 01/18/19 ([email or web address]) Performed at [facility], [address], [place], [place] [address]    Culture STAPHYLOCOCCUS AUREUS (A)  Final   Report Status 01/20/2019 FINAL  Final   Organism ID, Bacteria STAPHYLOCOCCUS AUREUS  Final      Susceptibility   Staphylococcus aureus - MIC*    CIPROFLOXACIN <=0.5 SENSITIVE Sensitive     ERYTHROMYCIN <=0.25 SENSITIVE Sensitive     GENTAMICIN  <=0.5 SENSITIVE Sensitive     OXACILLIN <=0.25 SENSITIVE Sensitive     TETRACYCLINE <=1 SENSITIVE Sensitive      VANCOMYCIN  <=0.5 SENSITIVE Sensitive     TRIMETH/SULFA <=10 SENSITIVE Sensitive     CLINDAMYCIN <=0.25 SENSITIVE Sensitive     RIFAMPIN <=0.5 SENSITIVE Sensitive     Inducible Clindamycin NEGATIVE Sensitive     * STAPHYLOCOCCUS AUREUS  Blood Culture ID Panel (Reflexed)     Status: Abnormal   Collection Time: 01/17/19  9:08 PM  Result Value Ref Range Status   Enterococcus species NOT DETECTED NOT DETECTED Final   Listeria monocytogenes NOT DETECTED NOT DETECTED Final   Staphylococcus species DETECTED (A) NOT DETECTED Final  Comment: CRITICAL RESULT CALLED TO, READ BACK BY AND VERIFIED WITH: Landa Pine PharmD 10:15 01/18/19 ([email or web address])    Staphylococcus aureus (BCID) DETECTED (A) NOT DETECTED Final    Comment: Methicillin (oxacillin) susceptible Staphylococcus aureus (MSSA). Preferred therapy is anti staphylococcal beta lactam antibiotic (Cefazolin  or Nafcillin), unless clinically contraindicated. CRITICAL RESULT CALLED TO, READ BACK BY AND VERIFIED WITH: Landa Pine PharmD 10:15 01/18/19 ([email or web address])    Methicillin resistance NOT DETECTED NOT DETECTED Final   Streptococcus species NOT DETECTED NOT DETECTED Final   Streptococcus agalactiae NOT DETECTED NOT DETECTED Final   Streptococcus pneumoniae NOT DETECTED NOT DETECTED Final   Streptococcus pyogenes NOT DETECTED NOT DETECTED Final   Acinetobacter baumannii NOT DETECTED NOT DETECTED Final   Enterobacteriaceae species NOT DETECTED NOT DETECTED Final   Enterobacter cloacae complex NOT DETECTED NOT DETECTED Final   Escherichia coli NOT DETECTED NOT DETECTED Final   Klebsiella oxytoca NOT DETECTED NOT DETECTED Final   Klebsiella pneumoniae NOT DETECTED NOT DETECTED Final   Proteus species NOT DETECTED NOT DETECTED Final   Serratia marcescens NOT DETECTED NOT DETECTED Final   Haemophilus influenzae NOT DETECTED NOT DETECTED Final   Neisseria meningitidis NOT DETECTED NOT DETECTED Final   Pseudomonas aeruginosa NOT DETECTED NOT  DETECTED Final   Candida albicans NOT DETECTED NOT DETECTED Final   Candida glabrata NOT DETECTED NOT DETECTED Final   Candida krusei NOT DETECTED NOT DETECTED Final   Candida parapsilosis NOT DETECTED NOT DETECTED Final   Candida tropicalis NOT DETECTED NOT DETECTED Final    Comment: Performed at [facility], [address], [place], [place] [address]  SARS Coronavirus 2 (CEPHEID- Performed in [facility] hospital lab), Hosp Order     Status: None   Collection Time: 01/18/19  2:02 AM   Specimen: Nasopharyngeal Swab  Result Value Ref Range Status   SARS Coronavirus 2 NEGATIVE NEGATIVE Final    Comment: (NOTE) If result is NEGATIVE SARS-CoV-2 target nucleic acids are NOT DETECTED. The SARS-CoV-2 RNA is generally detectable in upper and lower  respiratory specimens during the acute phase of infection. The lowest  concentration of SARS-CoV-2 viral copies this assay can detect is 250  copies / mL. A negative result does not preclude SARS-CoV-2 infection  and should not be used as the sole basis for treatment or other  patient management decisions.  A negative result may occur with  improper specimen collection / handling, submission of specimen other  than nasopharyngeal swab, presence of viral mutation(s) within the  areas targeted by this assay, and inadequate number of viral copies  (<250 copies / mL). A negative result must be combined with clinical  observations, patient history, and epidemiological information. If result is POSITIVE SARS-CoV-2 target nucleic acids are DETECTED. The SARS-CoV-2 RNA is generally detectable in upper and lower  respiratory specimens dur ing the acute phase of infection.  Positive  results are indicative of active infection with SARS-CoV-2.  Clinical  correlation with patient history and other diagnostic information is  necessary to determine patient infection status.  Positive results do  not rule out bacterial infection or co-infection with  other viruses. If result is PRESUMPTIVE POSTIVE SARS-CoV-2 nucleic acids MAY BE PRESENT.   A presumptive positive result was obtained on the submitted specimen  and confirmed on repeat testing.  While 2019 novel coronavirus  (SARS-CoV-2) nucleic acids may be present in the submitted sample  additional confirmatory testing may be necessary for epidemiological  and / or clinical management purposes  to  differentiate between  SARS-CoV-2 and other Sarbecovirus currently known to infect humans.  If clinically indicated additional testing with an alternate test  methodology [phone]) is advised. The SARS-CoV-2 RNA is generally  detectable in upper and lower respiratory sp ecimens during the acute  phase of infection. The expected result is Negative. Fact Sheet for Patients:  BoilerBrush.com.cy Fact Sheet for Healthcare Providers: https://pope.com/ This test is not yet approved or cleared by the [place]  FDA and has been authorized for detection and/or diagnosis of SARS-CoV-2 by FDA under an Emergency Use Authorization (EUA).  This EUA will remain in effect (meaning this test can be used) for the duration of the COVID-19 declaration under Section 564(b)(1) of the Act, 21 U.S.C. section 360bbb-3(b)(1), unless the authorization is terminated or revoked sooner. Performed at [facility], [address], [place], [place] [address]   Culture, blood (routine x 2)     Status: None   Collection Time: 01/19/19  6:15 AM   Specimen: BLOOD  Result Value Ref Range Status   Specimen Description BLOOD RIGHT ANTECUBITAL  Final   Special Requests   Final    BOTTLES DRAWN AEROBIC AND ANAEROBIC Blood Culture adequate volume   Culture   Final    NO GROWTH 5 DAYS Performed at [facility], [address], [place], [place] [address]    Report Status 01/24/2019 FINAL  Final  Culture, blood (routine x 2)     Status: None   Collection Time:  01/19/19  6:24 AM   Specimen: BLOOD RIGHT HAND  Result Value Ref Range Status   Specimen Description BLOOD RIGHT HAND  Final   Special Requests   Final    BOTTLES DRAWN AEROBIC AND ANAEROBIC Blood Culture adequate volume   Culture   Final    NO GROWTH 5 DAYS Performed at [facility], [address], [place], [place] [address]    Report Status 01/24/2019 FINAL  Final  MRSA PCR Screening     Status: None   Collection Time: 01/20/19  7:07 AM   Specimen: Nasal Mucosa; Nasopharyngeal  Result Value Ref Range Status   MRSA by PCR NEGATIVE NEGATIVE Final    Comment:        The GeneXpert MRSA Assay (FDA approved for NASAL specimens only), is one component of a comprehensive MRSA colonization surveillance program. It is not intended to diagnose MRSA infection nor to guide or monitor treatment for MRSA infections. Performed at [facility], [address] [place], [place] [address]     Labs: CBC: Recent Labs  Lab 11/12/23 [phone] 11/13/23 0536 11/14/23 0543 11/15/23 0458 11/16/23 0514  WBC 8.0 8.8 9.0 7.2 6.7  HGB 11.4* 11.8* 11.1* 9.9* 10.3*  HCT 35.2* 37.0 35.2* 30.7* 33.6*  MCV 91.9 94.1 95.7 94.2 96.8  PLT 213 228 221 225 274   Basic Metabolic Panel: Recent Labs  Lab 11/12/23 0504 11/13/23 0536 11/14/23 0543 11/15/23 0458 11/16/23 0514  NA 135 135 134* 135 133*  K 3.9 4.1 4.0 3.3* 4.1  CL 105 101 102 106 103  CO2 21* 26 25 22 22   GLUCOSE 141* 118* 113* 112* 102*  BUN 17 20 27* 23 25*  CREATININE 1.16* 1.42* 1.70* 1.22* 1.25*  CALCIUM  8.7* 9.3 9.2 8.2* 9.0   Liver Function Tests: No results for input(s): "AST", "ALT", "ALKPHOS", "BILITOT", "PROT", "ALBUMIN " in the last 168 hours. CBG: No results for input(s): "GLUCAP" in the last 168 hours.  Discharge time  spent: approximately 45 minutes spent on discharge counseling, evaluation of patient on day of discharge, and coordination of discharge planning with nursing, social work, pharmacy and case  management  Signed: Ephriam Hashimoto, MD [facility] 11/16/2023

## 2023-11-16 NOTE — TOC Transition Note (Signed)
 Transition of Care [facility]) - Discharge Note   Patient Details  Name: Elizabeth Morton MRN: [id number] Date of Birth: [birth year]  Transition of Care [facility]) CM/SW Contact:  Delilah Fend, LCSW Phone Number: 11/16/2023, 10:20 AM   Clinical Narrative:     Met with pt to review dc recommendations for RW and HHPT.  Pt does not feel she needs HH follow up and notes, "I think it's up to me to get up and moving."  Agreeable with RW and no DME agency preference.  RW ordered from [employer] for delivery to room today.  No further TOC needs.  Final next level of care: Home/Self Care Barriers to Discharge: Barriers Resolved   Patient Goals and CMS Choice Patient states their goals for this hospitalization and ongoing recovery are:: return home          Discharge Placement                       Discharge Plan and Services Additional resources added to the After Visit Summary for                  DME Arranged: Walker rolling DME Agency: [employer] Date DME Agency Contacted: 11/16/23 Time DME Agency Contacted: 1020 Representative spoke with at DME Agency: Gladys Lamp            Social Drivers of Health (SDOH) Interventions SDOH Screenings   Food Insecurity: No Food Insecurity (11/08/2023)  Housing: Low Risk  (11/08/2023)  Transportation Needs: No Transportation Needs (11/08/2023)  Utilities: Not At Risk (11/08/2023)  Depression (PHQ2-9): Low Risk  (02/09/2019)  Social Connections: Moderately Integrated (11/09/2023)  Tobacco Use: Medium Risk (11/09/2023)     Readmission Risk Interventions    11/09/2023   11:07 AM  Readmission Risk Prevention Plan  Post Dischage Appt Complete  Medication Screening Complete  Transportation Screening Complete

## 2023-11-16 NOTE — Care Management Important Message (Signed)
 Important Message  Patient Details IM Letter given to the Patient Name: Elizabeth Morton MRN: [id number] Date of Birth: Feb 25, 1956   Important Message Given:  Yes - Medicare IM     [name] 11/16/2023, 11:49 AM

## 2023-11-16 NOTE — Progress Notes (Signed)
 AVS reviewed w/ pt who verbalized an understanding- no other questions at this time - Pt dressed for d/c to home- ride here- primary nurse following up w/ TOC about walker delivery

## 2023-11-16 NOTE — Progress Notes (Signed)
 PHARMACY - ANTICOAGULATION CONSULT NOTE  Pharmacy Consult for IV heparin  + warfarin Indication: History of mech aortic valve   No Known Allergies  Patient Measurements: Height: 5\' 2"  (157.5 cm) Weight: 78.2 kg (172 lb 6.4 oz) IBW/kg (Calculated) : 50.1 HEPARIN  DW (KG): 67.3  Vital Signs: Temp: 98.1 F (36.7 C) (05/06 0552) Temp Source: Oral (05/06 0552) BP: 121/59 (05/06 0552) Pulse Rate: 78 (05/06 0552)  Labs: Recent Labs    11/14/23 0543 11/14/23 1247 11/14/23 2146 11/15/23 0458 11/16/23 0514  HGB 11.1*  --   --  9.9* 10.3*  HCT 35.2*  --   --  30.7* 33.6*  PLT 221  --   --  225 274  LABPROT 19.2*  --   --  20.8* 22.7*  INR 1.6*  --   --  1.8* 2.0*  HEPARINUNFRC 0.27*   < > 0.36 0.30 0.43  CREATININE 1.70*  --   --  1.22* 1.25*   < > = values in this interval not displayed.    Estimated Creatinine Clearance: 42.8 mL/min (A) (by C-G formula based on SCr of 1.25 mg/dL (H)).  Medications:  Medications Prior to Admission  Medication Sig Dispense Refill   acetaminophen  (TYLENOL ) 500 MG tablet Take 500-1,000 mg by mouth every 6 (six) hours as needed for headache, moderate pain (pain score 4-6) or mild pain (pain score 1-3) (Sleep).     allopurinol  (ZYLOPRIM ) 100 MG tablet Take 100 mg by mouth daily.     Calcium  Carbonate-Vitamin D 600-400 MG-UNIT tablet Take 1 tablet by mouth 2 (two) times daily.     colchicine  0.6 MG tablet Take 1 tablet (0.6 mg total) by mouth daily for 3 doses. Take 2 tablets right away, take the 2nd dose 1.5 hours later (Patient taking differently: Take 0.6 mg by mouth See admin instructions.  For flare-up Take 2 tablets right away, take the 2nd dose 1.5 hours later) 3 tablet 0   furosemide  (LASIX ) 20 MG tablet Take 20 mg by mouth daily.  5   levothyroxine  (SYNTHROID ) 75 MCG tablet Take 75 mcg by mouth daily before breakfast.     losartan  (COZAAR ) 25 MG tablet Take 50 mg by mouth daily.     Multiple Vitamins-Minerals (PRESERVISION/LUTEIN PO) Take 1  capsule by mouth daily with breakfast.      naphazoline-glycerin (CLEAR EYES REDNESS) 0.012-0.25 % SOLN Place 1-2 drops into both eyes 4 (four) times daily as needed for eye irritation.     pantoprazole  (PROTONIX ) 40 MG tablet Take 40 mg by mouth daily.     rosuvastatin  (CRESTOR ) 20 MG tablet Take 20 mg by mouth daily.     warfarin (COUMADIN ) 3 MG tablet TAKE 1 TABLET DAILY EXCEPT 1/2 TABLET ON SUNDAYS OR AS DIRECTED BY ANTICOAGULATION CLINIC. 90 tablet 1    Assessment: Pharmacy consulted to manage IV heparin  for this 68 yo female who was on warfarin prior to admission for a mechanical aortic valve. Warfarin held for hernia repair surgery, completed 5/1. Resuming warfarin per Pharmacy on 5/2.  Prior anticoagulation: warfarin 3 mg daily, except 1.5 mg on Sunday; last dose 4/25. Reported to have a history of bleeding on Lovenox .  Today, 11/16/2023: Heparin  level 0.43 - therapeutic on heparin  at 1100 units/hr INR 2 - therapeutic CBC stable No complications of therapy noted    Goal of Therapy: Heparin  level 0.3-0.7 units/ml INR 2-3 (confirmed in [facility] clinic documentation) Monitor platelets by anticoagulation protocol: Yes   Plan: Continue heparin  infusion at 1100 units/hr  Continue with home dose of warfarin - 3 mg today If patient discharges, would recommend continuing home dose of warfarin with close INR f/u outpatient within one week Daily INR, HL, and CBC Monitor for signs of bleeding or thrombosis Recommended duration of heparin -warfarin overlap is 5 days AND until at least 2 consecutive therapeutic INRs achieved. Length of therapy at discretion of provider.   Lolita Rise, PharmD, BCPS Clinical Pharmacist 11/16/2023 9:00 AM

## 2023-11-16 NOTE — Progress Notes (Signed)
 11/16/2023  Elizabeth Morton [id number] 1956/05/09  CARE TEAM: PCP: Azalia Leo, MD  Outpatient Care Team: Patient Care Team: Azalia Leo, MD as PCP - General (Internal Medicine) Candyce Champagne, MD as Attending Physician (General Surgery) Elmyra Haggard, MD as Consulting Physician (Cardiology) Armbruster, Lendon Queen, MD as Consulting Physician (Gastroenterology)  Inpatient Treatment Team: Treatment Team:  Ephriam Hashimoto, MD Candyce Champagne, MD Verlin Glory, MD Consuela Denier, RN Wright, [place] E, LPN Lincoln Renshaw [place] M   Problem List:   Principal Problem:   Recurrent ventral hernia Active Problems:   Recurrent incisional hernia with incarceration   Hypertension   Hypothyroidism   S/P repair of ventral hernia Nov 2017   Acute respiratory failure with hypoxia (HCC)   Obesity   Stage 3a chronic kidney disease (HCC)   11/11/2023  POST-OPERATIVE DIAGNOSIS:  VENTRAL INCISIONAL RECURRENT AND INCARCERATED  Dimensions of hernia post-op:  18cm x 7cm   PROCEDURE:   LAPAROSCOPIC REPAIR OF ABDOMINAL HERNIA WITH MESH  (See OR Findings below) LAPAROSCOPIC LYSIS OF ADHESIONS REMOVAL OF OLD MESH  TAP BLOCK - BILATERAL   SURGEON:  Eddye Goodie, MD  OR FINDINGS: Umbilical supraumbilical recurrent incisional hernias with old disc of mesh partially herniated into the squishy hernia sac.  Incarcerated the large volume omentum and colon nearby.  Swiss cheese hernias within significant diastases recti with a dominant Subxiphoid hernia 5 x 4 cm containing falciform ligament and omentum. .   Type of ventral wall repair:  Laparoscopic underlay repair .with Primary repair of largest hernia Placement of mesh: Centrally intraperitoneal with edges tucked into RECTRORECTUS & preperitoneal space Name of mesh: Bard Ventralight dual sided (polypropylene / Seprafilm) Size of mesh: 33x27cm Orientation: Vertical Mesh overlap:  7cm  Assessment [facility] Stay = 8 days) 5 Days  Post-Op    Recovering relatively well    Plan:  Tolerating solid diet.    Multimodal pain control.  Diuresis as tolerated.  Would keep her on the dry side.  Defer to medicine service.    Will ask therapies to come by and help evaluate to help her transition out of bed and make sure she does not need rehab or at least home health care.  Tolerating liquids.  Advance diet.  Anticoagulation for her heart valve.  Defer to medicine.  Okay to start warfarin in the meantime and follow INRs.  Most likely can go home when INR is >2 and cleared by internal medicine since she has had numerous severe readmissions on Lovenox  bridges, hence the need for preadmission and postop monitoring.  -monitor electrolytes & replace as needed  Keep K>4, Mg>2, Phos>3  I updated the patient's status to the patient  Recommendations were made.  Questions were answered.  She expressed understanding & appreciation.  -Disposition:  Disposition:  The patient is from: Home Anticipate discharge to:  Home with Home Health Anticipated Date of Discharge is:  May 5,2025   Barriers to discharge:  Pending Clinical improvement (more likely than not), Therapy assessment & Recommendations pending, and Testing result pending  Patient currently is NOT MEDICALLY STABLE for discharge from the hospital from a surgery standpoint.      I reviewed nursing notes, hospitalist notes, last 24 h vitals and pain scores, last 48 h intake and output, last 24 h labs and trends, and last 24 h imaging results.  I have reviewed this patient's available data, including medical history, events of note, test results, etc as part of my evaluation.  A significant portion of that time was spent in counseling. Care during the described time interval was provided by me.  This care required moderate level of medical decision making.  11/16/2023    Subjective: (Chief complaint)  Less sore Tol PO Many BMs but no incontinence Husband in  room. Wants to go home  Objective:  Vital signs:  Vitals:   11/15/23 1109 11/15/23 1341 11/15/23 2045 11/16/23 0552  BP: (!) 143/75 (!) 122/55 (!) 123/57 (!) 121/59  Pulse: 93 96 83 78  Resp:  18 18 18   Temp: 97.7 F (36.5 C) 97.7 F (36.5 C) 97.8 F (36.6 C) 98.1 F (36.7 C)  TempSrc: Oral Oral Oral Oral  SpO2: 95%  93% 93%  Weight:      Height:        Last BM Date : 11/15/23  Intake/Output   Yesterday:  05/05 0701 - 05/06 0700 In: 1440 [P.O.:1440] Out: 1075 [Urine:1075] This shift:  No intake/output data recorded.  Bowel function:  Flatus: YES  BM:  No  Drain: (No drain)   Physical Exam:  General: Pt awake/alert in no acute distress Eyes: PERRL, normal EOM.  Sclera clear.  No icterus Neuro: CN II-XII intact w/o focal sensory/motor deficits. Lymph: No head/neck/groin lymphadenopathy Psych:  No delerium/psychosis/paranoia.  Oriented x 4 HENT: Normocephalic, Mucus membranes moist.  No thrush Neck: Supple, No tracheal deviation.  No obvious thyromegaly Chest: No pain to chest wall compression.  Good respiratory excursion.  No audible wheezing CV:  Pulses intact.  Regular rhythm.  No major extremity edema MS: Normal AROM mjr joints.  No obvious deformity  Abdomen: Soft.  Nondistended.  Nontender.  No evidence of peritonitis.  No incarcerated hernias.  Ext:   No deformity.  No mjr edema.  No cyanosis Skin: No petechiae / purpurea.  No major sores.  Warm and dry    Results:   Cultures: No results found for this or any previous visit (from the past 720 hours).  Labs: Results for orders placed or performed during the hospital encounter of 11/08/23 (from the past 48 hours)  Heparin  level (unfractionated)     Status: Abnormal   Collection Time: 11/14/23 12:47 PM  Result Value Ref Range   Heparin  Unfractionated 0.27 (L) 0.30 - 0.70 IU/mL    Comment: (NOTE) The clinical reportable range upper limit is being lowered to >1.10 to align with the FDA approved  guidance for the current laboratory assay.  If heparin  results are below expected values, and patient dosage has  been confirmed, suggest follow up testing of antithrombin III levels. Performed at [facility], [address] [place], [place] [address]   Heparin  level (unfractionated)     Status: None   Collection Time: 11/14/23  9:46 PM  Result Value Ref Range   Heparin  Unfractionated 0.36 0.30 - 0.70 IU/mL    Comment: (NOTE) The clinical reportable range upper limit is being lowered to >1.10 to align with the FDA approved guidance for the current laboratory assay.  If heparin  results are below expected values, and patient dosage has  been confirmed, suggest follow up testing of antithrombin III levels. Performed at [facility], [address], [place], [place] [address]   CBC     Status: Abnormal   Collection Time: 11/15/23  4:58 AM  Result Value Ref Range   WBC 7.2 4.0 - 10.5 K/uL   RBC 3.26 (L) 3.87 - 5.11 MIL/uL   Hemoglobin 9.9 (L) 12.0 -  15.0 g/dL   HCT 16.1 (L) 09.6 - 04.5 %   MCV 94.2 80.0 - 100.0 fL   MCH 30.4 26.0 - 34.0 pg   MCHC 32.2 30.0 - 36.0 g/dL   RDW 40.9 81.1 - 91.4 %   Platelets 225 150 - 400 K/uL   nRBC 0.0 0.0 - 0.2 %    Comment: Performed at [facility], [address], [place], [place] [address]  Basic metabolic panel with GFR     Status: Abnormal   Collection Time: 11/15/23  4:58 AM  Result Value Ref Range   Sodium 135 135 - 145 mmol/L   Potassium 3.3 (L) 3.5 - 5.1 mmol/L   Chloride 106 98 - 111 mmol/L   CO2 22 22 - 32 mmol/L   Glucose, Bld 112 (H) 70 - 99 mg/dL    Comment: Glucose reference range applies only to samples taken after fasting for at least 8 hours.   BUN 23 8 - 23 mg/dL   Creatinine, Ser 6.21 (H) 0.44 - 1.00 mg/dL   Calcium  8.2 (L) 8.9 - 10.3 mg/dL   GFR, Estimated 49 (L) >60 mL/min    Comment: (NOTE) Calculated using the CKD-EPI Creatinine Equation (2021)    Anion  gap 7 5 - 15    Comment: Performed at [facility], [address], [place], [place] [address]  Protime-INR     Status: Abnormal   Collection Time: 11/15/23  4:58 AM  Result Value Ref Range   Prothrombin Time 20.8 (H) 11.4 - 15.2 seconds   INR 1.8 (H) 0.8 - 1.2    Comment: (NOTE) INR goal varies based on device and disease states. Performed at [facility], [address], [place], [place] [address]   Heparin  level (unfractionated)     Status: None   Collection Time: 11/15/23  4:58 AM  Result Value Ref Range   Heparin  Unfractionated 0.30 0.30 - 0.70 IU/mL    Comment: (NOTE) The clinical reportable range upper limit is being lowered to >1.10 to align with the FDA approved guidance for the current laboratory assay.  If heparin  results are below expected values, and patient dosage has  been confirmed, suggest follow up testing of antithrombin III levels. Performed at [facility], [address], [place], [place] [address]   Protime-INR     Status: Abnormal   Collection Time: 11/16/23  5:14 AM  Result Value Ref Range   Prothrombin Time 22.7 (H) 11.4 - 15.2 seconds   INR 2.0 (H) 0.8 - 1.2    Comment: (NOTE) INR goal varies based on device and disease states. Performed at [facility], [address] [place], [place] [address]   Heparin  level (unfractionated)     Status: None   Collection Time: 11/16/23  5:14 AM  Result Value Ref Range   Heparin  Unfractionated 0.43 0.30 - 0.70 IU/mL    Comment: (NOTE) The clinical reportable range upper limit is being lowered to >1.10 to align with the FDA approved guidance for the current laboratory assay.  If heparin  results are below expected values, and patient dosage has  been confirmed, suggest follow up testing of antithrombin III levels. Performed at [facility], [address] [place], [place] [address]   CBC     Status: Abnormal    Collection Time: 11/16/23  5:14 AM  Result Value Ref Range   WBC 6.7 4.0 - 10.5 K/uL   RBC 3.47 (L)  3.87 - 5.11 MIL/uL   Hemoglobin 10.3 (L) 12.0 - 15.0 g/dL   HCT 16.1 (L) 09.6 - 04.5 %   MCV 96.8 80.0 - 100.0 fL   MCH 29.7 26.0 - 34.0 pg   MCHC 30.7 30.0 - 36.0 g/dL   RDW 40.9 81.1 - 91.4 %   Platelets 274 150 - 400 K/uL   nRBC 0.0 0.0 - 0.2 %    Comment: Performed at [facility], [address], [place], [place] [address]  Basic metabolic panel with GFR     Status: Abnormal   Collection Time: 11/16/23  5:14 AM  Result Value Ref Range   Sodium 133 (L) 135 - 145 mmol/L   Potassium 4.1 3.5 - 5.1 mmol/L   Chloride 103 98 - 111 mmol/L   CO2 22 22 - 32 mmol/L   Glucose, Bld 102 (H) 70 - 99 mg/dL    Comment: Glucose reference range applies only to samples taken after fasting for at least 8 hours.   BUN 25 (H) 8 - 23 mg/dL   Creatinine, Ser 6.21 (H) 0.44 - 1.00 mg/dL   Calcium  9.0 8.9 - 10.3 mg/dL   GFR, Estimated 48 (L) >60 mL/min    Comment: (NOTE) Calculated using the CKD-EPI Creatinine Equation (2021)    Anion gap 8 5 - 15    Comment: Performed at [facility], [address] [place], [place] [address]    Imaging / Studies: No results found.   Medications / Allergies: per chart  Antibiotics: Anti-infectives (From admission, onward)    Start     Dose/Rate Route Frequency Ordered Stop   11/11/23 2200  ceFAZolin  (ANCEF ) IVPB 2g/100 mL premix        2 g 200 mL/hr over 30 Minutes Intravenous Every 8 hours 11/11/23 1930 11/12/23 1356   11/11/23 1045  ceFAZolin  (ANCEF ) IVPB 2g/100 mL premix        2 g 200 mL/hr over 30 Minutes Intravenous On call to O.R. 11/09/23 0748 11/11/23 1021   11/11/23 1033  ceFAZolin  (ANCEF ) 2-4 GM/100ML-% IVPB       Note to Pharmacy: Aloha Arnold D: cabinet override      11/11/23 1033 11/11/23 2244         Note: Portions of this report may have been transcribed using voice recognition software.  Every effort was made to ensure accuracy; however, inadvertent computerized transcription errors may be present.   Any transcriptional errors that result from this process are unintentional.    Eddye Goodie, MD, FACS, MASCRS Esophageal, Gastrointestinal & Colorectal Surgery Robotic and Minimally Invasive Surgery  [facility] [address], [address] [place], [place] [address] [phone] Fax [phone] Main  CONTACT INFORMATION: Weekday (9AM-5PM): Call [facility] main office at [phone] Weeknight (5PM-9AM) or Weekend/Holiday: Check EPIC "Web Links" tab & use "AMION" (password " [email or web address]") for General Surgery [facility] coverage  Please, DO NOT use SecureChat  (it is not reliable communication to reach operating surgeons & will lead to a delay in care).   Epic staff messaging available for outptient concerns needing 1-2 business day response.      11/16/2023  7:06 AM

## 2023-11-16 NOTE — Progress Notes (Signed)
 Mobility Specialist - Progress Note   11/16/23 0912  Mobility  Activity Ambulated with assistance in hallway  Level of Assistance Standby assist, set-up cues, supervision of patient - no hands on  Assistive Device Front wheel walker  Distance Ambulated (ft) 250 ft  Activity Response Tolerated well  Mobility Referral Yes  Mobility visit 1 Mobility  Mobility Specialist Start Time (ACUTE ONLY) 0855  Mobility Specialist Stop Time (ACUTE ONLY) 0912  Mobility Specialist Time Calculation (min) (ACUTE ONLY) 17 min   Pt received in bed and agreeable to mobility. No complaints during session. Pt to recliner after session with all needs met.    [facility]

## 2023-11-17 ENCOUNTER — Ambulatory Visit: Attending: Cardiovascular Disease | Admitting: *Deleted

## 2023-11-17 ENCOUNTER — Telehealth: Payer: Self-pay | Admitting: *Deleted

## 2023-11-17 DIAGNOSIS — Z8673 Personal history of transient ischemic attack (TIA), and cerebral infarction without residual deficits: Secondary | ICD-10-CM

## 2023-11-17 DIAGNOSIS — Z952 Presence of prosthetic heart valve: Secondary | ICD-10-CM | POA: Diagnosis not present

## 2023-11-17 DIAGNOSIS — Z5181 Encounter for therapeutic drug level monitoring: Secondary | ICD-10-CM

## 2023-11-17 LAB — POCT INR: INR: 3.1 — AB (ref 2.0–3.0)

## 2023-11-17 NOTE — Telephone Encounter (Signed)
 Called pt since she missed her appt today, there was no answer so left a message for her to call back.

## 2023-11-17 NOTE — Patient Instructions (Addendum)
 Description   Today take 1/2 tablet of warfarin then continue warfarin 1 tablet daily except 1/2 tablet on Sundays. Have some slaw today and keep in your diet. Recheck INR in 1 week. Coumadin  Clinic [phone]

## 2023-11-26 ENCOUNTER — Ambulatory Visit: Attending: Cardiovascular Disease

## 2023-11-26 DIAGNOSIS — Z952 Presence of prosthetic heart valve: Secondary | ICD-10-CM

## 2023-11-26 LAB — POCT INR: INR: 3.1 — AB (ref 2.0–3.0)

## 2023-11-26 NOTE — Patient Instructions (Signed)
 Description   Today take 1/2 tablet of warfarin then continue warfarin 1 tablet daily except 1/2 tablet on Sundays.  Have some slaw today and keep in your diet.  Recheck INR in 2 weeks.  Coumadin  Clinic [phone]

## 2023-12-15 ENCOUNTER — Ambulatory Visit: Attending: Cardiovascular Disease | Admitting: *Deleted

## 2023-12-15 DIAGNOSIS — Z952 Presence of prosthetic heart valve: Secondary | ICD-10-CM | POA: Diagnosis not present

## 2023-12-15 DIAGNOSIS — Z5181 Encounter for therapeutic drug level monitoring: Secondary | ICD-10-CM

## 2023-12-15 DIAGNOSIS — Z8673 Personal history of transient ischemic attack (TIA), and cerebral infarction without residual deficits: Secondary | ICD-10-CM | POA: Diagnosis not present

## 2023-12-15 LAB — POCT INR: INR: 4.4 — AB (ref 2.0–3.0)

## 2023-12-15 NOTE — Patient Instructions (Addendum)
 Description   Do not take any warfarin today and take 1/2 tablet of warfarin tomorrow then START taking warfarin 1 tablet daily except 1/2 tablet on Sundays and Thursdays.  Have some slaw today and keep in your diet.  Recheck INR in 2 weeks.  Coumadin  Clinic [phone]

## 2023-12-21 DIAGNOSIS — E785 Hyperlipidemia, unspecified: Secondary | ICD-10-CM | POA: Diagnosis not present

## 2023-12-21 DIAGNOSIS — I69354 Hemiplegia and hemiparesis following cerebral infarction affecting left non-dominant side: Secondary | ICD-10-CM | POA: Diagnosis not present

## 2023-12-21 DIAGNOSIS — K219 Gastro-esophageal reflux disease without esophagitis: Secondary | ICD-10-CM | POA: Diagnosis not present

## 2023-12-21 DIAGNOSIS — I130 Hypertensive heart and chronic kidney disease with heart failure and stage 1 through stage 4 chronic kidney disease, or unspecified chronic kidney disease: Secondary | ICD-10-CM | POA: Diagnosis not present

## 2023-12-21 DIAGNOSIS — I2510 Atherosclerotic heart disease of native coronary artery without angina pectoris: Secondary | ICD-10-CM | POA: Diagnosis not present

## 2023-12-21 DIAGNOSIS — Z87891 Personal history of nicotine dependence: Secondary | ICD-10-CM | POA: Diagnosis not present

## 2023-12-21 DIAGNOSIS — Z6828 Body mass index (BMI) 28.0-28.9, adult: Secondary | ICD-10-CM | POA: Diagnosis not present

## 2023-12-21 DIAGNOSIS — I5032 Chronic diastolic (congestive) heart failure: Secondary | ICD-10-CM | POA: Diagnosis not present

## 2023-12-21 DIAGNOSIS — N1831 Chronic kidney disease, stage 3a: Secondary | ICD-10-CM | POA: Diagnosis not present

## 2023-12-21 DIAGNOSIS — M810 Age-related osteoporosis without current pathological fracture: Secondary | ICD-10-CM | POA: Diagnosis not present

## 2023-12-27 DIAGNOSIS — Z7901 Long term (current) use of anticoagulants: Secondary | ICD-10-CM | POA: Diagnosis not present

## 2023-12-27 DIAGNOSIS — K430 Incisional hernia with obstruction, without gangrene: Secondary | ICD-10-CM | POA: Diagnosis not present

## 2023-12-27 DIAGNOSIS — Z952 Presence of prosthetic heart valve: Secondary | ICD-10-CM | POA: Diagnosis not present

## 2023-12-29 ENCOUNTER — Ambulatory Visit: Attending: Cardiovascular Disease | Admitting: *Deleted

## 2023-12-29 DIAGNOSIS — Z952 Presence of prosthetic heart valve: Secondary | ICD-10-CM | POA: Diagnosis not present

## 2023-12-29 DIAGNOSIS — Z8673 Personal history of transient ischemic attack (TIA), and cerebral infarction without residual deficits: Secondary | ICD-10-CM | POA: Diagnosis not present

## 2023-12-29 DIAGNOSIS — Z5181 Encounter for therapeutic drug level monitoring: Secondary | ICD-10-CM | POA: Diagnosis not present

## 2023-12-29 LAB — POCT INR: INR: 3.1 — AB (ref 2.0–3.0)

## 2023-12-29 NOTE — Patient Instructions (Signed)
 Description   Today take 1/2 tablet of warfarin then START taking warfarin 1 tablet daily except 1/2 tablet on Sundays, Tuesdays, and Thursdays.  Have some slaw today and keep in your diet.  Recheck INR in 3 weeks.  Coumadin  Clinic [phone]

## 2024-01-11 NOTE — Progress Notes (Signed)
Please see anticoagulation encounter.

## 2024-01-11 NOTE — Progress Notes (Signed)
 Anticoag encounter

## 2024-01-19 ENCOUNTER — Ambulatory Visit: Attending: Cardiovascular Disease | Admitting: *Deleted

## 2024-01-19 DIAGNOSIS — Z952 Presence of prosthetic heart valve: Secondary | ICD-10-CM | POA: Diagnosis not present

## 2024-01-19 DIAGNOSIS — Z8673 Personal history of transient ischemic attack (TIA), and cerebral infarction without residual deficits: Secondary | ICD-10-CM

## 2024-01-19 LAB — POCT INR: INR: 2.6 (ref 2.0–3.0)

## 2024-01-19 NOTE — Patient Instructions (Addendum)
    Description   Continue taking warfarin 1 tablet daily except 1/2 tablet on Sundays, Tuesdays, and Thursdays.  Keep slaw in your diet consistently. Recheck INR in 5 weeks.  Coumadin  Clinic [phone]

## 2024-01-19 NOTE — Progress Notes (Signed)
Please see anticoagulation encounter.

## 2024-02-23 ENCOUNTER — Ambulatory Visit: Attending: Cardiovascular Disease

## 2024-02-23 DIAGNOSIS — Z952 Presence of prosthetic heart valve: Secondary | ICD-10-CM

## 2024-02-23 LAB — POCT INR: INR: 2.2 (ref 2.0–3.0)

## 2024-02-23 NOTE — Progress Notes (Signed)
 INR 2.2; Please see anticoagulation encounter

## 2024-02-23 NOTE — Patient Instructions (Signed)
 Description   Continue taking warfarin 1 tablet daily except 1/2 tablet on Sundays, Tuesdays, and Thursdays.  Keep slaw in your diet consistently. Recheck INR in 6 weeks.  Coumadin  Clinic [phone]

## 2024-04-05 ENCOUNTER — Ambulatory Visit: Attending: Cardiovascular Disease | Admitting: *Deleted

## 2024-04-05 DIAGNOSIS — Z5181 Encounter for therapeutic drug level monitoring: Secondary | ICD-10-CM

## 2024-04-05 DIAGNOSIS — Z952 Presence of prosthetic heart valve: Secondary | ICD-10-CM

## 2024-04-05 DIAGNOSIS — Z8673 Personal history of transient ischemic attack (TIA), and cerebral infarction without residual deficits: Secondary | ICD-10-CM | POA: Diagnosis not present

## 2024-04-05 LAB — POCT INR: INR: 3.2 — AB (ref 2.0–3.0)

## 2024-04-05 NOTE — Progress Notes (Signed)
 Description   INR-3.2; today take 1/2 tablet of warfarin then continue taking warfarin 1 tablet daily except 1/2 tablet on Sundays, Tuesdays, and Thursdays.  Keep slaw in your diet consistently. Recheck INR in 5 weeks.  Coumadin  Clinic [phone]

## 2024-04-05 NOTE — Patient Instructions (Signed)
 Description   INR-3.2; today take 1/2 tablet of warfarin then continue taking warfarin 1 tablet daily except 1/2 tablet on Sundays, Tuesdays, and Thursdays.  Keep slaw in your diet consistently. Recheck INR in 5 weeks.  Coumadin  Clinic [phone]

## 2024-05-06 ENCOUNTER — Other Ambulatory Visit: Payer: Self-pay | Admitting: Internal Medicine

## 2024-05-06 DIAGNOSIS — Z7901 Long term (current) use of anticoagulants: Secondary | ICD-10-CM

## 2024-05-06 DIAGNOSIS — Z952 Presence of prosthetic heart valve: Secondary | ICD-10-CM

## 2024-05-08 NOTE — Telephone Encounter (Signed)
 Warfarin 3mg  refill S/P aortic valve replacement  Last INR 04/05/24 Last OV 06/16/23

## 2024-05-09 ENCOUNTER — Ambulatory Visit: Attending: Cardiovascular Disease | Admitting: *Deleted

## 2024-05-09 DIAGNOSIS — Z8673 Personal history of transient ischemic attack (TIA), and cerebral infarction without residual deficits: Secondary | ICD-10-CM | POA: Diagnosis not present

## 2024-05-09 DIAGNOSIS — I130 Hypertensive heart and chronic kidney disease with heart failure and stage 1 through stage 4 chronic kidney disease, or unspecified chronic kidney disease: Secondary | ICD-10-CM | POA: Diagnosis not present

## 2024-05-09 DIAGNOSIS — Z5181 Encounter for therapeutic drug level monitoring: Secondary | ICD-10-CM | POA: Diagnosis not present

## 2024-05-09 DIAGNOSIS — E039 Hypothyroidism, unspecified: Secondary | ICD-10-CM | POA: Diagnosis not present

## 2024-05-09 DIAGNOSIS — N1831 Chronic kidney disease, stage 3a: Secondary | ICD-10-CM | POA: Diagnosis not present

## 2024-05-09 DIAGNOSIS — Z952 Presence of prosthetic heart valve: Secondary | ICD-10-CM | POA: Diagnosis not present

## 2024-05-09 DIAGNOSIS — Z0189 Encounter for other specified special examinations: Secondary | ICD-10-CM | POA: Diagnosis not present

## 2024-05-09 DIAGNOSIS — M810 Age-related osteoporosis without current pathological fracture: Secondary | ICD-10-CM | POA: Diagnosis not present

## 2024-05-09 DIAGNOSIS — E785 Hyperlipidemia, unspecified: Secondary | ICD-10-CM | POA: Diagnosis not present

## 2024-05-09 DIAGNOSIS — M109 Gout, unspecified: Secondary | ICD-10-CM | POA: Diagnosis not present

## 2024-05-09 DIAGNOSIS — I5032 Chronic diastolic (congestive) heart failure: Secondary | ICD-10-CM | POA: Diagnosis not present

## 2024-05-09 LAB — POCT INR: INR: 2.3 (ref 2.0–3.0)

## 2024-05-09 NOTE — Patient Instructions (Signed)
 Description   INR-2.3; Continue taking warfarin 1 tablet daily except 1/2 tablet on Sundays, Tuesdays, and Thursdays.  Keep slaw in your diet consistently. Recheck INR in 6 weeks.  Coumadin  Clinic [phone]

## 2024-05-09 NOTE — Progress Notes (Signed)
 Description   INR-2.3; Continue taking warfarin 1 tablet daily except 1/2 tablet on Sundays, Tuesdays, and Thursdays.  Keep slaw in your diet consistently. Recheck INR in 6 weeks.  Coumadin  Clinic [phone]

## 2024-05-10 ENCOUNTER — Ambulatory Visit

## 2024-06-21 ENCOUNTER — Ambulatory Visit: Attending: Cardiovascular Disease | Admitting: *Deleted

## 2024-06-21 DIAGNOSIS — Z952 Presence of prosthetic heart valve: Secondary | ICD-10-CM

## 2024-06-21 DIAGNOSIS — Z8673 Personal history of transient ischemic attack (TIA), and cerebral infarction without residual deficits: Secondary | ICD-10-CM | POA: Diagnosis not present

## 2024-06-21 DIAGNOSIS — Z5181 Encounter for therapeutic drug level monitoring: Secondary | ICD-10-CM

## 2024-06-21 LAB — POCT INR: INR: 2 (ref 2.0–3.0)

## 2024-06-21 NOTE — Patient Instructions (Signed)
 Description   INR-2.0; Today take 1.5 tablets then continue taking warfarin 1 tablet daily except 1/2 tablet on Sundays, Tuesdays, and Thursdays.  Keep slaw in your diet consistently. Recheck INR in 6 weeks.  Coumadin  Clinic [phone]

## 2024-06-21 NOTE — Progress Notes (Signed)
 Description   INR-2.0; Today take 1.5 tablets then continue taking warfarin 1 tablet daily except 1/2 tablet on Sundays, Tuesdays, and Thursdays.  Keep slaw in your diet consistently. Recheck INR in 6 weeks.  Coumadin  Clinic [phone]

## 2024-08-02 ENCOUNTER — Ambulatory Visit: Attending: Cardiovascular Disease | Admitting: *Deleted

## 2024-08-02 DIAGNOSIS — Z952 Presence of prosthetic heart valve: Secondary | ICD-10-CM

## 2024-08-02 DIAGNOSIS — Z8673 Personal history of transient ischemic attack (TIA), and cerebral infarction without residual deficits: Secondary | ICD-10-CM

## 2024-08-02 DIAGNOSIS — Z5181 Encounter for therapeutic drug level monitoring: Secondary | ICD-10-CM | POA: Diagnosis not present

## 2024-08-02 LAB — POCT INR: INR: 1.8 — AB (ref 2.0–3.0)

## 2024-08-02 NOTE — Progress Notes (Signed)
 Description   INR-1.8; Today take 1.5 tablets of warfarin then continue taking warfarin 1 tablet daily except 1/2 tablet on Sundays, Tuesdays, and Thursdays.  Keep slaw in your diet consistently. Recheck INR in 5 weeks.  Coumadin  Clinic [phone]

## 2024-08-02 NOTE — Patient Instructions (Signed)
 Description   INR-1.8; Today take 1.5 tablets of warfarin then continue taking warfarin 1 tablet daily except 1/2 tablet on Sundays, Tuesdays, and Thursdays.  Keep slaw in your diet consistently. Recheck INR in 5 weeks.  Coumadin  Clinic [phone]

## 2024-09-05 ENCOUNTER — Ambulatory Visit
